# Patient Record
Sex: Female | Born: 1935 | Race: White | Hispanic: No | Smoking: Never smoker
Health system: Southern US, Community
[De-identification: ages and names within clinical notes are randomized; demographics above are authoritative.]

## PROBLEM LIST (undated history)

## (undated) DIAGNOSIS — M5136 Other intervertebral disc degeneration, lumbar region: Secondary | ICD-10-CM

## (undated) DIAGNOSIS — M51369 Other intervertebral disc degeneration, lumbar region without mention of lumbar back pain or lower extremity pain: Secondary | ICD-10-CM

## (undated) DIAGNOSIS — I10 Essential (primary) hypertension: Secondary | ICD-10-CM

## (undated) DIAGNOSIS — N840 Polyp of corpus uteri: Secondary | ICD-10-CM

## (undated) DIAGNOSIS — N841 Polyp of cervix uteri: Secondary | ICD-10-CM

## (undated) DIAGNOSIS — K5790 Diverticulosis of intestine, part unspecified, without perforation or abscess without bleeding: Secondary | ICD-10-CM

## (undated) DIAGNOSIS — Z860101 Personal history of adenomatous and serrated colon polyps: Secondary | ICD-10-CM

## (undated) DIAGNOSIS — Z86010 Personal history of colonic polyps: Secondary | ICD-10-CM

## (undated) DIAGNOSIS — E871 Hypo-osmolality and hyponatremia: Secondary | ICD-10-CM

## (undated) DIAGNOSIS — K219 Gastro-esophageal reflux disease without esophagitis: Secondary | ICD-10-CM

## (undated) DIAGNOSIS — E785 Hyperlipidemia, unspecified: Secondary | ICD-10-CM

## (undated) DIAGNOSIS — K449 Diaphragmatic hernia without obstruction or gangrene: Secondary | ICD-10-CM

## (undated) HISTORY — DX: Personal history of adenomatous and serrated colon polyps: Z86.0101

## (undated) HISTORY — DX: Essential (primary) hypertension: I10

## (undated) HISTORY — PX: TONSILLECTOMY: SUR1361

## (undated) HISTORY — PX: DILATION AND CURETTAGE OF UTERUS: SHX78

## (undated) HISTORY — DX: Other intervertebral disc degeneration, lumbar region without mention of lumbar back pain or lower extremity pain: M51.369

## (undated) HISTORY — DX: Diaphragmatic hernia without obstruction or gangrene: K44.9

## (undated) HISTORY — PX: HYSTEROSCOPY: SHX211

## (undated) HISTORY — DX: Hypo-osmolality and hyponatremia: E87.1

## (undated) HISTORY — DX: Polyp of cervix uteri: N84.1

## (undated) HISTORY — DX: Hyperlipidemia, unspecified: E78.5

## (undated) HISTORY — PX: CHOLECYSTECTOMY: SHX55

## (undated) HISTORY — DX: Diverticulosis of intestine, part unspecified, without perforation or abscess without bleeding: K57.90

## (undated) HISTORY — DX: Polyp of corpus uteri: N84.0

## (undated) HISTORY — DX: Personal history of colonic polyps: Z86.010

## (undated) HISTORY — DX: Other intervertebral disc degeneration, lumbar region: M51.36

## (undated) HISTORY — DX: Gastro-esophageal reflux disease without esophagitis: K21.9

---

## 1999-12-08 ENCOUNTER — Ambulatory Visit: Admission: RE | Admit: 1999-12-08 | Discharge: 1999-12-08 | Payer: Self-pay | Admitting: Internal Medicine

## 2000-03-21 ENCOUNTER — Encounter: Payer: Self-pay | Admitting: Internal Medicine

## 2000-03-21 ENCOUNTER — Encounter: Admission: RE | Admit: 2000-03-21 | Discharge: 2000-03-21 | Payer: Self-pay | Admitting: Internal Medicine

## 2000-10-07 ENCOUNTER — Encounter: Admission: RE | Admit: 2000-10-07 | Discharge: 2001-01-05 | Payer: Self-pay | Admitting: Internal Medicine

## 2001-05-02 ENCOUNTER — Encounter: Payer: Self-pay | Admitting: Internal Medicine

## 2001-05-02 ENCOUNTER — Encounter: Admission: RE | Admit: 2001-05-02 | Discharge: 2001-05-02 | Payer: Self-pay | Admitting: Internal Medicine

## 2001-10-15 ENCOUNTER — Other Ambulatory Visit: Admission: RE | Admit: 2001-10-15 | Discharge: 2001-10-15 | Payer: Self-pay | Admitting: Obstetrics and Gynecology

## 2002-07-15 ENCOUNTER — Encounter: Admission: RE | Admit: 2002-07-15 | Discharge: 2002-07-15 | Payer: Self-pay | Admitting: Family Medicine

## 2002-07-15 ENCOUNTER — Encounter: Payer: Self-pay | Admitting: Family Medicine

## 2002-12-10 ENCOUNTER — Other Ambulatory Visit: Admission: RE | Admit: 2002-12-10 | Discharge: 2002-12-10 | Payer: Self-pay | Admitting: Obstetrics and Gynecology

## 2003-07-16 ENCOUNTER — Encounter: Admission: RE | Admit: 2003-07-16 | Discharge: 2003-07-16 | Payer: Self-pay | Admitting: Obstetrics and Gynecology

## 2003-10-28 ENCOUNTER — Inpatient Hospital Stay (HOSPITAL_COMMUNITY): Admission: EM | Admit: 2003-10-28 | Discharge: 2003-11-01 | Payer: Self-pay | Admitting: Emergency Medicine

## 2004-01-25 ENCOUNTER — Other Ambulatory Visit: Admission: RE | Admit: 2004-01-25 | Discharge: 2004-01-25 | Payer: Self-pay | Admitting: Obstetrics and Gynecology

## 2004-05-23 ENCOUNTER — Ambulatory Visit (HOSPITAL_COMMUNITY): Admission: RE | Admit: 2004-05-23 | Discharge: 2004-05-23 | Payer: Self-pay | Admitting: Family Medicine

## 2004-08-23 ENCOUNTER — Encounter: Admission: RE | Admit: 2004-08-23 | Discharge: 2004-08-23 | Payer: Self-pay | Admitting: Family Medicine

## 2004-09-22 ENCOUNTER — Ambulatory Visit (HOSPITAL_COMMUNITY): Admission: RE | Admit: 2004-09-22 | Discharge: 2004-09-22 | Payer: Self-pay | Admitting: Family Medicine

## 2004-12-07 ENCOUNTER — Ambulatory Visit: Payer: Self-pay | Admitting: Internal Medicine

## 2004-12-21 ENCOUNTER — Ambulatory Visit: Payer: Self-pay | Admitting: Internal Medicine

## 2005-02-07 ENCOUNTER — Other Ambulatory Visit: Admission: RE | Admit: 2005-02-07 | Discharge: 2005-02-07 | Payer: Self-pay | Admitting: Obstetrics and Gynecology

## 2005-08-08 ENCOUNTER — Ambulatory Visit: Admission: RE | Admit: 2005-08-08 | Discharge: 2005-08-08 | Payer: Self-pay | Admitting: Family Medicine

## 2005-09-19 ENCOUNTER — Encounter: Admission: RE | Admit: 2005-09-19 | Discharge: 2005-09-19 | Payer: Self-pay | Admitting: Family Medicine

## 2006-02-12 ENCOUNTER — Other Ambulatory Visit: Admission: RE | Admit: 2006-02-12 | Discharge: 2006-02-12 | Payer: Self-pay | Admitting: Obstetrics and Gynecology

## 2007-07-16 ENCOUNTER — Encounter: Admission: RE | Admit: 2007-07-16 | Discharge: 2007-07-16 | Payer: Self-pay | Admitting: Family Medicine

## 2008-04-29 ENCOUNTER — Other Ambulatory Visit: Admission: RE | Admit: 2008-04-29 | Discharge: 2008-04-29 | Payer: Self-pay | Admitting: Obstetrics and Gynecology

## 2008-06-01 ENCOUNTER — Ambulatory Visit: Payer: Self-pay | Admitting: Obstetrics and Gynecology

## 2008-06-03 ENCOUNTER — Encounter: Payer: Self-pay | Admitting: Obstetrics and Gynecology

## 2008-06-03 ENCOUNTER — Ambulatory Visit (HOSPITAL_BASED_OUTPATIENT_CLINIC_OR_DEPARTMENT_OTHER): Admission: RE | Admit: 2008-06-03 | Discharge: 2008-06-03 | Payer: Self-pay | Admitting: Obstetrics and Gynecology

## 2008-06-10 ENCOUNTER — Ambulatory Visit: Payer: Self-pay | Admitting: Obstetrics and Gynecology

## 2008-08-17 ENCOUNTER — Encounter: Admission: RE | Admit: 2008-08-17 | Discharge: 2008-08-17 | Payer: Self-pay | Admitting: Obstetrics and Gynecology

## 2009-06-24 ENCOUNTER — Ambulatory Visit: Payer: Self-pay | Admitting: Obstetrics and Gynecology

## 2009-06-24 ENCOUNTER — Other Ambulatory Visit: Admission: RE | Admit: 2009-06-24 | Discharge: 2009-06-24 | Payer: Self-pay | Admitting: Obstetrics and Gynecology

## 2009-06-24 ENCOUNTER — Encounter: Payer: Self-pay | Admitting: Obstetrics and Gynecology

## 2009-06-27 ENCOUNTER — Ambulatory Visit: Payer: Self-pay | Admitting: Obstetrics and Gynecology

## 2009-08-25 ENCOUNTER — Encounter: Admission: RE | Admit: 2009-08-25 | Discharge: 2009-08-25 | Payer: Self-pay | Admitting: Family Medicine

## 2009-12-02 ENCOUNTER — Encounter (INDEPENDENT_AMBULATORY_CARE_PROVIDER_SITE_OTHER): Payer: Self-pay | Admitting: *Deleted

## 2009-12-19 ENCOUNTER — Encounter (INDEPENDENT_AMBULATORY_CARE_PROVIDER_SITE_OTHER): Payer: Self-pay | Admitting: *Deleted

## 2010-01-09 ENCOUNTER — Encounter (INDEPENDENT_AMBULATORY_CARE_PROVIDER_SITE_OTHER): Payer: Self-pay | Admitting: *Deleted

## 2010-01-11 ENCOUNTER — Ambulatory Visit: Payer: Self-pay | Admitting: Internal Medicine

## 2010-01-11 ENCOUNTER — Encounter (INDEPENDENT_AMBULATORY_CARE_PROVIDER_SITE_OTHER): Payer: Self-pay | Admitting: *Deleted

## 2010-01-25 ENCOUNTER — Ambulatory Visit: Payer: Self-pay | Admitting: Internal Medicine

## 2010-02-15 ENCOUNTER — Ambulatory Visit: Payer: Self-pay | Admitting: Obstetrics and Gynecology

## 2010-04-11 ENCOUNTER — Ambulatory Visit: Payer: Self-pay | Admitting: Obstetrics and Gynecology

## 2010-08-29 ENCOUNTER — Encounter
Admission: RE | Admit: 2010-08-29 | Discharge: 2010-08-29 | Payer: Self-pay | Source: Home / Self Care | Attending: Obstetrics and Gynecology | Admitting: Obstetrics and Gynecology

## 2010-10-08 ENCOUNTER — Encounter: Payer: Self-pay | Admitting: Family Medicine

## 2010-10-12 ENCOUNTER — Ambulatory Visit
Admission: RE | Admit: 2010-10-12 | Discharge: 2010-10-12 | Payer: Self-pay | Source: Home / Self Care | Attending: Obstetrics and Gynecology | Admitting: Obstetrics and Gynecology

## 2010-10-17 NOTE — Letter (Signed)
Summary: Diabetic Instructions  [facility]  [address] [place], [place] [address]   Phone: [phone]  Fax: [phone]    [name] [name] 12-24-1934 MRN: [id number]   _  _   ORAL DIABETIC MEDICATION INSTRUCTIONS  The day before your procedure:   Take your diabetic pill as you do normally  The day of your procedure:   Do not take your diabetic pill    We will check your blood sugar levels during the admission process and again in Recovery before discharging you home  ________________________________________________________________________

## 2010-10-17 NOTE — Miscellaneous (Signed)
Summary: [facility] PV  Clinical Lists Changes  Medications: Added new medication of MIRALAX   POWD (POLYETHYLENE GLYCOL 3350) As per prep  instructions. - Signed Added new medication of REGLAN 10 MG  TABS (METOCLOPRAMIDE HCL) As per prep instructions. - Signed Added new medication of DULCOLAX 5 MG  TBEC (BISACODYL) Day before procedure take 2 at 3pm and 2 at 8pm. - Signed Rx of MIRALAX   POWD (POLYETHYLENE GLYCOL 3350) As per prep  instructions.;  #255gm x 0;  Signed;  Entered by: Ezra Sites RN;  Authorized by: Hart Carwin MD;  Method used: [facility] [phone]*, [address], [place], [place], [place]  [address], Ph: [phone], Fax: [phone] Rx of REGLAN 10 MG  TABS (METOCLOPRAMIDE HCL) As per prep instructions.;  #2 x 0;  Signed;  Entered by: Ezra Sites RN;  Authorized by: Hart Carwin MD;  Method used: Electronically to [facility] [phone]*, [address], [place], [place], [place]  [address], Ph: [phone], Fax: [phone] Rx of DULCOLAX 5 MG  TBEC (BISACODYL) Day before procedure take 2 at 3pm and 2 at 8pm.;  #4 x 0;  Signed;  Entered by: Ezra Sites RN;  Authorized by: Hart Carwin MD;  Method used: Electronically to [facility] [phone]*, [address], [place], [place], [place]  [address], Ph: [phone], Fax: [phone] Allergies: Added new allergy or adverse reaction of PREDNISONE Observations: Added new observation of NKA: F (01/11/2010 13:39)    Prescriptions: DULCOLAX 5 MG  TBEC (BISACODYL) Day before procedure take 2 at 3pm and 2 at 8pm.  #4 x 0   Entered by:   Ezra Sites RN   Authorized by:   Hart Carwin MD   Signed by:   Ezra Sites RN on 01/11/2010   Method used:   Electronically to        [facility] [phone]* (retail)       [address]       [place], [place]  [address]       Ph: [phone]       Fax: [phone]   RxID:   [id number] REGLAN 10 MG  TABS  (METOCLOPRAMIDE HCL) As per prep instructions.  #2 x 0   Entered by:   Ezra Sites RN   Authorized by:   Hart Carwin MD   Signed by:   Ezra Sites RN on 01/11/2010   Method used:   Electronically to        [facility] [phone]* (retail)       [address]       [place], [place]  [address]       Ph: [phone]       Fax: [phone]   RxID:   [id number] MIRALAX   POWD (POLYETHYLENE GLYCOL 3350) As per prep  instructions.  #255gm x 0   Entered by:   Ezra Sites RN   Authorized by:   Hart Carwin MD   Signed by:   Ezra Sites RN on 01/11/2010   Method used:   Electronically to        [facility] [phone]* (retail)       [address]       [place], [place]  [address]       Ph: [phone]  Fax: [phone]   RxID:   [id number]

## 2010-10-17 NOTE — Letter (Signed)
Summary: [facility] Instructions  [facility]  [address] [place], [place] [address]   Phone: [phone]  Fax: [phone]       [name] [name]    10/22/1934    MRN: [id number]       Procedure Day Dorna Bloom: Wednesday 01/25/2010     Arrival Time: 8:30 am      Procedure Time: 9:30 am     Location of Procedure:                    _x _  [facility] (4th Floor)    PREPARATION FOR COLONOSCOPY WITH MIRALAX  Starting 5 days prior to your procedure Friday 5/6 do not eat nuts, seeds, popcorn, corn, beans, peas,  salads, or any raw vegetables.  Do not take any fiber supplements (e.g. Metamucil, Citrucel, and Benefiber). ____________________________________________________________________________________________________   THE DAY BEFORE YOUR PROCEDURE         DATE: Tuesday 5/10  1   Drink clear liquids the entire day-NO SOLID FOOD  2   Do not drink anything colored red or purple.  Avoid juices with pulp.  No orange juice.  3   Drink at least 64 oz. (8 glasses) of fluid/clear liquids during the day to prevent dehydration and help the prep work efficiently.  CLEAR LIQUIDS INCLUDE: Water Jello Ice Popsicles Tea (sugar ok, no milk/cream) Powdered fruit flavored drinks Coffee (sugar ok, no milk/cream) Gatorade Juice: apple, white grape, white cranberry  Lemonade Clear bullion, consomm, broth Carbonated beverages (any kind) Strained chicken noodle soup Hard Candy  4   Mix the entire bottle of Miralax with 64 oz. of Gatorade/Powerade in the morning and put in the refrigerator to chill.  5   At 3:00 pm take 2 Dulcolax/Bisacodyl tablets.  6   At 4:30 pm take one Reglan/Metoclopramide tablet.  7  Starting at 5:00 pm drink one 8 oz glass of the Miralax mixture every 15-20 minutes until you have finished drinking the entire 64 oz.  You should finish drinking prep around 7:30 or 8:00 pm.  8   If you are nauseated, you may take the 2nd Reglan/Metoclopramide  tablet at 6:30 pm.        9    At 8:00 pm take 2 more DULCOLAX/Bisacodyl tablets.     THE DAY OF YOUR PROCEDURE      DATE: Wednesday 5/11  You may drink clear liquids until 7:30 am   (2 HOURS BEFORE PROCEDURE).   MEDICATION INSTRUCTIONS  Unless otherwise instructed, you should take regular prescription medications with a small sip of water as early as possible the morning of your procedure.  Diabetic patients - see separate instructions.  Additional medication instructions: Do not take Bisoprolol/HCTZ  the day of your procedure.         OTHER INSTRUCTIONS  You will need a responsible adult at least 74 years of age to accompany you and drive you home.   This person must remain in the waiting room during your procedure.  Wear loose fitting clothing that is easily removed.  Leave jewelry and other valuables at home.  However, you may wish to bring a book to read or an iPod/MP3 player to listen to music as you wait for your procedure to start.  Remove all body piercing jewelry and leave at home.  Total time from sign-in until discharge is approximately 2-3 hours.  You should go home directly after your procedure and rest.  You can resume normal  activities the day after your procedure.  The day of your procedure you should not:   Drive   Make legal decisions   Operate machinery   Drink alcohol   Return to work  You will receive specific instructions about eating, activities and medications before you leave.   The above instructions have been reviewed and explained to me by  Ezra Sites RN  January 11, 2010 2:05 PM     I fully understand and can verbalize these instructions _____________________________ Date _______

## 2010-10-17 NOTE — Letter (Signed)
Summary: Colonoscopy Letter  [facility]  [address] [place], [place] [address]   Phone: [phone]  Fax: [phone]      December 02, 2009 MRN: [id number]   [name] [name] [address] [place], [place]  [address]   Dear Ms. [facility],   According to your medical record, it is time for you to schedule a Colonoscopy. The American Cancer Society recommends this procedure as a method to detect early colon cancer. Patients with a family history of colon cancer, or a personal history of colon polyps or inflammatory bowel disease are at increased risk.  This letter has beeen generated based on the recommendations made at the time of your procedure. If you feel that in your particular situation this may no longer apply, please contact our office.  Please call our office at [phone] to schedule this appointment or to update your records at your earliest convenience.  Thank you for cooperating with [place] to provide you with the very best care possible.   Sincerely,  Hedwig Morton. Juanda Chance, M.D.  [facility] Gastroenterology Division [phone]

## 2010-10-17 NOTE — Letter (Signed)
Summary: Previsit letter  [facility]  [address] [place], [place] [address]   Phone: [phone]  Fax: [phone]       12/19/2009 MRN: [id number]  [name] [name] [address] [place], [place]  [address]  Dear Ms. [facility],  Welcome to the Gastroenterology Division at [facility].    You are scheduled to see a nurse for your pre-procedure visit on 01-11-10 at [facility] on the 3rd floor at [facility], [address]  We ask that you try to arrive at our office 15 minutes prior to your appointment time to allow for check-in.  Your nurse visit will consist of discussing your medical and surgical history, your immediate family medical history, and your medications.    Please bring a complete list of all your medications or, if you prefer, bring the medication bottles and we will list them.  We will need to be aware of both prescribed and over the counter drugs.  We will need to know exact dosage information as well.  If you are on blood thinners (Coumadin, Plavix, Aggrenox, Ticlid, etc.) please call our office today/prior to your appointment, as we need to consult with your physician about holding your medication.   Please be prepared to read and sign documents such as consent forms, a financial agreement, and acknowledgement forms.  If necessary, and with your consent, a friend or relative is welcome to sit-in on the nurse visit with you.  Please bring your insurance card so that we may make a copy of it.  If your insurance requires a referral to see a specialist, please bring your referral form from your primary care physician.  No co-pay is required for this nurse visit.     If you cannot keep your appointment, please call [phone] to cancel or reschedule prior to your appointment date.  This allows [place] the opportunity to schedule an appointment for another patient in need of care.    Thank you for choosing [facility] for your medical needs.  We  appreciate the opportunity to care for you.  Please visit [place] at our website  to learn more about our practice.                     Sincerely.                                                                                                                   The Gastroenterology Division

## 2010-10-17 NOTE — Procedures (Signed)
Summary: Colonoscopy  Patient: [name] Note: All result statuses are Final unless otherwise noted.  Tests: (1) Colonoscopy (COL)   COL Colonoscopy           DONE     [facility]     [address]     [place], [place]  [address]           COLONOSCOPY PROCEDURE REPORT           PATIENT:  [name], [name]  MR#:  [id number]     BIRTHDATE:  27-Feb-1935, 74 yrs. old  GENDER:  female     ENDOSCOPIST:  Hedwig Morton. Juanda Chance, MD     REF. BY:  Holley Bouche, M.D.     PROCEDURE DATE:  01/25/2010     PROCEDURE:  Colonoscopy 19[address]     ASA CLASS:  Class I     INDICATIONS:  surveillance prior colon. [id number], tubular     adenomas     MEDICATIONS:   Versed 5 mg, Fentanyl 50 mcg           DESCRIPTION OF PROCEDURE:   After the risks benefits and     alternatives of the procedure were thoroughly explained, informed     consent was obtained.  Digital rectal exam was performed and     revealed no rectal masses.   The LB [id number]-[id number] endoscope     was introduced through the anus and advanced to the cecum, which     was identified by both the appendix and ileocecal valve, without     limitations.  The quality of the prep was adequate, using MiraLax.     The instrument was then slowly withdrawn as the colon was fully     examined.     <<PROCEDUREIMAGES>>           FINDINGS:  Moderate diverticulosis was found throughout the colon     (see image2, image9, and image8).  This was otherwise a normal     examination of the colon (see image10, image5, image4, and     image6).   Retroflexed views in the rectum revealed no     abnormalities.    The scope was then withdrawn from the patient     and the procedure completed.           COMPLICATIONS:  None     ENDOSCOPIC IMPRESSION:     1) Moderate diverticulosis throughout the colon     2) Otherwise normal examination     RECOMMENDATIONS:     1) high fiber diet     fiber supplements daily     REPEAT EXAM:  In 10 year(s) for.       ______________________________     Hedwig Morton. Juanda Chance, MD           CC:           n.     eSIGNED:   Hedwig Morton. Brodie at 01/25/2010 10:08 AM           Celso Amy, [id number]  Note: An exclamation mark (!) indicates a result that was not dispersed into the flowsheet. Document Creation Date: 01/25/2010 10:09 AM _______________________________________________________________________  (1) Order result status: Final Collection or observation date-time: 01/25/2010 10:02 Requested date-time:  Receipt date-time:  Reported date-time:  Referring Physician:   Ordering Physician: Lina Sar [phone]) Specimen Source:  Source: Launa Grill Order Number: [id number] Lab site:   Appended Document: Colonoscopy    Clinical Lists Changes  Observations: Added new observation of COLONNXTDUE: 01/2020 (01/25/2010 14:31)

## 2010-12-05 LAB — GLUCOSE, CAPILLARY: Glucose-Capillary: 205 mg/dL — ABNORMAL HIGH (ref 70–99)

## 2011-01-30 NOTE — Op Note (Signed)
[name] [name], [name] [name]             ACCOUNT NO.:  [id number]   MEDICAL RECORD NO.:  [id number]          PATIENT TYPE:  AMB   LOCATION:  [facility]                         FACILITY:  [facility]   PHYSICIAN:  Daniel L. Gottsegen, M.D.DATE OF BIRTH:  July 07, 1935   DATE OF PROCEDURE:  06/03/2008  DATE OF DISCHARGE:                               OPERATIVE REPORT   PREOPERATIVE DIAGNOSES:  Abnormal Pap smear with endometrial cells  present.   POSTOPERATIVE DIAGNOSES:  Abnormal Pap smear with endometrial cells  present plus endocervical polyp and endometrial polyp.   OPERATION:  Hysteroscopy, endometrial biopsy excision of endocervical  and endometrial polyp, endocervical curettage.   SURGEON:  Daniel L. Eda Paschal, M.D.   ANESTHESIA:  General.   INDICATIONS:  The patient is a 76 year old female who came to the office  for her annual exam and had a Pap smear showing endometrial cells.  She  is postmenopausal.  She has had no bleeding.  She had cervical stenosis,  so she could not be evaluated in the office, but this was obviously an  abnormal finding.  She now enters the hospital for hysteroscopy,  endometrial and endocervical sampling and appropriate surgery.   Please note that her electrocardiogram this morning was not normal.  However, the anesthesiologist thought it was safe to give her general  anesthesia, it was faxed to Holley Bouche, M.D.  His office was called  and he will see whether it is any different from previous  electrocardiograms.   FINDINGS:  External is normal,  BUS is normal.  Vaginal is normal.  Cervix is clean.  Uterus is normal size and shape.  Adnexa failed to  reveal masses.  At the time of hysteroscopy, the patient had a well-  defined endocervical polyp coming off the anterior wall of the  endocervix and a endometrial polyp coming off the left lower wall of the  endometrium.  The endometrial polyp was about 1.5 cm.  The endocervical  polyp was little bit less than a  centimeter.  Other than this, tubal  ostia, top of the fundus, anterior and posterior walls of the fundus,  lower uterine segment and endocervical canal were free of other disease.   PROCEDURE:  After adequate general anesthesia the patient was placed in  the dorsal lithotomy position, prepped and draped in usual sterile  manner.  A single-tooth tenaculum was placed on the anterior lip of the  cervix and the cervix was dilated to a #31 Pratt dilator.  A  hysteroscopic resectoscope was introduced, 3% sorbitol was used to  expand the intrauterine cavity and a camera was used for magnification.  The first thing encountered was an endocervical polyp which could be  excised with a 90 degrees wire loop with appropriate Bovie settings.  The endometrial curettings were obtained, endocervical curettings were  obtained and then when the endometrium had  been entered and the endometrial polyp was encountered which was also  excised with the 90 degrees wire loop.  There was no bleeding noted.  There was no fluid deficit.  The procedure was terminated.  The patient  left the operating room  in satisfactory condition.      Daniel L. Eda Paschal, M.D.  Electronically Signed     DLG/MEDQ  D:  06/03/2008  T:  06/04/2008  Job:  [id number]

## 2011-02-02 NOTE — Discharge Summary (Signed)
NAMECLOYCE, [name] [name]                       ACCOUNT NO.:  [id number]   MEDICAL RECORD NO.:  [id number]                   PATIENT TYPE:  INP   LOCATION:  3729                                 FACILITY:  [facility]   PHYSICIAN:  Kela Millin, M.D.             DATE OF BIRTH:  October 11, 1934   DATE OF ADMISSION:  10/28/2003  DATE OF DISCHARGE:  11/01/2003                                 DISCHARGE SUMMARY   DISCHARGE DIAGNOSES:  1. Chest pain, non-cardiac, myocardial infarction ruled out.  Likely     secondary to gastroesophageal reflux disease.  2. Diabetes mellitus.  3. Dyslipidemia.  4. Hypertension.  5. Gastroesophageal reflux disease.   PROCEDURES/STUDIES:  Stress Cardiolite, negative.  No ischemic changes.   CONSULTATIONS:  Cardiology, Dr. Fraser Din.   HISTORY:  The patient is a 76 year old white female who presented with  complaints of substernal aching chest pain which seemed to radiate across  the left chest to her left arm.  She stated that the pain began at bedtime  and persisted all night and kept her from sleeping.  There was no associated  shortness of breath, diaphoresis, or nausea.  She tried to go to work, but  was too uncomfortable, and therefore presented to Dr. Rennie Plowman office.  After evaluation, she was referred to [facility].  She was  then admitted for further evaluation.  She stated she had not had any  similar episodes in the past, also admitted that she was very physically  inactive.  She also stated that she had an exercise stress test done in the  distant past, and that she was very anxious and afraid about walking on  treadmills.  Her physical examination was within normal limits.  Her cardiac  enzymes were negative, and also her chemistries were within normal limits,  as well as her H&H.  Her EKG showed sinus rhythm with normal R-wave  progression, and there were no acute ischemic changes.  A fasting lipid  profile revealed an LDL of 109, HDL  was 42, triglycerides were 198, and her  total cholesterol was 191.   HOSPITAL COURSE:  #1 -  CHEST PAIN:  The patient was admitted to telemetry  unit and was started on aspirin as well as Lovenox 1 mg/kg, Lopressor, and  nitroglycerin, as well as Altace.  Serial cardiac enzymes were done and as  stated above were negative.  Cardiology was consulted, Dr. Fraser Din, and she  recommended that the patient's dyslipidemia be treated with a goal LDL of  70, and the patient was started on Lipitor.  It was noted that the patient  did not want to be on an anti-lipid agent because she had been advised in  the past of the possible side effects, I discussed the benefits versus the  side effects with her, and upon discharge gave her the prescription and she  was going to consider continuing the Lipitor.  Dr.  Fraser Din also did a stress  Cardiolite and the results are stated above.  #2 -  DYSLIPIDEMIA:  As discussed above.  The patient was also educated on a  low fat, low cholesterol diet.  #3 -  HYPERTENSION:  The patient was treated with Altace while in the  hospital, and upon discharge was to resume her outpatient medications.  #4 -  DIABETES MELLITUS:  Her Accu-Cheks were monitored and she was followed  with sliding scale on admission.  She was on Metformin as an outpatient, but  this was held as it was anticipated that she was going to have a Cardiolite  stress test done.  The Metformin was resumed after her stress test upon  discharge.   DISCHARGE MEDICATIONS:  1. The patient is to continue pre-admission medications.  2. Prevacid increased to 30 mg p.o. b.i.d.  3. Lipitor 20 mg one daily.  4. Continue enteric coated aspirin 81 mg daily.   DISCHARGE DIET:  Diabetic, low fat, low cholesterol.   DISCHARGE CONDITION:  Improved.   FOLLOWUP:  The patient is to follow up with Dr. Dayton Scrape in one to two weeks.                                                Kela Millin, M.D.    ACV/MEDQ  D:   11/13/2003  T:  11/13/2003  Job:  [id number]   cc:   Meredith Staggers, M.D.  [address]  [place]  [place] [address]  Fax: [phone]

## 2011-02-02 NOTE — H&P (Signed)
NAME:  [name], [name]                       ACCOUNT NO.:  [id number]   MEDICAL RECORD NO.:  [id number]                   PATIENT TYPE:  EMS   LOCATION:  MINO                                 FACILITY:  [facility]   PHYSICIAN:  Sherin Quarry, MD                   DATE OF BIRTH:  December 17, 1934   DATE OF ADMISSION:  10/28/2003  DATE OF DISCHARGE:                                HISTORY & PHYSICAL   HISTORY OF PRESENT ILLNESS:  [name] [name] is a very nice 76 year old lady  who reports that yesterday evening at the time of bed time she began to  experience substernal chest aching which seemed to radiate across the left  chest to the left arm.  This pain persisted all night and prevented sleep.  There was no associated shortness of breath, diaphoresis, or nausea.  She  tried to go to work but was too uncomfortable, and therefore presented to  Dr. Janan Ridge office.  After evaluation, she was referred to  [facility].   She is admitted for further evaluation of persistent chest pain.  The  patient states she has not had any similar episodes in the past.  She is  very physically inactive.  She recalls that in the distance past she had an  exercise stress test that was negative. She reports that she is very anxious  and afraid and walking on treadmills.   ALLERGIES:  She is allergic to PREDNISONE.   CURRENT MEDICATIONS:  1. Prevacid 30 me q.d.  2. Glipizide 1 tablet q.d.  3. Metformin b.i.d.  4. Aspirin 81 mg q.d.  5. Meclizine p.r.n.  6. Blood pressure pill.   PAST MEDICAL HISTORY:  1. The patient reports that she has a history of well controlled diabetes.     She is very compliant with her medication.  She does not adhere to her     diet closely.  2. Hypertension. She reports this is well regulated.  There is no history of     stroke.  3. Gastroesophageal reflux disease.  The patient reports that she has     chronic acid reflux which has been  a problem in the past and for which     she takes Prevacid on a daily basis.   PAST SURGICAL HISTORY:  1. Colonoscopy in 1998 and again in 1999 for colon polyps.  2. She has also had a cholecystectomy and tonsillectomy.   FAMILY HISTORY:  Her mother died suddenly of a stroke at age 42.  Her mother  had a history of hypertension.  Her father died of alcohol-related problems.  He ultimately committed suicide. She has two sisters who have a history of  diabetes.   SOCIAL HISTORY:  The patient lives alone.  She does not smoke or drink.  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   REVIEW  OF SYMPTOMS:  She denies headache or vision loss.  She denies visual  blurring or diplopia.  EARS/NOSE/THROAT:  Denies earaches, sinus pain, or  sore throat.  CHEST:  Denies coughing, wheezing, or chest congestion.  CARDIOVASCULAR:  Denies orthopnea, PND, ankle edema, or exertional chest  pain.  GASTROINTESTINAL:  Denies hematemesis or melena.  GENITOURINARY:  Denies dysuria or urinary frequency.  RHEUMATOLOGIC:  Denies back pain or  joint pain.  HEMATOLOGIC:  Denies easy bleeding or bruising.  NEUROLOGIC:  Denies history of seizure or stroke.   PHYSICAL EXAMINATION:  VITAL SIGNS:  Temperature is 97.5, pulse is 76,  respirations 20.  O2 saturation are 97%.  HEENT:  Within normal limits.  CHEST:  Clear to auscultation and percussion.  CARDIOVASCULAR:  Normal S1 and S2 without rubs, murmurs, or gallops.  ABDOMEN:  Within normal limits without masses or tenderness.  There is no  guarding or rebound.  NEUROLOGICAL:  Examination of extremities is normal.   LABORATORY DATA:  Cardiac enzymes are negative x 3.  Sodium is 137,  potassium is 4.2, glucose is 116.  Hemoglobin is 13.2.   Chest x-ray is reported to be within normal limits.   IMPRESSION:  1. Chest pain, rule out myocardial infarction.  2. History of severe gastroesophageal reflux Prevacid therapy.  3. Diabetes.  4.  Hypertension.  5. Obesity.  6. History of colon polyps.  7. Status post cholecystectomy.   PLAN:  The patient will be admitted to rule out myocardial infarction.  A  cardiology consult will be requested in the morning for the purpose of  conducting a stress test.  This will probably need to be an Adenosine-  Cardiolite study.  We will continue her current treatment.  It is possible  that her symptoms might be either secondary to acid reflux or possibly  secondary to a musculoskeletal origin.                                                Sherin Quarry, MD    SY/MEDQ  D:  10/28/2003  T:  10/28/2003  Job:  [id number]   cc:   Meredith Staggers, M.D.  [address]  [place]  [place] [address]  Fax: [phone]   [facility]

## 2011-02-02 NOTE — Consult Note (Signed)
NAMEROSALYND, MCWRIGHT                       ACCOUNT NO.:  [id number]   MEDICAL RECORD NO.:  [id number]                   PATIENT TYPE:  INP   LOCATION:  3729                                 FACILITY:  [facility]   PHYSICIAN:  Meade Maw, M.D.                 DATE OF BIRTH:  12/27/1934   DATE OF CONSULTATION:  10/29/2003  DATE OF DISCHARGE:                                   CONSULTATION   REFERRING:  Leanne Chang, M.D.   REASON FOR CONSULTATION:  Chest pain.   HISTORY:  Sundee is a very-pleasant 76 year old female who first noted an  episode of chest pain on Wednesday night.  This started at bedtime.  He was  described as an aching sensation which radiated to her left chest and left  arm.  She was noted to have left-arm heaviness.  This persisted for two to  three hours.  The patient finally sat upright in a chair and was able to  obtain two to three hours of sleep.  She attempted to go to work the next  day.  The left arm numbness persisted.  She presented to Dr. Rennie Plowman office  who recommended further evaluation in the ER.  She has had similar episodes  of chest pain in the past.  She has had left-arm numbness for approximately  three months.  She does report an exercise stress test approximately 15  years prior.  She had a bad experience with her stress test and is very  anxious requiring treadmills.   MEDICATIONS AT TIME OF ADMISSION:  1. Prevacid 30 mg daily.  2. Glipizide one tablet daily.  3. Metformin b.i.d.  4. Aspirin 81 mg daily.  5. Meclizine p.r.n.  6. Blood pressure medication.   CURRENT MEDICATIONS:  1. Humulin insulin.  2. Lopressor 25 mg b.i.d.  3. Protonix 40 mg daily.  4. Altace 2.5 mg daily.  5. Lovenox 100 mg subcu q.12h.  6. Aspirin 162 mg daily.  7. Tylenol p.r.n.  8. Sublingual nitroglycerin.  9. Ativan p.r.n.   PAST SURGICAL HISTORY:  Significant for a colonoscopy in 1998,  cholecystectomy, tonsillectomy.   PAST MEDICAL HISTORY:  1.  Hypertension.  2. Diabetes mellitus x5 years.  3. Reflux disease.   FAMILY HISTORY:  Significant for mother with a stroke at age 6.  Mother  with hypertension prior to her stroke.  Father had recently died from  suicide.  Two sisters, one who had passed from cerebrovascular accident.   SOCIAL HISTORY:  She lives alone.  There is no history of tobacco or alcohol  use.  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   REVIEW OF SYSTEMS:  She has had no palpitations, no presyncope, no syncope,  no orthopnea, no pedal edema, no upper respiratory infections, no blood in  the stool.   PHYSICAL EXAMINATION:  GENERAL:  An elderly female, appearing older than her  stated age, in no acute distress.  VITAL SIGNS:  Temperature 99.5, blood pressure 150/82, O2 saturations 96%.  Heart rate ranges in the 70s to 80s.  Telemetry has revealed sinus rhythm  without significant arrhythmias.  PULMONARY:  Exam reveals breath sounds which are equal and clear to  auscultation.  No use of accessory muscles.  CARDIOVASCULAR:  Exam reveals a normal S1, normal S2, no rubs, murmurs or  gallops noted.  ABDOMEN:  Soft, benign, nontender.  EXTREMITIES:  No peripheral edema.  SKIN:  Warm and dry.   LABORATORY DATA:  Her LDL is 109, HDL is 42, triglycerides 191.  CK 182, CK-  MB 2.6, relative index 1.4.  Troponin I is 0.01.  White count 5.4.  Hemoglobin 12.8, hematocrit 36, platelets count 278,000.  INR 1.  ECG  reveals a sinus rhythm, normal R wave progression.  No acute ischemic  changes.   IMPRESSION:  21. A 76 year old female with chest pain.  The chest pain has typical and     atypical features.  The left arm numbness has been persistent for     approximately three months.  She has had recurrent chest pain while in     the hospital.  Serial cardiac enzymes have been negative.  No evidence of     ischemia on her ECG.  We will need to schedule for a stress Cardiolite as     an inpatient.  May need  to switch to an adenosine Cardiolite.  2. Dyslipidemia.  LDL is 109.  We will treat for an LDL of 70 in view of her     diabetes and hypertension.  3. Hypertension.  Blood pressure is not well controlled.  Will increase her     Altace to 5 mg daily.                                               Meade Maw, M.D.    HP/MEDQ  D:  10/29/2003  T:  10/30/2003  Job:  [id number]

## 2011-06-18 LAB — GLUCOSE, CAPILLARY: Glucose-Capillary: 213 — ABNORMAL HIGH

## 2011-07-23 ENCOUNTER — Other Ambulatory Visit: Payer: Self-pay | Admitting: Obstetrics and Gynecology

## 2011-07-23 DIAGNOSIS — Z1231 Encounter for screening mammogram for malignant neoplasm of breast: Secondary | ICD-10-CM

## 2011-09-04 ENCOUNTER — Ambulatory Visit
Admission: RE | Admit: 2011-09-04 | Discharge: 2011-09-04 | Disposition: A | Discharge status: Home / Self Care | Payer: Medicare Other | Source: Ambulatory Visit | Attending: Obstetrics and Gynecology | Admitting: Obstetrics and Gynecology

## 2011-09-04 DIAGNOSIS — Z1231 Encounter for screening mammogram for malignant neoplasm of breast: Secondary | ICD-10-CM

## 2012-05-14 ENCOUNTER — Encounter: Payer: Self-pay | Admitting: Gynecology

## 2012-05-14 DIAGNOSIS — E119 Type 2 diabetes mellitus without complications: Secondary | ICD-10-CM | POA: Insufficient documentation

## 2012-05-14 DIAGNOSIS — N840 Polyp of corpus uteri: Secondary | ICD-10-CM | POA: Insufficient documentation

## 2012-05-14 DIAGNOSIS — G4730 Sleep apnea, unspecified: Secondary | ICD-10-CM | POA: Insufficient documentation

## 2012-05-14 DIAGNOSIS — N841 Polyp of cervix uteri: Secondary | ICD-10-CM | POA: Insufficient documentation

## 2012-05-15 ENCOUNTER — Encounter: Payer: Self-pay | Admitting: Obstetrics and Gynecology

## 2012-05-15 ENCOUNTER — Other Ambulatory Visit (HOSPITAL_COMMUNITY)
Admission: RE | Admit: 2012-05-15 | Discharge: 2012-05-15 | Disposition: A | Discharge status: Home / Self Care | Payer: Medicare Other | Source: Ambulatory Visit | Attending: Obstetrics and Gynecology | Admitting: Obstetrics and Gynecology

## 2012-05-15 ENCOUNTER — Ambulatory Visit (INDEPENDENT_AMBULATORY_CARE_PROVIDER_SITE_OTHER): Payer: Medicare Other | Admitting: Obstetrics and Gynecology

## 2012-05-15 VITALS — BP 126/82 | Ht 62.0 in | Wt 182.0 lb

## 2012-05-15 DIAGNOSIS — N816 Rectocele: Secondary | ICD-10-CM

## 2012-05-15 DIAGNOSIS — Z124 Encounter for screening for malignant neoplasm of cervix: Secondary | ICD-10-CM

## 2012-05-15 DIAGNOSIS — N9089 Other specified noninflammatory disorders of vulva and perineum: Secondary | ICD-10-CM

## 2012-05-15 DIAGNOSIS — Z01419 Encounter for gynecological examination (general) (routine) without abnormal findings: Secondary | ICD-10-CM | POA: Insufficient documentation

## 2012-05-15 DIAGNOSIS — N952 Postmenopausal atrophic vaginitis: Secondary | ICD-10-CM

## 2012-05-15 DIAGNOSIS — R3915 Urgency of urination: Secondary | ICD-10-CM

## 2012-05-15 DIAGNOSIS — R350 Frequency of micturition: Secondary | ICD-10-CM

## 2012-05-15 NOTE — Progress Notes (Signed)
Patient came back to see me today for further followup. She continues to have urinary frequency with nocturia x3. She also gets urgency of urination. She does not have urinary stress incontinence. She currently does not have dysuria. She had a normal mammogram this year. She has had several normal bone densities with the last one in 2009. She is doing well now without hormone replacement therapy. She does not have hot flashes. She is not aware of vaginal dryness. She does have atrophic vaginitis. She is not sexually active. She is having no vaginal bleeding or pelvic pain. She did have previous endometrial polyps and we removed in 2009. She has had no bleeding since then. She has always had normal Pap smears. Her last Pap smear was 2010. She is aware of a small growth on her left labia which she feels has increased in size since her last visit. We have also been watching her with a small rectocele which she is asymptomatic.  ROS: 12 system review done. Pertinent positives above. Other positives include sleep apnea, diabetes mellitus.  Physical examination:Kim Julian Reil present. HEENT within normal limits. Neck: Thyroid not large. No masses. Supraclavicular nodes: not enlarged. Breasts: Examined in both sitting and lying  position. No skin changes and no masses. Abdomen: Soft no guarding rebound or masses or hernia. Pelvic: External: Within normal limits except for 2 mm nodule on left labia unchanged from previous exam and somewhat from. Most consistent with folliculitis. BUS: Within normal limits. Vaginal:within normal limits. Poor  estrogen effect. No evidence of cystocele  or enterocele. First-degree rectocele.  Cervix: clean. Uterus: Normal size and shape. Adnexa: No masses. Rectovaginal exam: Confirmatory and negative. Extremities: Within normal limits.  Assessment: #1. Benign stable lesion of left labia #2. Atrophic vaginitis #3. Small rectocele #4. Mild detrussor instability  Plan: Continue yearly  mammograms. Reassured about labial lesion. Call me if it increases in size. Offered  patient medicine for detrussor instability. She declined. The new Pap smear guidelines were discussed with the patient. Patient requested pap which was done.

## 2012-05-15 NOTE — Patient Instructions (Signed)
Continue yearly mammograms 

## 2012-05-26 ENCOUNTER — Encounter: Payer: Self-pay | Admitting: Obstetrics and Gynecology

## 2012-07-29 ENCOUNTER — Other Ambulatory Visit: Payer: Self-pay | Admitting: Family Medicine

## 2012-07-29 DIAGNOSIS — R109 Unspecified abdominal pain: Secondary | ICD-10-CM

## 2012-08-04 ENCOUNTER — Ambulatory Visit
Admission: RE | Admit: 2012-08-04 | Discharge: 2012-08-04 | Disposition: A | Discharge status: Home / Self Care | Payer: Medicare Other | Source: Ambulatory Visit | Attending: Family Medicine | Admitting: Family Medicine

## 2012-08-04 DIAGNOSIS — R109 Unspecified abdominal pain: Secondary | ICD-10-CM

## 2012-08-04 MED ORDER — IOHEXOL 300 MG/ML  SOLN
100.0000 mL | Freq: Once | INTRAMUSCULAR | Status: AC | PRN
  Administered 2012-08-04: 100 mL via INTRAVENOUS

## 2012-08-05 ENCOUNTER — Telehealth: Payer: Self-pay | Admitting: Internal Medicine

## 2012-08-05 ENCOUNTER — Other Ambulatory Visit: Payer: Self-pay | Admitting: Obstetrics and Gynecology

## 2012-08-05 DIAGNOSIS — Z1231 Encounter for screening mammogram for malignant neoplasm of breast: Secondary | ICD-10-CM

## 2012-08-05 NOTE — Telephone Encounter (Signed)
Spoke with Jerene Dilling and scheduled patient on 08/08/12 at 2:15/2:30 PM with Dr. Juanda Chance. Jerene Dilling will fax patient records (Dr. Noberto Retort)

## 2012-08-06 ENCOUNTER — Encounter: Payer: Self-pay | Admitting: *Deleted

## 2012-08-08 ENCOUNTER — Other Ambulatory Visit (INDEPENDENT_AMBULATORY_CARE_PROVIDER_SITE_OTHER): Payer: Medicare Other

## 2012-08-08 ENCOUNTER — Encounter: Payer: Self-pay | Admitting: Internal Medicine

## 2012-08-08 ENCOUNTER — Ambulatory Visit (INDEPENDENT_AMBULATORY_CARE_PROVIDER_SITE_OTHER): Payer: Medicare Other | Admitting: Internal Medicine

## 2012-08-08 VITALS — BP 158/90 | HR 80 | Ht 61.5 in | Wt 180.4 lb

## 2012-08-08 DIAGNOSIS — R933 Abnormal findings on diagnostic imaging of other parts of digestive tract: Secondary | ICD-10-CM

## 2012-08-08 DIAGNOSIS — R109 Unspecified abdominal pain: Secondary | ICD-10-CM

## 2012-08-08 LAB — FERRITIN: Ferritin: 12.5 ng/mL (ref 10.0–291.0)

## 2012-08-08 LAB — PROTIME-INR: INR: 1 ratio (ref 0.8–1.0)

## 2012-08-08 MED ORDER — CIPROFLOXACIN HCL 250 MG PO TABS: 250.0000 mg | ORAL_TABLET | Freq: Two times a day (BID) | ORAL | Status: DC

## 2012-08-08 MED ORDER — METRONIDAZOLE 250 MG PO TABS: 250.0000 mg | ORAL_TABLET | Freq: Three times a day (TID) | ORAL | Status: DC

## 2012-08-08 MED ORDER — DICYCLOMINE HCL 20 MG PO TABS: 20.0000 mg | ORAL_TABLET | Freq: Two times a day (BID) | ORAL | Status: DC

## 2012-08-08 NOTE — Progress Notes (Signed)
Baelynn Schmuhl Mallon December 14, 1934 MRN [id number]   History of Present Illness:  This is a 77 year old, white female with a 2 months history of left-sided abdominal pain which started gradually. It bothers her at night as well as during the day. She has  had frequent bowel movements since her cholecystectomy 40 years ago. She denies any bleeding. She had an open cholecystectomy in the 1980s. Her last colonoscopy in May 2011  showed moderately severe diverticulosis of the left colon. Prior colonoscopies in 1997, 2001 and 2006 showed a tubular adenoma. A recent CT scan of the abdomen and pelvis for evaluation of left lower quadrant abdominal pain showed presence of diverticuli, a small ventral hernia containing fat, atrophic pancreas and prominent liver with mildly nodular surface. There was no sign of portal hypertension, although the splenic size was prominent. She denies ever drinking alcohol in excess. She denies indigestion or gastroesophageal reflux. She takes Prevacid 30 mg daily. She has been trying to lose weight.   Past Medical History  Diagnosis Date  . Endometrial polyp   . Endocervical polyp   . Diabetes mellitus   . Diverticulosis   . Hyponatremia   . Hypertension   . Hyperlipidemia   . GERD (gastroesophageal reflux disease)   . DDD (degenerative disc disease), lumbar   . Hiatal hernia   . Hx of adenomatous colonic polyps    Past Surgical History  Procedure Date  . Cholecystectomy   . Dilation and curettage of uterus   . Hysteroscopy   . Tonsillectomy     reports that she has never smoked. She has never used smokeless tobacco. She reports that she does not drink alcohol or use illicit drugs. family history includes Alcohol abuse in her father; Breast cancer in her cousin; Diabetes in her sister; Hypertension in her mother; Lung cancer in her brother; and Stroke in her mother. Allergies  Allergen Reactions  . Prednisone     REACTION: lethargic        Review of Systems:  Negative for dysphagia, odynophagia rectal bleeding  The remainder of the 10 point ROS is negative except as outlined in H&P   Physical Exam: General appearance  Well developed, in no distress. Mildly overweight Eyes- non icteric. HEENT nontraumatic, normocephalic. Mouth no lesions, tongue papillated, no cheilosis. Neck supple without adenopathy, thyroid not enlarged, no carotid bruits, no JVD. Lungs Clear to auscultation bilaterally. Cor normal S1, normal S2, regular rhythm, no murmur,  quiet precordium. Abdomen: Tubular and soft with a well-healed right upper quadrant abdominal scar. Marked tenderness along left colon from left lower to left upper quadrant. No rebound. No fullness. Rectal: Soft Hemoccult negative stool. Extremities trace pedal edema. Skin no lesions. Neurological alert and oriented x 3. Psychological normal mood and affect.  Assessment and Plan:  Problem #1 Abdominal pain in the distribution of the left colon most likely correlating with known diverticulosis. There is no evidence for diverticulitis on a recent CT scan. I believe she has some inflammatory changes in her left colon and could benefit from a ten-day course of antibiotics which would include Flagyl 250 3 times a day and Cipro 250 twice a day. We will also start her on Bentyl 20 mg twice a day for an antispasmodic affect. She is up-to-date on her colonoscopy.  Problem #2 Abnormal appearance of the liver on a recent CT scan. This is an incidental finding. Her liver function tests recently were completely normal. We will check her hepatitis serologies, ANA titer, ferritin, AMA titer  as well as anti-smooth muscle antibody and alpha-1 trypsin, prothrombin time. The CT scan does not show any evidence of portal hypertension. There there are any questions, we will do upper endoscopy to rule out esophageal varices. We may also consider repeat imaging of the liver with ultrasound in 3 months. Her spleen is prominent but  not enlarged. She may possibly have fatty liver..   08/08/2012 Lina Sar

## 2012-08-08 NOTE — Patient Instructions (Addendum)
We have sent the following medications to your pharmacy for you to pick up at your convenience: Bentyl Flagyl Cipro  Your physician has requested that you go to the basement for the following lab work before leaving today: Hepatitis B Serologies, Hepatitis C Serologies, ANA, AMA, Ferritin, PT, Alpha 1 Antitrypsin, Anti Smooth Muscle antibody  CC: Dr Johny Blamer

## 2012-08-09 LAB — HEPATITIS C ANTIBODY: HCV Ab: NEGATIVE

## 2012-08-11 ENCOUNTER — Encounter: Payer: Self-pay | Admitting: *Deleted

## 2012-08-11 ENCOUNTER — Other Ambulatory Visit: Payer: Self-pay | Admitting: *Deleted

## 2012-08-11 ENCOUNTER — Telehealth: Payer: Self-pay | Admitting: *Deleted

## 2012-08-11 DIAGNOSIS — R109 Unspecified abdominal pain: Secondary | ICD-10-CM

## 2012-08-11 LAB — ANTI-NUCLEAR AB-TITER (ANA TITER): ANA Titer 1: 1:80 {titer} — ABNORMAL HIGH

## 2012-08-11 LAB — ANTI-SMOOTH MUSCLE ANTIBODY, IGG: Smooth Muscle Ab: 10 U (ref <20)

## 2012-08-11 NOTE — Telephone Encounter (Signed)
Patient states that she has actually already bought the dicyclomine as it was only $21.00. Therefore, we do not need to send a different prescription.

## 2012-08-11 NOTE — Telephone Encounter (Signed)
OK to send Hyocyamine.375, 1 po bid, #60, 1 refill

## 2012-08-11 NOTE — Telephone Encounter (Signed)
Dr Juanda Chance- Patient's insurance has denied Bentyl 20 mg for patient. Do you want to try her on hyoscyamine??? Also, does she have IBS or just abdominal cramping???

## 2012-08-18 ENCOUNTER — Other Ambulatory Visit: Payer: Medicare Other

## 2012-08-18 DIAGNOSIS — R109 Unspecified abdominal pain: Secondary | ICD-10-CM

## 2012-08-19 LAB — IGG: IgG (Immunoglobin G), Serum: 774 mg/dL (ref 690–1700)

## 2012-08-19 LAB — IGM: IgM, Serum: 120 mg/dL (ref 52–322)

## 2012-09-04 ENCOUNTER — Ambulatory Visit
Admission: RE | Admit: 2012-09-04 | Discharge: 2012-09-04 | Disposition: A | Discharge status: Home / Self Care | Payer: Medicare Other | Source: Ambulatory Visit | Attending: Obstetrics and Gynecology | Admitting: Obstetrics and Gynecology

## 2012-09-04 DIAGNOSIS — Z1231 Encounter for screening mammogram for malignant neoplasm of breast: Secondary | ICD-10-CM

## 2012-10-03 ENCOUNTER — Ambulatory Visit (INDEPENDENT_AMBULATORY_CARE_PROVIDER_SITE_OTHER): Payer: Medicare Other | Admitting: Internal Medicine

## 2012-10-03 VITALS — BP 153/84 | HR 87 | Temp 98.3°F | Resp 18 | Ht 62.0 in | Wt 178.8 lb

## 2012-10-03 DIAGNOSIS — H6590 Unspecified nonsuppurative otitis media, unspecified ear: Secondary | ICD-10-CM

## 2012-10-03 DIAGNOSIS — R05 Cough: Secondary | ICD-10-CM

## 2012-10-03 DIAGNOSIS — H6980 Other specified disorders of Eustachian tube, unspecified ear: Secondary | ICD-10-CM

## 2012-10-03 DIAGNOSIS — J40 Bronchitis, not specified as acute or chronic: Secondary | ICD-10-CM

## 2012-10-03 MED ORDER — AZITHROMYCIN 250 MG PO TABS: ORAL_TABLET | ORAL | Status: DC

## 2012-10-03 MED ORDER — HYDROCODONE-ACETAMINOPHEN 7.5-325 MG/15ML PO SOLN: 5.0000 mL | Freq: Four times a day (QID) | ORAL | Status: DC | PRN

## 2012-10-03 NOTE — Patient Instructions (Addendum)

## 2012-10-03 NOTE — Progress Notes (Signed)
  Subjective:    Patient ID: [name] [name], female    DOB: 1935-09-17, 78 y.o.   MRN: [id number]  HPI Cough and congestion 2 weeks. Discharge is clear, no fever. Cough keeping her awake.   Review of Systems     Objective:   Physical Exam  Vitals reviewed. Constitutional: She is oriented to person, place, and time. She appears well-nourished. No distress.  HENT:  Right Ear: External ear normal.  Left Ear: External ear normal.  Nose: Mucosal edema, rhinorrhea and sinus tenderness present.  Mouth/Throat: Oropharynx is clear and moist.  Neck: Normal range of motion.  Cardiovascular: Normal rate and normal heart sounds.   Pulmonary/Chest: Effort normal and breath sounds normal. She has no wheezes. She has no rales.  Lymphadenopathy:    She has no cervical adenopathy.  Neurological: She is alert and oriented to person, place, and time.  Skin: Skin is warm and dry.          Assessment & Plan:  Bronchitis/ETD/Post viral cough

## 2012-11-07 ENCOUNTER — Ambulatory Visit (INDEPENDENT_AMBULATORY_CARE_PROVIDER_SITE_OTHER): Payer: Medicare Other | Admitting: Family Medicine

## 2012-11-07 VITALS — BP 168/72 | HR 86 | Temp 98.4°F | Resp 18 | Ht 62.5 in | Wt 183.0 lb

## 2012-11-07 DIAGNOSIS — R05 Cough: Secondary | ICD-10-CM

## 2012-11-07 DIAGNOSIS — K140 Glossitis: Secondary | ICD-10-CM

## 2012-11-07 DIAGNOSIS — R631 Polydipsia: Secondary | ICD-10-CM

## 2012-11-07 DIAGNOSIS — R059 Cough, unspecified: Secondary | ICD-10-CM

## 2012-11-07 LAB — POCT CBC
HCT, POC: 38.5 % (ref 37.7–47.9)
Hemoglobin: 12.5 g/dL (ref 12.2–16.2)
Lymph, poc: 1.3 (ref 0.6–3.4)
MCH, POC: 28.2 pg (ref 27–31.2)
MCHC: 32.5 g/dL (ref 31.8–35.4)
POC LYMPH PERCENT: 22 %L (ref 10–50)
POC MID %: 5.3 %M (ref 0–12)
RDW, POC: 14 %
WBC: 6 10*3/uL (ref 4.6–10.2)

## 2012-11-07 MED ORDER — TRIAMCINOLONE ACETONIDE 0.1 % MT PSTE: PASTE | Freq: Two times a day (BID) | OROMUCOSAL | Status: DC

## 2012-11-07 MED ORDER — BENZONATATE 100 MG PO CAPS: ORAL_CAPSULE | ORAL | Status: DC

## 2012-11-07 MED ORDER — CEFDINIR 300 MG PO CAPS: 300.0000 mg | ORAL_CAPSULE | Freq: Two times a day (BID) | ORAL | Status: DC

## 2012-11-07 NOTE — Progress Notes (Addendum)
Subjective:  Patient's cough calmed down, then recurred. It is continued to persist. Occasional small amounts of phlegm. She's not very ill with it, just doesn't feel well he's developed a soreness in the right side of her mouth on the buccal mucosa in the last couple of days. She is thirsty a lot and feels like she is a dry mouth. Her blood sugars have been running good at home, about 110  She has a history of hyponatremia, and says she's been doing some atrocious specks of been very low next time she goes in.  Objective: Pleasant alert elderly lady in no major distress. She got just a rib irritation and looks like a scrapes from her teeth on the mucosa on the right side of her mouth. Her throat is clear. She said her uvula was edematous, but it appears normal to me. Neck supple without significant nodes. Chest is clear. Heart regular with and without murmurs. No ankle edema.  Results for orders placed in visit on 11/07/12  POCT CBC      Result Value Range   WBC 6.0  4.6 - 10.2 K/uL   Lymph, poc 1.3  0.6 - 3.4   POC LYMPH PERCENT 22.0  10 - 50 %L   MID (cbc) 0.3  0 - 0.9   POC MID % 5.3  0 - 12 %M   POC Granulocyte 4.4  2 - 6.9   Granulocyte percent 72.7  37 - 80 %G   RBC 4.43  4.04 - 5.48 M/uL   Hemoglobin 12.5  12.2 - 16.2 g/dL   HCT, POC 16.1  09.6 - 47.9 %   MCV 86.8  80 - 97 fL   MCH, POC 28.2  27 - 31.2 pg   MCHC 32.5  31.8 - 35.4 g/dL   RDW, POC 04.5     Platelet Count, POC 220  142 - 424 K/uL   MPV 8.2  0 - 99.8 fL   Assessment: Cough Nonspecific glossitis History of hyponatremia  Plan: Check a BMET. CBC was normal.  Decide to go ahead and give her around of antibiotics cough continue staying on. Use of Kenalog in oral base of the area of glossitis. Return if worse.

## 2012-11-07 NOTE — Patient Instructions (Signed)
Drink plenty of fluids  Get sufficient rest  Take medications as directed  Return if worse  I will let you know the results of your labs

## 2012-11-08 LAB — BASIC METABOLIC PANEL
CO2: 28 mEq/L (ref 19–32)
Calcium: 9.6 mg/dL (ref 8.4–10.5)
Creat: 0.78 mg/dL (ref 0.50–1.10)
Glucose, Bld: 252 mg/dL — ABNORMAL HIGH (ref 70–99)

## 2013-01-05 ENCOUNTER — Ambulatory Visit
Admission: RE | Admit: 2013-01-05 | Discharge: 2013-01-05 | Disposition: A | Discharge status: Home / Self Care | Payer: Medicare Other | Source: Ambulatory Visit | Attending: Family Medicine | Admitting: Family Medicine

## 2013-01-05 ENCOUNTER — Other Ambulatory Visit: Payer: Self-pay | Admitting: Family Medicine

## 2013-01-05 DIAGNOSIS — M549 Dorsalgia, unspecified: Secondary | ICD-10-CM

## 2013-05-01 ENCOUNTER — Encounter: Payer: Self-pay | Admitting: Gynecology

## 2013-05-01 ENCOUNTER — Ambulatory Visit (INDEPENDENT_AMBULATORY_CARE_PROVIDER_SITE_OTHER): Payer: Medicare Other | Admitting: Gynecology

## 2013-05-01 DIAGNOSIS — N907 Vulvar cyst: Secondary | ICD-10-CM

## 2013-05-01 DIAGNOSIS — N9489 Other specified conditions associated with female genital organs and menstrual cycle: Secondary | ICD-10-CM

## 2013-05-01 MED ORDER — TERCONAZOLE 0.4 % VA CREA: 1.0000 | TOPICAL_CREAM | Freq: Every day | VAGINAL | Status: DC

## 2013-05-01 NOTE — Progress Notes (Signed)
78 year old patient presented to the office today complaining of a labia lesion that had been draining. She states that she has had this for many years and all placenta began to come to a head and to drain. She also has some vulvar irritation.  Exam: Slightly erythematous external genitalia. At the inferior portion of the labia majora there appears to be a sebaceous cyst started to drain. The area was cleansed with Betadine solution and the cyst was decompressed and Neosporin applied.  Assessment/plan: Small noninfected left inferior labia majora sebaceous cyst. Patient will use antibacterial soap at home and apply Neosporin 2-3 times a day. Patient returned back next month for her annual exam. For external vulvitis I've asked her to apply Monistat before bedtime for one week.

## 2013-05-01 NOTE — Patient Instructions (Addendum)

## 2013-05-05 ENCOUNTER — Encounter: Payer: Self-pay | Admitting: Obstetrics and Gynecology

## 2013-05-11 ENCOUNTER — Encounter: Payer: Self-pay | Admitting: Obstetrics and Gynecology

## 2013-05-26 ENCOUNTER — Other Ambulatory Visit: Payer: Self-pay | Admitting: Gynecology

## 2013-06-02 ENCOUNTER — Ambulatory Visit (INDEPENDENT_AMBULATORY_CARE_PROVIDER_SITE_OTHER): Payer: Medicare Other | Admitting: Gynecology

## 2013-06-02 ENCOUNTER — Encounter: Payer: Self-pay | Admitting: Gynecology

## 2013-06-02 VITALS — BP 134/90 | Ht 61.75 in | Wt 179.4 lb

## 2013-06-02 DIAGNOSIS — R32 Unspecified urinary incontinence: Secondary | ICD-10-CM

## 2013-06-02 DIAGNOSIS — N644 Mastodynia: Secondary | ICD-10-CM

## 2013-06-02 DIAGNOSIS — N816 Rectocele: Secondary | ICD-10-CM

## 2013-06-02 DIAGNOSIS — N952 Postmenopausal atrophic vaginitis: Secondary | ICD-10-CM

## 2013-06-02 DIAGNOSIS — R351 Nocturia: Secondary | ICD-10-CM

## 2013-06-02 NOTE — Patient Instructions (Addendum)
Visene for dry eyes over the counter

## 2013-06-02 NOTE — Progress Notes (Signed)
[name] [name] April 06, 1935 [id number]   History:    78 y.o. for GYN followup. The patient stated that on and off she experiences some right breast tenderness especially when she is lying on her right side. She denies palpating any masses or nipple discharge. Her last mammogram was in December 2013 which was normal. She had a normal bone density study in 2009. She is on no hormone replacement therapy. Patient has past history of colon polyps but had a normal colonoscopy in 2011. Her last Pap smear was normal in 2013 and she has always had normal Pap smears. Patient that time experienced some vulvar pruritus but uses over-the-counter cream with complete resolution. She also states that time she is stress incontinence and nocturia only if she drinks lots of fluids.   Past medical history,surgical history, family history and social history were all reviewed and documented in the EPIC chart.  Gynecologic History No LMP recorded. Patient is postmenopausal. Contraception: post menopausal status Last Pap: 2013. Results were: normal Last mammogram: 2013. Results were: normal  Obstetric History OB History  Gravida Para Term Preterm AB SAB TAB Ectopic Multiple Living  1 1 1       1     # Outcome Date GA Lbr Len/2nd Weight Sex Delivery Anes PTL Lv  1 TRM                ROS: A ROS was performed and pertinent positives and negatives are included in the history.  GENERAL: No fevers or chills. HEENT: No change in vision, no earache, sore throat or sinus congestion. NECK: No pain or stiffness. CARDIOVASCULAR: No chest pain or pressure. No palpitations. PULMONARY: No shortness of breath, cough or wheeze. GASTROINTESTINAL: No abdominal pain, nausea, vomiting or diarrhea, melena or bright red blood per rectum. GENITOURINARY: No urinary frequency, urgency, hesitancy or dysuria. MUSCULOSKELETAL: No joint or muscle pain, no back pain, no recent trauma. DERMATOLOGIC: No rash, no itching, no lesions. ENDOCRINE: No  polyuria, polydipsia, no heat or cold intolerance. No recent change in weight. HEMATOLOGICAL: No anemia or easy bruising or bleeding. NEUROLOGIC: No headache, seizures, numbness, tingling or weakness. PSYCHIATRIC: No depression, no loss of interest in normal activity or change in sleep pattern.     Exam: chaperone present  BP 134/90  Ht 5' 1.75" (1.568 m)  Wt 179 lb 6.4 oz (81.375 kg)  BMI 33.1 kg/m2  Body mass index is 33.1 kg/(m^2).  General appearance : Well developed well nourished female. No acute distress HEENT: Neck supple, trachea midline, no carotid bruits, no thyroidmegaly Lungs: Clear to auscultation, no rhonchi or wheezes, or rib retractions  Heart: Regular rate and rhythm, no murmurs or gallops Breast:Examined in sitting and supine position were symmetrical in appearance, no palpable masses or tenderness,  no skin retraction, no nipple inversion, no nipple discharge, no skin discoloration, no axillary or supraclavicular lymphadenopathy Abdomen: no palpable masses or tenderness, no rebound or guarding Extremities: no edema or skin discoloration or tenderness Skin:multiple skin moles ovarian sizes and shapes and colors Pelvic:  Bartholin, Urethra, Skene Glands: Within normal limits             Vagina: No gross lesions or discharge, atrophic changes,first degree rectocele  Cervix: No gross lesions or discharge  Uterus  axial, normal size, shape and consistency, non-tender and mobile  Adnexa  Without masses or tenderness  Anus and perineum  normal   Rectovaginal  normal sphincter tone without palpated masses or tenderness  Hemoccult PCP provides     Assessment/Plan:  78 y.o. with multiple skin moles will be referred to dermatologist for further assessment. Dr. Tiburcio Pea patient's PCP will be doing her lab work. She will need a bone density study and mammogram at the end of this year. Pap smear not indicated based on her age and new guidelines. Hemoccult card provided  by her PCP. We discussed importance of calcium and vitamin D for osteoporosis prevention.    Ok Edwards MD, 2:27 PM 06/02/2013

## 2013-06-08 ENCOUNTER — Telehealth: Payer: Self-pay | Admitting: *Deleted

## 2013-06-08 ENCOUNTER — Other Ambulatory Visit: Payer: Self-pay

## 2013-06-08 DIAGNOSIS — N644 Mastodynia: Secondary | ICD-10-CM

## 2013-06-08 DIAGNOSIS — Z1231 Encounter for screening mammogram for malignant neoplasm of breast: Secondary | ICD-10-CM

## 2013-06-08 NOTE — Telephone Encounter (Signed)
Please  schedule diagnostic mammogram of the right breast as a result of patient's persistent right breast tenderness. Routine mammogram contra lateral breasts. She was scheduled originally for screening mammogram in December.

## 2013-06-08 NOTE — Telephone Encounter (Signed)
Pt was seen on 06/02/13 for annual, spoke with you about some breast tenderness. Pt is still having this and would like to have diag. Mammogram, her screening is scheduled in dec. Please advise

## 2013-06-08 NOTE — Telephone Encounter (Signed)
The patient saw Dr. Lily Peer and this needs to be routed to him

## 2013-06-08 NOTE — Telephone Encounter (Signed)
Order placed, pt informed to call.

## 2013-06-19 ENCOUNTER — Other Ambulatory Visit: Payer: Self-pay | Admitting: Gynecology

## 2013-06-19 ENCOUNTER — Ambulatory Visit
Admission: RE | Admit: 2013-06-19 | Discharge: 2013-06-19 | Disposition: A | Discharge status: Home / Self Care | Payer: Medicare Other | Source: Ambulatory Visit | Attending: Gynecology | Admitting: Gynecology

## 2013-06-19 DIAGNOSIS — N644 Mastodynia: Secondary | ICD-10-CM

## 2013-07-14 ENCOUNTER — Ambulatory Visit (INDEPENDENT_AMBULATORY_CARE_PROVIDER_SITE_OTHER): Payer: Medicare Other | Admitting: Emergency Medicine

## 2013-07-14 VITALS — BP 126/80 | HR 90 | Temp 99.3°F | Resp 18 | Ht 62.0 in | Wt 179.4 lb

## 2013-07-14 DIAGNOSIS — J029 Acute pharyngitis, unspecified: Secondary | ICD-10-CM

## 2013-07-14 MED ORDER — AMOXICILLIN 500 MG PO CAPS: 500.0000 mg | ORAL_CAPSULE | Freq: Three times a day (TID) | ORAL | Status: DC

## 2013-07-14 NOTE — Progress Notes (Signed)
[facility] [address], [place] [address] [phone]  Date:  07/14/2013   Name:  [name] [name]   DOB:  10-25-1934   MRN:  [id number]  PCP:  Johny Blamer, MD    Chief Complaint: Cough, Sore Throat and Otalgia   History of Present Illness:  [name] [name] is a 78 y.o. very pleasant female patient who presents with the following:  Ill with sore throat, hoarseness and pressure in her ears two days ago.  No fever or chills, cough or coryza.  No nausea or vomiting.  No wheezing or shortness of breath.  Rash.  No improvement with over the counter medications or other home remedies. Denies other complaint or health concern today.   Patient Active Problem List   Diagnosis Date Noted  . Urinary incontinence 06/02/2013  . Nocturia 06/02/2013  . Rectocele 06/02/2013  . Vaginal atrophy 06/02/2013  . Sebaceous cyst of labia 05/01/2013  . Diabetes mellitus   . Reflux   . Sleep apnea     Past Medical History  Diagnosis Date  . Endometrial polyp   . Endocervical polyp   . Diabetes mellitus   . Diverticulosis   . Hyponatremia   . Hypertension   . Hyperlipidemia   . GERD (gastroesophageal reflux disease)   . DDD (degenerative disc disease), lumbar   . Hiatal hernia   . Hx of adenomatous colonic polyps     Past Surgical History  Procedure Laterality Date  . Cholecystectomy    . Dilation and curettage of uterus    . Hysteroscopy    . Tonsillectomy      History  Substance Use Topics  . Smoking status: Never Smoker   . Smokeless tobacco: Never Used  . Alcohol Use: No    Family History  Problem Relation Age of Onset  . Hypertension Mother   . Stroke Mother   . Diabetes Sister     x 2  . Breast cancer Cousin     Mat. 1st cousin-Age 67  . Lung cancer Brother     mets  . Alcohol abuse Father     Allergies  Allergen Reactions  . Prednisone     REACTION: lethargic    Medication list has been reviewed and updated.  Current  Outpatient Prescriptions on File Prior to Visit  Medication Sig Dispense Refill  . acetaminophen (TYLENOL) 325 MG tablet Take 650 mg by mouth every 6 (six) hours as needed for pain.      [name] aspirin 81 MG tablet Take 81 mg by mouth 2 (two) times daily.       [name] glipiZIDE (GLUCOTROL) 10 MG tablet Take 10 mg by mouth 2 (two) times daily before a meal.      . Lansoprazole (PREVACID PO) Take 1 tablet by mouth daily before supper.       . losartan (COZAAR) 50 MG tablet Take 50 mg by mouth daily.      . metFORMIN (GLUCOPHAGE) 1000 MG tablet Take 1,000 mg by mouth daily.      . Red Yeast Rice Extract 600 MG CAPS Take by mouth.      . terconazole (TERAZOL 7) 0.4 % vaginal cream PLACE 1 APPLICATOR VAGINALLY AT BEDTIME.  45 g  0   No current facility-administered medications on file prior to visit.    Review of Systems:  As per HPI, otherwise negative.    Physical Examination: Filed Vitals:   07/14/13 1335  BP: 126/80  Pulse: 90  Temp: 99.3 F (37.4 C)  Resp: 18   Filed Vitals:   07/14/13 1335  Height: 5\' 2"  (1.575 m)  Weight: 179 lb 6.4 oz (81.375 kg)   Body mass index is 32.8 kg/(m^2). Ideal Body Weight: Weight in (lb) to have BMI = 25: 136.4  GEN: WDWN, NAD, Non-toxic, A & O x 3 HEENT: Atraumatic, Normocephalic. Neck supple. No masses, No LAD.  pharyngitis Ears and Nose: No external deformity. CV: RRR, No M/G/R. No JVD. No thrill. No extra heart sounds. PULM: CTA B, no wheezes, crackles, rhonchi. No retractions. No resp. distress. No accessory muscle use. ABD: S, NT, ND, +BS. No rebound. No HSM. EXTR: No c/c/e NEURO Normal gait.  PSYCH: Normally interactive. Conversant. Not depressed or anxious appearing.  Calm demeanor.    Assessment and Plan: Pharyngitis Amoxicillin   Signed,  Phillips Odor, MD

## 2013-07-14 NOTE — Patient Instructions (Signed)
Strep Throat  Strep throat is an infection of the throat caused by a bacteria named Streptococcus pyogenes. Your caregiver may call the infection streptococcal "tonsillitis" or "pharyngitis" depending on whether there are signs of inflammation in the tonsils or back of the throat. Strep throat is most common in children aged 76 15 years during the cold months of the year, but it can occur in people of any age during any season. This infection is spread from person to person (contagious) through coughing, sneezing, or other close contact.  SYMPTOMS   · Fever or chills.  · Painful, swollen, red tonsils or throat.  · Pain or difficulty when swallowing.  · White or yellow spots on the tonsils or throat.  · Swollen, tender lymph nodes or "glands" of the neck or under the jaw.  · Red rash all over the body (rare).  DIAGNOSIS   Many different infections can cause the same symptoms. A test must be done to confirm the diagnosis so the right treatment can be given. A "rapid strep test" can help your caregiver make the diagnosis in a few minutes. If this test is not available, a light swab of the infected area can be used for a throat culture test. If a throat culture test is done, results are usually available in a day or two.  TREATMENT   Strep throat is treated with antibiotic medicine.  HOME CARE INSTRUCTIONS   · Gargle with 1 tsp of salt in 1 cup of warm water, 3 4 times per day or as needed for comfort.  · Family members who also have a sore throat or fever should be tested for strep throat and treated with antibiotics if they have the strep infection.  · Make sure everyone in your household washes their hands well.  · Do not share food, drinking cups, or personal items that could cause the infection to spread to others.  · You may need to eat a soft food diet until your sore throat gets better.  · Drink enough water and fluids to keep your urine clear or pale yellow. This will help prevent dehydration.  · Get plenty of  rest.  · Stay home from school, daycare, or work until you have been on antibiotics for 24 hours.  · Only take over-the-counter or prescription medicines for pain, discomfort, or fever as directed by your caregiver.  · If antibiotics are prescribed, take them as directed. Finish them even if you start to feel better.  SEEK MEDICAL CARE IF:   · The glands in your neck continue to enlarge.  · You develop a rash, cough, or earache.  · You cough up green, yellow-brown, or bloody sputum.  · You have pain or discomfort not controlled by medicines.  · Your problems seem to be getting worse rather than better.  SEEK IMMEDIATE MEDICAL CARE IF:   · You develop any new symptoms such as vomiting, severe headache, stiff or painful neck, chest pain, shortness of breath, or trouble swallowing.  · You develop severe throat pain, drooling, or changes in your voice.  · You develop swelling of the neck, or the skin on the neck becomes red and tender.  · You have a fever.  · You develop signs of dehydration, such as fatigue, dry mouth, and decreased urination.  · You become increasingly sleepy, or you cannot wake up completely.  Document Released: 08/31/2000 Document Revised: 08/20/2012 Document Reviewed: 11/02/2010  Exit[employer]® Patient Information ©2014 [employer].

## 2013-09-08 ENCOUNTER — Ambulatory Visit
Admission: RE | Admit: 2013-09-08 | Discharge: 2013-09-08 | Disposition: A | Discharge status: Home / Self Care | Payer: Medicare Other | Source: Ambulatory Visit

## 2013-09-08 DIAGNOSIS — Z1231 Encounter for screening mammogram for malignant neoplasm of breast: Secondary | ICD-10-CM

## 2014-05-03 ENCOUNTER — Ambulatory Visit (INDEPENDENT_AMBULATORY_CARE_PROVIDER_SITE_OTHER): Payer: Medicare Other | Admitting: Family Medicine

## 2014-05-03 VITALS — BP 130/82 | HR 85 | Temp 98.0°F | Resp 16 | Ht 62.0 in | Wt 184.0 lb

## 2014-05-03 DIAGNOSIS — R21 Rash and other nonspecific skin eruption: Secondary | ICD-10-CM

## 2014-05-03 MED ORDER — CLOTRIMAZOLE-BETAMETHASONE 1-0.05 % EX CREA: 1.0000 "application " | TOPICAL_CREAM | Freq: Two times a day (BID) | CUTANEOUS | Status: DC

## 2014-05-03 NOTE — Patient Instructions (Signed)
Call if itching is not controlled in the next 24 hours.

## 2014-05-03 NOTE — Progress Notes (Signed)
79 year old woman who developed a rash 48 hours prior to this visit. The rash and itchiness began along her cholecystectomy scar (42 years ago) and involves the skin immediately medial but not beyond the midline of her abdomen.  Patient denies any new jewelry or clothing, new detergent, involvement of the back..  Patient has had shingles in the past.  Objective: No acute distress Patient has excoriated red right cholecystectomy scar with medial and inferior erythema without papular or vesicular changes.  Assessment: This is most typical of an allergic type contact dermatitis. I see no evidence for insect borne or poison ivy type rash. Is also no area on her back suggestive of shingles.  Rash and nonspecific skin eruption - Plan: clotrimazole-betamethasone (LOTRISONE) cream  Signed, Robyn Haber, MD

## 2014-06-08 ENCOUNTER — Encounter: Payer: Self-pay | Admitting: Gynecology

## 2014-06-08 ENCOUNTER — Ambulatory Visit (INDEPENDENT_AMBULATORY_CARE_PROVIDER_SITE_OTHER): Payer: Medicare Other | Admitting: Gynecology

## 2014-06-08 VITALS — BP 136/80 | Ht 62.0 in | Wt 183.0 lb

## 2014-06-08 DIAGNOSIS — N952 Postmenopausal atrophic vaginitis: Secondary | ICD-10-CM

## 2014-06-08 DIAGNOSIS — N816 Rectocele: Secondary | ICD-10-CM

## 2014-06-08 DIAGNOSIS — N942 Vaginismus: Secondary | ICD-10-CM

## 2014-06-08 DIAGNOSIS — N393 Stress incontinence (female) (male): Secondary | ICD-10-CM

## 2014-06-08 DIAGNOSIS — R351 Nocturia: Secondary | ICD-10-CM

## 2014-06-08 NOTE — Progress Notes (Signed)
[name] [name] 1935-07-21 [id number]   History:    79 y.o.  for GYN followup. Patient has complained of vaginal dryness and irritation at times. She does have a rectocele. She also suffers from nocturia and occasional stress urinary incontinence. Patient had a normal bone density study in 2009. Patient with no past history of abnormal Pap smears.She is on no hormone replacement therapy. Patient has past history of colon polyps but had a normal colonoscopy in 2011. (She does have past history of colon polyps and she is on a five-year recall).   Past medical history,surgical history, family history and social history were all reviewed and documented in the EPIC chart.  Gynecologic History No LMP recorded. Patient is postmenopausal. Contraception: post menopausal status Last Pap: 2013. Results were: normal Last mammogram: 2013. Results were: normal  Obstetric History OB History  Gravida Para Term Preterm AB SAB TAB Ectopic Multiple Living  1 1 1       1     # Outcome Date GA Lbr Len/2nd Weight Sex Delivery Anes PTL Lv  1 TRM                ROS: A ROS was performed and pertinent positives and negatives are included in the history.  GENERAL: No fevers or chills. HEENT: No change in vision, no earache, sore throat or sinus congestion. NECK: No pain or stiffness. CARDIOVASCULAR: No chest pain or pressure. No palpitations. PULMONARY: No shortness of breath, cough or wheeze. GASTROINTESTINAL: No abdominal pain, nausea, vomiting or diarrhea, melena or bright red blood per rectum. GENITOURINARY: No urinary frequency, urgency, hesitancy or dysuria. MUSCULOSKELETAL: No joint or muscle pain, no back pain, no recent trauma. DERMATOLOGIC: No rash, no itching, no lesions. ENDOCRINE: No polyuria, polydipsia, no heat or cold intolerance. No recent change in weight. HEMATOLOGICAL: No anemia or easy bruising or bleeding. NEUROLOGIC: No headache, seizures, numbness, tingling or weakness. PSYCHIATRIC: No  depression, no loss of interest in normal activity or change in sleep pattern.     Exam: chaperone present  BP 136/80  Ht 5\' 2"  (1.575 m)  Wt 183 lb (83.008 kg)  BMI 33.46 kg/m2  Body mass index is 33.46 kg/(m^2).  General appearance : Well developed well nourished female. No acute distress HEENT: Neck supple, trachea midline, no carotid bruits, no thyroidmegaly Lungs: Clear to auscultation, no rhonchi or wheezes, or rib retractions  Heart: Regular rate and rhythm, no murmurs or gallops Breast:Examined in sitting and supine position were symmetrical in appearance, no palpable masses or tenderness,  no skin retraction, no nipple inversion, no nipple discharge, no skin discoloration, no axillary or supraclavicular lymphadenopathy Abdomen: no palpable masses or tenderness, no rebound or guarding Extremities: no edema or skin discoloration or tenderness  Pelvic:  Bartholin, Urethra, Skene Glands: Within normal limits             Vagina: No gross lesions or discharge  Cervix: No gross lesions or discharge  Uterus difficult to examine due to vaginismus and tenderness  Adnexa  same as above Anus and perineum  normal   Rectovaginal  normal sphincter tone without palpated masses or tenderness             Hemoccult PCP provides     Assessment/Plan:  79 y.o. female who returned back to the office in 1-2 weeks for better assessment of her female reproductive organs due to the fact that she had vaginismus during the exam and was slightly tender in her abdomen. I'm going  to provider also with literature information on Kegel exercises. Will discuss further when she returns about possibly put her on some form of anticholinergics for her nocturia and urgency incontinence. Her PCP is Dr. [name] will be doing her blood work. Her vaccinations are up-to-date. She will be referred to [facility] dermatologist for further evaluation of multiple moles. She was reminded last year but to not follow through. She  will schedule her bone density study and mammogram which are both due.    Note: This dictation was prepared with  Dragon/digital dictation along withSmart phrase technology. Any transcriptional errors that result from this process are unintentional.   Terrance Mass MD, 2:34 PM 06/08/2014

## 2014-06-08 NOTE — Patient Instructions (Addendum)
Tdap Vaccine (Tetanus, Diphtheria, Pertussis): What You Need to Know 1. Why get vaccinated? Tetanus, diphtheria and pertussis can be very serious diseases, even for adolescents and adults. Tdap vaccine can protect us from these diseases. TETANUS (Lockjaw) causes painful muscle tightening and stiffness, usually all over the body.  It can lead to tightening of muscles in the head and neck so you can't open your mouth, swallow, or sometimes even breathe. Tetanus kills about 1 out of 5 people who are infected. DIPHTHERIA can cause a thick coating to form in the back of the throat.  It can lead to breathing problems, paralysis, heart failure, and death. PERTUSSIS (Whooping Cough) causes severe coughing spells, which can cause difficulty breathing, vomiting and disturbed sleep.  It can also lead to weight loss, incontinence, and rib fractures. Up to 2 in 100 adolescents and 5 in 100 adults with pertussis are hospitalized or have complications, which could include pneumonia or death. These diseases are caused by bacteria. Diphtheria and pertussis are spread from person to person through coughing or sneezing. Tetanus enters the body through cuts, scratches, or wounds. Before vaccines, the [place] saw as many as 200,000 cases a year of diphtheria and pertussis, and hundreds of cases of tetanus. Since vaccination began, tetanus and diphtheria have dropped by about 99% and pertussis by about 80%. 2. Tdap vaccine Tdap vaccine can protect adolescents and adults from tetanus, diphtheria, and pertussis. One dose of Tdap is routinely given at age 11 or 12. People who did not get Tdap at that age should get it as soon as possible. Tdap is especially important for health care professionals and anyone having close contact with a baby younger than 12 months. Pregnant women should get a dose of Tdap during every pregnancy, to protect the newborn from pertussis. Infants are most at risk for severe, life-threatening  complications from pertussis. A similar vaccine, called Td, protects from tetanus and diphtheria, but not pertussis. A Td booster should be given every 10 years. Tdap may be given as one of these boosters if you have not already gotten a dose. Tdap may also be given after a severe cut or burn to prevent tetanus infection. Your doctor can give you more information. Tdap may safely be given at the same time as other vaccines. 3. Some people should not get this vaccine  If you ever had a life-threatening allergic reaction after a dose of any tetanus, diphtheria, or pertussis containing vaccine, OR if you have a severe allergy to any part of this vaccine, you should not get Tdap. Tell your doctor if you have any severe allergies.  If you had a coma, or long or multiple seizures within 7 days after a childhood dose of DTP or DTaP, you should not get Tdap, unless a cause other than the vaccine was found. You can still get Td.  Talk to your doctor if you:  have epilepsy or another nervous system problem,  had severe pain or swelling after any vaccine containing diphtheria, tetanus or pertussis,  ever had Guillain-Barr Syndrome (GBS),  aren't feeling well on the day the shot is scheduled. 4. Risks of a vaccine reaction With any medicine, including vaccines, there is a chance of side effects. These are usually mild and go away on their own, but serious reactions are also possible. Brief fainting spells can follow a vaccination, leading to injuries from falling. Sitting or lying down for about 15 minutes can help prevent these. Tell your doctor if you feel   dizzy or light-headed, or have vision changes or ringing in the ears. Mild problems following Tdap (Did not interfere with activities)  Pain where the shot was given (about 3 in 4 adolescents or 2 in 3 adults)  Redness or swelling where the shot was given (about 1 person in 5)  Mild fever of at least 100.46F (up to about 1 in 25 adolescents or  1 in 100 adults)  Headache (about 3 or 4 people in 10)  Tiredness (about 1 person in 3 or 4)  Nausea, vomiting, diarrhea, stomach ache (up to 1 in 4 adolescents or 1 in 10 adults)  Chills, body aches, sore joints, rash, swollen glands (uncommon) Moderate problems following Tdap (Interfered with activities, but did not require medical attention)  Pain where the shot was given (about 1 in 5 adolescents or 1 in 100 adults)  Redness or swelling where the shot was given (up to about 1 in 16 adolescents or 1 in 25 adults)  Fever over 102F (about 1 in 100 adolescents or 1 in 250 adults)  Headache (about 3 in 20 adolescents or 1 in 10 adults)  Nausea, vomiting, diarrhea, stomach ache (up to 1 or 3 people in 100)  Swelling of the entire arm where the shot was given (up to about 3 in 100). Severe problems following Tdap (Unable to perform usual activities; required medical attention)  Swelling, severe pain, bleeding and redness in the arm where the shot was given (rare). A severe allergic reaction could occur after any vaccine (estimated less than 1 in a million doses). 5. What if there is a serious reaction? What should I look for?  Look for anything that concerns you, such as signs of a severe allergic reaction, very high fever, or behavior changes. Signs of a severe allergic reaction can include hives, swelling of the face and throat, difficulty breathing, a fast heartbeat, dizziness, and weakness. These would start a few minutes to a few hours after the vaccination. What should I do?  If you think it is a severe allergic reaction or other emergency that can't wait, call 9-1-1 or get the person to the nearest hospital. Otherwise, call your doctor.  Afterward, the reaction should be reported to the "Vaccine Adverse Event Reporting System" (VAERS). Your doctor might file this report, or you can do it yourself through the VAERS web site at www.vaers.SamedayNews.es, or by calling  986-328-4259. VAERS is only for reporting reactions. They do not give medical advice.  6. The National Vaccine Injury Compensation Program The [employer] Vaccine Injury Compensation Program (VICP) is a federal program that was created to compensate people who may have been injured by certain vaccines. Persons who believe they may have been injured by a vaccine can learn about the program and about filing a claim by calling 705 188 4019 or visiting the [place] website at GoldCloset.com.ee. 7. How can I learn more?  Ask your doctor.  Call your local or state health department.  Contact the Centers for Disease Control and Prevention (CDC):  Call 571-154-7621 or visit CDC's website at http://hunter.com/. CDC Tdap Vaccine VIS (01/24/12) Document Released: 03/04/2012 Document Revised: 01/18/2014 Document Reviewed: 12/16/2013 ExitCare Patient Information 2015 [place], [place]. This information is not intended to replace advice given to you by your health care provider. Make sure you discuss any questions you have with your health care provider. Kegel Exercises The goal of Kegel exercises is to isolate and exercise your pelvic floor muscles. These muscles act as a hammock that supports the  rectum, vagina, small intestine, and uterus. As the muscles weaken, the hammock sags and these organs are displaced from their normal positions. Kegel exercises can strengthen your pelvic floor muscles and help you to improve bladder and bowel control, improve sexual response, and help reduce many problems and some discomfort during pregnancy. Kegel exercises can be done anywhere and at any time. HOW TO PERFORM KEGEL EXERCISES 1. Locate your pelvic floor muscles. To do this, squeeze (contract) the muscles that you use when you try to stop the flow of urine. You will feel a tightness in the vaginal area (women) and a tight lift in the rectal area (men and women). 2. When you begin, contract your pelvic  muscles tight for 2-5 seconds, then relax them for 2-5 seconds. This is one set. Do 4-5 sets with a short pause in between. 3. Contract your pelvic muscles for 8-10 seconds, then relax them for 8-10 seconds. Do 4-5 sets. If you cannot contract your pelvic muscles for 8-10 seconds, try 5-7 seconds and work your way up to 8-10 seconds. Your goal is 4-5 sets of 10 contractions each day. Keep your stomach, buttocks, and legs relaxed during the exercises. Perform sets of both short and long contractions. Vary your positions. Perform these contractions 3-4 times per day. Perform sets while you are:   Lying in bed in the morning.  Standing at lunch.  Sitting in the late afternoon.  Lying in bed at night. You should do 40-50 contractions per day. Do not perform more Kegel exercises per day than recommended. Overexercising can cause muscle fatigue. Continue these exercises for for at least 15-20 weeks or as directed by your caregiver. Document Released: 08/20/2012 Document Reviewed: 08/20/2012 [facility] Patient Information 2015 [place]. This information is not intended to replace advice given to you by your health care provider. Make sure you discuss any questions you have with your health care provider.

## 2014-07-14 ENCOUNTER — Ambulatory Visit (INDEPENDENT_AMBULATORY_CARE_PROVIDER_SITE_OTHER): Payer: Medicare Other

## 2014-07-14 ENCOUNTER — Other Ambulatory Visit: Payer: Self-pay | Admitting: Gynecology

## 2014-07-14 ENCOUNTER — Encounter: Payer: Self-pay | Admitting: Gynecology

## 2014-07-14 ENCOUNTER — Ambulatory Visit (INDEPENDENT_AMBULATORY_CARE_PROVIDER_SITE_OTHER): Payer: Medicare Other | Admitting: Gynecology

## 2014-07-14 VITALS — BP 130/82

## 2014-07-14 DIAGNOSIS — D251 Intramural leiomyoma of uterus: Secondary | ICD-10-CM

## 2014-07-14 DIAGNOSIS — N942 Vaginismus: Secondary | ICD-10-CM

## 2014-07-14 DIAGNOSIS — R10819 Abdominal tenderness, unspecified site: Secondary | ICD-10-CM

## 2014-07-14 DIAGNOSIS — N8331 Acquired atrophy of ovary: Secondary | ICD-10-CM

## 2014-07-14 DIAGNOSIS — N83319 Acquired atrophy of ovary, unspecified side: Secondary | ICD-10-CM

## 2014-07-14 DIAGNOSIS — N3941 Urge incontinence: Secondary | ICD-10-CM

## 2014-07-14 NOTE — Progress Notes (Signed)
   Patient presented to the office today to discuss her ultrasound. Patient was seen the office for annual exam was 06/08/2014 as a result of her vaginismus pelvic examination was not possible. Also patient has a history of a small intramural fibroid. Her last ultrasound had been in 2010. Patient denies any vaginal bleeding. Patient on no hormone replacement therapy. Patient has had history of urgency incontinence and nocturia but appears that her consumption of fluid is very high in the day before bedtime. She had previously been provided with literature information on Keiko exercises.  Ultrasound: Uterus measured 5.5 x 4.7 x 2.6 cm with endometrial stripe at 2.5 mm. Small intrauterine myoma measuring 9 x 8 mm was present. Right ovary was not seen. Left ovary was atrophic. There was a lot of bowel shadowing unable to identify the right ovary. It was no fluid in the cul-de-sac.  Assessment/plan: Postmenopausal patient was stable intramural myoma unchanged from 2010. Atrophic ovaries. Patient to continue to use her Keagle exercises. We talked about fluid modification. If in the next 65 she continues with urge incontinence we have discussed about proceeding with prescribing some form of anti-cholinergic medication. Patient was reminded again that she is overdue for mammogram and bone density study. Patient had a normal colonoscopy 2011. Patient does have history of colon polyp. She will probably need a colonoscopy next year.

## 2014-07-19 ENCOUNTER — Encounter: Payer: Self-pay | Admitting: Gynecology

## 2014-09-12 ENCOUNTER — Ambulatory Visit (INDEPENDENT_AMBULATORY_CARE_PROVIDER_SITE_OTHER): Payer: Medicare Other | Admitting: Family Medicine

## 2014-09-12 VITALS — BP 140/78 | HR 83 | Temp 97.3°F | Resp 18 | Ht 63.0 in | Wt 183.6 lb

## 2014-09-12 DIAGNOSIS — L03011 Cellulitis of right finger: Secondary | ICD-10-CM

## 2014-09-12 MED ORDER — CEPHALEXIN 500 MG PO CAPS: 500.0000 mg | ORAL_CAPSULE | Freq: Three times a day (TID) | ORAL | Status: DC

## 2014-09-12 MED ORDER — MUPIROCIN CALCIUM 2 % EX CREA: 1.0000 "application " | TOPICAL_CREAM | Freq: Three times a day (TID) | CUTANEOUS | Status: DC

## 2014-09-12 NOTE — Progress Notes (Signed)
   Subjective:    Patient ID: [name] [name], female    DOB: 1934-11-30, 80 y.o.   MRN: [id number] This chart was scribed for Robyn Haber, MD by Zola Button, Medical Scribe. This patient was seen in Room 9 and the patient's care was started at 2:22 PM.   HPI HPI Comments: [name] [name] is a 79 y.o. female with a hx of arthritis who presents to the [facility] complaining of right second finger pain that started about 1 month ago. Patient states that she noticed dry, scaly skin to her right second finger one day; the following day, she touched the finger and the skin fell off. She has been using neosporin on her finger. Patient has allergies to prednisone.  Review of Systems  Skin: Positive for wound.       Objective:   Physical Exam CONSTITUTIONAL: Well developed/well nourished HEAD: Normocephalic/atraumatic EYES: EOM/PERRL ENMT: Mucous membranes moist NECK: supple no meningeal signs SPINE: entire spine nontender CV: S1/S2 noted, no murmurs/rubs/gallops noted LUNGS: Lungs are clear to auscultation bilaterally, no apparent distress ABDOMEN: soft, nontender, no rebound or guarding GU: no cva tenderness NEURO: Pt is awake/alert, moves all extremitiesx4 EXTREMITIES: Diffusely red at the cuticle of right second finger with mild tenderness. Normal ROM, although she does have Heberden's nodes on all of her fingers of both hands. SKIN: warm, color normal PSYCH: no abnormalities of mood noted        Assessment & Plan:   This chart was scribed in my presence and reviewed by me personally.    ICD-9-CM ICD-10-CM   1. Cellulitis of finger of right hand 681.00 L03.011 cephALEXin (KEFLEX) 500 MG capsule     mupirocin cream (BACTROBAN) 2 %     Signed, Robyn Haber, MD

## 2014-10-22 ENCOUNTER — Other Ambulatory Visit: Payer: Self-pay

## 2014-10-22 DIAGNOSIS — Z1231 Encounter for screening mammogram for malignant neoplasm of breast: Secondary | ICD-10-CM

## 2014-11-04 ENCOUNTER — Ambulatory Visit
Admission: RE | Admit: 2014-11-04 | Discharge: 2014-11-04 | Disposition: A | Discharge status: Home / Self Care | Payer: Medicare Other | Source: Ambulatory Visit

## 2014-11-04 ENCOUNTER — Encounter (INDEPENDENT_AMBULATORY_CARE_PROVIDER_SITE_OTHER): Payer: Self-pay

## 2014-11-04 DIAGNOSIS — Z1231 Encounter for screening mammogram for malignant neoplasm of breast: Secondary | ICD-10-CM

## 2015-02-07 ENCOUNTER — Encounter: Payer: Self-pay | Admitting: Internal Medicine

## 2015-07-08 ENCOUNTER — Ambulatory Visit (INDEPENDENT_AMBULATORY_CARE_PROVIDER_SITE_OTHER): Payer: Medicare Other | Admitting: Family Medicine

## 2015-07-08 ENCOUNTER — Ambulatory Visit (INDEPENDENT_AMBULATORY_CARE_PROVIDER_SITE_OTHER): Payer: Medicare Other

## 2015-07-08 VITALS — BP 140/70 | HR 95 | Temp 98.5°F | Resp 16 | Ht 63.0 in | Wt 177.8 lb

## 2015-07-08 DIAGNOSIS — Z23 Encounter for immunization: Secondary | ICD-10-CM

## 2015-07-08 DIAGNOSIS — S335XXA Sprain of ligaments of lumbar spine, initial encounter: Secondary | ICD-10-CM

## 2015-07-08 DIAGNOSIS — S63277A Dislocation of unspecified interphalangeal joint of left little finger, initial encounter: Secondary | ICD-10-CM | POA: Diagnosis not present

## 2015-07-08 DIAGNOSIS — S7002XA Contusion of left hip, initial encounter: Secondary | ICD-10-CM

## 2015-07-08 DIAGNOSIS — M79602 Pain in left arm: Secondary | ICD-10-CM

## 2015-07-08 DIAGNOSIS — S41102A Unspecified open wound of left upper arm, initial encounter: Secondary | ICD-10-CM

## 2015-07-08 DIAGNOSIS — S60222A Contusion of left hand, initial encounter: Secondary | ICD-10-CM

## 2015-07-08 DIAGNOSIS — S63259A Unspecified dislocation of unspecified finger, initial encounter: Secondary | ICD-10-CM

## 2015-07-08 DIAGNOSIS — S00531A Contusion of lip, initial encounter: Secondary | ICD-10-CM

## 2015-07-08 NOTE — Patient Instructions (Signed)
You dislocated your little finger, but we put it back in place.   Leave the splint in place for one week.  Following that, "buddy tape" the little finger to the 4th finger for 3 weeks.   Otherwise it seems that you are bruised and "banged up" but I do not see any fracture However please let me know if you are not improving or if you seem to be getting worse, or if you have any other concerns Ice will help with your lip swelling

## 2015-07-08 NOTE — Progress Notes (Signed)
[facility] [address] [place] [address] [phone]  Date:  07/08/2015   Name:  [name] [name]   DOB:  1934/09/24   MRN:  [id number]  PCP:  [name], MD    Chief Complaint: Facial Swelling; Hand Pain; and Back Pain   History of Present Illness:  [name] [name] is a 80 y.o. very pleasant female patient who presents with the following:  Established pt here today to evaluate injuries from a fall that occurred yesterday.   She was carrying an old microwave to the street when she tripped and fell.  A neighbor helped her get up and back inside.  She thinks that her left hand was under the microwave when she fell on it. She hurt her left hand- esp the pinky finger, also has a fat lip.  Her left pinky was "flapping" after she fell- she splinted it with part of a plastic knife and scotch tape.   She has had some lower back trouble for some time but worse yesterday  She hit her lip and chin on the ground but otherwise no head injury.   She is not on blood thinners except aspirin No abdominal injury  Patient Active Problem List   Diagnosis Date Noted  . Urinary incontinence 06/02/2013  . Nocturia 06/02/2013  . Rectocele 06/02/2013  . Vaginal atrophy 06/02/2013  . Sebaceous cyst of labia 05/01/2013  . Diabetes mellitus   . Reflux   . Sleep apnea     Past Medical History  Diagnosis Date  . Endometrial polyp   . Endocervical polyp   . Diabetes mellitus   . Diverticulosis   . Hyponatremia   . Hypertension   . Hyperlipidemia   . GERD (gastroesophageal reflux disease)   . DDD (degenerative disc disease), lumbar   . Hiatal hernia   . Hx of adenomatous colonic polyps     Past Surgical History  Procedure Laterality Date  . Cholecystectomy    . Dilation and curettage of uterus    . Hysteroscopy    . Tonsillectomy      Social History  Substance Use Topics  . Smoking status: Never Smoker   . Smokeless tobacco: Never Used  .  Alcohol Use: No    Family History  Problem Relation Age of Onset  . Hypertension Mother   . Stroke Mother   . Diabetes Sister     x 2  . Breast cancer Cousin     Mat. 1st cousin-Age 33  . Lung cancer Brother     mets  . Alcohol abuse Father     Allergies  Allergen Reactions  . Prednisone     REACTION: lethargic    Medication list has been reviewed and updated.  Current Outpatient Prescriptions on File Prior to Visit  Medication Sig Dispense Refill  . acetaminophen (TYLENOL) 325 MG tablet Take 650 mg by mouth every 6 (six) hours as needed for pain.    [name] aspirin 81 MG tablet Take 81 mg by mouth 2 (two) times daily.     [name] glipiZIDE (GLUCOTROL) 10 MG tablet Take 10 mg by mouth 2 (two) times daily before a meal.    . Lansoprazole (PREVACID PO) Take 1 tablet by mouth daily before supper.     . losartan (COZAAR) 50 MG tablet Take 50 mg by mouth daily.    . metFORMIN (GLUCOPHAGE) 1000 MG tablet Take 1,000 mg by mouth daily.    . cephALEXin (KEFLEX)  500 MG capsule Take 1 capsule (500 mg total) by mouth 3 (three) times daily. (Patient not taking: Reported on 07/08/2015) 30 capsule 0  . clotrimazole-betamethasone (LOTRISONE) cream Apply 1 application topically 2 (two) times daily. (Patient not taking: Reported on 07/08/2015) 45 g 0  . mupirocin cream (BACTROBAN) 2 % Apply 1 application topically 3 (three) times daily. (Patient not taking: Reported on 07/08/2015) 15 g 0  . Red Yeast Rice Extract 600 MG CAPS Take by mouth.    . terconazole (TERAZOL 7) 0.4 % vaginal cream PLACE 1 APPLICATOR VAGINALLY AT BEDTIME. (Patient not taking: Reported on 07/08/2015) 45 g 0   No current facility-administered medications on file prior to visit.    Review of Systems:  As per HPI- otherwise negative.   Physical Examination: Filed Vitals:   07/08/15 1056  BP: 140/70  Pulse: 95  Temp: 98.5 F (36.9 C)  Resp: 16   Filed Vitals:   07/08/15 1056  Height: 5\' 3"  (1.6 m)  Weight: 177 lb 12.8 oz  (80.65 kg)   Body mass index is 31.5 kg/(m^2). Ideal Body Weight: Weight in (lb) to have BMI = 25: 140.8  GEN: WDWN, NAD, Non-toxic, A & O x 3, overweight, spry for age 29: Atraumatic, Normocephalic. Neck supple. No masses, No LAD.  Bilateral TM wnl, oropharynx normal.  PEERL,EOMI.   The inside of her lower lip is abraded but not lacerated.  The lower lip is bruised and contused.  No damage to teeth No cervical spine tenderness, normal cervical ROM Ears and Nose: No external deformity. CV: RRR, No M/G/R. No JVD. No thrill. No extra heart sounds. PULM: CTA B, no wheezes, crackles, rhonchi. No retractions. No resp. distress. No accessory muscle use. ABD: S, NT, ND EXTR: No c/c/e NEURO Normal gait.  PSYCH: Normally interactive. Conversant. Not depressed or anxious appearing.  Calm demeanor.  Left elbow and wrist negative.  The index and longer fingers are bruised but have normal ROM and minimal tenderness.  The pinky finger appears foreshortened and cannot bend at the PIP joint.  Finger is NV intact The left forearm is scraped and abraded but no laceration. The left lower back is tender to palpation and with movement over the paraspinous muscles.  Able to rotate and flex left hip without pain  [facility] reading (PRIMARY) by  Dr. Lorelei Pont. L spine: severe degenerative change but no fracture noted Left hip: mild degenerative change, no fracture noted  Left hand: left small finger is dislocated at the PIP joint  LUMBAR SPINE - COMPLETE 4+ VIEW COMPARISON: 01/05/2013  FINDINGS: Vertebral body alignment and heights are within normal. There is moderate spondylosis throughout the lumbar spine. There is mild disc space narrowing throughout the lumbar spine most prominent at the L4-5 and L5-S1 levels with slight progression. No compression fracture or subluxation. Facet arthropathy is present throughout the lumbar spine.  There are moderate degenerative changes of the hips.  IMPRESSION: No  acute findings. Moderate spondylosis throughout the lumbar spine with multilevel disc disease worse at the L4-5 and L5-S1 levels demonstrating slight progression. Moderate degenerative change of the hips.  DG HIP (WITH OR WITHOUT PELVIS) 2-3V LEFT  COMPARISON: None.  FINDINGS: Degenerative changes of left hip joint are noted. No acute fracture or dislocation is seen. Pelvic ring is intact. Degenerative changes of lumbar spine are noted.  IMPRESSION: No acute abnormality noted.  LEFT HAND - COMPLETE 3+ VIEW  COMPARISON: None.  FINDINGS: Dorsal dislocation of the fifth PIP joint. 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 Moderate joint degenerative change at the base of the thumb. IMPRESSION: Dislocation fifth PIP joint  Reduction of dislocation procedure-  VC obtained.  Prepped 5th MC head with betadine and alcohol.  Performed 5th digit metacarpal block with 24ml of 2% lidicaine plain.  Used pressure and traction to relocate small finger at the PIP joint.  Successful dislocation.  Pt had normal ROM after procedure.  Follow-up film confirmed successful reduction.  Finger splinted and buddy taped   Left small finger film: dislocation reduced   LEFT LITTLE FINGER 2+V  COMPARISON: LEFT hand radiographs 07/08/2015  FINDINGS: Osseous demineralization. Scattered narrowing of interphalangeal joints. Dorsal PIP dislocation seen on previous exam reduced. No definite fracture fragments identified though the PIP joint is partially obscured on lateral view due to superimposition of the remaining fingers. Associated soft tissue swelling. No definite fracture or bone destruction seen.  IMPRESSION: Reduction of PIP dislocation LEFT little finger without definite visualization of fracture as above.  Assessment and Plan: Pain of left upper extremity - Plan: DG Hand Complete Left  Contusion of left hand, initial encounter - Plan: DG Hand  Complete Left  Contusion, lip, initial encounter  Sprain lumbar region, initial encounter - Plan: DG Lumbar Spine Complete  Contusion of left hip, initial encounter - Plan: DG HIP UNILAT W OR W/O PELVIS 2-3 VIEWS LEFT  Open wound of left upper arm, initial encounter - Plan: Td vaccine greater than or equal to 7yo preservative free IM  Finger dislocation, initial encounter - Plan: DG Finger Little Left  Here today following a fall yesterday Lower lip contusion should do well Abrasions on arms do not require any closure- boosted tetanus shot  Reduced little finger dislocation, splint for one week and then buddy tape; if any other concerns follow-up Severe lumbar degenerative change, left hip tenderness.  No evidence of acute fracture Asked to follow-up if not improving in the coming days as expected  Signed Lamar Blinks, MD

## 2015-08-25 ENCOUNTER — Ambulatory Visit (INDEPENDENT_AMBULATORY_CARE_PROVIDER_SITE_OTHER): Payer: Medicare Other | Admitting: Gynecology

## 2015-08-25 ENCOUNTER — Encounter: Payer: Self-pay | Admitting: Gynecology

## 2015-08-25 VITALS — BP 134/86 | Ht 62.0 in | Wt 177.0 lb

## 2015-08-25 DIAGNOSIS — N952 Postmenopausal atrophic vaginitis: Secondary | ICD-10-CM

## 2015-08-25 DIAGNOSIS — D251 Intramural leiomyoma of uterus: Secondary | ICD-10-CM | POA: Diagnosis not present

## 2015-08-25 DIAGNOSIS — Z01419 Encounter for gynecological examination (general) (routine) without abnormal findings: Secondary | ICD-10-CM | POA: Diagnosis not present

## 2015-08-25 DIAGNOSIS — Z780 Asymptomatic menopausal state: Secondary | ICD-10-CM | POA: Diagnosis not present

## 2015-08-25 DIAGNOSIS — R102 Pelvic and perineal pain: Secondary | ICD-10-CM | POA: Diagnosis not present

## 2015-08-25 NOTE — Progress Notes (Signed)
[name] [name] August 02, 1935 [id number]   History:    79 y.o.  for annual gyn exam who stated that several nights ago she was awoken with pain in her left lower abdomen but did not seek medical attention. Review of her record indicated she does have a history of a small fibroid uterus was noted last year on ultrasound that was done because of her vaginismus an abdominal girth making it difficult for pelvic assessment. She denies any vaginal bleeding. She is on no hormonal replacement therapy. Dr. [name] is her PCP is been doing her blood work. She states all her vaccines are up-to-date. She does have history of colon polyps in her last colonoscopy was in 2011. Patient with no past history of any abnormal Pap smear and her last bone density study was normal in 2009.  Past medical history,surgical history, family history and social history were all reviewed and documented in the EPIC chart.  Gynecologic History No LMP recorded. Patient is postmenopausal. Contraception: post menopausal status Last Pap: 2013. Results were: normal Last mammogram: 2016. Results were: normal  Obstetric History OB History  Gravida Para Term Preterm AB SAB TAB Ectopic Multiple Living  1 1 1       1     # Outcome Date GA Lbr Len/2nd Weight Sex Delivery Anes PTL Lv  1 Term                ROS: A ROS was performed and pertinent positives and negatives are included in the history.  GENERAL: No fevers or chills. HEENT: No change in vision, no earache, sore throat or sinus congestion. NECK: No pain or stiffness. CARDIOVASCULAR: No chest pain or pressure. No palpitations. PULMONARY: No shortness of breath, cough or wheeze. GASTROINTESTINAL: No abdominal pain, nausea, vomiting or diarrhea, melena or bright red blood per rectum. GENITOURINARY: No urinary frequency, urgency, hesitancy or dysuria. MUSCULOSKELETAL: No joint or muscle pain, no back pain, no recent trauma. DERMATOLOGIC: No rash, no itching, no lesions.  ENDOCRINE: No polyuria, polydipsia, no heat or cold intolerance. No recent change in weight. HEMATOLOGICAL: No anemia or easy bruising or bleeding. NEUROLOGIC: No headache, seizures, numbness, tingling or weakness. PSYCHIATRIC: No depression, no loss of interest in normal activity or change in sleep pattern.     Exam: chaperone present  BP 134/86 mmHg  Ht 5\' 2"  (1.575 m)  Wt 177 lb (80.287 kg)  BMI 32.37 kg/m2  Body mass index is 32.37 kg/(m^2).  General appearance : Well developed well nourished female. No acute distress HEENT: Eyes: no retinal hemorrhage or exudates,  Neck supple, trachea midline, no carotid bruits, no thyroidmegaly Lungs: Clear to auscultation, no rhonchi or wheezes, or rib retractions  Heart: Regular rate and rhythm, no murmurs or gallops Breast:Examined in sitting and supine position were symmetrical in appearance, no palpable masses or tenderness,  no skin retraction, no nipple inversion, no nipple discharge, no skin discoloration, no axillary or supraclavicular lymphadenopathy Abdomen: no palpable masses or tenderness, no rebound or guarding Extremities: no edema or skin discoloration or tenderness  Pelvic:  Bartholin, Urethra, Skene Glands: Within normal limits             Vagina: No gross lesions or discharge  Cervix: No gross lesions or discharge  Uterus Limited exam due to patient's abdominal girth and narrow vagina  Adnexa  same as above  Anus and perineum  normal   Rectovaginal  normal sphincter tone without palpated masses or tenderness  Hemoccult PCP provides     Assessment/Plan:  80 y.o. female for annual exam who return back to the office in 1-2 weeks for pelvic ultrasound for better assessment of her uterus and adnexa especially her recent episode of left lower quadrant pain. Also to monitor the size of her fibroid uterus. Pap smear not indicated according to the new guidelines. She will schedule her overdue for bone density study for  January. She was reminded to contact her gastroenterologist as it has been 5 years since her last colonoscopy especially with her history of benign polyps had been removed in the past.   Terrance Mass MD, 11:14 AM 08/25/2015

## 2015-08-25 NOTE — Patient Instructions (Signed)

## 2015-08-25 NOTE — Addendum Note (Signed)
Addended by: Terrance Mass on: 08/25/2015 11:12 AM   Modules accepted: Orders, Level of Service

## 2015-09-23 ENCOUNTER — Encounter: Payer: Self-pay | Admitting: Gynecology

## 2015-09-23 ENCOUNTER — Ambulatory Visit (INDEPENDENT_AMBULATORY_CARE_PROVIDER_SITE_OTHER): Payer: Medicare Other | Admitting: Gynecology

## 2015-09-23 ENCOUNTER — Ambulatory Visit (INDEPENDENT_AMBULATORY_CARE_PROVIDER_SITE_OTHER): Payer: Medicare Other

## 2015-09-23 VITALS — BP 134/80

## 2015-09-23 DIAGNOSIS — D251 Intramural leiomyoma of uterus: Secondary | ICD-10-CM

## 2015-09-23 DIAGNOSIS — R102 Pelvic and perineal pain: Secondary | ICD-10-CM

## 2015-09-23 NOTE — Patient Instructions (Signed)
Uterine Fibroids Uterine fibroids are tissue masses (tumors) that can develop in the womb (uterus). They are also called leiomyomas. This type of tumor is not cancerous (benign) and does not spread to other parts of the body outside of the pelvic area, which is between the hip bones. Occasionally, fibroids may develop in the fallopian tubes, in the cervix, or on the support structures (ligaments) that surround the uterus. You can have one or many fibroids. Fibroids can vary in size, weight, and where they grow in the uterus. Some can become quite large. Most fibroids do not require medical treatment. CAUSES A fibroid can develop when a single uterine cell keeps growing (replicating). Most cells in the human body have a control mechanism that keeps them from replicating without control. SIGNS AND SYMPTOMS Symptoms may include:   Heavy bleeding during your period.  Bleeding or spotting between periods.  Pelvic pain and pressure.  Bladder problems, such as needing to urinate more often (urinary frequency) or urgently.  Inability to reproduce offspring (infertility).  Miscarriages. DIAGNOSIS Uterine fibroids are diagnosed through a physical exam. Your health care provider may feel the lumpy tumors during a pelvic exam. Ultrasonography and an MRI may be done to determine the size, location, and number of fibroids. TREATMENT Treatment may include:  Watchful waiting. This involves getting the fibroid checked by your health care provider to see if it grows or shrinks. Follow your health care provider's recommendations for how often to have this checked.  Hormone medicines. These can be taken by mouth or given through an intrauterine device (IUD).  Surgery.  Removing the fibroids (myomectomy) or the uterus (hysterectomy).  Removing blood supply to the fibroids (uterine artery embolization). If fibroids interfere with your fertility and you want to become pregnant, your health care provider  may recommend having the fibroids removed.  HOME CARE INSTRUCTIONS  Keep all follow-up visits as directed by your health care provider. This is important.  Take medicines only as directed by your health care provider.  If you were prescribed a hormone treatment, take the hormone medicines exactly as directed.  Do not take aspirin, because it can cause bleeding.  Ask your health care provider about taking iron pills and increasing the amount of dark green, leafy vegetables in your diet. These actions can help to boost your blood iron levels, which may be affected by heavy menstrual bleeding.  Pay close attention to your period and tell your health care provider about any changes, such as:  Increased blood flow that requires you to use more pads or tampons than usual per month.  A change in the number of days that your period lasts per month.  A change in symptoms that are associated with your period, such as abdominal cramping or back pain. SEEK MEDICAL CARE IF:  You have pelvic pain, back pain, or abdominal cramps that cannot be controlled with medicines.  You have an increase in bleeding between and during periods.  You soak tampons or pads in a half hour or less.  You feel lightheaded, extra tired, or weak. SEEK IMMEDIATE MEDICAL CARE IF:  You faint.  You have a sudden increase in pelvic pain.   This information is not intended to replace advice given to you by your health care provider. Make sure you discuss any questions you have with your health care provider.   Document Released: 08/31/2000 Document Revised: 09/24/2014 Document Reviewed: 03/02/2014 Elsevier Interactive Patient Education 2016 [employer]  

## 2015-09-23 NOTE — Progress Notes (Signed)
   Patient is a 80 year old was seen in the office on December 8 for annual gynecological examination. At that time she had mentioned that several nights prior to that last visit she was awoken with pain or left lower abdomen but did not seek medical attention. Since that time symptoms have not recurred. She does have a past history of a fibroid uterus so she was asked to have an ultrasound which she's here today to discuss. Patient does have history of colon polyps in her last colonoscopy was reported to be in 2011. Patient was asymptomatic today.  Exam: Uterus measured 5.2 x 4.0 x 2.5 cm. Unable to identify the endometrial cavity. A small intramural myoma measuring 11 x 10 mm was noted. Right and left ovary not seen. No fluid in cul-de-sac were no adnexal masses.  Assessment/plan: Postmenopausal patient with insignificant small intramural myoma. Otherwise normal uterus with the exception of ovaries not identified probably due to the fact that there are atrophic and she 81 years of age. Patient with no recurrence of her left lower quadrant pain. She was reminded to follow-up with her primary care physician and or gastroenterologist since she's had history of colon polyps in the past. She reports no hematochezia.

## 2015-09-29 ENCOUNTER — Ambulatory Visit (INDEPENDENT_AMBULATORY_CARE_PROVIDER_SITE_OTHER): Payer: Medicare Other

## 2015-09-29 ENCOUNTER — Other Ambulatory Visit: Payer: Self-pay | Admitting: Gynecology

## 2015-09-29 DIAGNOSIS — Z780 Asymptomatic menopausal state: Secondary | ICD-10-CM | POA: Diagnosis not present

## 2015-09-29 DIAGNOSIS — Z13820 Encounter for screening for osteoporosis: Secondary | ICD-10-CM | POA: Diagnosis not present

## 2015-10-04 ENCOUNTER — Other Ambulatory Visit: Payer: Self-pay

## 2015-10-04 DIAGNOSIS — Z1231 Encounter for screening mammogram for malignant neoplasm of breast: Secondary | ICD-10-CM

## 2015-11-07 ENCOUNTER — Ambulatory Visit
Admission: RE | Admit: 2015-11-07 | Discharge: 2015-11-07 | Disposition: A | Discharge status: Home / Self Care | Payer: Medicare Other | Source: Ambulatory Visit

## 2015-11-07 DIAGNOSIS — Z1231 Encounter for screening mammogram for malignant neoplasm of breast: Secondary | ICD-10-CM

## 2015-12-24 ENCOUNTER — Encounter (HOSPITAL_BASED_OUTPATIENT_CLINIC_OR_DEPARTMENT_OTHER): Payer: Self-pay | Admitting: *Deleted

## 2015-12-24 ENCOUNTER — Emergency Department (HOSPITAL_BASED_OUTPATIENT_CLINIC_OR_DEPARTMENT_OTHER): Payer: Medicare Other

## 2015-12-24 ENCOUNTER — Emergency Department (HOSPITAL_BASED_OUTPATIENT_CLINIC_OR_DEPARTMENT_OTHER)
Admission: EM | Admit: 2015-12-24 | Discharge: 2015-12-24 | Disposition: A | Discharge status: Home / Self Care | Payer: Medicare Other | Attending: Emergency Medicine | Admitting: Emergency Medicine

## 2015-12-24 DIAGNOSIS — W19XXXA Unspecified fall, initial encounter: Secondary | ICD-10-CM | POA: Insufficient documentation

## 2015-12-24 DIAGNOSIS — Y999 Unspecified external cause status: Secondary | ICD-10-CM | POA: Insufficient documentation

## 2015-12-24 DIAGNOSIS — S0511XA Contusion of eyeball and orbital tissues, right eye, initial encounter: Secondary | ICD-10-CM | POA: Diagnosis not present

## 2015-12-24 DIAGNOSIS — S0512XA Contusion of eyeball and orbital tissues, left eye, initial encounter: Secondary | ICD-10-CM | POA: Insufficient documentation

## 2015-12-24 DIAGNOSIS — I10 Essential (primary) hypertension: Secondary | ICD-10-CM | POA: Diagnosis not present

## 2015-12-24 DIAGNOSIS — Z79899 Other long term (current) drug therapy: Secondary | ICD-10-CM | POA: Diagnosis not present

## 2015-12-24 DIAGNOSIS — Y939 Activity, unspecified: Secondary | ICD-10-CM | POA: Diagnosis not present

## 2015-12-24 DIAGNOSIS — E119 Type 2 diabetes mellitus without complications: Secondary | ICD-10-CM | POA: Insufficient documentation

## 2015-12-24 DIAGNOSIS — M25561 Pain in right knee: Secondary | ICD-10-CM | POA: Diagnosis not present

## 2015-12-24 DIAGNOSIS — Z7984 Long term (current) use of oral hypoglycemic drugs: Secondary | ICD-10-CM | POA: Diagnosis not present

## 2015-12-24 DIAGNOSIS — E785 Hyperlipidemia, unspecified: Secondary | ICD-10-CM | POA: Diagnosis not present

## 2015-12-24 DIAGNOSIS — H209 Unspecified iridocyclitis: Secondary | ICD-10-CM

## 2015-12-24 DIAGNOSIS — Y92481 Parking lot as the place of occurrence of the external cause: Secondary | ICD-10-CM | POA: Diagnosis not present

## 2015-12-24 DIAGNOSIS — S0591XA Unspecified injury of right eye and orbit, initial encounter: Secondary | ICD-10-CM | POA: Diagnosis present

## 2015-12-24 MED ORDER — ACETAMINOPHEN 500 MG PO TABS
1000.0000 mg | ORAL_TABLET | Freq: Once | ORAL | Status: AC
  Administered 2015-12-24: 1000 mg via ORAL
  Filled 2015-12-24: qty 2

## 2015-12-24 MED ORDER — TETRACAINE HCL 0.5 % OP SOLN
1.0000 [drp] | Freq: Once | OPHTHALMIC | Status: AC
  Administered 2015-12-24: 1 [drp] via OPHTHALMIC
  Filled 2015-12-24: qty 4

## 2015-12-24 MED ORDER — FLUORESCEIN SODIUM 1 MG OP STRP
1.0000 | ORAL_STRIP | Freq: Once | OPHTHALMIC | Status: AC
  Administered 2015-12-24: 1 via OPHTHALMIC
  Filled 2015-12-24: qty 1

## 2015-12-24 NOTE — ED Notes (Signed)
Patient fell yesterday in the [facility] parking lot and hit, face bruised & abrasion to nose. R knee bruised

## 2015-12-24 NOTE — Discharge Instructions (Signed)
Follow-up with ophthalmologist more morning. He continue at 945. He wants you to wear an eye shield overnight. Uveitis Uveitis is swelling and irritation (inflammation) in the eye. It often affects the middle part of the eye (uvea). This area contains many of the blood vessels that supply the rest of the eye. The uvea is made up of three structures:  The middle layer of the eyeball (choroid).  The colored part of the front of the eye (iris).  The connection between the iris and the choroid (ciliary body). Uveitis can affect any part of the uvea as well as other important structures in the eye. There are many types of uveitis:  Iritis, or anterior uveitis, affects the iris. This is the most common type. It can start suddenly and can last for many weeks.  Intermediate uveitis affects the structures in the middle of the eye. This includes the fluid that fills the eye (vitreous). This type can last for years and can come and go.  Posterior uveitis affects the structures in the back of the eye. This includes the light-sensitive layer of cells that is needed for vision (retina). This is the least common type.  Panuveitis affects all layers of the eye. Uveitis can affect one eye or both eyes. It can cause short-term or long-term symptoms. Symptoms may go away and come back. Over time, the condition can damage or destroy eye structures and can lead to vision loss. CAUSES This condition may be caused by:  Infections that start in the eye or spread to the eye.  Inflammatory diseases that can affect the eye.  Autoimmune diseases. These are diseases in which the body's defense system (immune system) mistakenly attacks the body's own tissues.  Eye injuries. In some cases, the cause may not be known. RISK FACTORS This condition is more likely to develop in people who are 69-64 years old. SYMPTOMS Symptoms of this condition depend on the type of uveitis. Common symptoms include:  Eye  redness.  Eye pain.  Blurred vision.  Decreased vision.  Floating dark spots in your vision (floaters).  Sensitivity to light.  A white spot in the lower part of the iris (hypopyon). DIAGNOSIS This condition is usually diagnosed by an eye specialist (ophthalmologist). The ophthalmologist will do a complete eye exam. This exam may include:  A vision test using eye charts.  An exam that involves using a scope for viewing inside the eye (ophthalmoscope or slit lamp). Eye drops may be used to widen (dilate) your pupil to make it easier to see inside your eye.  A test to measure eye pressure. You may have other medical tests to help determine the cause of your uveitis. TREATMENT Treatment for this condition may depend on the type of uveitis that you have. It should be started right away to help prevent vision loss. Treatment may include:  Medicine to block inflammation (steroids). You may get steroids:  As eye drops.  As an injection into your eye.  By mouth.  Eye drops to dilate the pupil and reduce pressure inside the eye.  In some cases, medicines may be given through an IV tube or through a device that is implanted inside the eye. HOME CARE INSTRUCTIONS  Take medicines only as directed by your health care provider. Use eye drops exactly as directed.  Follow instructions from your health care provider about any restrictions on your activities. Ask what activities are safe for you.  Do not use any tobacco products, including cigarettes, chewing tobacco,  or electronic cigarettes. If you need help quitting, ask your health care provider.  Keep all follow-up visits as directed by your health care provider. This is important. SEEK MEDICAL CARE IF:  Your medicines are not working. SEEK IMMEDIATE MEDICAL CARE IF:  You have redness in one eye or both eyes.  Your eyes are very sensitive to light.  You have pain or aching in either eye.  You have vision loss in either  eye.   This information is not intended to replace advice given to you by your health care provider. Make sure you discuss any questions you have with your health care provider.   Document Released: 11/30/2008 Document Revised: 01/18/2015 Document Reviewed: 09/08/2014 Elsevier Interactive Patient Education [employer].

## 2015-12-24 NOTE — ED Provider Notes (Signed)
CSN: [id number]     Arrival date & time 12/24/15  1441 History  By signing my name below, I, Altamease Oiler, attest that this documentation has been prepared under the direction and in the presence of Deno Etienne, DO. Electronically Signed: Altamease Oiler, ED Scribe. 12/24/2015. 7:12 PM   Chief Complaint  Patient presents with  . Fall   The history is provided by the patient and a relative. No language interpreter was used.   SHERLITA MENCHACA is a 81 y.o. female who presents to the Emergency Department complaining of a fall yesterday in the parking lot at [facility]. She is not sure why she fell and denies tripping. She had no headache, dizziness, chest pain, or SOB before the fall.  Associated symptoms include right eye swelling and pain, bruising at both eyes, and right knee pain. She is able to see out of the right eye when it is open. Pt denies dental injury, change in speech, and hip pain. She has been ambulatory since the fall. Pt takes 2 low-dose aspirins daily.   Past Medical History  Diagnosis Date  . Endometrial polyp   . Endocervical polyp   . Diabetes mellitus   . Diverticulosis   . Hyponatremia   . Hypertension   . Hyperlipidemia   . GERD (gastroesophageal reflux disease)   . DDD (degenerative disc disease), lumbar   . Hiatal hernia   . Hx of adenomatous colonic polyps    Past Surgical History  Procedure Laterality Date  . Cholecystectomy    . Dilation and curettage of uterus    . Hysteroscopy    . Tonsillectomy     Family History  Problem Relation Age of Onset  . Hypertension Mother   . Stroke Mother   . Diabetes Sister     x 2  . Breast cancer Cousin     Mat. 1st cousin-Age 72  . Lung cancer Brother     mets  . Alcohol abuse Father    Social History  Substance Use Topics  . Smoking status: Never Smoker   . Smokeless tobacco: Never Used  . Alcohol Use: No   OB History    Gravida Para Term Preterm AB TAB SAB Ectopic Multiple Living   1 1 1       1       Review of Systems  Constitutional: Negative for fever and chills.  HENT: Positive for facial swelling. Negative for congestion, dental problem and rhinorrhea.        Right eye swelling and pain  Bruising at both eyes  Eyes: Negative for redness and visual disturbance.  Respiratory: Negative for shortness of breath and wheezing.   Cardiovascular: Negative for chest pain and palpitations.  Gastrointestinal: Negative for nausea and vomiting.  Genitourinary: Negative for dysuria and urgency.  Musculoskeletal: Positive for arthralgias. Negative for myalgias.  Skin: Positive for color change. Negative for pallor and wound.  Neurological: Negative for dizziness, speech difficulty and headaches.    Allergies  Prednisone  Home Medications   Prior to Admission medications   Medication Sig Start Date End Date Taking? Authorizing Provider  acetaminophen (TYLENOL) 325 MG tablet Take 650 mg by mouth every 6 (six) hours as needed for pain.    Historical Provider, MD  aspirin 81 MG tablet Take 81 mg by mouth 2 (two) times daily.     Historical Provider, MD  glipiZIDE (GLUCOTROL) 10 MG tablet Take 10 mg by mouth 2 (two) times daily before a meal.  Historical Provider, MD  Lansoprazole (PREVACID PO) Take 1 tablet by mouth daily before supper.     Historical Provider, MD  losartan (COZAAR) 50 MG tablet Take 50 mg by mouth daily.    Historical Provider, MD  metFORMIN (GLUCOPHAGE) 1000 MG tablet Take 1,000 mg by mouth daily.    Historical Provider, MD  mupirocin cream (BACTROBAN) 2 % Apply 1 application topically 3 (three) times daily. Patient not taking: Reported on 07/08/2015 09/12/14   Robyn Haber, MD  Red Yeast Rice Extract 600 MG CAPS Take by mouth.    Historical Provider, MD  terconazole (TERAZOL 7) 0.4 % vaginal cream PLACE 1 APPLICATOR VAGINALLY AT BEDTIME. Patient not taking: Reported on 07/08/2015 05/26/13   Terrance Mass, MD   BP 159/72 mmHg  Pulse 78  Temp(Src) 98.1 F (36.7 C)  (Oral)  Resp 18  SpO2 100% Physical Exam  Constitutional: She is oriented to person, place, and time. She appears well-developed and well-nourished. No distress.  HENT:  Head: Normocephalic.  Significant bruising around both eyes Bony tenderness at the upper and lower rim of the right orbit No signs of intraoral trauma  Eyes: EOM are normal. Pupils are equal, round, and reactive to light.  Slit lamp exam:      The right eye shows no corneal abrasion, no corneal flare, no corneal ulcer, no foreign body, no hyphema, no fluorescein uptake and no anterior chamber bulge.       The left eye shows no corneal abrasion, no corneal flare, no corneal ulcer, no foreign body, no hyphema, no hypopyon, no fluorescein uptake and no anterior chamber bulge.    Neck: Normal range of motion. Neck supple.  Cardiovascular: Normal rate and regular rhythm.  Exam reveals no gallop and no friction rub.   No murmur heard. Pulmonary/Chest: Effort normal. She has no wheezes. She has no rales.  Breath sounds equal bilaterally   Abdominal: Soft. She exhibits no distension. There is no tenderness.  Musculoskeletal: She exhibits no edema or tenderness.  No midline spinal tenderness Mild bony TTP at medial aspect of right knee No other areas of TTP  Neurological: She is alert and oriented to person, place, and time. No cranial nerve deficit.  Cranial nerves intact   Skin: Skin is warm and dry. She is not diaphoretic.  Psychiatric: She has a normal mood and affect. Her behavior is normal.  Nursing note and vitals reviewed.   ED Course  Procedures (including critical care time) DIAGNOSTIC STUDIES: Oxygen Saturation is 100% on RA,  normal by my interpretation.    COORDINATION OF CARE: 3:49 PM Discussed treatment plan which includes CT head without contrast, CT maxillofacial without contrast, right knee XR, Fundoscopic exam, and Tylenol with pt at bedside and pt agreed to plan.  7:10 PM-Consult complete with Dr.  Kathlen Mody (Opthalmology). Patient case explained and discussed. Call ended at 7:12 PM  Labs Review Labs Reviewed - No data to display  Imaging Review Ct Head Wo Contrast  12/24/2015  CLINICAL DATA:  81 year old who fell in the [facility] parking lot from a standing height earlier today, striking the face. Bruising to both orbits, right greater than left, with the right eye swollen shut. Initial encounter. EXAM: CT HEAD WITHOUT CONTRAST CT MAXILLOFACIAL WITHOUT CONTRAST TECHNIQUE: Multidetector CT imaging of the head and maxillofacial structures were performed using the standard protocol without intravenous contrast. Multiplanar CT image reconstructions of the maxillofacial structures were also generated. COMPARISON:  None. FINDINGS: CT HEAD FINDINGS Moderate to severe  age related cortical and deep atrophy. Mild cerebellar atrophy. Mild changes of small vessel disease of the white matter. No mass lesion. No midline shift. No acute hemorrhage or hematoma. No extra-axial fluid collections. No evidence of acute infarction. Right frontal scalp hematoma without underlying skull fracture. Please see report of maxillofacial CT below for facial fractures. Bilateral mastoid air cells and middle ear cavities well-aerated. Moderate to severe bilateral carotid siphon and vertebral artery atherosclerosis. CT MAXILLOFACIAL FINDINGS Preseptal soft tissue hematoma anterior to the right orbit, the hematoma extending into the right cheek subcutaneous tissues. Facial bone fractures including: Right orbital floor, displaced into the right maxillary sinus, without evidence of inferior rectus muscle entrapment. Right nasal bone, minimally displaced. Temporomandibular joints intact with degenerative changes bony nasal septal deviation to the right. Bilateral concha bullosa. Mucosal thickening involving the right maxillary sinus without evidence of fluid level. Remaining paranasal sinuses well aerated. IMPRESSION: 1. No acute intracranial  abnormality. 2. Moderate to severe age related generalized atrophy and mild chronic microvascular ischemic changes of the white matter. 3. Right frontal scalp hematoma without underlying skull fracture. 4. Blowout fracture involving the right orbital floor. No evidence of inferior rectus muscle entrapment. 5. Minimally displaced fracture involving the right nasal bone. 6. No other facial bone fractures identified. Electronically Signed   By: Evangeline Dakin M.D.   On: 12/24/2015 18:35   Dg Knee Complete 4 Views Right  12/24/2015  CLINICAL DATA:  Status post fall today, bruising to right knee, right knee pain. EXAM: RIGHT KNEE - COMPLETE 4+ VIEW COMPARISON:  None. FINDINGS: Osseous demineralization/ osteopenia. No acute or suspicious osseous lesion. No fracture line or displaced fracture fragment seen. 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 Milder degenerative change at the medial and lateral compartments. Probable small joint effusion within the suprapatellar bursa. Surrounding soft tissues unremarkable. IMPRESSION: 1. No acute osseous abnormality.  No fracture or dislocation. 2. 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 Suspect that the small joint effusion within the suprapatellar bursa is chronic and related to the underlying DJD. Electronically Signed   By: Franki Cabot M.D.   On: 12/24/2015 17:21   Ct Maxillofacial Wo Cm  12/24/2015  CLINICAL DATA:  81 year old who fell in the [facility] parking lot from a standing height earlier today, striking the face. Bruising to both orbits, right greater than left, with the right eye swollen shut. Initial encounter. EXAM: CT HEAD WITHOUT CONTRAST CT MAXILLOFACIAL WITHOUT CONTRAST TECHNIQUE: Multidetector CT imaging of the head and maxillofacial structures were performed using the standard protocol without intravenous contrast. Multiplanar CT image reconstructions of the maxillofacial structures  were also generated. COMPARISON:  None. FINDINGS: CT HEAD FINDINGS Moderate to severe age related cortical and deep atrophy. Mild cerebellar atrophy. Mild changes of small vessel disease of the white matter. No mass lesion. No midline shift. No acute hemorrhage or hematoma. No extra-axial fluid collections. No evidence of acute infarction. Right frontal scalp hematoma without underlying skull fracture. Please see report of maxillofacial CT below for facial fractures. Bilateral mastoid air cells and middle ear cavities well-aerated. Moderate to severe bilateral carotid siphon and vertebral artery atherosclerosis. CT MAXILLOFACIAL FINDINGS Preseptal soft tissue hematoma anterior to the right orbit, the hematoma extending into the right cheek subcutaneous tissues. Facial bone fractures including: Right orbital floor, displaced into the right maxillary sinus, without evidence of inferior rectus muscle entrapment. Right nasal bone, minimally displaced. Temporomandibular joints intact with degenerative changes bony nasal septal deviation to the  right. Bilateral concha bullosa. Mucosal thickening involving the right maxillary sinus without evidence of fluid level. Remaining paranasal sinuses well aerated. IMPRESSION: 1. No acute intracranial abnormality. 2. Moderate to severe age related generalized atrophy and mild chronic microvascular ischemic changes of the white matter. 3. Right frontal scalp hematoma without underlying skull fracture. 4. Blowout fracture involving the right orbital floor. No evidence of inferior rectus muscle entrapment. 5. Minimally displaced fracture involving the right nasal bone. 6. No other facial bone fractures identified. Electronically Signed   By: Evangeline Dakin M.D.   On: 12/24/2015 18:35   I have personally reviewed and evaluated these images as part of my medical decision-making.   EKG Interpretation None      MDM   Final diagnoses:  Traumatic iritis    81 yo F With a  chief complaint of a fall. This was mechanical happened yesterday. Patient fell onto her face. Denies any visual complaints. On exam patient with significant tenderness around the right orbit. CT scan of the face is consistent with a blowout fracture. Not entrapment on my exam. Exam is concerning for some haziness of the cornea as well as some speckling. Patient has photophobia as well. Case was discussed with ophthalmology. They will see her first thing morning.  I personally performed the services described in this documentation, which was scribed in my presence. The recorded information has been reviewed and is accurate.    I have discussed the diagnosis/risks/treatment options with the patient and family and believe the pt to be eligible for discharge home to follow-up with Optho. We also discussed returning to the ED immediately if new or worsening sx occur. We discussed the sx which are most concerning (e.g., sudden worsening pain, change in vision) that necessitate immediate return. Medications administered to the patient during their visit and any new prescriptions provided to the patient are listed below.  Medications given during this visit Medications  tetracaine (PONTOCAINE) 0.5 % ophthalmic solution 1 drop (1 drop Right Eye Given 12/24/15 1600)  fluorescein ophthalmic strip 1 strip (1 strip Right Eye Given 12/24/15 1600)  acetaminophen (TYLENOL) tablet 1,000 mg (1,000 mg Oral Given 12/24/15 1600)    Discharge Medication List as of 12/24/2015  7:13 PM      The patient appears reasonably screen and/or stabilized for discharge and I doubt any other medical condition or other [facility] requiring further screening, evaluation, or treatment in the ED at this time prior to discharge.    Deno Etienne, DO 12/25/15 1552

## 2016-01-20 ENCOUNTER — Ambulatory Visit (INDEPENDENT_AMBULATORY_CARE_PROVIDER_SITE_OTHER): Payer: Medicare Other | Admitting: Emergency Medicine

## 2016-01-20 VITALS — BP 152/70 | HR 107 | Temp 98.8°F | Resp 18 | Ht 61.5 in | Wt 173.0 lb

## 2016-01-20 DIAGNOSIS — H00016 Hordeolum externum left eye, unspecified eyelid: Secondary | ICD-10-CM

## 2016-01-20 MED ORDER — ERYTHROMYCIN 5 MG/GM OP OINT: TOPICAL_OINTMENT | OPHTHALMIC | Status: DC

## 2016-01-20 NOTE — Patient Instructions (Addendum)
   IF you received an x-ray today, you will receive an invoice from [facility]. Please contact [facility] at [phone] with questions or concerns regarding your invoice.   IF you received labwork today, you will receive an invoice from [facility]. Please contact [facility] at [phone] with questions or concerns regarding your invoice.   Our billing staff will not be able to assist you with questions regarding bills from these companies.  You will be contacted with the lab results as soon as they are available. The fastest way to get your results is to activate your My Chart account. Instructions are located on the last page of this paperwork. If you have not heard from us regarding the results in 2 weeks, please contact this office.    Stye A stye is a bump on your eyelid caused by a bacterial infection. A stye can form inside the eyelid (internal stye) or outside the eyelid (external stye). An internal stye may be caused by an infected oil-producing gland inside your eyelid. An external stye may be caused by an infection at the base of your eyelash (hair follicle). Styes are very common. Anyone can get them at any age. They usually occur in just one eye, but you may have more than one in either eye.  CAUSES  The infection is almost always caused by bacteria called Staphylococcus aureus. This is a common type of bacteria that lives on your skin. RISK FACTORS You may be at higher risk for a stye if you have had one before. You may also be at higher risk if you have:  Diabetes.  Long-term illness.  Long-term eye redness.  A skin condition called seborrhea.  High fat levels in your blood (lipids). SIGNS AND SYMPTOMS  Eyelid pain is the most common symptom of a stye. Internal styes are more painful than external styes. Other signs and symptoms may include:  Painful swelling of your eyelid.  A scratchy feeling in your eye.  Tearing  and redness of your eye.  Pus draining from the stye. DIAGNOSIS  Your health care provider may be able to diagnose a stye just by examining your eye. The health care provider may also check to make sure:  You do not have a fever or other signs of a more serious infection.  The infection has not spread to other parts of your eye or areas around your eye. TREATMENT  Most styes will clear up in a few days without treatment. In some cases, you may need to use antibiotic drops or ointment to prevent infection. Your health care provider may have to drain the stye surgically if your stye is:  Large.  Causing a lot of pain.  Interfering with your vision. This can be done using a thin blade or a needle.  HOME CARE INSTRUCTIONS   Take medicines only as directed by your health care provider.  Apply a clean, warm compress to your eye for 10 minutes, 4 times a day.  Do not wear contact lenses or eye makeup until your stye has healed.  Do not try to pop or drain the stye. SEEK MEDICAL CARE IF:  You have chills or a fever.  Your stye does not go away after several days.  Your stye affects your vision.  Your eyeball becomes swollen, red, or painful. MAKE SURE YOU:  Understand these instructions.  Will watch your condition.  Will get help right away if you are not doing well or get   worse.   This information is not intended to replace advice given to you by your health care provider. Make sure you discuss any questions you have with your health care provider.   Document Released: 06/13/2005 Document Revised: 09/24/2014 Document Reviewed: 12/18/2013 Elsevier Interactive Patient Education 2016 [employer] Inc[employer]   

## 2016-01-20 NOTE — Progress Notes (Signed)
By signing my name below, I, Raven Small, attest that this documentation has been prepared under the direction and in the presence of Arlyss Queen, MD.  Electronically Signed: Thea Alken, ED Scribe. 01/20/2016. 1:15 PM.  Chief Complaint:  Chief Complaint  Patient presents with  . Eye Problem    possible stye, x 3 days, left    HPI: [name] [name] is a 81 y.o. female who reports to [facility] today complaining of a bump to left lower eyelid. Pt believes this is a stye. She reports waking up in the morning with a crust like substance around both eyes. She's also notice redness to the bottom of left eye. Pt has not had a stye in several years. Pt wears glasses and denies visual changes.   Pt also states she fell 1 week ago. As a result she has bruising around right eye and a knot about right eye.   Past Medical History  Diagnosis Date  . Endometrial polyp   . Endocervical polyp   . Diabetes mellitus   . Diverticulosis   . Hyponatremia   . Hypertension   . Hyperlipidemia   . GERD (gastroesophageal reflux disease)   . DDD (degenerative disc disease), lumbar   . Hiatal hernia   . Hx of adenomatous colonic polyps    Past Surgical History  Procedure Laterality Date  . Cholecystectomy    . Dilation and curettage of uterus    . Hysteroscopy    . Tonsillectomy     Social History   Social History  . Marital Status: Widowed    Spouse Name: N/A  . Number of Children: 1  . Years of Education: N/A   Occupational History  . retired    Social History Main Topics  . Smoking status: Never Smoker   . Smokeless tobacco: Never Used  . Alcohol Use: No  . Drug Use: No  . Sexual Activity: No   Other Topics Concern  . None   Social History Narrative   Family History  Problem Relation Age of Onset  . Hypertension Mother   . Stroke Mother   . Diabetes Sister     x 2  . Breast cancer Cousin     Mat. 1st cousin-Age 57  . Lung cancer Brother     mets  . Alcohol abuse Father     Allergies  Allergen Reactions  . Prednisone     REACTION: lethargic   Prior to Admission medications   Medication Sig Start Date End Date Taking? Authorizing Provider  acetaminophen (TYLENOL) 325 MG tablet Take 650 mg by mouth every 6 (six) hours as needed for pain.   Yes Historical Provider, MD  aspirin 81 MG tablet Take 81 mg by mouth 2 (two) times daily.    Yes Historical Provider, MD  glipiZIDE (GLUCOTROL) 10 MG tablet Take 10 mg by mouth 2 (two) times daily before a meal.   Yes Historical Provider, MD  Lansoprazole (PREVACID PO) Take 1 tablet by mouth daily before supper.    Yes Historical Provider, MD  losartan (COZAAR) 50 MG tablet Take 50 mg by mouth daily.   Yes Historical Provider, MD  Red Yeast Rice Extract 600 MG CAPS Take by mouth.   Yes Historical Provider, MD  metFORMIN (GLUCOPHAGE) 1000 MG tablet Take 1,000 mg by mouth daily. Reported on 01/20/2016    Historical Provider, MD  mupirocin cream (BACTROBAN) 2 % Apply 1 application topically 3 (three) times daily. Patient not taking: Reported on  07/08/2015 09/12/14   Robyn Haber, MD  terconazole (TERAZOL 7) 0.4 % vaginal cream PLACE 1 APPLICATOR VAGINALLY AT BEDTIME. Patient not taking: Reported on 07/08/2015 05/26/13   Terrance Mass, MD     ROS: The patient denies fevers, chills, night sweats, unintentional weight loss, chest pain, palpitations, wheezing, dyspnea on exertion, nausea, vomiting, abdominal pain, dysuria, hematuria, melena, numbness, weakness, or tingling.   All other systems have been reviewed and were otherwise negative with the exception of those mentioned in the HPI and as above.    PHYSICAL EXAM: Filed Vitals:   01/20/16 1237  BP: 152/70  Pulse: 107  Temp: 98.8 F (37.1 C)  Resp: 18   Body mass index is 32.16 kg/(m^2).   General: Alert, no acute distress HEENT:  Normocephalic, atraumatic, oropharynx patent. Eye: EOMI, PEERLDC bilateral cataract. 4x5 mm raised red area left lower lid with  purulence.  Cardiovascular:  Regular rate and rhythm, no rubs murmurs or gallops.  No Carotid bruits, radial pulse intact. No pedal edema.  Respiratory: Clear to auscultation bilaterally.  No wheezes, rales, or rhonchi.  No cyanosis, no use of accessory musculature Abdominal: No organomegaly, abdomen is soft and non-tender, positive bowel sounds.  No masses. Musculoskeletal: Gait intact. No edema, tenderness Skin: No rashes. Neurologic: Facial musculature symmetric. Psychiatric: Patient acts appropriately throughout our interaction. Lymphatic: No cervical or submandibular lymphadenopathy   LABS:    EKG/XRAY:   Primary read interpreted by Dr. Everlene Farrier at [facility].   ASSESSMENT/PLAN: Patient has a stye in her left eye. Will treat with erythromycin ointment as well as heat and massage.I personally performed the services described in this documentation, which was scribed in my presence. The recorded information has been reviewed and is accurate. I did recheck her heart rate and it was 96 and regular.I personally performed the services described in this documentation, which was scribed in my presence. The recorded information has been reviewed and is accurate.   Gross sideeffects, risk and benefits, and alternatives of medications d/w patient. Patient is aware that all medications have potential sideeffects and we are unable to predict every sideeffect or drug-drug interaction that may occur.  Arlyss Queen MD 01/20/2016 1:12 PM

## 2016-03-26 ENCOUNTER — Emergency Department (HOSPITAL_COMMUNITY)
Admission: EM | Admit: 2016-03-26 | Discharge: 2016-03-26 | Disposition: A | Discharge status: Home / Self Care | Payer: Medicare Other | Attending: Emergency Medicine | Admitting: Emergency Medicine

## 2016-03-26 ENCOUNTER — Emergency Department (HOSPITAL_COMMUNITY): Payer: Medicare Other

## 2016-03-26 ENCOUNTER — Encounter (HOSPITAL_COMMUNITY): Payer: Self-pay | Admitting: Emergency Medicine

## 2016-03-26 DIAGNOSIS — Z7984 Long term (current) use of oral hypoglycemic drugs: Secondary | ICD-10-CM | POA: Insufficient documentation

## 2016-03-26 DIAGNOSIS — E871 Hypo-osmolality and hyponatremia: Secondary | ICD-10-CM

## 2016-03-26 DIAGNOSIS — E119 Type 2 diabetes mellitus without complications: Secondary | ICD-10-CM | POA: Insufficient documentation

## 2016-03-26 DIAGNOSIS — Z7982 Long term (current) use of aspirin: Secondary | ICD-10-CM | POA: Insufficient documentation

## 2016-03-26 DIAGNOSIS — Z79899 Other long term (current) drug therapy: Secondary | ICD-10-CM | POA: Diagnosis not present

## 2016-03-26 DIAGNOSIS — I10 Essential (primary) hypertension: Secondary | ICD-10-CM | POA: Insufficient documentation

## 2016-03-26 DIAGNOSIS — Z86010 Personal history of colonic polyps: Secondary | ICD-10-CM | POA: Insufficient documentation

## 2016-03-26 DIAGNOSIS — M5136 Other intervertebral disc degeneration, lumbar region: Secondary | ICD-10-CM | POA: Insufficient documentation

## 2016-03-26 DIAGNOSIS — E785 Hyperlipidemia, unspecified: Secondary | ICD-10-CM | POA: Insufficient documentation

## 2016-03-26 DIAGNOSIS — R112 Nausea with vomiting, unspecified: Secondary | ICD-10-CM | POA: Diagnosis present

## 2016-03-26 DIAGNOSIS — Z5181 Encounter for therapeutic drug level monitoring: Secondary | ICD-10-CM | POA: Insufficient documentation

## 2016-03-26 DIAGNOSIS — E860 Dehydration: Secondary | ICD-10-CM | POA: Diagnosis not present

## 2016-03-26 LAB — CBC
HCT: 32 % — ABNORMAL LOW (ref 36.0–46.0)
Hemoglobin: 11.4 g/dL — ABNORMAL LOW (ref 12.0–15.0)
MCH: 29.1 pg (ref 26.0–34.0)
MCHC: 35.6 g/dL (ref 30.0–36.0)
MCV: 81.6 fL (ref 78.0–100.0)
Platelets: 217 10*3/uL (ref 150–400)
RBC: 3.92 MIL/uL (ref 3.87–5.11)
RDW: 13.5 % (ref 11.5–15.5)
WBC: 11.7 10*3/uL — ABNORMAL HIGH (ref 4.0–10.5)

## 2016-03-26 LAB — APTT: aPTT: 25 seconds (ref 24–37)

## 2016-03-26 LAB — I-STAT CHEM 8, ED
BUN: 35 mg/dL — AB (ref 6–20)
CHLORIDE: 92 mmol/L — AB (ref 101–111)
CREATININE: 1.1 mg/dL — AB (ref 0.44–1.00)
Calcium, Ion: 1.2 mmol/L (ref 1.12–1.23)
GLUCOSE: 149 mg/dL — AB (ref 65–99)
HCT: 34 % — ABNORMAL LOW (ref 36.0–46.0)
Hemoglobin: 11.6 g/dL — ABNORMAL LOW (ref 12.0–15.0)
POTASSIUM: 3.3 mmol/L — AB (ref 3.5–5.1)
Sodium: 126 mmol/L — ABNORMAL LOW (ref 135–145)
TCO2: 21 mmol/L (ref 0–100)

## 2016-03-26 LAB — COMPREHENSIVE METABOLIC PANEL
ALT: 29 U/L (ref 14–54)
AST: 44 U/L — ABNORMAL HIGH (ref 15–41)
Albumin: 3.1 g/dL — ABNORMAL LOW (ref 3.5–5.0)
Alkaline Phosphatase: 38 U/L (ref 38–126)
Anion gap: 10 (ref 5–15)
BUN: 40 mg/dL — ABNORMAL HIGH (ref 6–20)
CO2: 21 mmol/L — ABNORMAL LOW (ref 22–32)
Calcium: 8.7 mg/dL — ABNORMAL LOW (ref 8.9–10.3)
Chloride: 94 mmol/L — ABNORMAL LOW (ref 101–111)
Creatinine, Ser: 1.03 mg/dL — ABNORMAL HIGH (ref 0.44–1.00)
GFR calc Af Amer: 59 mL/min — ABNORMAL LOW (ref >60)
GFR calc non Af Amer: 51 mL/min — ABNORMAL LOW (ref >60)
Glucose, Bld: 153 mg/dL — ABNORMAL HIGH (ref 65–99)
Potassium: 3.3 mmol/L — ABNORMAL LOW (ref 3.5–5.1)
Sodium: 125 mmol/L — ABNORMAL LOW (ref 135–145)
Total Bilirubin: 1.1 mg/dL (ref 0.3–1.2)
Total Protein: 5.5 g/dL — ABNORMAL LOW (ref 6.5–8.1)

## 2016-03-26 LAB — URINE MICROSCOPIC-ADD ON: RBC / HPF: NONE SEEN RBC/hpf (ref 0–5)

## 2016-03-26 LAB — I-STAT TROPONIN, ED: Troponin i, poc: 0.02 ng/mL (ref 0.00–0.08)

## 2016-03-26 LAB — DIFFERENTIAL
Basophils Absolute: 0 10*3/uL (ref 0.0–0.1)
Basophils Relative: 0 %
Eosinophils Absolute: 0 10*3/uL (ref 0.0–0.7)
Eosinophils Relative: 0 %
LYMPHS ABS: 1.2 10*3/uL (ref 0.7–4.0)
LYMPHS PCT: 10 %
MONO ABS: 0.7 10*3/uL (ref 0.1–1.0)
Monocytes Relative: 6 %
NEUTROS ABS: 9.8 10*3/uL — AB (ref 1.7–7.7)
Neutrophils Relative %: 84 %

## 2016-03-26 LAB — URINALYSIS, ROUTINE W REFLEX MICROSCOPIC
Glucose, UA: NEGATIVE mg/dL
HGB URINE DIPSTICK: NEGATIVE
Ketones, ur: NEGATIVE mg/dL
Nitrite: NEGATIVE
PH: 6 (ref 5.0–8.0)
Protein, ur: NEGATIVE mg/dL
SPECIFIC GRAVITY, URINE: 1.021 (ref 1.005–1.030)

## 2016-03-26 LAB — PROTIME-INR
INR: 1.16 (ref 0.00–1.49)
PROTHROMBIN TIME: 14.9 s (ref 11.6–15.2)

## 2016-03-26 LAB — CK: CK TOTAL: 515 U/L — AB (ref 38–234)

## 2016-03-26 MED ORDER — ONDANSETRON 4 MG PO TBDP
4.0000 mg | ORAL_TABLET | Freq: Once | ORAL | Status: AC
  Administered 2016-03-26: 4 mg via ORAL
  Filled 2016-03-26: qty 1

## 2016-03-26 MED ORDER — ONDANSETRON HCL 4 MG PO TABS: 4.0000 mg | ORAL_TABLET | Freq: Four times a day (QID) | ORAL | Status: DC

## 2016-03-26 MED ORDER — SODIUM CHLORIDE 0.9 % IV BOLUS (SEPSIS)
1000.0000 mL | Freq: Once | INTRAVENOUS | Status: AC
  Administered 2016-03-26: 1000 mL via INTRAVENOUS

## 2016-03-26 MED ORDER — FAMOTIDINE 20 MG PO TABS: 20.0000 mg | ORAL_TABLET | Freq: Two times a day (BID) | ORAL | Status: DC

## 2016-03-26 NOTE — ED Provider Notes (Signed)
CSN: [id number]     Arrival date & time 03/26/16  1257 History   First MD Initiated Contact with Patient 03/26/16 1348     Chief Complaint  Patient presents with  . Hypotension   HPI Pt was having trouble with nausea and vomiting the past week.  Just once or twice per day.  No trouble with stomach pain or diarrhea.  She has not had a good appetite.  On Thursday, she fell when she was trying to get into bed.  She was not able to stand up on her own.  Family found her on Friday.  They have been trying to push foods and fluids but she has not had much appetite and continues to feel weak.  She went to her doctors office today and was sent to the ED. Past Medical History  Diagnosis Date  . Endometrial polyp   . Endocervical polyp   . Diabetes mellitus   . Diverticulosis   . Hyponatremia   . Hypertension   . Hyperlipidemia   . GERD (gastroesophageal reflux disease)   . DDD (degenerative disc disease), lumbar   . Hiatal hernia   . Hx of adenomatous colonic polyps    Past Surgical History  Procedure Laterality Date  . Cholecystectomy    . Dilation and curettage of uterus    . Hysteroscopy    . Tonsillectomy     Family History  Problem Relation Age of Onset  . Hypertension Mother   . Stroke Mother   . Diabetes Sister     x 2  . Breast cancer Cousin     Mat. 1st cousin-Age 75  . Lung cancer Brother     mets  . Alcohol abuse Father    Social History  Substance Use Topics  . Smoking status: Never Smoker   . Smokeless tobacco: Never Used  . Alcohol Use: No   OB History    Gravida Para Term Preterm AB TAB SAB Ectopic Multiple Living   1 1 1       1      Review of Systems  Constitutional: Negative for fever.  Respiratory: Negative for shortness of breath.   Cardiovascular: Negative for chest pain.  Gastrointestinal: Positive for nausea. Negative for constipation and blood in stool.  Genitourinary: Negative for dysuria.  Neurological: Positive for light-headedness. Negative  for weakness, numbness and headaches.      Allergies  Prednisone  Home Medications   Prior to Admission medications   Medication Sig Start Date End Date Taking? Authorizing Provider  acetaminophen (TYLENOL) 325 MG tablet Take 325 mg by mouth every 6 (six) hours as needed for moderate pain or headache.    Yes Historical Provider, MD  aspirin 81 MG tablet Take 81 mg by mouth 2 (two) times daily.    Yes Historical Provider, MD  glipiZIDE (GLUCOTROL XL) 10 MG 24 hr tablet Take 10 mg by mouth 2 (two) times daily. 03/14/16  Yes Historical Provider, MD  Lansoprazole (PREVACID PO) Take 1 tablet by mouth daily before supper.    Yes Historical Provider, MD  losartan (COZAAR) 100 MG tablet Take 100 mg by mouth daily. 03/19/16  Yes Historical Provider, MD  metFORMIN (GLUCOPHAGE) 1000 MG tablet Take 1,000 mg by mouth 2 (two) times daily. Reported on 01/20/2016   Yes Historical Provider, MD  Red Yeast Rice Extract 600 MG CAPS Take 1 capsule by mouth daily.    Yes Historical Provider, MD  erythromycin ophthalmic ointment Apply small amount to left  lower lid 4 times a day. Patient not taking: Reported on 03/26/2016 01/20/16   Darlyne Russian, MD   BP 125/75 mmHg  Pulse 90  Temp(Src) 97.6 F (36.4 C) (Oral)  Resp 22  SpO2 100% Physical Exam  Constitutional: No distress.  HENT:  Head: Normocephalic and atraumatic.  Right Ear: External ear normal.  Left Ear: External ear normal.  Mouth/Throat: No oropharyngeal exudate.  Eyes: Conjunctivae are normal. Right eye exhibits no discharge. Left eye exhibits no discharge. No scleral icterus.  Neck: Neck supple. No tracheal deviation present.  Cardiovascular: Normal rate, regular rhythm and intact distal pulses.   Pulmonary/Chest: Effort normal and breath sounds normal. No stridor. No respiratory distress. She has no wheezes. She has no rales.  Abdominal: Soft. Bowel sounds are normal. She exhibits no distension. There is no tenderness. There is no rebound and no  guarding.  Musculoskeletal: She exhibits no edema or tenderness.  Neurological: She is alert. No cranial nerve deficit (no facial droop, extraocular movements intact, no slurred speech) or sensory deficit. She exhibits normal muscle tone. She displays no seizure activity. Coordination normal.  Generalized weakness, trouble lifting legs off the bed but she attributes it to her arthritis, good plantar flexion bilaterally, nl grip strength bilaterally, able to lift arms off the bed, nl sensation  Skin: Skin is warm and dry. No rash noted. She is not diaphoretic.  Psychiatric: She has a normal mood and affect.  Nursing note and vitals reviewed.   ED Course  Procedures (including critical care time) Labs Review Labs Reviewed  CBC - Abnormal; Notable for the following:    WBC 11.7 (*)    Hemoglobin 11.4 (*)    HCT 32.0 (*)    All other components within normal limits  DIFFERENTIAL - Abnormal; Notable for the following:    Neutro Abs 9.8 (*)    All other components within normal limits  COMPREHENSIVE METABOLIC PANEL - Abnormal; Notable for the following:    Sodium 125 (*)    Potassium 3.3 (*)    Chloride 94 (*)    CO2 21 (*)    Glucose, Bld 153 (*)    BUN 40 (*)    Creatinine, Ser 1.03 (*)    Calcium 8.7 (*)    Total Protein 5.5 (*)    Albumin 3.1 (*)    AST 44 (*)    GFR calc non Af Amer 51 (*)    GFR calc Af Amer 59 (*)    All other components within normal limits  URINALYSIS, ROUTINE W REFLEX MICROSCOPIC (NOT AT [facility]) - Abnormal; Notable for the following:    Bilirubin Urine SMALL (*)    Leukocytes, UA TRACE (*)    All other components within normal limits  CK - Abnormal; Notable for the following:    Total CK 515 (*)    All other components within normal limits  URINE MICROSCOPIC-ADD ON - Abnormal; Notable for the following:    Squamous Epithelial / LPF 0-5 (*)    Bacteria, UA RARE (*)    All other components within normal limits  I-STAT CHEM 8, ED - Abnormal; Notable for  the following:    Sodium 126 (*)    Potassium 3.3 (*)    Chloride 92 (*)    BUN 35 (*)    Creatinine, Ser 1.10 (*)    Glucose, Bld 149 (*)    Hemoglobin 11.6 (*)    HCT 34.0 (*)    All other components within  normal limits  PROTIME-INR  APTT  I-STAT TROPOININ, ED    Imaging Review Dg Chest Portable 1 View  03/26/2016  CLINICAL DATA:  Weakness, hypotension EXAM: PORTABLE CHEST 1 VIEW COMPARISON:  06/03/2008 FINDINGS: Cardiomediastinal silhouette is stable. No acute infiltrate or pleural effusion. No pulmonary edema. Mild degenerative changes thoracic spine. Degenerative changes bilateral shoulders. IMPRESSION: No active disease. Electronically Signed   By: Lahoma Crocker M.D.   On: 03/26/2016 15:22   I have personally reviewed and evaluated these images and lab results as part of my medical decision-making.   EKG Interpretation   Date/Time:  Monday March 26 2016 13:27:59 EDT Ventricular Rate:  90 PR Interval:    QRS Duration: 80 QT Interval:  334 QTC Calculation: 409 R Axis:   69 Text Interpretation:  Sinus rhythm Anterior infarct, old Baseline wander  in lead(s) V2 V4 V5 V6 decreased qrs voltage, nonspecific st changes since  last tracing Confirmed by Jynesis Nakamura  MD-J, Adelfo Diebel [id number]) on 03/26/2016 2:13:37 PM      MDM   Final diagnoses:  Dehydration  Chronic hyponatremia    Pt's Labs show that she has a chronic hyponatremia. This is similar to sodium levels back in 2014. She does have a slight increase in her BUN and creatinine. This may be related to dehydration associated with her nausea and vomiting.  Patient has a mild anemia but no evidence of GI bleeding.  Creatinine kinase is elevated associated with her recent fall but no evidence of acute renal failure.  Chest x-ray does not show pneumonia and urine is not suggestive of a urinary tract infection.  Sx improved with IV fluids.  BP is normal.  Pt feels well enough to go home.  Patient's symptoms improved with IV hydration.  She was able stand without weakness or ataxia.  I'll discharge her home with prescription for antinausea medications. I will have her start antacids for possible GERD.  Follow up with her primary doctor later this week to be rechecked.  Dorie Rank, MD 03/26/16 [phone]

## 2016-03-26 NOTE — ED Notes (Signed)
With collection of orthostatic VS pt denies dizziness, lightheadedness, or other symptoms.

## 2016-03-26 NOTE — Discharge Instructions (Signed)

## 2016-03-26 NOTE — ED Notes (Addendum)
Per EMS pt from PCP sent for hypotension and further evaluation. Pt had a fall Thursday and was not found until Friday evening about 9pm. Pt was c/o nausea, vomiting and weakness a few days before fail but has no complaints at present time. BP at dr office was 80/40 and then 102/60 with EMS. Pt given 150 ml NS en route. Pt denies any blood thinners.

## 2016-03-26 NOTE — ED Notes (Signed)
Bed: RESA Expected date:  Expected time:  Means of arrival:  Comments: EMS- 81yo F, n/v x 5-6 days/hypotensive

## 2016-03-28 ENCOUNTER — Encounter (HOSPITAL_COMMUNITY): Payer: Self-pay

## 2016-03-28 ENCOUNTER — Emergency Department (HOSPITAL_COMMUNITY)
Admission: EM | Admit: 2016-03-28 | Discharge: 2016-03-29 | Disposition: A | Discharge status: Home / Self Care | Payer: Medicare Other | Attending: Emergency Medicine | Admitting: Emergency Medicine

## 2016-03-28 DIAGNOSIS — I10 Essential (primary) hypertension: Secondary | ICD-10-CM | POA: Diagnosis not present

## 2016-03-28 DIAGNOSIS — R531 Weakness: Secondary | ICD-10-CM | POA: Diagnosis not present

## 2016-03-28 DIAGNOSIS — R2689 Other abnormalities of gait and mobility: Secondary | ICD-10-CM | POA: Diagnosis not present

## 2016-03-28 DIAGNOSIS — Z7982 Long term (current) use of aspirin: Secondary | ICD-10-CM | POA: Diagnosis not present

## 2016-03-28 DIAGNOSIS — E11649 Type 2 diabetes mellitus with hypoglycemia without coma: Secondary | ICD-10-CM | POA: Diagnosis not present

## 2016-03-28 DIAGNOSIS — E876 Hypokalemia: Secondary | ICD-10-CM | POA: Insufficient documentation

## 2016-03-28 DIAGNOSIS — Z79899 Other long term (current) drug therapy: Secondary | ICD-10-CM | POA: Insufficient documentation

## 2016-03-28 DIAGNOSIS — R1013 Epigastric pain: Secondary | ICD-10-CM | POA: Insufficient documentation

## 2016-03-28 DIAGNOSIS — E162 Hypoglycemia, unspecified: Secondary | ICD-10-CM

## 2016-03-28 DIAGNOSIS — Z7984 Long term (current) use of oral hypoglycemic drugs: Secondary | ICD-10-CM | POA: Diagnosis not present

## 2016-03-28 DIAGNOSIS — R2681 Unsteadiness on feet: Secondary | ICD-10-CM

## 2016-03-28 LAB — COMPREHENSIVE METABOLIC PANEL
ALBUMIN: 3.7 g/dL (ref 3.5–5.0)
ALK PHOS: 43 U/L (ref 38–126)
ALT: 35 U/L (ref 14–54)
AST: 48 U/L — AB (ref 15–41)
Anion gap: 10 (ref 5–15)
BILIRUBIN TOTAL: 0.7 mg/dL (ref 0.3–1.2)
BUN: 18 mg/dL (ref 6–20)
CALCIUM: 9.2 mg/dL (ref 8.9–10.3)
CO2: 23 mmol/L (ref 22–32)
CREATININE: 0.77 mg/dL (ref 0.44–1.00)
Chloride: 97 mmol/L — ABNORMAL LOW (ref 101–111)
GFR calc Af Amer: >60 mL/min (ref >60)
GFR calc non Af Amer: >60 mL/min (ref >60)
GLUCOSE: 89 mg/dL (ref 65–99)
Potassium: 3 mmol/L — ABNORMAL LOW (ref 3.5–5.1)
SODIUM: 130 mmol/L — AB (ref 135–145)
Total Protein: 6.8 g/dL (ref 6.5–8.1)

## 2016-03-28 LAB — I-STAT CHEM 8, ED
BUN: 23 mg/dL — ABNORMAL HIGH (ref 6–20)
CALCIUM ION: 1.21 mmol/L (ref 1.12–1.23)
CHLORIDE: 94 mmol/L — AB (ref 101–111)
Creatinine, Ser: 0.8 mg/dL (ref 0.44–1.00)
GLUCOSE: 85 mg/dL (ref 65–99)
HCT: 40 % (ref 36.0–46.0)
Hemoglobin: 13.6 g/dL (ref 12.0–15.0)
Potassium: 3.2 mmol/L — ABNORMAL LOW (ref 3.5–5.1)
Sodium: 130 mmol/L — ABNORMAL LOW (ref 135–145)
TCO2: 27 mmol/L (ref 0–100)

## 2016-03-28 LAB — URINE MICROSCOPIC-ADD ON: RBC / HPF: NONE SEEN RBC/hpf (ref 0–5)

## 2016-03-28 LAB — CBC
HCT: 36.8 % (ref 36.0–46.0)
HEMOGLOBIN: 13.3 g/dL (ref 12.0–15.0)
MCH: 29.3 pg (ref 26.0–34.0)
MCHC: 36.1 g/dL — AB (ref 30.0–36.0)
MCV: 81.1 fL (ref 78.0–100.0)
PLATELETS: 251 10*3/uL (ref 150–400)
RBC: 4.54 MIL/uL (ref 3.87–5.11)
RDW: 13.8 % (ref 11.5–15.5)
WBC: 10.8 10*3/uL — ABNORMAL HIGH (ref 4.0–10.5)

## 2016-03-28 LAB — URINALYSIS, ROUTINE W REFLEX MICROSCOPIC
Bilirubin Urine: NEGATIVE
GLUCOSE, UA: 500 mg/dL — AB
Hgb urine dipstick: NEGATIVE
Ketones, ur: NEGATIVE mg/dL
Nitrite: NEGATIVE
PH: 6 (ref 5.0–8.0)
Protein, ur: NEGATIVE mg/dL
SPECIFIC GRAVITY, URINE: 1.009 (ref 1.005–1.030)

## 2016-03-28 LAB — CBG MONITORING, ED
GLUCOSE-CAPILLARY: 103 mg/dL — AB (ref 65–99)
Glucose-Capillary: 53 mg/dL — ABNORMAL LOW (ref 65–99)

## 2016-03-28 MED ORDER — DEXTROSE 50 % IV SOLN
1.0000 | Freq: Once | INTRAVENOUS | Status: AC
  Administered 2016-03-28: 50 mL via INTRAVENOUS
  Filled 2016-03-28: qty 50

## 2016-03-28 MED ORDER — SODIUM CHLORIDE 0.9 % IV SOLN
Freq: Once | INTRAVENOUS | Status: AC
  Administered 2016-03-28: 21:00:00 via INTRAVENOUS

## 2016-03-28 MED ORDER — SUCRALFATE 1 GM/10ML PO SUSP
1.0000 g | Freq: Three times a day (TID) | ORAL | Status: DC
  Administered 2016-03-28 – 2016-03-29 (×2): 1 g via ORAL
  Filled 2016-03-28 (×4): qty 10

## 2016-03-28 MED ORDER — POTASSIUM CHLORIDE 20 MEQ/15ML (10%) PO SOLN
40.0000 meq | Freq: Once | ORAL | Status: AC
  Administered 2016-03-28: 40 meq via ORAL
  Filled 2016-03-28: qty 30

## 2016-03-28 NOTE — ED Provider Notes (Signed)
CSN: [id number]     Arrival date & time 03/28/16  1958 History   By signing my name below, I, Randa Evens, attest that this documentation has been prepared under the direction and in the presence of Junius Creamer, NP. Electronically Signed: Randa Evens, ED Scribe. 03/28/2016. 8:53 PM.     Chief Complaint  Patient presents with  . Hypoglycemia    The history is provided by the patient and a relative. No language interpreter was used.   HPI Comments: [name] [name] is a 81 y.o. female brought in by ambulance, who presents to the Emergency Department complaining of hypotensive and hypoglycemic symptoms over the last 1 week. Family states that 6 days ago she fell and this is when her symptoms started. Family states that she layed on the floor for about 24 hours before anybody found her. Family states that she has a decreased appetite due to burning sensation on her throat and stomach. Family states that any time she eats she has pain and occasionally will vomit small amounts, but states that she is able to tolerate liquids. Family states that since the fall she has been very weak and fatigue. States she was evaluated at her PCP 2 days ago for hypotension. Family states she was diagnosed with hyponatremia and dehydration when evaluated in the ED on July 10,2017. Family states that she has been taking off all her HTN and DM medications except metformin. Family states that today her symptoms were beginning to improve until she had sudden onset of confusion, slurred speech and double vision. Family states that they called EMS and on arrival her CBG was 42. Ems gave her glucose and her CBG improved to 72. Family states that she is not taking in enough fluids daily. Family states that she may use the bathroom twice a day. Family states that she has never returned to baseline since the initial fall 6 days prior. Pt denies CP, SOB, dizziness, headache, or other related symptoms.      Past Medical  History  Diagnosis Date  . Endometrial polyp   . Endocervical polyp   . Diabetes mellitus   . Diverticulosis   . Hyponatremia   . Hypertension   . Hyperlipidemia   . GERD (gastroesophageal reflux disease)   . DDD (degenerative disc disease), lumbar   . Hiatal hernia   . Hx of adenomatous colonic polyps    Past Surgical History  Procedure Laterality Date  . Cholecystectomy    . Dilation and curettage of uterus    . Hysteroscopy    . Tonsillectomy     Family History  Problem Relation Age of Onset  . Hypertension Mother   . Stroke Mother   . Diabetes Sister     x 2  . Breast cancer Cousin     Mat. 1st cousin-Age 56  . Lung cancer Brother     mets  . Alcohol abuse Father    Social History  Substance Use Topics  . Smoking status: Never Smoker   . Smokeless tobacco: Never Used  . Alcohol Use: No   OB History    Gravida Para Term Preterm AB TAB SAB Ectopic Multiple Living   1 1 1       1       Review of Systems  Constitutional: Positive for activity change and appetite change. Negative for fever.  Eyes: Positive for visual disturbance.  Cardiovascular: Negative for chest pain and leg swelling.  Gastrointestinal: Positive for nausea and abdominal pain.  Genitourinary: Negative for dysuria.  Musculoskeletal: Negative for myalgias.  Neurological: Positive for speech difficulty and weakness.  All other systems reviewed and are negative.    Allergies  Prednisone  Home Medications   Prior to Admission medications   Medication Sig Start Date End Date Taking? Authorizing Provider  aspirin 81 MG tablet Take 162 mg by mouth at bedtime.    Yes Historical Provider, MD  aspirin-sod bicarb-citric acid (ALKA-SELTZER) 325 MG TBEF tablet Take 325 mg by mouth every 6 (six) hours as needed (acid reflux).   Yes Historical Provider, MD  Cholecalciferol (VITAMIN D) 2000 units tablet Take 2,000 Units by mouth daily.   Yes Historical Provider, MD  CINNAMON PO Take 1,000 mg by mouth  daily.   Yes Historical Provider, MD  glipiZIDE (GLUCOTROL XL) 10 MG 24 hr tablet Take 10 mg by mouth 2 (two) times daily. 03/14/16  Yes Historical Provider, MD  losartan (COZAAR) 100 MG tablet Take 100 mg by mouth daily. 03/19/16  Yes Historical Provider, MD  metFORMIN (GLUCOPHAGE) 1000 MG tablet Take 1,000 mg by mouth 2 (two) times daily. Reported on 01/20/2016   Yes Historical Provider, MD  ondansetron (ZOFRAN) 4 MG tablet Take 1 tablet (4 mg total) by mouth every 6 (six) hours. 03/26/16  Yes Dorie Rank, MD  ondansetron (ZOFRAN) 4 MG tablet Take 4 mg by mouth every 6 (six) hours as needed for nausea or vomiting.   Yes Historical Provider, MD  Red Yeast Rice Extract 600 MG CAPS Take 1 capsule by mouth daily.    Yes Historical Provider, MD  acetaminophen (TYLENOL) 325 MG tablet Take 325 mg by mouth every 6 (six) hours as needed for moderate pain or headache.     Historical Provider, MD  erythromycin ophthalmic ointment Apply small amount to left lower lid 4 times a day. Patient not taking: Reported on 03/26/2016 01/20/16   Darlyne Russian, MD  famotidine (PEPCID) 20 MG tablet Take 1 tablet (20 mg total) by mouth 2 (two) times daily. 03/26/16   Dorie Rank, MD   BP 154/79 mmHg  Pulse 103  Temp(Src) 98.6 F (37 C) (Oral)  Resp 16  SpO2 100%   Physical Exam  Constitutional: She is oriented to person, place, and time. She appears well-developed and well-nourished. No distress.  HENT:  Head: Normocephalic and atraumatic.  Eyes: Conjunctivae and EOM are normal.  Neck: Neck supple. No tracheal deviation present.  Cardiovascular: Normal rate and regular rhythm.   Pulmonary/Chest: Effort normal. No respiratory distress. She exhibits no tenderness.  Abdominal: Soft. Bowel sounds are normal. She exhibits no distension. There is no tenderness.  Musculoskeletal: Normal range of motion. She exhibits no edema or tenderness.  Neurological: She is alert and oriented to person, place, and time.  Skin: Skin is warm and  dry.  Psychiatric: She has a normal mood and affect. Her behavior is normal.  Nursing note and vitals reviewed.   ED Course  Procedures (including critical care time) DIAGNOSTIC STUDIES: Oxygen Saturation is 100% on RA, normal by my interpretation.    COORDINATION OF CARE: 8:51 PM-Discussed treatment plan which includes CBC panel, CMP, CBG with pt at bedside and pt agreed to plan.     Labs Review Labs Reviewed  COMPREHENSIVE METABOLIC PANEL - Abnormal; Notable for the following:    Sodium 130 (*)    Potassium 3.0 (*)    Chloride 97 (*)    AST 48 (*)    All other components within normal limits  CBC - Abnormal; Notable for the following:    WBC 10.8 (*)    MCHC 36.1 (*)    All other components within normal limits  URINALYSIS, ROUTINE W REFLEX MICROSCOPIC (NOT AT [facility]) - Abnormal; Notable for the following:    Glucose, UA 500 (*)    Leukocytes, UA TRACE (*)    All other components within normal limits  URINE MICROSCOPIC-ADD ON - Abnormal; Notable for the following:    Squamous Epithelial / LPF 0-5 (*)    Bacteria, UA RARE (*)    All other components within normal limits  CBG MONITORING, ED - Abnormal; Notable for the following:    Glucose-Capillary 53 (*)    All other components within normal limits  CBG MONITORING, ED - Abnormal; Notable for the following:    Glucose-Capillary 103 (*)    All other components within normal limits  I-STAT CHEM 8, ED - Abnormal; Notable for the following:    Sodium 130 (*)    Potassium 3.2 (*)    Chloride 94 (*)    BUN 23 (*)    All other components within normal limits  CBG MONITORING, ED - Abnormal; Notable for the following:    Glucose-Capillary 107 (*)    All other components within normal limits  CBG MONITORING, ED - Abnormal; Notable for the following:    Glucose-Capillary 133 (*)    All other components within normal limits  CBG MONITORING, ED    Imaging Review Dg Chest 2 View  03/29/2016  CLINICAL DATA:  Hypotension and  weakness for 1 week. History of diabetes and high blood pressure. EXAM: CHEST  2 VIEW COMPARISON:  Seventeen 17 FINDINGS: Normal heart size and pulmonary vascularity. No focal airspace disease or consolidation in the lungs. No blunting of costophrenic angles. No pneumothorax. Mediastinal contours appear intact. Degenerative changes in the spine and shoulders. Calcification of the aorta. IMPRESSION: No active cardiopulmonary disease. Electronically Signed   By: Lucienne Capers M.D.   On: 03/29/2016 06:15   Ct Head Wo Contrast  03/29/2016  CLINICAL DATA:  Hypoglycemic and hypotensive for about a week. Fell 6 days ago. Weakness since the fall. Confusion, double vision, and slurred speech for 1 day. Unsteady gait. EXAM: CT HEAD WITHOUT CONTRAST TECHNIQUE: Contiguous axial images were obtained from the base of the skull through the vertex without intravenous contrast. COMPARISON:  12/24/2015 FINDINGS: Mild cerebral atrophy. Ventricular dilatation consistent with central atrophy. Low-attenuation changes in the deep white matter consistent with small vessel ischemia. No mass effect or midline shift. No abnormal extra-axial fluid collections. Gray-white matter junctions are distinct. Basal cisterns are not effaced. No evidence of acute intracranial hemorrhage. No depressed skull fractures. Visualized paranasal sinuses and mastoid air cells are not opacified. IMPRESSION: No acute intracranial abnormalities. Chronic atrophy and small vessel ischemic changes. Electronically Signed   By: Lucienne Capers M.D.   On: 03/29/2016 04:26   I have personally reviewed and evaluated these lab results as part of my medical decision-making.   EKG Interpretation None    will again check labs, urine K+ low will supplement, will give carafate and reassess abdominal "burning"  Family states that she lives alone.  They feel very uncomfortable allowing her to return to her home in her present condition, needing assistance for  ambulation and very labile blood sugars. Patient was ambulated in the hall, but didn't need the assistance of 1 for balance MDM   Final diagnoses:  Hypoglycemia  Weakness  Unsteady gait  Hypokalemia  Epigastric burning sensation      I personally performed the services described in this documentation, which was scribed in my presence. The recorded information has been reviewed and is accurate.     Junius Creamer, NP 03/29/16 2012  Davonna Belling, MD 03/31/16 [phone]

## 2016-03-28 NOTE — ED Notes (Signed)
Bed: WA09 Expected date:  Expected time:  Means of arrival:  Comments: EMS hypoglycemia / altered LOC

## 2016-03-28 NOTE — ED Notes (Signed)
Pt BIB [facility] from home. Pt c/o of hypoglycemia. Pt CBG 42 upon EMS arrival. Pt has has glucagon and oral glucose. Last EMS CBG was 72. A&Ox4 in triage.

## 2016-03-28 NOTE — ED Notes (Signed)
Pt stated that she cannot urinate.  Pt's family member stated that the pt could not use the restroom before leaving home.  Will try again in 30 minutes.

## 2016-03-29 ENCOUNTER — Emergency Department (HOSPITAL_COMMUNITY): Payer: Medicare Other

## 2016-03-29 LAB — CBG MONITORING, ED
GLUCOSE-CAPILLARY: 133 mg/dL — AB (ref 65–99)
Glucose-Capillary: 90 mg/dL (ref 65–99)
Glucose-Capillary: 107 mg/dL — ABNORMAL HIGH (ref 65–99)

## 2016-03-29 MED ORDER — GI COCKTAIL ~~LOC~~
30.0000 mL | Freq: Once | ORAL | Status: AC
  Administered 2016-03-29: 30 mL via ORAL
  Filled 2016-03-29: qty 30

## 2016-03-29 NOTE — ED Notes (Signed)
Pt transported to CT ?

## 2016-03-29 NOTE — Discharge Instructions (Signed)
You have an appointment with your primary physician, Dr. [name] tomorrow morning at 11:15 - It is very important that you make it to this scheduled appointment.  Use walker at all times when ambulating around your home.  Return to ER for any new or worsening symptoms, any additional concerns.

## 2016-03-29 NOTE — ED Notes (Signed)
Pt ambulated 60 feet.  Pt maintained steady gait with assistance.   Pt indicated that she has been needing assistance walking since last week.

## 2016-03-29 NOTE — ED Notes (Signed)
Pt given ice water. Pt aware awaiting SW consult.

## 2016-03-29 NOTE — ED Notes (Signed)
Pt requesting to sit up. Pt is sitting on the chair watching TV. No distress. Call bell within reach.

## 2016-03-29 NOTE — ED Provider Notes (Signed)
  Physical Exam  BP 148/70 mmHg  Pulse 94  Temp(Src) 97.9 F (36.6 C) (Oral)  Resp 16  SpO2 100%  Physical Exam  ED Course  Procedures  MDM  Pt comes in with cc of weakness, n/v. Lives independently, but appears to be struggling to be independent.  [facility] to assess the patient this morning for possible admission. SW has been consulted.         Varney Biles, MD 03/29/16 [phone]

## 2016-03-29 NOTE — ED Notes (Signed)
Pt getting dressed at present time. Awaiting walker and discharge paperwork to be printed.

## 2016-03-29 NOTE — Progress Notes (Addendum)
ED CM consulted by ED unit secretary and ED SW about pt need for DME and home health services after a hypoglycemic episode and daughter finding pt down at home EDP confirmed she has made an appt with pt to f/u with pcp on 03/30/16  CM reviewed in details medicare guidelines, Choices of home health [facility]) (length of stay in home, types of [facility] staff available, coverage, primary caregiver, up to 24 hrs before services may be started) and choices of Private duty nursing (PDN-coverage, length of stay in the home types of staff available). CM reviewed availability of [place] SW to assist pcp to get pt to snf (if desired disposition) from the community level. CM provided pt/family with a list of [facility] home health agencies and choice connections Pt refused a list of PDN services ands stated she "do not want anything I have to pay for"  Daughter is primary care giver Pt also has neighbors that check on her  ED CM and daughter assisted pt to bedside commode in her ED room to void Pt noted to need 1+ assist to shuffle to bedside commode Pt wiped herself Pt initially requested only a cane for d/c Pt has not had any DME in past CM recommended a walker considering the time it took for her to get to bedside commode with 1+ assist Pt agreed to a rolling walker ht 5'2" wt 178 lbs  1113 ED Cm spoke  with [facility] to obtain a rolling walker for pt States Gilford Rile can  Be brought to pt in [facility] ED in fifteen minutes ht 5'2" wt 178 lbs  1125 After reviewing home health list pt and daughter chose [facility] as their choice of home health agency ED Cm spoke with Maximino Greenland [phone] about home health services referral for HHRN/PT/aide ED PA/NP/RN updated   Entered in d/c instructions  Care, [facility] Call on 03/29/2016 As needed - This agency has been called to provider your home halth nurse, physical therapist and home health aide If you have questions about services please call them as  needed [address] [place] [address] [phone] Nadine Call As needed [facility] DME department provides Equipment for [facility] home health care If you may need further DME your doctor or you may contact this agency for assistance [place]  [phone]

## 2016-03-29 NOTE — ED Provider Notes (Signed)
Care assumed from previous provider NP [name]. Please see note for further details. Briefly, patient is a 81 y.o. female who lives alone. Episode of hypoglycemia and found down this am by daughter. All labs and imaging reviewed which were very reassuring. Case discussed, plan agreed upon. Discussed case with hospitalist who do not believe patient will benefit from admission. Social work and Case management consulted. Per case management, patient is eligible for home health- orders placed for PT, RN, and aide. Will also get rolling walker for patient to aide with safe ambulation.   I placed a call to patient's PCP, Dr. [name] at [facility]. Clinic aware of patient's ED status and scheduled a follow up appointment for tomorrow at 11:15am. Discussed this with patient and daughter at the bedside. Patient would like to go home and feels safe doing so. Knowing that she has follow up with PCP tomorrow as well as walker, daughter states she feels much more comfortable with her mother going home. Encouraged patient to keep snack in pockets or close by in case she starts to feel weak as if her sugar is dropping. Spoke at length of using walker at all times while ambulating around the house. Patient again states that she wants to go home and feels safe doing so. Daughter also feels comfortable with plan as dictated above. Again informed patient of follow up appointment tomorrow morning and the importance of attending this appointment. Daughter at bedside states that she will make sure she gets to appt. Reasons to return to ED discussed and all questions answered.   Patient discussed with Dr. [name] who agrees with treatment plan.   [facility] Allecia Bells, PA-C 03/29/16 1210

## 2016-03-29 NOTE — ED Notes (Signed)
Pt/family have spoken with SW. SW speaking with CM regarding possible walker and PT/OT.

## 2016-03-29 NOTE — Progress Notes (Signed)
CSW spoke with patient at bedside with daughter, Reinaldo Meeker present. She stated she is patient's main support. Patient reports she lives at home alone. Patient reports her blood sugar level dropped to forty-two and she fell last week. Daughter states she found patient the next night after she fell when she got off of work. Daughter stated patient has been having problems with her stomach burning, throwing up, and not eating. Patient reports she completes her own ADL's and she reports she is normally independent, however, she states she has fallen more in the past year. Patient reports she has never had home health services and her primary care doctor is [facility], Dr. [name]. CSW consulted with ED CM regarding home health and medical equipment. No further questions noted.   Genice Rouge [id number] ED CSW 03/29/2016 4:18 PM

## 2016-04-15 ENCOUNTER — Emergency Department (HOSPITAL_COMMUNITY): Payer: Medicare Other

## 2016-04-15 ENCOUNTER — Encounter (HOSPITAL_COMMUNITY): Payer: Self-pay

## 2016-04-15 ENCOUNTER — Inpatient Hospital Stay (HOSPITAL_COMMUNITY)
Admission: EM | Admit: 2016-04-15 | Discharge: 2016-04-27 | DRG: 391 | Disposition: A | Discharge status: Skilled Nursing Facility | Payer: Medicare Other | Attending: Internal Medicine | Admitting: Internal Medicine

## 2016-04-15 DIAGNOSIS — K210 Gastro-esophageal reflux disease with esophagitis: Secondary | ICD-10-CM | POA: Diagnosis present

## 2016-04-15 DIAGNOSIS — Z789 Other specified health status: Secondary | ICD-10-CM | POA: Diagnosis not present

## 2016-04-15 DIAGNOSIS — Y92009 Unspecified place in unspecified non-institutional (private) residence as the place of occurrence of the external cause: Secondary | ICD-10-CM

## 2016-04-15 DIAGNOSIS — D490 Neoplasm of unspecified behavior of digestive system: Secondary | ICD-10-CM | POA: Diagnosis present

## 2016-04-15 DIAGNOSIS — K2289 Other specified disease of esophagus: Secondary | ICD-10-CM

## 2016-04-15 DIAGNOSIS — K317 Polyp of stomach and duodenum: Secondary | ICD-10-CM | POA: Diagnosis not present

## 2016-04-15 DIAGNOSIS — R1110 Vomiting, unspecified: Secondary | ICD-10-CM | POA: Diagnosis not present

## 2016-04-15 DIAGNOSIS — K209 Esophagitis, unspecified without bleeding: Secondary | ICD-10-CM

## 2016-04-15 DIAGNOSIS — R059 Cough, unspecified: Secondary | ICD-10-CM

## 2016-04-15 DIAGNOSIS — Z8249 Family history of ischemic heart disease and other diseases of the circulatory system: Secondary | ICD-10-CM

## 2016-04-15 DIAGNOSIS — Z794 Long term (current) use of insulin: Secondary | ICD-10-CM

## 2016-04-15 DIAGNOSIS — T827XXA Infection and inflammatory reaction due to other cardiac and vascular devices, implants and grafts, initial encounter: Secondary | ICD-10-CM | POA: Diagnosis not present

## 2016-04-15 DIAGNOSIS — E119 Type 2 diabetes mellitus without complications: Secondary | ICD-10-CM | POA: Diagnosis present

## 2016-04-15 DIAGNOSIS — Y712 Prosthetic and other implants, materials and accessory cardiovascular devices associated with adverse incidents: Secondary | ICD-10-CM | POA: Diagnosis not present

## 2016-04-15 DIAGNOSIS — K259 Gastric ulcer, unspecified as acute or chronic, without hemorrhage or perforation: Secondary | ICD-10-CM | POA: Diagnosis present

## 2016-04-15 DIAGNOSIS — E1169 Type 2 diabetes mellitus with other specified complication: Secondary | ICD-10-CM

## 2016-04-15 DIAGNOSIS — Z86010 Personal history of colonic polyps: Secondary | ICD-10-CM

## 2016-04-15 DIAGNOSIS — T80212A Local infection due to central venous catheter, initial encounter: Secondary | ICD-10-CM | POA: Diagnosis not present

## 2016-04-15 DIAGNOSIS — J9601 Acute respiratory failure with hypoxia: Secondary | ICD-10-CM | POA: Diagnosis not present

## 2016-04-15 DIAGNOSIS — E1165 Type 2 diabetes mellitus with hyperglycemia: Secondary | ICD-10-CM | POA: Diagnosis present

## 2016-04-15 DIAGNOSIS — J9811 Atelectasis: Secondary | ICD-10-CM | POA: Diagnosis present

## 2016-04-15 DIAGNOSIS — Z801 Family history of malignant neoplasm of trachea, bronchus and lung: Secondary | ICD-10-CM

## 2016-04-15 DIAGNOSIS — W19XXXA Unspecified fall, initial encounter: Secondary | ICD-10-CM

## 2016-04-15 DIAGNOSIS — K222 Esophageal obstruction: Secondary | ICD-10-CM

## 2016-04-15 DIAGNOSIS — E876 Hypokalemia: Secondary | ICD-10-CM | POA: Diagnosis present

## 2016-04-15 DIAGNOSIS — R531 Weakness: Secondary | ICD-10-CM | POA: Diagnosis present

## 2016-04-15 DIAGNOSIS — K208 Other esophagitis: Secondary | ICD-10-CM | POA: Diagnosis not present

## 2016-04-15 DIAGNOSIS — K921 Melena: Secondary | ICD-10-CM | POA: Diagnosis present

## 2016-04-15 DIAGNOSIS — I10 Essential (primary) hypertension: Secondary | ICD-10-CM | POA: Diagnosis present

## 2016-04-15 DIAGNOSIS — Z833 Family history of diabetes mellitus: Secondary | ICD-10-CM

## 2016-04-15 DIAGNOSIS — Z9049 Acquired absence of other specified parts of digestive tract: Secondary | ICD-10-CM

## 2016-04-15 DIAGNOSIS — E860 Dehydration: Secondary | ICD-10-CM | POA: Diagnosis present

## 2016-04-15 DIAGNOSIS — J189 Pneumonia, unspecified organism: Secondary | ICD-10-CM | POA: Diagnosis present

## 2016-04-15 DIAGNOSIS — G4730 Sleep apnea, unspecified: Secondary | ICD-10-CM | POA: Diagnosis present

## 2016-04-15 DIAGNOSIS — Z8711 Personal history of peptic ulcer disease: Secondary | ICD-10-CM | POA: Diagnosis not present

## 2016-04-15 DIAGNOSIS — T17908A Unspecified foreign body in respiratory tract, part unspecified causing other injury, initial encounter: Secondary | ICD-10-CM | POA: Diagnosis present

## 2016-04-15 DIAGNOSIS — R296 Repeated falls: Secondary | ICD-10-CM | POA: Diagnosis present

## 2016-04-15 DIAGNOSIS — E871 Hypo-osmolality and hyponatremia: Secondary | ICD-10-CM | POA: Diagnosis present

## 2016-04-15 DIAGNOSIS — K319 Disease of stomach and duodenum, unspecified: Secondary | ICD-10-CM | POA: Diagnosis not present

## 2016-04-15 DIAGNOSIS — W06XXXA Fall from bed, initial encounter: Secondary | ICD-10-CM | POA: Diagnosis present

## 2016-04-15 DIAGNOSIS — E785 Hyperlipidemia, unspecified: Secondary | ICD-10-CM | POA: Diagnosis present

## 2016-04-15 DIAGNOSIS — K449 Diaphragmatic hernia without obstruction or gangrene: Secondary | ICD-10-CM | POA: Diagnosis present

## 2016-04-15 DIAGNOSIS — B49 Unspecified mycosis: Secondary | ICD-10-CM | POA: Diagnosis present

## 2016-04-15 DIAGNOSIS — Z978 Presence of other specified devices: Secondary | ICD-10-CM | POA: Diagnosis not present

## 2016-04-15 DIAGNOSIS — K922 Gastrointestinal hemorrhage, unspecified: Secondary | ICD-10-CM

## 2016-04-15 DIAGNOSIS — A419 Sepsis, unspecified organism: Secondary | ICD-10-CM | POA: Diagnosis not present

## 2016-04-15 DIAGNOSIS — K219 Gastro-esophageal reflux disease without esophagitis: Secondary | ICD-10-CM | POA: Diagnosis present

## 2016-04-15 DIAGNOSIS — K920 Hematemesis: Secondary | ICD-10-CM | POA: Diagnosis not present

## 2016-04-15 DIAGNOSIS — K228 Other specified diseases of esophagus: Secondary | ICD-10-CM

## 2016-04-15 DIAGNOSIS — K311 Adult hypertrophic pyloric stenosis: Secondary | ICD-10-CM

## 2016-04-15 DIAGNOSIS — K269 Duodenal ulcer, unspecified as acute or chronic, without hemorrhage or perforation: Secondary | ICD-10-CM | POA: Diagnosis present

## 2016-04-15 DIAGNOSIS — Y92099 Unspecified place in other non-institutional residence as the place of occurrence of the external cause: Secondary | ICD-10-CM | POA: Diagnosis not present

## 2016-04-15 DIAGNOSIS — J690 Pneumonitis due to inhalation of food and vomit: Secondary | ICD-10-CM | POA: Diagnosis not present

## 2016-04-15 DIAGNOSIS — J90 Pleural effusion, not elsewhere classified: Secondary | ICD-10-CM | POA: Diagnosis present

## 2016-04-15 DIAGNOSIS — N390 Urinary tract infection, site not specified: Secondary | ICD-10-CM | POA: Diagnosis present

## 2016-04-15 DIAGNOSIS — D62 Acute posthemorrhagic anemia: Secondary | ICD-10-CM | POA: Diagnosis present

## 2016-04-15 DIAGNOSIS — Y718 Miscellaneous cardiovascular devices associated with adverse incidents, not elsewhere classified: Secondary | ICD-10-CM | POA: Diagnosis not present

## 2016-04-15 DIAGNOSIS — Z823 Family history of stroke: Secondary | ICD-10-CM

## 2016-04-15 DIAGNOSIS — T17998D Other foreign object in respiratory tract, part unspecified causing other injury, subsequent encounter: Secondary | ICD-10-CM | POA: Diagnosis not present

## 2016-04-15 DIAGNOSIS — Z01810 Encounter for preprocedural cardiovascular examination: Secondary | ICD-10-CM | POA: Diagnosis not present

## 2016-04-15 DIAGNOSIS — T17998A Other foreign object in respiratory tract, part unspecified causing other injury, initial encounter: Secondary | ICD-10-CM | POA: Diagnosis not present

## 2016-04-15 DIAGNOSIS — Z7982 Long term (current) use of aspirin: Secondary | ICD-10-CM

## 2016-04-15 DIAGNOSIS — R05 Cough: Secondary | ICD-10-CM

## 2016-04-15 LAB — URINALYSIS, ROUTINE W REFLEX MICROSCOPIC
HGB URINE DIPSTICK: NEGATIVE
Ketones, ur: 40 mg/dL — AB
Leukocytes, UA: NEGATIVE
Nitrite: NEGATIVE
PH: 6.5 (ref 5.0–8.0)
Protein, ur: NEGATIVE mg/dL
SPECIFIC GRAVITY, URINE: 1.033 — AB (ref 1.005–1.030)

## 2016-04-15 LAB — URINE MICROSCOPIC-ADD ON
RBC / HPF: NONE SEEN RBC/hpf (ref 0–5)
SQUAMOUS EPITHELIAL / LPF: NONE SEEN
WBC, UA: NONE SEEN WBC/hpf (ref 0–5)

## 2016-04-15 LAB — CBC WITH DIFFERENTIAL/PLATELET
Basophils Absolute: 0.1 10*3/uL (ref 0.0–0.1)
Basophils Relative: 0 %
EOS ABS: 0 10*3/uL (ref 0.0–0.7)
EOS PCT: 0 %
HCT: 36.9 % (ref 36.0–46.0)
Hemoglobin: 12.5 g/dL (ref 12.0–15.0)
LYMPHS ABS: 1.3 10*3/uL (ref 0.7–4.0)
LYMPHS PCT: 6 %
MCH: 29.1 pg (ref 26.0–34.0)
MCHC: 33.9 g/dL (ref 30.0–36.0)
MCV: 86 fL (ref 78.0–100.0)
MONO ABS: 1.2 10*3/uL — AB (ref 0.1–1.0)
Monocytes Relative: 5 %
Neutro Abs: 19 10*3/uL — ABNORMAL HIGH (ref 1.7–7.7)
Neutrophils Relative %: 89 %
PLATELETS: 455 10*3/uL — AB (ref 150–400)
RBC: 4.29 MIL/uL (ref 3.87–5.11)
RDW: 15.7 % — AB (ref 11.5–15.5)
WBC: 21.5 10*3/uL — ABNORMAL HIGH (ref 4.0–10.5)

## 2016-04-15 LAB — HEMOGLOBIN AND HEMATOCRIT, BLOOD
HEMATOCRIT: 30.6 % — AB (ref 36.0–46.0)
HEMATOCRIT: 27 % — AB (ref 36.0–46.0)
HEMOGLOBIN: 10.2 g/dL — AB (ref 12.0–15.0)
HEMOGLOBIN: 9.5 g/dL — AB (ref 12.0–15.0)

## 2016-04-15 LAB — COMPREHENSIVE METABOLIC PANEL
ALBUMIN: 3.5 g/dL (ref 3.5–5.0)
ALT: 19 U/L (ref 14–54)
AST: 21 U/L (ref 15–41)
Alkaline Phosphatase: 60 U/L (ref 38–126)
Anion gap: 14 (ref 5–15)
BILIRUBIN TOTAL: 1.3 mg/dL — AB (ref 0.3–1.2)
BUN: 35 mg/dL — AB (ref 6–20)
CHLORIDE: 89 mmol/L — AB (ref 101–111)
CO2: 26 mmol/L (ref 22–32)
CREATININE: 1.11 mg/dL — AB (ref 0.44–1.00)
Calcium: 11.9 mg/dL — ABNORMAL HIGH (ref 8.9–10.3)
GFR calc Af Amer: 54 mL/min — ABNORMAL LOW (ref >60)
GFR calc non Af Amer: 46 mL/min — ABNORMAL LOW (ref >60)
GLUCOSE: 433 mg/dL — AB (ref 65–99)
POTASSIUM: 3.3 mmol/L — AB (ref 3.5–5.1)
Sodium: 129 mmol/L — ABNORMAL LOW (ref 135–145)
Total Protein: 7 g/dL (ref 6.5–8.1)

## 2016-04-15 LAB — CK: Total CK: 43 U/L (ref 38–234)

## 2016-04-15 LAB — GLUCOSE, CAPILLARY
GLUCOSE-CAPILLARY: 392 mg/dL — AB (ref 65–99)
GLUCOSE-CAPILLARY: 334 mg/dL — AB (ref 65–99)

## 2016-04-15 LAB — TYPE AND SCREEN
ABO/RH(D): A POS
ANTIBODY SCREEN: NEGATIVE

## 2016-04-15 LAB — CBG MONITORING, ED
GLUCOSE-CAPILLARY: 446 mg/dL — AB (ref 65–99)
GLUCOSE-CAPILLARY: 427 mg/dL — AB (ref 65–99)
Glucose-Capillary: 422 mg/dL — ABNORMAL HIGH (ref 65–99)

## 2016-04-15 LAB — POC OCCULT BLOOD, ED: Fecal Occult Bld: POSITIVE — AB

## 2016-04-15 LAB — I-STAT TROPONIN, ED: Troponin i, poc: 0.01 ng/mL (ref 0.00–0.08)

## 2016-04-15 LAB — PROTIME-INR
INR: 1.13
PROTHROMBIN TIME: 14.5 s (ref 11.4–15.2)

## 2016-04-15 MED ORDER — INSULIN ASPART 100 UNIT/ML ~~LOC~~ SOLN: 14.0000 [IU] | Freq: Once | SUBCUTANEOUS | Status: DC

## 2016-04-15 MED ORDER — POTASSIUM CHLORIDE 20 MEQ/15ML (10%) PO SOLN
40.0000 meq | Freq: Once | ORAL | Status: AC
  Administered 2016-04-15: 40 meq via ORAL
  Filled 2016-04-15: qty 30

## 2016-04-15 MED ORDER — POTASSIUM CHLORIDE IN NACL 40-0.9 MEQ/L-% IV SOLN
INTRAVENOUS | Status: AC
  Administered 2016-04-15 – 2016-04-16 (×3): 100 mL/h via INTRAVENOUS
  Filled 2016-04-15 (×3): qty 1000

## 2016-04-15 MED ORDER — HYDRALAZINE HCL 20 MG/ML IJ SOLN: 10.0000 mg | Freq: Four times a day (QID) | INTRAMUSCULAR | Status: DC | PRN

## 2016-04-15 MED ORDER — SODIUM CHLORIDE 0.9 % IV BOLUS (SEPSIS)
1000.0000 mL | Freq: Once | INTRAVENOUS | Status: AC
  Administered 2016-04-15: 1000 mL via INTRAVENOUS

## 2016-04-15 MED ORDER — PANTOPRAZOLE SODIUM 40 MG IV SOLR
40.0000 mg | Freq: Two times a day (BID) | INTRAVENOUS | Status: DC
  Administered 2016-04-19 – 2016-04-27 (×18): 40 mg via INTRAVENOUS
  Filled 2016-04-15 (×18): qty 40

## 2016-04-15 MED ORDER — PANTOPRAZOLE SODIUM 40 MG IV SOLR
8.0000 mg/h | INTRAVENOUS | Status: AC
  Administered 2016-04-15 – 2016-04-18 (×5): 8 mg/h via INTRAVENOUS
  Filled 2016-04-15 (×14): qty 80

## 2016-04-15 MED ORDER — SUCRALFATE 1 G PO TABS: 1.0000 g | ORAL_TABLET | Freq: Four times a day (QID) | ORAL | Status: DC | PRN

## 2016-04-15 MED ORDER — SODIUM CHLORIDE 0.9% FLUSH
3.0000 mL | Freq: Two times a day (BID) | INTRAVENOUS | Status: DC
  Administered 2016-04-17 – 2016-04-27 (×7): 3 mL via INTRAVENOUS

## 2016-04-15 MED ORDER — INSULIN ASPART 100 UNIT/ML ~~LOC~~ SOLN
0.0000 [IU] | SUBCUTANEOUS | Status: DC
  Administered 2016-04-15: 15 [IU] via SUBCUTANEOUS
  Administered 2016-04-15: 11 [IU] via SUBCUTANEOUS
  Administered 2016-04-16: 5 [IU] via SUBCUTANEOUS

## 2016-04-15 MED ORDER — INSULIN ASPART 100 UNIT/ML ~~LOC~~ SOLN: 0.0000 [IU] | Freq: Four times a day (QID) | SUBCUTANEOUS | Status: DC

## 2016-04-15 MED ORDER — ERYTHROMYCIN 5 MG/GM OP OINT
TOPICAL_OINTMENT | Freq: Every day | OPHTHALMIC | Status: DC
  Administered 2016-04-15 – 2016-04-17 (×2): via OPHTHALMIC
  Administered 2016-04-17: 1 via OPHTHALMIC
  Administered 2016-04-18: 21:00:00 via OPHTHALMIC
  Administered 2016-04-19: 1 via OPHTHALMIC
  Administered 2016-04-22 – 2016-04-26 (×4): via OPHTHALMIC
  Filled 2016-04-15 (×2): qty 3.5

## 2016-04-15 MED ORDER — FAMOTIDINE IN NACL 20-0.9 MG/50ML-% IV SOLN: 20.0000 mg | Freq: Two times a day (BID) | INTRAVENOUS | Status: DC

## 2016-04-15 MED ORDER — ONDANSETRON HCL 4 MG PO TABS: 4.0000 mg | ORAL_TABLET | Freq: Four times a day (QID) | ORAL | Status: DC | PRN

## 2016-04-15 MED ORDER — HYDROCODONE-ACETAMINOPHEN 5-325 MG PO TABS
1.0000 | ORAL_TABLET | ORAL | Status: DC | PRN
Start: 2016-04-15 — End: 2016-04-17

## 2016-04-15 MED ORDER — SODIUM CHLORIDE 0.9 % IV SOLN
80.0000 mg | Freq: Once | INTRAVENOUS | Status: AC
  Administered 2016-04-15: 80 mg via INTRAVENOUS
  Filled 2016-04-15: qty 80

## 2016-04-15 MED ORDER — BOOST / RESOURCE BREEZE PO LIQD
1.0000 | Freq: Three times a day (TID) | ORAL | Status: DC
  Administered 2016-04-16: 1 via ORAL

## 2016-04-15 MED ORDER — ONDANSETRON HCL 4 MG/2ML IJ SOLN
4.0000 mg | Freq: Four times a day (QID) | INTRAMUSCULAR | Status: DC | PRN
Start: 2016-04-15 — End: 2016-04-27
  Administered 2016-04-20 – 2016-04-21 (×2): 4 mg via INTRAVENOUS
  Filled 2016-04-15 (×2): qty 2

## 2016-04-15 MED ORDER — SODIUM CHLORIDE 0.9 % IV BOLUS (SEPSIS)
500.0000 mL | Freq: Once | INTRAVENOUS | Status: AC
  Administered 2016-04-15: 500 mL via INTRAVENOUS

## 2016-04-15 NOTE — ED Notes (Signed)
PT TRANSPORTED TO 1403-1. AAOX4. PT IN NO APPARENT DISTRESS OR PAIN. THE OPPORTUNITY TO ASK QUESTIONS WAS PROVIDED.

## 2016-04-15 NOTE — ED Provider Notes (Signed)
[facility] DEPT Provider Note   CSN: [id number] Arrival date & time: 04/15/16  1120  First Provider Contact:  First MD Initiated Contact with Patient 04/15/16 1251      History is obtained from patient, patient's daughter and granddaughter who are accompanying her  History   Chief Complaint Chief Complaint  Patient presents with  . Fall  . Altered Mental Status    PER FAMILY    HPI [name] [name] is a 81 y.o. female.Patient reported he fell out of bed sometime in the middle the night she thinks around 4 AM. Her daughter found her on the floor next to bed at 9 AM today. Her daughter reports that she was hallucinating, which she often does when her blood sugars high. Her mental status seems improved now to her daughter and granddaughter. Patient also complains of abdominal "burning" for the past month. Also feels thirsty. Daughter reports that her blood sugar was 438 in the field. No treatment prior to coming here.  HPI  Past Medical History:  Diagnosis Date  . DDD (degenerative disc disease), lumbar   . Diabetes mellitus   . Diverticulosis   . Endocervical polyp   . Endometrial polyp   . GERD (gastroesophageal reflux disease)   . Hiatal hernia   . Hx of adenomatous colonic polyps   . Hyperlipidemia   . Hypertension   . Hyponatremia     Patient Active Problem List   Diagnosis Date Noted  . Intramural leiomyoma of uterus 08/25/2015  . Urinary incontinence 06/02/2013  . Nocturia 06/02/2013  . Rectocele 06/02/2013  . Vaginal atrophy 06/02/2013  . Sebaceous cyst of labia 05/01/2013  . Diabetes mellitus   . Reflux   . Sleep apnea     Past Surgical History:  Procedure Laterality Date  . CHOLECYSTECTOMY    . DILATION AND CURETTAGE OF UTERUS    . HYSTEROSCOPY    . TONSILLECTOMY      OB History    Gravida Para Term Preterm AB Living   1 1 1     1    SAB TAB Ectopic Multiple Live Births                   Home Medications    Prior to Admission  medications   Medication Sig Start Date End Date Taking? Authorizing Provider  metFORMIN (GLUCOPHAGE) 1000 MG tablet Take 1,000 mg by mouth 2 (two) times daily. Reported on 01/20/2016   Yes Historical Provider, MD  acetaminophen (TYLENOL) 325 MG tablet Take 325 mg by mouth every 6 (six) hours as needed for moderate pain or headache.     Historical Provider, MD  aspirin 81 MG tablet Take 162 mg by mouth at bedtime.     Historical Provider, MD  aspirin-sod bicarb-citric acid (ALKA-SELTZER) 325 MG TBEF tablet Take 325 mg by mouth every 6 (six) hours as needed (acid reflux).    Historical Provider, MD  Cholecalciferol (VITAMIN D) 2000 units tablet Take 2,000 Units by mouth daily.    Historical Provider, MD  CINNAMON PO Take 1,000 mg by mouth daily.    Historical Provider, MD  erythromycin ophthalmic ointment Apply small amount to left lower lid 4 times a day. Patient not taking: Reported on 03/26/2016 01/20/16   Darlyne Russian, MD  famotidine (PEPCID) 20 MG tablet Take 1 tablet (20 mg total) by mouth 2 (two) times daily. Patient not taking: Reported on 04/15/2016 03/26/16   Dorie Rank, MD  glipiZIDE (GLUCOTROL XL) 10 MG  24 hr tablet Take 10 mg by mouth 2 (two) times daily. 03/14/16   Historical Provider, MD  losartan (COZAAR) 100 MG tablet Take 100 mg by mouth daily. 03/19/16   Historical Provider, MD  ondansetron (ZOFRAN) 4 MG tablet Take 1 tablet (4 mg total) by mouth every 6 (six) hours. Patient not taking: Reported on 04/15/2016 03/26/16   Dorie Rank, MD  ondansetron (ZOFRAN) 4 MG tablet Take 4 mg by mouth every 6 (six) hours as needed for nausea or vomiting.    Historical Provider, MD    Family History Family History  Problem Relation Age of Onset  . Hypertension Mother   . Stroke Mother   . Diabetes Sister     x 2  . Lung cancer Brother     mets  . Alcohol abuse Father   . Breast cancer Cousin     Mat. 1st cousin-Age 110    Social History Social History  Substance Use Topics  . Smoking status:  Never Smoker  . Smokeless tobacco: Never Used  . Alcohol use No     Allergies   Ace inhibitors; Prednisone; and Sitagliptin   Review of Systems Review of Systems  Constitutional: Negative.   HENT: Negative.   Respiratory: Negative.   Cardiovascular: Negative.   Gastrointestinal: Positive for abdominal pain.       Burning in her abdomen  Musculoskeletal: Positive for gait problem.       Walks with walker  Skin: Negative.   Allergic/Immunologic: Positive for immunocompromised state.       Diabetic  Psychiatric/Behavioral: Positive for hallucinations.  All other systems reviewed and are negative.    Physical Exam Updated Vital Signs BP 108/67   Pulse 108   Temp 97.9 F (36.6 C) (Rectal)   Resp 26   Ht 5\' 2"  (1.575 m)   Wt 157 lb (71.2 kg)   SpO2 97%   BMI 28.72 kg/m   Physical Exam  Constitutional:  Chronically ill-appearing  HENT:  Head: Normocephalic and atraumatic.  Mucous membranes pale and dry  Eyes: Conjunctivae are normal. Pupils are equal, round, and reactive to light.  Neck: Neck supple. No tracheal deviation present. No thyromegaly present.  Cardiovascular: Normal rate and regular rhythm.   No murmur heard. Pulmonary/Chest: Effort normal and breath sounds normal.  Abdominal: Soft. Bowel sounds are normal. She exhibits no distension. There is no tenderness.  Obese  Genitourinary: Rectal exam shows guaiac positive stool.  Genitourinary Comments: Rectal normal tone Black stool Hemoccult positive  Musculoskeletal: Normal range of motion. She exhibits no edema or tenderness.  Tire spine nontender. Pelvis stable nontender. All 4 extremities without contusion abrasion or tenderness neurovascularly intact  Neurological: She is alert. A cranial nerve deficit is present. Coordination normal.  Moves all extremities follow simple commands  Skin: Skin is warm and dry. No rash noted.  Psychiatric: She has a normal mood and affect.  Nursing note and vitals  reviewed.    ED Treatments / Results  Labs (all labs ordered are listed, but only abnormal results are displayed) Labs Reviewed  CBG MONITORING, ED - Abnormal; Notable for the following:       Result Value   Glucose-Capillary 446 (*)    All other components within normal limits  POC OCCULT BLOOD, ED - Abnormal; Notable for the following:    Fecal Occult Bld POSITIVE (*)    All other components within normal limits  COMPREHENSIVE METABOLIC PANEL  CBC WITH DIFFERENTIAL/PLATELET  CK  URINALYSIS, ROUTINE  W REFLEX MICROSCOPIC (NOT AT [facility])  I-STAT TROPOININ, ED  TYPE AND SCREEN    EKG  EKG Interpretation  Date/Time:  Sunday April 15 2016 11:44:04 EDT Ventricular Rate:  110 PR Interval:    QRS Duration: 81 QT Interval:  309 QTC Calculation: 418 R Axis:   60 Text Interpretation:  Sinus tachycardia Anterior infarct, old SINCE LAST TRACING HEART RATE HAS INCREASED Confirmed by Winfred Leeds  MD, Yara Tomkinson [id number]) on 04/15/2016 1:40:46 PM       Radiology No results found.  Procedures Procedures (including critical care time)  Medications Ordered in ED Medications - No data to display   Initial Impression / Assessment and Plan / ED Course  I have reviewed the triage vital signs and the nursing notes.  Pertinent labs & imaging results that were available during my care of the patient were reviewed by me and considered in my medical decision making (see chart for details).  Clinical Course  Comment By Time  Patient feels improved and is alert and awake after treatment with intravenous fluids, and given water to drink. Orlie Dakin, MD 07/30 1531  Dr. Candiss Norse from hospital service consulted and will see patient in hospital. I've also consult to Dr.Pyrtle from gastroenterology service who will see patient in hospital. Orlie Dakin, MD 07/30 1531    She'll be admitted for glycemic control and evaluation of GI bleed Chest x-ray viewed by me Results for orders placed or performed  during the hospital encounter of 04/15/16  Comprehensive metabolic panel  Result Value Ref Range   Sodium 129 (L) 135 - 145 mmol/L   Potassium 3.3 (L) 3.5 - 5.1 mmol/L   Chloride 89 (L) 101 - 111 mmol/L   CO2 26 22 - 32 mmol/L   Glucose, Bld 433 (H) 65 - 99 mg/dL   BUN 35 (H) 6 - 20 mg/dL   Creatinine, Ser 1.11 (H) 0.44 - 1.00 mg/dL   Calcium 11.9 (H) 8.9 - 10.3 mg/dL   Total Protein 7.0 6.5 - 8.1 g/dL   Albumin 3.5 3.5 - 5.0 g/dL   AST 21 15 - 41 U/L   ALT 19 14 - 54 U/L   Alkaline Phosphatase 60 38 - 126 U/L   Total Bilirubin 1.3 (H) 0.3 - 1.2 mg/dL   GFR calc non Af Amer 46 (L) >60 mL/min   GFR calc Af Amer 54 (L) >60 mL/min   Anion gap 14 5 - 15  CBC with Differential  Result Value Ref Range   WBC 21.5 (H) 4.0 - 10.5 K/uL   RBC 4.29 3.87 - 5.11 MIL/uL   Hemoglobin 12.5 12.0 - 15.0 g/dL   HCT 36.9 36.0 - 46.0 %   MCV 86.0 78.0 - 100.0 fL   MCH 29.1 26.0 - 34.0 pg   MCHC 33.9 30.0 - 36.0 g/dL   RDW 15.7 (H) 11.5 - 15.5 %   Platelets 455 (H) 150 - 400 K/uL   Neutrophils Relative % 89 %   Neutro Abs 19.0 (H) 1.7 - 7.7 K/uL   Lymphocytes Relative 6 %   Lymphs Abs 1.3 0.7 - 4.0 K/uL   Monocytes Relative 5 %   Monocytes Absolute 1.2 (H) 0.1 - 1.0 K/uL   Eosinophils Relative 0 %   Eosinophils Absolute 0.0 0.0 - 0.7 K/uL   Basophils Relative 0 %   Basophils Absolute 0.1 0.0 - 0.1 K/uL  CK  Result Value Ref Range   Total CK 43 38 - 234 U/L  Urinalysis, Routine  w reflex microscopic (not at [facility])  Result Value Ref Range   Color, Urine YELLOW YELLOW   APPearance CLOUDY (A) CLEAR   Specific Gravity, Urine 1.033 (H) 1.005 - 1.030   pH 6.5 5.0 - 8.0   Glucose, UA >1000 (A) NEGATIVE mg/dL   Hgb urine dipstick NEGATIVE NEGATIVE   Bilirubin Urine SMALL (A) NEGATIVE   Ketones, ur 40 (A) NEGATIVE mg/dL   Protein, ur NEGATIVE NEGATIVE mg/dL   Nitrite NEGATIVE NEGATIVE   Leukocytes, UA NEGATIVE NEGATIVE  Urine microscopic-add on  Result Value Ref Range   Squamous Epithelial /  LPF NONE SEEN NONE SEEN   WBC, UA NONE SEEN 0 - 5 WBC/hpf   RBC / HPF NONE SEEN 0 - 5 RBC/hpf   Bacteria, UA MANY (A) NONE SEEN   Casts HYALINE CASTS (A) NEGATIVE  CBG monitoring, ED  Result Value Ref Range   Glucose-Capillary 446 (H) 65 - 99 mg/dL  POC occult blood, ED  Result Value Ref Range   Fecal Occult Bld POSITIVE (A) NEGATIVE  I-stat troponin, ED  Result Value Ref Range   Troponin i, poc 0.01 0.00 - 0.08 ng/mL   Comment 3          CBG monitoring, ED  Result Value Ref Range   Glucose-Capillary 422 (H) 65 - 99 mg/dL  Type and screen  Result Value Ref Range   ABO/RH(D) A POS    Antibody Screen PENDING    Sample Expiration 04/18/2016    Dg Chest 2 View  Result Date: 03/29/2016 CLINICAL DATA:  Hypotension and weakness for 1 week. History of diabetes and high blood pressure. EXAM: CHEST  2 VIEW COMPARISON:  Seventeen 17 FINDINGS: Normal heart size and pulmonary vascularity. No focal airspace disease or consolidation in the lungs. No blunting of costophrenic angles. No pneumothorax. Mediastinal contours appear intact. Degenerative changes in the spine and shoulders. Calcification of the aorta. IMPRESSION: No active cardiopulmonary disease. Electronically Signed   By: Lucienne Capers M.D.   On: 03/29/2016 06:15   Ct Head Wo Contrast  Result Date: 04/15/2016 CLINICAL DATA:  Unwitnessed fall. Fell wall trying again out of bed. Trauma to head without loss of consciousness. Initial encounter. EXAM: CT HEAD WITHOUT CONTRAST CT CERVICAL SPINE WITHOUT CONTRAST TECHNIQUE: Multidetector CT imaging of the head and cervical spine was performed following the standard protocol without intravenous contrast. Multiplanar CT image reconstructions of the cervical spine were also generated. COMPARISON:  CT head without contrast 03/29/2016. CT of the head and face 12/24/2015. FINDINGS: CT HEAD FINDINGS Moderate atrophy and periventricular white matter hypoattenuation is stable. This is slightly advanced  for age. The ventricles proportionate to the degree of atrophy. A remote infarct of the right cerebellum is stable. No acute infarct, hemorrhage, or mass lesion is present. No significant extraaxial fluid collection is present. Minimal soft tissue swelling is present in the left occipital sub scalp without an underlying fracture. The calvarium is intact. A remote right inferior orbital floor fracture has healed. The paranasal sinuses and mastoid air cells are clear. CT CERVICAL SPINE FINDINGS The cervical spine is imaged from the skullbase through T2-3. Asymmetric degenerative changes are present at the left atlantooccipital joint. Degenerative changes are present at C1-2. There is chronic loss of disc height from C3 through C7. Vertebral body heights and alignment are maintained. Facet hypertrophy is worse on the right. Soft tissues the neck demonstrate thyroid atrophy. Atherosclerotic calcifications are present at the carotid bifurcations bilaterally without significant stenoses. A large  right pleural effusion is present. The lung apices are otherwise clear. IMPRESSION: 1. Stable moderate atrophy and white matter disease. No acute intracranial abnormality. 2. Minimal soft tissue swelling in the left occipital scalp may be related to the acute trauma. There is no underlying fracture. The 3. Remote right inferior orbital floor fracture has healed. 4. Multilevel degenerative changes in the cervical spine. 5. New moderate-sized right pleural effusion. Recommend two-view chest x-ray for further evaluation. Electronically Signed   By: San Morelle M.D.   On: 04/15/2016 14:40  Ct Head Wo Contrast  Result Date: 03/29/2016 CLINICAL DATA:  Hypoglycemic and hypotensive for about a week. Fell 6 days ago. Weakness since the fall. Confusion, double vision, and slurred speech for 1 day. Unsteady gait. EXAM: CT HEAD WITHOUT CONTRAST TECHNIQUE: Contiguous axial images were obtained from the base of the skull through  the vertex without intravenous contrast. COMPARISON:  12/24/2015 FINDINGS: Mild cerebral atrophy. Ventricular dilatation consistent with central atrophy. Low-attenuation changes in the deep white matter consistent with small vessel ischemia. No mass effect or midline shift. No abnormal extra-axial fluid collections. Gray-white matter junctions are distinct. Basal cisterns are not effaced. No evidence of acute intracranial hemorrhage. No depressed skull fractures. Visualized paranasal sinuses and mastoid air cells are not opacified. IMPRESSION: No acute intracranial abnormalities. Chronic atrophy and small vessel ischemic changes. Electronically Signed   By: Lucienne Capers M.D.   On: 03/29/2016 04:26   Ct Cervical Spine Wo Contrast  Result Date: 04/15/2016 CLINICAL DATA:  Unwitnessed fall. Fell wall trying again out of bed. Trauma to head without loss of consciousness. Initial encounter. EXAM: CT HEAD WITHOUT CONTRAST CT CERVICAL SPINE WITHOUT CONTRAST TECHNIQUE: Multidetector CT imaging of the head and cervical spine was performed following the standard protocol without intravenous contrast. Multiplanar CT image reconstructions of the cervical spine were also generated. COMPARISON:  CT head without contrast 03/29/2016. CT of the head and face 12/24/2015. FINDINGS: CT HEAD FINDINGS Moderate atrophy and periventricular white matter hypoattenuation is stable. This is slightly advanced for age. The ventricles proportionate to the degree of atrophy. A remote infarct of the right cerebellum is stable. No acute infarct, hemorrhage, or mass lesion is present. No significant extraaxial fluid collection is present. Minimal soft tissue swelling is present in the left occipital sub scalp without an underlying fracture. The calvarium is intact. A remote right inferior orbital floor fracture has healed. The paranasal sinuses and mastoid air cells are clear. CT CERVICAL SPINE FINDINGS The cervical spine is imaged from the  skullbase through T2-3. Asymmetric degenerative changes are present at the left atlantooccipital joint. Degenerative changes are present at C1-2. There is chronic loss of disc height from C3 through C7. Vertebral body heights and alignment are maintained. Facet hypertrophy is worse on the right. Soft tissues the neck demonstrate thyroid atrophy. Atherosclerotic calcifications are present at the carotid bifurcations bilaterally without significant stenoses. A large right pleural effusion is present. The lung apices are otherwise clear. IMPRESSION: 1. Stable moderate atrophy and white matter disease. No acute intracranial abnormality. 2. Minimal soft tissue swelling in the left occipital scalp may be related to the acute trauma. There is no underlying fracture. The 3. Remote right inferior orbital floor fracture has healed. 4. Multilevel degenerative changes in the cervical spine. 5. New moderate-sized right pleural effusion. Recommend two-view chest x-ray for further evaluation. Electronically Signed   By: San Morelle M.D.   On: 04/15/2016 14:40  Dg Chest Port 1 View  Result Date: 04/15/2016 CLINICAL  DATA:  Unwitnessed fall today.  Pleural effusion. EXAM: PORTABLE CHEST 1 VIEW COMPARISON:  03/29/2016 FINDINGS: Heart is borderline in size. Mild vascular congestion. Bibasilar atelectasis. No visible effusions or pneumothorax. No acute bony abnormality. IMPRESSION: Borderline heart size with vascular congestion. Bibasilar atelectasis. Electronically Signed   By: Rolm Baptise M.D.   On: 04/15/2016 15:19  Dg Chest Portable 1 View  Result Date: 03/26/2016 CLINICAL DATA:  Weakness, hypotension EXAM: PORTABLE CHEST 1 VIEW COMPARISON:  06/03/2008 FINDINGS: Cardiomediastinal silhouette is stable. No acute infiltrate or pleural effusion. No pulmonary edema. Mild degenerative changes thoracic spine. Degenerative changes bilateral shoulders. IMPRESSION: No active disease. Electronically Signed   By: Lahoma Crocker  M.D.   On: 03/26/2016 15:22   Final Clinical Impressions(s) / ED Diagnoses  Patient will be to admit to telemetry Diagnosis #1 acute upper GI bleed #2 hyperglycemia #3 fall #4 hypokalemia Final diagnoses:  None    New Prescriptions New Prescriptions   No medications on file     Orlie Dakin, MD 04/15/16 1534

## 2016-04-15 NOTE — ED Notes (Signed)
IV ATTEMPT X2 UNSUCCESSFUL.

## 2016-04-15 NOTE — ED Notes (Signed)
Bed: WA09 Expected date:  Expected time:  Means of arrival:  Comments: 81 yo multiple complaints

## 2016-04-15 NOTE — ED Triage Notes (Signed)
PT RECEIVED FROM HOME VIA EMS FOR AN UNWITNESSED FALL. PT STATES SHE SLIPPED OUT OF BED WHILE TRYING TO GET UP. PT STATES SHE HIT HER HEAD BUT DID NOT PASS OUT, AND HAS NO PAIN AT THE MOMENT. PER THE FAMILY, THE PT SEEMS ALTERED,A ND IS NORMALLY INTACT. C-COLLAR ON PTA.

## 2016-04-15 NOTE — H&P (Addendum)
TRH H&P   Patient Demographics:    [name] [name], is a 81 y.o. female  MRN: [id number]   DOB - 09/07/1935  Admit Date - 04/15/2016  Outpatient Primary MD for the patient is [name], MD  Outpatient Specialists: [facility]  Patient coming from: Home, walks with a walker  Chief Complaint  Patient presents with  . Fall  . Altered Mental Status    PER FAMILY      HPI:    [name] [name]  is a 81 y.o. female, With history of essential hypertension, diabetes mellitus type 2, dyslipidemia, peptic ulcer disease, poor balance with multiple falls recently started using a walker still lives at home whose been experiencing some dark stools for the last few days along with generalized weakness. According to the patient she fell off of her bed last night after trying to pull herself in a different direction while she was in bed, she did not hurt herself but was unable to get herself up, she was on the floor for several hours, this morning when her daughter called to check on her and did not get a response she went home and found her on the floor. She was then brought to the ER.  In the ER patient was found to have heme positive stool, she appeared slightly weak, dehydrated and pale, I was called to admit her for dark stools which were heme positive with generalized weakness and multiple falls. Patient currently is symptom free except for generalized weakness and dark colored stools for the last few days. She also says that recently she has been having a lot of heartburn and epigastric abdominal pain for which she was started on Pepcid and Carafate without much relief, she has had history of colonic polyps in the past but no  history of peptic ulcer disease. Other than above all other review of systems are negative.    Review of systems:    In addition to the HPI above,   No Fever-chills, No Headache, No changes with Vision or hearing, No problems swallowing food or Liquids, No Chest pain, Cough or Shortness of Breath, No Abdominal pain, No Nausea or Vommitting, Bowel movements are regular, No Blood in stool or Urine,But she has noticed her stools have been dark, No dysuria, No new skin rashes or bruises, No new joints  pains-aches,  No new weakness, tingling, numbness in any extremity, generalized weakness is positive No recent weight gain or loss, No polyuria, polydypsia or polyphagia, No significant Mental Stressors.  A full 10 point Review of Systems was done, except as stated above, all other Review of Systems were negative.   With Past History of the following :    Past Medical History:  Diagnosis Date  . DDD (degenerative disc disease), lumbar   . Diabetes mellitus   . Diverticulosis   . Endocervical polyp   . Endometrial polyp   . GERD (gastroesophageal reflux disease)   . Hiatal hernia   . Hx of adenomatous colonic polyps   . Hyperlipidemia   . Hypertension   . Hyponatremia       Past Surgical History:  Procedure Laterality Date  . CHOLECYSTECTOMY    . DILATION AND CURETTAGE OF UTERUS    . HYSTEROSCOPY    . TONSILLECTOMY        Social History:     Social History  Substance Use Topics  . Smoking status: Never Smoker  . Smokeless tobacco: Never Used  . Alcohol use No         Family History :     Family History  Problem Relation Age of Onset  . Hypertension Mother   . Stroke Mother   . Diabetes Sister     x 2  . Lung cancer Brother     mets  . Alcohol abuse Father   . Breast cancer Cousin     Mat. 1st cousin-Age 88       Home Medications:   Prior to Admission medications   Medication Sig Start Date End Date Taking? Authorizing Provider  acetaminophen  (TYLENOL) 325 MG tablet Take 325 mg by mouth every 6 (six) hours as needed for moderate pain or headache.    Yes Historical Provider, MD  aspirin 81 MG tablet Take 162 mg by mouth at bedtime.    Yes Historical Provider, MD  aspirin-sod bicarb-citric acid (ALKA-SELTZER) 325 MG TBEF tablet Take 325 mg by mouth every 6 (six) hours as needed (acid reflux).   Yes Historical Provider, MD  glipiZIDE (GLUCOTROL XL) 10 MG 24 hr tablet Take 10 mg by mouth 2 (two) times daily. 03/14/16  Yes Historical Provider, MD  losartan (COZAAR) 100 MG tablet Take 100 mg by mouth daily. 03/19/16  Yes Historical Provider, MD  metFORMIN (GLUCOPHAGE) 1000 MG tablet Take 1,000 mg by mouth 2 (two) times daily. Reported on 01/20/2016   Yes Historical Provider, MD  ondansetron (ZOFRAN) 4 MG tablet Take 4 mg by mouth every 6 (six) hours as needed for nausea or vomiting.   Yes Historical Provider, MD  sucralfate (CARAFATE) 1 g tablet Take 1 g by mouth 4 (four) times daily as needed (pain).   Yes Historical Provider, MD  erythromycin ophthalmic ointment Apply small amount to left lower lid 4 times a day. Patient not taking: Reported on 03/26/2016 01/20/16   Darlyne Russian, MD  famotidine (PEPCID) 20 MG tablet Take 1 tablet (20 mg total) by mouth 2 (two) times daily. Patient not taking: Reported on 04/15/2016 03/26/16   Dorie Rank, MD  ondansetron (ZOFRAN) 4 MG tablet Take 1 tablet (4 mg total) by mouth every 6 (six) hours. Patient not taking: Reported on 04/15/2016 03/26/16   Dorie Rank, MD     Allergies:     Allergies  Allergen Reactions  . Ace Inhibitors Cough  . Prednisone  REACTION: lethargic  . Sitagliptin     Other reaction(s): Unknown     Physical Exam:   Vitals  Blood pressure 101/65, pulse 115, temperature 97.9 F (36.6 C), temperature source Oral, resp. rate 22, height 5\' 2"  (1.575 m), weight 71.2 kg (157 lb), SpO2 97 %.   1. General White elderly female lying in bed in NAD,    2. Normal affect and insight, Not  Suicidal or Homicidal, Awake Alert, Oriented X 3.  3. No F.N deficits, ALL C.Nerves Intact, Strength 5/5 all 4 extremities, Sensation intact all 4 extremities, Plantars down going.  4. Ears and Eyes appear Normal, Conjunctivae clear, PERRLA. Moist Oral Mucosa.  5. Supple Neck, No JVD, No cervical lymphadenopathy appriciated, No Carotid Bruits.  6. Symmetrical Chest wall movement, Good air movement bilaterally, CTAB.  7. RRR, No Gallops, Rubs or Murmurs, No Parasternal Heave.  8. Positive Bowel Sounds, Abdomen Soft, No tenderness, No organomegaly appriciated,No rebound -guarding or rigidity.  9.  No Cyanosis, Normal Skin Turgor, No Skin Rash or Bruise.  10. Good muscle tone,  joints appear normal , no effusions, Normal ROM.  11. No Palpable Lymph Nodes in Neck or Axillae      Data Review:    CBC  Recent Labs Lab 04/15/16 1315  WBC 21.5*  HGB 12.5  HCT 36.9  PLT 455*  MCV 86.0  MCH 29.1  MCHC 33.9  RDW 15.7*  LYMPHSABS 1.3  MONOABS 1.2*  EOSABS 0.0  BASOSABS 0.1   ------------------------------------------------------------------------------------------------------------------  Chemistries   Recent Labs Lab 04/15/16 1315  NA 129*  K 3.3*  CL 89*  CO2 26  GLUCOSE 433*  BUN 35*  CREATININE 1.11*  CALCIUM 11.9*  AST 21  ALT 19  ALKPHOS 60  BILITOT 1.3*   ------------------------------------------------------------------------------------------------------------------ estimated creatinine clearance is 38.6 mL/min (by C-G formula based on SCr of 1.11 mg/dL). ------------------------------------------------------------------------------------------------------------------ No results for input(s): TSH, T4TOTAL, T3FREE, THYROIDAB in the last 72 hours.  Invalid input(s): FREET3  Coagulation profile No results for input(s): INR, PROTIME in the last 168  hours. ------------------------------------------------------------------------------------------------------------------- No results for input(s): DDIMER in the last 72 hours. -------------------------------------------------------------------------------------------------------------------  Cardiac Enzymes No results for input(s): CKMB, TROPONINI, MYOGLOBIN in the last 168 hours.  Invalid input(s): CK ------------------------------------------------------------------------------------------------------------------ No results found for: BNP   ---------------------------------------------------------------------------------------------------------------  Urinalysis    Component Value Date/Time   COLORURINE YELLOW 04/15/2016 1320   APPEARANCEUR CLOUDY (A) 04/15/2016 1320   LABSPEC 1.033 (H) 04/15/2016 1320   PHURINE 6.5 04/15/2016 1320   GLUCOSEU >1000 (A) 04/15/2016 1320   HGBUR NEGATIVE 04/15/2016 1320   BILIRUBINUR SMALL (A) 04/15/2016 1320   KETONESUR 40 (A) 04/15/2016 1320   PROTEINUR NEGATIVE 04/15/2016 1320   NITRITE NEGATIVE 04/15/2016 1320   LEUKOCYTESUR NEGATIVE 04/15/2016 1320    ----------------------------------------------------------------------------------------------------------------   Imaging Results:    Ct Head Wo Contrast  Result Date: 04/15/2016 CLINICAL DATA:  Unwitnessed fall. Fell wall trying again out of bed. Trauma to head without loss of consciousness. Initial encounter. EXAM: CT HEAD WITHOUT CONTRAST CT CERVICAL SPINE WITHOUT CONTRAST TECHNIQUE: Multidetector CT imaging of the head and cervical spine was performed following the standard protocol without intravenous contrast. Multiplanar CT image reconstructions of the cervical spine were also generated. COMPARISON:  CT head without contrast 03/29/2016. CT of the head and face 12/24/2015. FINDINGS: CT HEAD FINDINGS Moderate atrophy and periventricular white matter hypoattenuation is stable. This is  slightly advanced for age. The ventricles proportionate to the degree of atrophy. A remote infarct of  the right cerebellum is stable. No acute infarct, hemorrhage, or mass lesion is present. No significant extraaxial fluid collection is present. Minimal soft tissue swelling is present in the left occipital sub scalp without an underlying fracture. The calvarium is intact. A remote right inferior orbital floor fracture has healed. The paranasal sinuses and mastoid air cells are clear. CT CERVICAL SPINE FINDINGS The cervical spine is imaged from the skullbase through T2-3. Asymmetric degenerative changes are present at the left atlantooccipital joint. Degenerative changes are present at C1-2. There is chronic loss of disc height from C3 through C7. Vertebral body heights and alignment are maintained. Facet hypertrophy is worse on the right. Soft tissues the neck demonstrate thyroid atrophy. Atherosclerotic calcifications are present at the carotid bifurcations bilaterally without significant stenoses. A large right pleural effusion is present. The lung apices are otherwise clear. IMPRESSION: 1. Stable moderate atrophy and white matter disease. No acute intracranial abnormality. 2. Minimal soft tissue swelling in the left occipital scalp may be related to the acute trauma. There is no underlying fracture. The 3. Remote right inferior orbital floor fracture has healed. 4. Multilevel degenerative changes in the cervical spine. 5. New moderate-sized right pleural effusion. Recommend two-view chest x-ray for further evaluation. Electronically Signed   By: San Morelle M.D.   On: 04/15/2016 14:40  Ct Cervical Spine Wo Contrast  Result Date: 04/15/2016 CLINICAL DATA:  Unwitnessed fall. Fell wall trying again out of bed. Trauma to head without loss of consciousness. Initial encounter. EXAM: CT HEAD WITHOUT CONTRAST CT CERVICAL SPINE WITHOUT CONTRAST TECHNIQUE: Multidetector CT imaging of the head and cervical  spine was performed following the standard protocol without intravenous contrast. Multiplanar CT image reconstructions of the cervical spine were also generated. COMPARISON:  CT head without contrast 03/29/2016. CT of the head and face 12/24/2015. FINDINGS: CT HEAD FINDINGS Moderate atrophy and periventricular white matter hypoattenuation is stable. This is slightly advanced for age. The ventricles proportionate to the degree of atrophy. A remote infarct of the right cerebellum is stable. No acute infarct, hemorrhage, or mass lesion is present. No significant extraaxial fluid collection is present. Minimal soft tissue swelling is present in the left occipital sub scalp without an underlying fracture. The calvarium is intact. A remote right inferior orbital floor fracture has healed. The paranasal sinuses and mastoid air cells are clear. CT CERVICAL SPINE FINDINGS The cervical spine is imaged from the skullbase through T2-3. Asymmetric degenerative changes are present at the left atlantooccipital joint. Degenerative changes are present at C1-2. There is chronic loss of disc height from C3 through C7. Vertebral body heights and alignment are maintained. Facet hypertrophy is worse on the right. Soft tissues the neck demonstrate thyroid atrophy. Atherosclerotic calcifications are present at the carotid bifurcations bilaterally without significant stenoses. A large right pleural effusion is present. The lung apices are otherwise clear. IMPRESSION: 1. Stable moderate atrophy and white matter disease. No acute intracranial abnormality. 2. Minimal soft tissue swelling in the left occipital scalp may be related to the acute trauma. There is no underlying fracture. The 3. Remote right inferior orbital floor fracture has healed. 4. Multilevel degenerative changes in the cervical spine. 5. New moderate-sized right pleural effusion. Recommend two-view chest x-ray for further evaluation. Electronically Signed   By: San Morelle M.D.   On: 04/15/2016 14:40  Dg Chest Port 1 View  Result Date: 04/15/2016 CLINICAL DATA:  Unwitnessed fall today.  Pleural effusion. EXAM: PORTABLE CHEST 1 VIEW COMPARISON:  03/29/2016 FINDINGS: Heart is  borderline in size. Mild vascular congestion. Bibasilar atelectasis. No visible effusions or pneumothorax. No acute bony abnormality. IMPRESSION: Borderline heart size with vascular congestion. Bibasilar atelectasis. Electronically Signed   By: Rolm Baptise M.D.   On: 04/15/2016 15:19   My personal review of EKG: Rhythm NSR,   no Acute ST changes   Assessment & Plan:     1. Melena. With history of ongoing heartburn and gastric in for several weeks, suspicious for upper GI bleed, BUN borderline high, will be admitted to telemetry, type screen, IV PPI, and tinea Carafate, hold aspirin, clear liquid diet, monitor H&H, [facility] has been consulted by ER. We'll keep her nothing by mouth after midnight as she will likely require EGD.  2. GERD. As above.  3. Generalized weakness with multiple falls. PT evaluation may require SNF.  4. Essential hypertension. Appears dehydrated hold ACE inhibitor, as needed IV hydralazine.  5. Dehydration with hyponatremia and hypokalemia. Normal saline bolus and maintenance, repeat electrolytes in the morning.  6. Leukocytosis. Likely reactionary from #1 above. UA unremarkable, chest x-ray has atelectasis. Patient has no cough or shortness of breath, will monitor temperature curve and repeat CBC in the morning.  7. DM type II. Check A1c, sugars currently for 22, will give NovoLog subcutaneous bolus then do moderate dose every 4 hrs sliding scale, if sugars become hard to control will place her on IV glucose stabilizer.   DVT Prophylaxis  SCDs    AM Labs Ordered, also please review Full Orders  Family Communication: Admission, patients condition and plan of care including tests being ordered have been discussed with the patient and Daughter who  indicate understanding and agree with the plan and Code Status.  Code Status Full  Likely DC to  SNF  Condition GUARDED     Consults called: [facility] by ER    Admission status: Inpt   Time spent in minutes : *35   Lala Lund K M.D on 04/15/2016 at 3:33 PM  Between 7am to 7pm - Pager - [phone]. After 7pm go to [email or web address] - password [facility]  [facility] - Office  [phone]

## 2016-04-16 ENCOUNTER — Inpatient Hospital Stay (HOSPITAL_COMMUNITY): Payer: Medicare Other

## 2016-04-16 DIAGNOSIS — K921 Melena: Secondary | ICD-10-CM

## 2016-04-16 LAB — GLUCOSE, CAPILLARY
GLUCOSE-CAPILLARY: 121 mg/dL — AB (ref 65–99)
GLUCOSE-CAPILLARY: 131 mg/dL — AB (ref 65–99)
GLUCOSE-CAPILLARY: 216 mg/dL — AB (ref 65–99)
GLUCOSE-CAPILLARY: 87 mg/dL (ref 65–99)
Glucose-Capillary: 109 mg/dL — ABNORMAL HIGH (ref 65–99)

## 2016-04-16 LAB — BASIC METABOLIC PANEL
Anion gap: 4 — ABNORMAL LOW (ref 5–15)
BUN: 27 mg/dL — AB (ref 6–20)
CALCIUM: 9.9 mg/dL (ref 8.9–10.3)
CO2: 24 mmol/L (ref 22–32)
CREATININE: 0.86 mg/dL (ref 0.44–1.00)
Chloride: 107 mmol/L (ref 101–111)
GFR calc Af Amer: >60 mL/min (ref >60)
GLUCOSE: 94 mg/dL (ref 65–99)
Potassium: 3.8 mmol/L (ref 3.5–5.1)
Sodium: 135 mmol/L (ref 135–145)

## 2016-04-16 LAB — ABO/RH: ABO/RH(D): A POS

## 2016-04-16 LAB — CBC
HEMATOCRIT: 27.2 % — AB (ref 36.0–46.0)
Hemoglobin: 9.3 g/dL — ABNORMAL LOW (ref 12.0–15.0)
MCH: 29.6 pg (ref 26.0–34.0)
MCHC: 34.2 g/dL (ref 30.0–36.0)
MCV: 86.6 fL (ref 78.0–100.0)
PLATELETS: 295 10*3/uL (ref 150–400)
RBC: 3.14 MIL/uL — ABNORMAL LOW (ref 3.87–5.11)
RDW: 16.1 % — AB (ref 11.5–15.5)
WBC: 20.2 10*3/uL — ABNORMAL HIGH (ref 4.0–10.5)

## 2016-04-16 LAB — HEMOGLOBIN AND HEMATOCRIT, BLOOD
HCT: 24.6 % — ABNORMAL LOW (ref 36.0–46.0)
HEMATOCRIT: 24.9 % — AB (ref 36.0–46.0)
HEMATOCRIT: 25.3 % — AB (ref 36.0–46.0)
HEMOGLOBIN: 8.3 g/dL — AB (ref 12.0–15.0)
Hemoglobin: 8.5 g/dL — ABNORMAL LOW (ref 12.0–15.0)
Hemoglobin: 8.4 g/dL — ABNORMAL LOW (ref 12.0–15.0)

## 2016-04-16 MED ORDER — DOCUSATE SODIUM 100 MG PO CAPS
200.0000 mg | ORAL_CAPSULE | Freq: Two times a day (BID) | ORAL | Status: DC
  Administered 2016-04-16 – 2016-04-17 (×2): 200 mg via ORAL
  Filled 2016-04-16 (×4): qty 2

## 2016-04-16 MED ORDER — AMPICILLIN-SULBACTAM SODIUM 1.5 (1-0.5) G IJ SOLR
1.5000 g | Freq: Four times a day (QID) | INTRAMUSCULAR | Status: DC
  Administered 2016-04-16 – 2016-04-27 (×44): 1.5 g via INTRAVENOUS
  Filled 2016-04-16 (×49): qty 1.5

## 2016-04-16 MED ORDER — BISACODYL 10 MG RE SUPP
10.0000 mg | Freq: Every day | RECTAL | Status: DC
  Administered 2016-04-16 – 2016-04-27 (×10): 10 mg via RECTAL
  Filled 2016-04-16 (×11): qty 1

## 2016-04-16 MED ORDER — INSULIN ASPART 100 UNIT/ML ~~LOC~~ SOLN
0.0000 [IU] | Freq: Four times a day (QID) | SUBCUTANEOUS | Status: DC
  Administered 2016-04-16 – 2016-04-17 (×2): 2 [IU] via SUBCUTANEOUS
  Administered 2016-04-17 (×2): 1 [IU] via SUBCUTANEOUS
  Administered 2016-04-18: 3 [IU] via SUBCUTANEOUS
  Administered 2016-04-18 (×3): 2 [IU] via SUBCUTANEOUS
  Administered 2016-04-19: 3 [IU] via SUBCUTANEOUS
  Administered 2016-04-19: 7 [IU] via SUBCUTANEOUS

## 2016-04-16 MED ORDER — ALBUTEROL SULFATE (2.5 MG/3ML) 0.083% IN NEBU
2.5000 mg | INHALATION_SOLUTION | Freq: Four times a day (QID) | RESPIRATORY_TRACT | Status: DC | PRN
  Administered 2016-04-16: 2.5 mg via RESPIRATORY_TRACT
  Filled 2016-04-16: qty 3

## 2016-04-16 NOTE — Progress Notes (Signed)
Initial Nutrition Assessment  DOCUMENTATION CODES:   Not applicable  INTERVENTION:  -Glucerna Shake po TID, each supplement provides 220 kcal and 10 grams of protein -RD to continue to monitor  NUTRITION DIAGNOSIS:   Inadequate oral intake related to poor appetite, other (see comment) (Heartburn / Burning sensation) as evidenced by per patient/family report.  GOAL:   Patient will meet greater than or equal to 90% of their needs  MONITOR:   PO intake, I & O's, Labs, Weight trends, Supplement acceptance  REASON FOR ASSESSMENT:   Malnutrition Screening Tool    ASSESSMENT:   Hannah Medsker  is a 81 y.o. female, With history of essential hypertension, diabetes mellitus type 2, dyslipidemia, peptic ulcer disease, poor balance with multiple falls recently started using a walker still lives at home whose been experiencing some dark stools for the last few days along with generalized weakness.  Spoke with Ms. Sula, daughter at bedside. Daughter states PTA since May, pt has eaten small bites of food and drinks glucerna shakes to sustain weight. Per chart pt exhibits 15#/8.6% severe wt loss in 3 months. During this time span she exhibited a "burning sensation" with everything that she ate. Glucerna was ok, but was not tolerating much food. Pt states that burning has ceased since admission, started on PPI. Spoke briefly with GI, pt to have EGD tomorrow, suspect ulcer in addition to GERD.  Nutrition-Focused physical exam completed. Findings are no fat depletion, no muscle depletion, and no edema.   Labs and medications reviewed: Colace, Dulcolax, Zofran PRN NaCl w/ KCL 103mEq @ 136mL/hr   Diet Order:  Diet clear liquid Room service appropriate? Yes; Fluid consistency: Thin Diet NPO time specified  Skin:  Reviewed, no issues  Last BM:  7/29  Height:   Ht Readings from Last 1 Encounters:  04/15/16 5\' 2"  (1.575 m)    Weight:   Wt Readings from Last 1 Encounters:   04/16/16 158 lb 1.1 oz (71.7 kg)    Ideal Body Weight:  50 kg  BMI:  Body mass index is 28.91 kg/m.  Estimated Nutritional Needs:   Kcal:  1250-1400 calories  Protein:  70-85 grams  Fluid:  >/= 1.25L  EDUCATION NEEDS:   No education needs identified at this time  Satira Anis. Crystall Donaldson, MS, RD LDN Inpatient Clinical Dietitian Pager [phone]

## 2016-04-16 NOTE — Consult Note (Signed)
Consultation  Referring Provider:  [facility] - Dr Candiss Norse Primary Care Physician:  [name], MD Primary Gastroenterologist:  Former Dr Olevia Perches  Reason for Consultation:   Anemia, melena  HPI: [name] [name] is a 82 y.o. female and previously to Dr. Delfin Edis from prior colonoscopies. Patient has history of adenomatous colon polyps and diverticulosis. Last colonoscopy was done in 2011 with finding of moderate diverticulosis and no polyps. She has not had EGD at least that I can see in the past several years. Patient was admitted yesterday through the emergency room after a fall at home with altered mental status. She was apparently hallucinating when her daughter found her in her glucose was over 400. She has had multiple recent falls at home. She was found to have dark Hemoccult-positive stool in the emergency room also noted to be dehydrated with possible UTI and a WBC of 21.5. She had apparently been complaining of some epigastric discomfort at home recently and had been on Pepcid and Carafate. She does take a baby aspirin daily no other blood thinners. She is currently a poor historian but denies any abdominal discomfort today she did have 1 episode of vomiting earlier in the day no obvious blood. No bowel movement today. Labs done on 03/26/2016 hemoglobin 11.4 hematocrit of 32, yesterday WBC 21.5 hemoglobin 12.5 hematocrit of 36 and today WBC of 20.2 hemoglobin 9.3 hematocrit of 27.2, BUN 27 and creatinine 0.86. UA was positive on admission sign Chest x-ray this morning shows new densities in the right lung consistent with pneumonia. She is congested but denies shortness of breath.   Past Medical History:  Diagnosis Date  . DDD (degenerative disc disease), lumbar   . Diabetes mellitus   . Diverticulosis   . Endocervical polyp   . Endometrial polyp   . GERD (gastroesophageal reflux disease)   . Hiatal hernia   . Hx of adenomatous colonic polyps   . Hyperlipidemia     . Hypertension   . Hyponatremia     Past Surgical History:  Procedure Laterality Date  . CHOLECYSTECTOMY    . DILATION AND CURETTAGE OF UTERUS    . HYSTEROSCOPY    . TONSILLECTOMY      Prior to Admission medications   Medication Sig Start Date End Date Taking? Authorizing Provider  acetaminophen (TYLENOL) 325 MG tablet Take 325 mg by mouth every 6 (six) hours as needed for moderate pain or headache.    Yes Historical Provider, MD  aspirin 81 MG tablet Take 162 mg by mouth at bedtime.    Yes Historical Provider, MD  aspirin-sod bicarb-citric acid (ALKA-SELTZER) 325 MG TBEF tablet Take 325 mg by mouth every 6 (six) hours as needed (acid reflux).   Yes Historical Provider, MD  glipiZIDE (GLUCOTROL XL) 10 MG 24 hr tablet Take 10 mg by mouth 2 (two) times daily. 03/14/16  Yes Historical Provider, MD  losartan (COZAAR) 100 MG tablet Take 100 mg by mouth daily. 03/19/16  Yes Historical Provider, MD  metFORMIN (GLUCOPHAGE) 1000 MG tablet Take 1,000 mg by mouth 2 (two) times daily. Reported on 01/20/2016   Yes Historical Provider, MD  ondansetron (ZOFRAN) 4 MG tablet Take 4 mg by mouth every 6 (six) hours as needed for nausea or vomiting.   Yes Historical Provider, MD  sucralfate (CARAFATE) 1 g tablet Take 1 g by mouth 4 (four) times daily as needed (pain).   Yes Historical Provider, MD  erythromycin ophthalmic ointment Apply small amount to left lower  lid 4 times a day. Patient not taking: Reported on 03/26/2016 01/20/16   Darlyne Russian, MD  famotidine (PEPCID) 20 MG tablet Take 1 tablet (20 mg total) by mouth 2 (two) times daily. Patient not taking: Reported on 04/15/2016 03/26/16   Dorie Rank, MD  ondansetron (ZOFRAN) 4 MG tablet Take 1 tablet (4 mg total) by mouth every 6 (six) hours. Patient not taking: Reported on 04/15/2016 03/26/16   Dorie Rank, MD    Current Facility-Administered Medications  Medication Dose Route Frequency Provider Last Rate Last Dose  . 0.9 % NaCl with KCl 40 mEq / L  infusion    Intravenous Continuous Thurnell Lose, MD 100 mL/hr at 04/16/16 0210 100 mL/hr at 04/16/16 0210  . albuterol (PROVENTIL) (2.5 MG/3ML) 0.083% nebulizer solution 2.5 mg  2.5 mg Nebulization Q6H PRN Thurnell Lose, MD   2.5 mg at 04/16/16 0821  . ampicillin-sulbactam (UNASYN) 1.5 g in sodium chloride 0.9 % 50 mL IVPB  1.5 g Intravenous Q6H Thurnell Lose, MD   1.5 g at 04/16/16 0937  . erythromycin ophthalmic ointment   Both Eyes QHS Thurnell Lose, MD      . feeding supplement (BOOST / RESOURCE BREEZE) liquid 1 Container  1 Container Oral TID BM Thurnell Lose, MD      . hydrALAZINE (APRESOLINE) injection 10 mg  10 mg Intravenous Q6H PRN Thurnell Lose, MD      . HYDROcodone-acetaminophen (NORCO/VICODIN) 5-325 MG per tablet 1 tablet  1 tablet Oral Q4H PRN Thurnell Lose, MD      . insulin aspart (novoLOG) injection 0-15 Units  0-15 Units Subcutaneous Q4H Thurnell Lose, MD   5 Units at 04/16/16 0008  . insulin aspart (novoLOG) injection 14 Units  14 Units Subcutaneous Once Thurnell Lose, MD      . ondansetron [facility]) tablet 4 mg  4 mg Oral Q6H PRN Thurnell Lose, MD       Or  . ondansetron (ZOFRAN) injection 4 mg  4 mg Intravenous Q6H PRN Thurnell Lose, MD      . pantoprazole (PROTONIX) 80 mg in sodium chloride 0.9 % 250 mL (0.32 mg/mL) infusion  8 mg/hr Intravenous Continuous Thurnell Lose, MD 25 mL/hr at 04/16/16 0237 8 mg/hr at 04/16/16 0237  . [START ON 04/19/2016] pantoprazole (PROTONIX) injection 40 mg  40 mg Intravenous Q12H Thurnell Lose, MD      . sodium chloride flush (NS) 0.9 % injection 3 mL  3 mL Intravenous Q12H Thurnell Lose, MD      . sucralfate (CARAFATE) tablet 1 g  1 g Oral QID PRN Thurnell Lose, MD        Allergies as of 04/15/2016 - Review Complete 04/15/2016  Allergen Reaction Noted  . Ace inhibitors Cough 04/06/2011  . Prednisone  01/11/2010  . Sitagliptin  01/03/2016    Family History  Problem Relation Age of Onset  . Hypertension  Mother   . Stroke Mother   . Diabetes Sister     x 2  . Lung cancer Brother     mets  . Alcohol abuse Father   . Breast cancer Cousin     Mat. 1st cousin-Age 3    Social History   Social History  . Marital status: Widowed    Spouse name: N/A  . Number of children: 1  . Years of education: N/A   Occupational History  . retired Retired   Science writer  History Main Topics  . Smoking status: Never Smoker  . Smokeless tobacco: Never Used  . Alcohol use No  . Drug use: No  . Sexual activity: No   Other Topics Concern  . Not on file   Social History Narrative  . No narrative on file    Review of Systems: Pertinent positive and negative review of systems were noted in the above HPI section.  All other review of systems was otherwise negative.  Physical Exam: Vital signs in last 24 hours: Temp:  [97.5 F (36.4 C)-98.6 F (37 C)] 98.4 F (36.9 C) (07/31 0500) Pulse Rate:  [98-115] 98 (07/31 0500) Resp:  [16-26] 18 (07/31 0500) BP: (91-126)/(62-74) 112/67 (07/31 0500) SpO2:  [94 %-100 %] 94 % (07/31 0821) Weight:  [154 lb 1.6 oz (69.9 kg)-158 lb 1.1 oz (71.7 kg)] 158 lb 1.1 oz (71.7 kg) (07/31 0500) Last BM Date: 04/14/16 General:   Alert,  Well-developed, Chronically ill-appearing pale elderly white female, pleasant and cooperative in NAD Head:  Normocephalic and atraumatic. Eyes:  Sclera clear, no icterus.   Conjunctiva pink. Ears:  Normal auditory acuity. Nose:  No deformity, discharge,  or lesions. Mouth:  No deformity or lesions., Lips pale   Neck:  Supple; no masses or thyromegaly. Lungs: Crackles right lung base  Heart:  Regular rate and rhythm; no murmurs, clicks, rubs,  or gallops. Abdomen:  Soft,nontender, BS active,nonpalp mass or hsm.   Rectal:  Deferred , documented dark heme positive stool Msk:  Symmetrical without gross deformities. . Pulses:  Normal pulses noted. Extremities:  Without clubbing or edema. Neurologic:  Alert and  oriented x3  grossly  normal neurologically. Skin:  Intact without significant lesions or rashes.. Psych:  Alert and cooperative. Normal mood and affect.  Intake/Output from previous day: 07/30 0701 - 07/31 0700 In: 1683.3 [I.V.:1683.3] Out: 200 [Urine:200] Intake/Output this shift: No intake/output data recorded.  Lab Results:  Recent Labs  04/15/16 1315 04/15/16 1822 04/15/16 2250 04/16/16 0530  WBC 21.5*  --   --  20.2*  HGB 12.5 10.2* 9.5* 9.3*  HCT 36.9 30.6* 27.0* 27.2*  PLT 455*  --   --  295   BMET  Recent Labs  04/15/16 1315 04/16/16 0530  NA 129* 135  K 3.3* 3.8  CL 89* 107  CO2 26 24  GLUCOSE 433* 94  BUN 35* 27*  CREATININE 1.11* 0.86  CALCIUM 11.9* 9.9   LFT  Recent Labs  04/15/16 1315  PROT 7.0  ALBUMIN 3.5  AST 21  ALT 19  ALKPHOS 60  BILITOT 1.3*   PT/INR  Recent Labs  04/15/16 1315  LABPROT 14.5  INR 1.13   Hepatitis Panel No results for input(s): HEPBSAG, HCVAB, HEPAIGM, HEPBIGM in the last 72 hours.   IMPRESSION:  #58 81 year old white female admitted with a fall at home associated with altered mental status, hyperglycemia weakness, leukocytosis and dark Hemoccult-positive stool. Patient had apparently been complaining of epigastric discomfort over the past few weeks. Chest x-ray this morning is consistent with a right pneumonia. We will rule out peptic ulcer disease, aspirin-induced gastropathy or occult lesion #2 acute on chronic anemia secondary to GI blood loss-I think some of the drop in hemoglobin since admission was likely volume related #3 adult-onset diabetes mellitus #4 sleep apnea #5 history of hypertension #6 diverticulosis    Plan; Patient will need EGD today or tomorrow, we do not have propofol available today and therefore may delay until tomorrow given new pneumonia etc.  Continue twice a day PPI Serial hemoglobins and transfuse for hemoglobin 8 or less Plans were discussed with the patient and she is agreeable Thank you, we  will follow with you     Amy Esterwood  04/16/2016, 9:43 AM

## 2016-04-16 NOTE — Progress Notes (Signed)
Patient had an episode of coffee ground emesis overnight.  Suctioned patient and dark [place] Markey emesis resulted. Notified NP on call; no new orders.  Will continue to monitor patient.

## 2016-04-16 NOTE — Evaluation (Signed)
Physical Therapy Evaluation Patient Details Name: [name] [name] MRN: [id number] DOB: [birth year] Today's Date: 04/16/2016   History of Present Illness  81 yo female admitted with melena, hyperglycemia, fall, AMS. hx of essential HTN, DM, multiple falls.  Clinical Impression  On eval, pt required Min-Mod assist for mobility. She was able to perform a stand pivot with RW x2. Multimodal cueing required during session. No family present. Recommend ST rehab at SNF. Will follow and progress activity as tolerated.     Follow Up Recommendations SNF    Equipment Recommendations  None recommended by PT    Recommendations for Other Services       Precautions / Restrictions Precautions Precautions: Fall Restrictions Weight Bearing Restrictions: No      Mobility  Bed Mobility Overal bed mobility: Needs Assistance Bed Mobility: Supine to Sit;Sit to Supine     Supine to sit: Mod assist;HOB elevated Sit to supine: Mod assist;HOB elevated   General bed mobility comments: Assist for trunk and bil LEs. Increased time.   Transfers Overall transfer level: Needs assistance Equipment used: Rolling walker (2 wheeled) Transfers: Sit to/from [facility] Sit to Stand: From elevated surface;Min assist         General transfer comment: Assist to rise, stabilize, control descent. Multimodal cues for safety, technique, hand placement. Stand pivot x2, bed<>bsc, with RW  Ambulation/Gait             General Gait Details: NT  Stairs            Wheelchair Mobility    Modified Rankin (Stroke Patients Only)       Balance Overall balance assessment: Needs assistance;History of Falls         Standing balance support: Bilateral upper extremity supported;During functional activity Standing balance-Leahy Scale: Poor                               Pertinent Vitals/Pain Pain Assessment: No/denies pain    Home Living Family/patient expects to be  discharged to:: Skilled nursing facility Living Arrangements: Alone                    Prior Function Level of Independence: Independent with assistive device(s)               Hand Dominance        Extremity/Trunk Assessment   Upper Extremity Assessment: Generalized weakness           Lower Extremity Assessment: Generalized weakness      Cervical / Trunk Assessment: Normal  Communication   Communication: No difficulties  Cognition Arousal/Alertness: Awake/alert Behavior During Therapy: WFL for tasks assessed/performed Overall Cognitive Status: No family/caregiver present to determine baseline cognitive functioning                      General Comments      Exercises        Assessment/Plan    PT Assessment Patient needs continued PT services  PT Diagnosis Difficulty walking;Generalized weakness;Altered mental status   PT Problem List Decreased strength;Decreased range of motion;Decreased activity tolerance;Decreased balance;Decreased cognition;Decreased mobility;Decreased knowledge of use of DME  PT Treatment Interventions DME instruction;Gait training;Functional mobility training;Balance training;Therapeutic exercise;Therapeutic activities   PT Goals (Current goals can be found in the Care Plan section) Acute Rehab PT Goals Patient Stated Goal: none stated. no family present PT Goal Formulation: Patient unable to participate in goal setting Time  For Goal Achievement: 04/30/16 Potential to Achieve Goals: Good    Frequency Min 3X/week   Barriers to discharge        Co-evaluation               End of Session Equipment Utilized During Treatment: Gait belt Activity Tolerance: Patient tolerated treatment well Patient left: in bed;with call bell/phone within reach;with bed alarm set           Time: [id number] PT Time Calculation (min) (ACUTE ONLY): 15 min   Charges:   PT Evaluation $PT Eval Low Complexity: 1 Procedure      PT G Codes:        Weston Anna, MPT Pager: [phone]

## 2016-04-16 NOTE — Progress Notes (Signed)
Pharmacy Antibiotic Note  RYANNAH BROOMELL is a 81 y.o. female admitted on 04/15/2016 with aspiration pneumonia.   Pharmacy has been consulted for unasyn dosing.  Plan: unasyn 1.5gm IV q6h Follow renal function, clinical course  Height: 5\' 2"  (157.5 cm) Weight: 158 lb 1.1 oz (71.7 kg) IBW/kg (Calculated) : 50.1  Temp (24hrs), Avg:97.9 F (36.6 C), Min:97.5 F (36.4 C), Max:98.6 F (37 C)   Recent Labs Lab 04/15/16 1315 04/16/16 0530  WBC 21.5* 20.2*  CREATININE 1.11* 0.86    Estimated Creatinine Clearance: 50 mL/min (by C-G formula based on SCr of 0.86 mg/dL).    Allergies  Allergen Reactions  . Ace Inhibitors Cough  . Prednisone     REACTION: lethargic  . Sitagliptin     Other reaction(s): Unknown    Antimicrobials this admission: 7/31 unasyn >>   Thank you for allowing pharmacy to be a part of this patient's care.  Dolly Rias RPh 04/16/2016, 8:08 AM Pager [phone]

## 2016-04-16 NOTE — Progress Notes (Signed)
PROGRESS NOTE                                                                                                                                                                                                             Patient Demographics:    [name] [name], is a 81 y.o. female, DOB - 02/19/1935, [id number]  Admit date - 04/15/2016   Admitting Physician Thurnell Lose, MD  Outpatient Primary MD for the patient is [name], MD  LOS - 1  Chief Complaint  Patient presents with  . Fall  . Altered Mental Status    PER FAMILY       Brief Narrative     [name] [name]  is a 81 y.o. female, With history of essential hypertension, diabetes mellitus type 2, dyslipidemia, peptic ulcer disease, poor balance with multiple falls recently started using a walker still lives at home whose been experiencing some dark stools for the last few days along with generalized weakness. According to the patient she fell off of her bed last night after trying to pull herself in a different direction while she was in bed, she did not hurt herself but was unable to get herself up, she was on the floor for several hours, this morning when her daughter called to check on her and did not get a response she went home and found her on the floor. She was then brought to the ER.  In the ER patient was found to have heme positive stool, she appeared slightly weak, dehydrated and pale, I was called to admit her for dark stools which were heme positive with generalized weakness and multiple falls. Patient currently is symptom free except for generalized weakness and dark colored stools for the last few days. She also says that recently she has been having a lot of heartburn and epigastric abdominal pain for which she was started on Pepcid and Carafate without much relief, she has had history of colonic polyps in the past but no history of peptic ulcer  disease. Other than above all other review of systems are negative.   Subjective:    Herb Grays today has, No headache, No chest pain, No abdominal pain - No Nausea now, No new weakness tingling or numbness, No Cough - SOB.     Assessment  & Plan :  1. Melena. With history of ongoing heartburn and gastric in for several weeks, suspicious for upper GI bleed, BUN borderline high, Continue telemetry, type screen has been obtained, H&H for now has remained stable, she did had a dark-colored emesis morning of 04/16/2016, continue IV PPI, Continue Carafate, hold aspirin, Or now nothing by mouth GI following likely will need EGD soon.  2. GERD. As above.  3. Generalized weakness with multiple falls. PT evaluation may require SNF.  4. Essential hypertension. Appears dehydrated hold ACE inhibitor, as needed IV hydralazine.  5. Dehydration with hyponatremia and hypokalemia. Normal saline bolus and maintenance, repeat electrolytes in the morning.  6. Leukocytosis. Likely reactionary from #1 above. UA unremarkable, chest x-ray has atelectasis. Patient has no cough or shortness of breath, will monitor temperature curve and repeat CBC in the morning.  7.  Likely mild hematemesis with right-sided aspiration on 04/16/2016. Currently not hypoxic, cover with Unasyn and monitor.   8. DM type II. Continue on sliding scale will change it to every 6 hours since he is nothing by mouth.  No results found for: HGBA1C CBG (last 3)   Recent Labs  04/15/16 2358 04/16/16 0450 04/16/16 0735  GLUCAP 216* 87 109*     Family Communication  :  Daughter on admission day 04-15-16  Code Status :  Full  Diet : NPO for EGD  Disposition Plan  :  Stay inpt  Consults  :  GI  Procedures  :      DVT Prophylaxis  :    SCDs    Lab Results  Component Value Date   PLT 295 04/16/2016    Inpatient Medications  Scheduled Meds: . ampicillin-sulbactam (UNASYN) IV  1.5 g Intravenous Q6H  .  bisacodyl  10 mg Rectal Daily  . docusate sodium  200 mg Oral BID  . erythromycin   Both Eyes QHS  . feeding supplement  1 Container Oral TID BM  . insulin aspart  0-15 Units Subcutaneous Q4H  . insulin aspart  14 Units Subcutaneous Once  . [START ON 04/19/2016] pantoprazole  40 mg Intravenous Q12H  . sodium chloride flush  3 mL Intravenous Q12H   Continuous Infusions: . 0.9 % NaCl with KCl 40 mEq / L 100 mL/hr (04/16/16 0210)  . pantoprozole (PROTONIX) infusion 8 mg/hr (04/16/16 0237)   PRN Meds:.albuterol, hydrALAZINE, HYDROcodone-acetaminophen, ondansetron **OR** ondansetron (ZOFRAN) IV, sucralfate  Antibiotics  :    Anti-infectives    Start     Dose/Rate Route Frequency Ordered Stop   04/16/16 0900  ampicillin-sulbactam (UNASYN) 1.5 g in sodium chloride 0.9 % 50 mL IVPB     1.5 g 100 mL/hr over 30 Minutes Intravenous Every 6 hours 04/16/16 0806           Objective:   Vitals:   04/15/16 2017 04/16/16 0500 04/16/16 0741 04/16/16 0821  BP: 120/62 112/67    Pulse: (!) 106 98    Resp: 18 18    Temp: 98.6 F (37 C) 98.4 F (36.9 C)    TempSrc: Oral Oral    SpO2: 99% 94% 98% 94%  Weight:  71.7 kg (158 lb 1.1 oz)    Height:        Wt Readings from Last 3 Encounters:  04/16/16 71.7 kg (158 lb 1.1 oz)  01/20/16 78.5 kg (173 lb)  08/25/15 80.3 kg (177 lb)     Intake/Output Summary (Last 24 hours) at 04/16/16 1005 Last data filed at 04/16/16 0700  Gross per 24  hour  Intake          1683.33 ml  Output              200 ml  Net          1483.33 ml     Physical Exam  Awake Alert, Oriented X 3, No new F.N deficits, Normal affect [place].AT,PERRAL Supple Neck,No JVD, No cervical lymphadenopathy appriciated.  Symmetrical Chest wall movement, Good air movement bilaterally, few R.sided rales RRR,No Gallops,Rubs or new Murmurs, No Parasternal Heave +ve B.Sounds, Abd Soft, No tenderness, No organomegaly appriciated, No rebound - guarding or rigidity. No Cyanosis, Clubbing or  edema, No new Rash or bruise       Data Review:    CBC  Recent Labs Lab 04/15/16 1315 04/15/16 1822 04/15/16 2250 04/16/16 0530  WBC 21.5*  --   --  20.2*  HGB 12.5 10.2* 9.5* 9.3*  HCT 36.9 30.6* 27.0* 27.2*  PLT 455*  --   --  295  MCV 86.0  --   --  86.6  MCH 29.1  --   --  29.6  MCHC 33.9  --   --  34.2  RDW 15.7*  --   --  16.1*  LYMPHSABS 1.3  --   --   --   MONOABS 1.2*  --   --   --   EOSABS 0.0  --   --   --   BASOSABS 0.1  --   --   --     Chemistries   Recent Labs Lab 04/15/16 1315 04/16/16 0530  NA 129* 135  K 3.3* 3.8  CL 89* 107  CO2 26 24  GLUCOSE 433* 94  BUN 35* 27*  CREATININE 1.11* 0.86  CALCIUM 11.9* 9.9  AST 21  --   ALT 19  --   ALKPHOS 60  --   BILITOT 1.3*  --    ------------------------------------------------------------------------------------------------------------------ No results for input(s): CHOL, HDL, LDLCALC, TRIG, CHOLHDL, LDLDIRECT in the last 72 hours.  No results found for: HGBA1C ------------------------------------------------------------------------------------------------------------------ No results for input(s): TSH, T4TOTAL, T3FREE, THYROIDAB in the last 72 hours.  Invalid input(s): FREET3 ------------------------------------------------------------------------------------------------------------------ No results for input(s): VITAMINB12, FOLATE, FERRITIN, TIBC, IRON, RETICCTPCT in the last 72 hours.  Coagulation profile  Recent Labs Lab 04/15/16 1315  INR 1.13    No results for input(s): DDIMER in the last 72 hours.  Cardiac Enzymes No results for input(s): CKMB, TROPONINI, MYOGLOBIN in the last 168 hours.  Invalid input(s): CK ------------------------------------------------------------------------------------------------------------------ No results found for: BNP  Micro Results No results found for this or any previous visit (from the past 240 hour(s)).  Radiology Reports Dg Chest 2  View  Result Date: 03/29/2016 CLINICAL DATA:  Hypotension and weakness for 1 week. History of diabetes and high blood pressure. EXAM: CHEST  2 VIEW COMPARISON:  Seventeen 17 FINDINGS: Normal heart size and pulmonary vascularity. No focal airspace disease or consolidation in the lungs. No blunting of costophrenic angles. No pneumothorax. Mediastinal contours appear intact. Degenerative changes in the spine and shoulders. Calcification of the aorta. IMPRESSION: No active cardiopulmonary disease. Electronically Signed   By: Lucienne Capers M.D.   On: 03/29/2016 06:15   Ct Head Wo Contrast  Result Date: 04/15/2016 CLINICAL DATA:  Unwitnessed fall. Fell wall trying again out of bed. Trauma to head without loss of consciousness. Initial encounter. EXAM: CT HEAD WITHOUT CONTRAST CT CERVICAL SPINE WITHOUT CONTRAST TECHNIQUE: Multidetector CT imaging of the head and cervical spine was  performed following the standard protocol without intravenous contrast. Multiplanar CT image reconstructions of the cervical spine were also generated. COMPARISON:  CT head without contrast 03/29/2016. CT of the head and face 12/24/2015. FINDINGS: CT HEAD FINDINGS Moderate atrophy and periventricular white matter hypoattenuation is stable. This is slightly advanced for age. The ventricles proportionate to the degree of atrophy. A remote infarct of the right cerebellum is stable. No acute infarct, hemorrhage, or mass lesion is present. No significant extraaxial fluid collection is present. Minimal soft tissue swelling is present in the left occipital sub scalp without an underlying fracture. The calvarium is intact. A remote right inferior orbital floor fracture has healed. The paranasal sinuses and mastoid air cells are clear. CT CERVICAL SPINE FINDINGS The cervical spine is imaged from the skullbase through T2-3. Asymmetric degenerative changes are present at the left atlantooccipital joint. Degenerative changes are present at C1-2.  There is chronic loss of disc height from C3 through C7. Vertebral body heights and alignment are maintained. Facet hypertrophy is worse on the right. Soft tissues the neck demonstrate thyroid atrophy. Atherosclerotic calcifications are present at the carotid bifurcations bilaterally without significant stenoses. A large right pleural effusion is present. The lung apices are otherwise clear. IMPRESSION: 1. Stable moderate atrophy and white matter disease. No acute intracranial abnormality. 2. Minimal soft tissue swelling in the left occipital scalp may be related to the acute trauma. There is no underlying fracture. The 3. Remote right inferior orbital floor fracture has healed. 4. Multilevel degenerative changes in the cervical spine. 5. New moderate-sized right pleural effusion. Recommend two-view chest x-ray for further evaluation. Electronically Signed   By: San Morelle M.D.   On: 04/15/2016 14:40  Ct Head Wo Contrast  Result Date: 03/29/2016 CLINICAL DATA:  Hypoglycemic and hypotensive for about a week. Fell 6 days ago. Weakness since the fall. Confusion, double vision, and slurred speech for 1 day. Unsteady gait. EXAM: CT HEAD WITHOUT CONTRAST TECHNIQUE: Contiguous axial images were obtained from the base of the skull through the vertex without intravenous contrast. COMPARISON:  12/24/2015 FINDINGS: Mild cerebral atrophy. Ventricular dilatation consistent with central atrophy. Low-attenuation changes in the deep white matter consistent with small vessel ischemia. No mass effect or midline shift. No abnormal extra-axial fluid collections. Gray-white matter junctions are distinct. Basal cisterns are not effaced. No evidence of acute intracranial hemorrhage. No depressed skull fractures. Visualized paranasal sinuses and mastoid air cells are not opacified. IMPRESSION: No acute intracranial abnormalities. Chronic atrophy and small vessel ischemic changes. Electronically Signed   By: Lucienne Capers  M.D.   On: 03/29/2016 04:26   Ct Cervical Spine Wo Contrast  Result Date: 04/15/2016 CLINICAL DATA:  Unwitnessed fall. Fell wall trying again out of bed. Trauma to head without loss of consciousness. Initial encounter. EXAM: CT HEAD WITHOUT CONTRAST CT CERVICAL SPINE WITHOUT CONTRAST TECHNIQUE: Multidetector CT imaging of the head and cervical spine was performed following the standard protocol without intravenous contrast. Multiplanar CT image reconstructions of the cervical spine were also generated. COMPARISON:  CT head without contrast 03/29/2016. CT of the head and face 12/24/2015. FINDINGS: CT HEAD FINDINGS Moderate atrophy and periventricular white matter hypoattenuation is stable. This is slightly advanced for age. The ventricles proportionate to the degree of atrophy. A remote infarct of the right cerebellum is stable. No acute infarct, hemorrhage, or mass lesion is present. No significant extraaxial fluid collection is present. Minimal soft tissue swelling is present in the left occipital sub scalp without an underlying fracture. The  calvarium is intact. A remote right inferior orbital floor fracture has healed. The paranasal sinuses and mastoid air cells are clear. CT CERVICAL SPINE FINDINGS The cervical spine is imaged from the skullbase through T2-3. Asymmetric degenerative changes are present at the left atlantooccipital joint. Degenerative changes are present at C1-2. There is chronic loss of disc height from C3 through C7. Vertebral body heights and alignment are maintained. Facet hypertrophy is worse on the right. Soft tissues the neck demonstrate thyroid atrophy. Atherosclerotic calcifications are present at the carotid bifurcations bilaterally without significant stenoses. A large right pleural effusion is present. The lung apices are otherwise clear. IMPRESSION: 1. Stable moderate atrophy and white matter disease. No acute intracranial abnormality. 2. Minimal soft tissue swelling in the left  occipital scalp may be related to the acute trauma. There is no underlying fracture. The 3. Remote right inferior orbital floor fracture has healed. 4. Multilevel degenerative changes in the cervical spine. 5. New moderate-sized right pleural effusion. Recommend two-view chest x-ray for further evaluation. Electronically Signed   By: San Morelle M.D.   On: 04/15/2016 14:40  Dg Chest Port 1 View  Result Date: 04/16/2016 CLINICAL DATA:  Increased cough and congestion this morning. EXAM: PORTABLE CHEST 1 VIEW COMPARISON:  Chest x-rays dated 04/15/2016 and 03/29/2016. FINDINGS: Today's exam is slightly hypoinspiratory with crowding of the perihilar bronchovascular markings. New patchy airspace opacities are seen at the right lung base. Left lung remains clear. Cardiomediastinal silhouette is stable. Atherosclerotic changes noted at the aortic arch. Osseous structures about the chest are unremarkable. IMPRESSION: Low lung volumes. New patchy airspace opacities at the right lung base, atelectasis versus early developing pneumonia. Aortic atherosclerosis. Electronically Signed   By: Franki Cabot M.D.   On: 04/16/2016 08:16  Dg Chest Port 1 View  Result Date: 04/15/2016 CLINICAL DATA:  Unwitnessed fall today.  Pleural effusion. EXAM: PORTABLE CHEST 1 VIEW COMPARISON:  03/29/2016 FINDINGS: Heart is borderline in size. Mild vascular congestion. Bibasilar atelectasis. No visible effusions or pneumothorax. No acute bony abnormality. IMPRESSION: Borderline heart size with vascular congestion. Bibasilar atelectasis. Electronically Signed   By: Rolm Baptise M.D.   On: 04/15/2016 15:19  Dg Chest Portable 1 View  Result Date: 03/26/2016 CLINICAL DATA:  Weakness, hypotension EXAM: PORTABLE CHEST 1 VIEW COMPARISON:  06/03/2008 FINDINGS: Cardiomediastinal silhouette is stable. No acute infiltrate or pleural effusion. No pulmonary edema. Mild degenerative changes thoracic spine. Degenerative changes bilateral  shoulders. IMPRESSION: No active disease. Electronically Signed   By: Lahoma Crocker M.D.   On: 03/26/2016 15:22   Dg Abd Portable 1v  Result Date: 04/16/2016 CLINICAL DATA:  Melena. Ongoing heartburn. Pelvic pain since this morning. EXAM: PORTABLE ABDOMEN - 1 VIEW COMPARISON:  None FINDINGS: Moderate stool burden throughout the colon. Nonobstructive bowel gas pattern. No free air, organomegaly or suspicious calcification. Degenerative changes in the lumbar spine and hips. No acute bony abnormality. IMPRESSION: Moderate stool burden.  No acute findings. Electronically Signed   By: Rolm Baptise M.D.   On: 04/16/2016 09:30   Time Spent in minutes  30   Justine Dines K M.D on 04/16/2016 at 10:05 AM  Between 7am to 7pm - Pager - [phone]  After 7pm go to [email or web address] - password [facility]  [facility] -  Office  [phone]

## 2016-04-17 ENCOUNTER — Inpatient Hospital Stay (HOSPITAL_COMMUNITY): Payer: Medicare Other | Admitting: Anesthesiology

## 2016-04-17 ENCOUNTER — Encounter (HOSPITAL_COMMUNITY)
Admission: EM | Disposition: A | Discharge status: Skilled Nursing Facility | Payer: Self-pay | Source: Home / Self Care | Attending: Internal Medicine

## 2016-04-17 ENCOUNTER — Inpatient Hospital Stay (HOSPITAL_COMMUNITY): Payer: Medicare Other

## 2016-04-17 ENCOUNTER — Encounter (HOSPITAL_COMMUNITY): Payer: Self-pay | Admitting: Anesthesiology

## 2016-04-17 DIAGNOSIS — K311 Adult hypertrophic pyloric stenosis: Secondary | ICD-10-CM

## 2016-04-17 HISTORY — PX: ESOPHAGOGASTRODUODENOSCOPY (EGD) WITH PROPOFOL: SHX5813

## 2016-04-17 LAB — CBC
HCT: 25 % — ABNORMAL LOW (ref 36.0–46.0)
Hemoglobin: 8.4 g/dL — ABNORMAL LOW (ref 12.0–15.0)
MCH: 30 pg (ref 26.0–34.0)
MCHC: 33.6 g/dL (ref 30.0–36.0)
MCV: 89.3 fL (ref 78.0–100.0)
PLATELETS: 233 10*3/uL (ref 150–400)
RBC: 2.8 MIL/uL — ABNORMAL LOW (ref 3.87–5.11)
RDW: 17.1 % — ABNORMAL HIGH (ref 11.5–15.5)
WBC: 12.2 10*3/uL — ABNORMAL HIGH (ref 4.0–10.5)

## 2016-04-17 LAB — BASIC METABOLIC PANEL
Anion gap: 4 — ABNORMAL LOW (ref 5–15)
BUN: 22 mg/dL — AB (ref 6–20)
CO2: 20 mmol/L — ABNORMAL LOW (ref 22–32)
CREATININE: 0.85 mg/dL (ref 0.44–1.00)
Calcium: 8.8 mg/dL — ABNORMAL LOW (ref 8.9–10.3)
Chloride: 111 mmol/L (ref 101–111)
GFR calc Af Amer: >60 mL/min (ref >60)
Glucose, Bld: 157 mg/dL — ABNORMAL HIGH (ref 65–99)
Potassium: 4.7 mmol/L (ref 3.5–5.1)
SODIUM: 135 mmol/L (ref 135–145)

## 2016-04-17 LAB — GLUCOSE, CAPILLARY
Glucose-Capillary: 171 mg/dL — ABNORMAL HIGH (ref 65–99)
Glucose-Capillary: 153 mg/dL — ABNORMAL HIGH (ref 65–99)
Glucose-Capillary: 130 mg/dL — ABNORMAL HIGH (ref 65–99)
Glucose-Capillary: 144 mg/dL — ABNORMAL HIGH (ref 65–99)

## 2016-04-17 LAB — HEMOGLOBIN A1C
HEMOGLOBIN A1C: 6.6 % — AB (ref 4.8–5.6)
Mean Plasma Glucose: 143 mg/dL

## 2016-04-17 LAB — MRSA PCR SCREENING: MRSA by PCR: NEGATIVE

## 2016-04-17 SURGERY — ESOPHAGOGASTRODUODENOSCOPY (EGD) WITH PROPOFOL
Anesthesia: Monitor Anesthesia Care

## 2016-04-17 MED ORDER — PROPOFOL 10 MG/ML IV BOLUS
INTRAVENOUS | Status: DC | PRN
  Administered 2016-04-17 (×4): 20 mg via INTRAVENOUS

## 2016-04-17 MED ORDER — PROPOFOL 10 MG/ML IV BOLUS
INTRAVENOUS | Status: AC
  Filled 2016-04-17: qty 20

## 2016-04-17 MED ORDER — KCL IN DEXTROSE-NACL 20-5-0.45 MEQ/L-%-% IV SOLN
INTRAVENOUS | Status: AC
  Administered 2016-04-17 – 2016-04-18 (×3): via INTRAVENOUS
  Filled 2016-04-17 (×3): qty 1000

## 2016-04-17 MED ORDER — PROPOFOL 500 MG/50ML IV EMUL
INTRAVENOUS | Status: DC | PRN
  Administered 2016-04-17: 30 ug/kg/min via INTRAVENOUS

## 2016-04-17 MED ORDER — POTASSIUM CHLORIDE IN NACL 40-0.9 MEQ/L-% IV SOLN
INTRAVENOUS | Status: DC
  Administered 2016-04-17: 100 mL/h via INTRAVENOUS
  Filled 2016-04-17 (×2): qty 1000

## 2016-04-17 MED ORDER — IOPAMIDOL (ISOVUE-300) INJECTION 61%
100.0000 mL | Freq: Once | INTRAVENOUS | Status: AC | PRN
  Administered 2016-04-17: 100 mL via INTRAVENOUS

## 2016-04-17 SURGICAL SUPPLY — 15 items

## 2016-04-17 NOTE — Progress Notes (Signed)
PROGRESS NOTE                                                                                                                                                                                                             Patient Demographics:    [name] [name], is a 81 y.o. female, DOB - 11/28/1934, [id number]  Admit date - 04/15/2016   Admitting Physician Thurnell Lose, MD  Outpatient Primary MD for the patient is [name], MD  LOS - 2  Chief Complaint  Patient presents with  . Fall  . Altered Mental Status    PER FAMILY       Brief Narrative     [name] [name]  is a 81 y.o. female, With history of essential hypertension, diabetes mellitus type 2, dyslipidemia, peptic ulcer disease, poor balance with multiple falls recently started using a walker still lives at home whose been experiencing some dark stools for the last few days along with generalized weakness. According to the patient she fell off of her bed last night after trying to pull herself in a different direction while she was in bed, she did not hurt herself but was unable to get herself up, she was on the floor for several hours, this morning when her daughter called to check on her and did not get a response she went home and found her on the floor. She was then brought to the ER.  In the ER patient was found to have heme positive stool, she appeared slightly weak, dehydrated and pale, I was called to admit her for dark stools which were heme positive with generalized weakness and multiple falls. Patient currently is symptom free except for generalized weakness and dark colored stools for the last few days. She also says that recently she has been having a lot of heartburn and epigastric abdominal pain for which she was started on Pepcid and Carafate without much relief, she has had history of colonic polyps in the past but no history of peptic ulcer  disease. Other than above all other review of systems are negative.   Subjective:    Herb Grays today has, No headache, No chest pain, No abdominal pain - No Nausea now, No new weakness tingling or numbness, No Cough - SOB.     Assessment  & Plan :  1. Acute upper GI bleed with melena and acute blood loss anemia. With history of ongoing heartburn and gastric in for several weeks, suspicious for upper GI bleed, BUN borderline high, Continue telemetry, type screen has been obtained, H&H for now has remained stable enough and she has not required transfusion, we will continue to monitor H&H, continue IV PPI and oral Carafate, hold aspirin, [facility] is following due for EGD later today.  2. GERD. As above.  3. Generalized weakness with multiple falls. PT evaluation may require SNF.  4. Essential hypertension. Appears dehydrated with borderline blood pressures hold ACE inhibitor, as needed IV hydralazine.  5. Dehydration with hyponatremia and hypokalemia. Resolved after adequate hydration with IV fluids.  6. Leukocytosis. Likely reactionary and due to #7 below, supportive care plus treatment for #7 below, afebrile and appears stable now.  7.  Mild hematemesis with right-sided aspiration pneumonitis on 04/16/2016. Currently not hypoxic, cover with Unasyn and monitor. If stable switch to Augmentin for a total of 5 days treatment.  8. DM type II. Continue on sliding scale will change it to every 6 hours since he is nothing by mouth.  Lab Results  Component Value Date   HGBA1C 6.6 (H) 04/15/2016   CBG (last 3)   Recent Labs  04/16/16 1708 04/16/16 2200 04/17/16 0506  GLUCAP 131* 171* 153*     Family Communication  :  Daughter on admission day 04-15-16  Code Status :  Full  Diet : NPO for EGD  Disposition Plan  :  Stay inpt  Consults  :  GI  Procedures  :  EGD due later today  DVT Prophylaxis  :    SCDs    Lab Results  Component Value Date   PLT 233  04/17/2016    Inpatient Medications  Scheduled Meds: . ampicillin-sulbactam (UNASYN) IV  1.5 g Intravenous Q6H  . bisacodyl  10 mg Rectal Daily  . docusate sodium  200 mg Oral BID  . erythromycin   Both Eyes QHS  . feeding supplement  1 Container Oral TID BM  . insulin aspart  0-9 Units Subcutaneous Q6H  . insulin aspart  14 Units Subcutaneous Once  . [START ON 04/19/2016] pantoprazole  40 mg Intravenous Q12H  . sodium chloride flush  3 mL Intravenous Q12H   Continuous Infusions: . 0.9 % NaCl with KCl 40 mEq / L 100 mL/hr (04/17/16 0122)  . pantoprozole (PROTONIX) infusion 8 mg/hr (04/17/16 0122)   PRN Meds:.albuterol, hydrALAZINE, HYDROcodone-acetaminophen, ondansetron **OR** ondansetron (ZOFRAN) IV, sucralfate  Antibiotics  :    Anti-infectives    Start     Dose/Rate Route Frequency Ordered Stop   04/16/16 0900  ampicillin-sulbactam (UNASYN) 1.5 g in sodium chloride 0.9 % 50 mL IVPB     1.5 g 100 mL/hr over 30 Minutes Intravenous Every 6 hours 04/16/16 0806           Objective:   Vitals:   04/16/16 0821 04/16/16 1356 04/16/16 1900 04/17/16 0522  BP:  (!) 90/44 117/71 (!) 116/46  Pulse:  90 94 82  Resp:  18 18 17   Temp:  98.2 F (36.8 C) 97.7 F (36.5 C) 98.1 F (36.7 C)  TempSrc:  Oral Oral Axillary  SpO2: 94% 94% 95% 95%  Weight:    74.8 kg (164 lb 14.5 oz)  Height:        Wt Readings from Last 3 Encounters:  04/17/16 74.8 kg (164 lb 14.5 oz)  01/20/16 78.5 kg (173 lb)  08/25/15 80.3 kg (177 lb)     Intake/Output Summary (Last 24 hours) at 04/17/16 0917 Last data filed at 04/17/16 0700  Gross per 24 hour  Intake          1363.33 ml  Output                0 ml  Net          1363.33 ml     Physical Exam  Awake , Oriented X 2, No new F.N deficits, Normal affect [place].AT,PERRAL Supple Neck,No JVD, No cervical lymphadenopathy appriciated.  Symmetrical Chest wall movement, Good air movement bilaterally, few R.sided rales RRR,No Gallops,Rubs or new  Murmurs, No Parasternal Heave +ve B.Sounds, Abd Soft, No tenderness, No organomegaly appriciated, No rebound - guarding or rigidity. No Cyanosis, Clubbing or edema, No new Rash or bruise       Data Review:    CBC  Recent Labs Lab 04/15/16 1315  04/16/16 0530 04/16/16 1101 04/16/16 1624 04/16/16 2325 04/17/16 0529  WBC 21.5*  --  20.2*  --   --   --  12.2*  HGB 12.5  < > 9.3* 8.5* 8.4* 8.3* 8.4*  HCT 36.9  < > 27.2* 24.9* 24.6* 25.3* 25.0*  PLT 455*  --  295  --   --   --  233  MCV 86.0  --  86.6  --   --   --  89.3  MCH 29.1  --  29.6  --   --   --  30.0  MCHC 33.9  --  34.2  --   --   --  33.6  RDW 15.7*  --  16.1*  --   --   --  17.1*  LYMPHSABS 1.3  --   --   --   --   --   --   MONOABS 1.2*  --   --   --   --   --   --   EOSABS 0.0  --   --   --   --   --   --   BASOSABS 0.1  --   --   --   --   --   --   < > = values in this interval not displayed.  Chemistries   Recent Labs Lab 04/15/16 1315 04/16/16 0530 04/17/16 0529  NA 129* 135 135  K 3.3* 3.8 4.7  CL 89* 107 111  CO2 26 24 20*  GLUCOSE 433* 94 157*  BUN 35* 27* 22*  CREATININE 1.11* 0.86 0.85  CALCIUM 11.9* 9.9 8.8*  AST 21  --   --   ALT 19  --   --   ALKPHOS 60  --   --   BILITOT 1.3*  --   --    ------------------------------------------------------------------------------------------------------------------ No results for input(s): CHOL, HDL, LDLCALC, TRIG, CHOLHDL, LDLDIRECT in the last 72 hours.  Lab Results  Component Value Date   HGBA1C 6.6 (H) 04/15/2016   ------------------------------------------------------------------------------------------------------------------ No results for input(s): TSH, T4TOTAL, T3FREE, THYROIDAB in the last 72 hours.  Invalid input(s): FREET3 ------------------------------------------------------------------------------------------------------------------ No results for input(s): VITAMINB12, FOLATE, FERRITIN, TIBC, IRON, RETICCTPCT in the last 72  hours.  Coagulation profile  Recent Labs Lab 04/15/16 1315  INR 1.13    No results for input(s): DDIMER in the last 72 hours.  Cardiac Enzymes No results for input(s): CKMB, TROPONINI, MYOGLOBIN in the last 168 hours.  Invalid input(s): CK ------------------------------------------------------------------------------------------------------------------ No results found for: BNP  Micro Results No results found for this or any previous visit (from the past 240 hour(s)).  Radiology Reports Dg Chest 2 View  Result Date: 03/29/2016 CLINICAL DATA:  Hypotension and weakness for 1 week. History of diabetes and high blood pressure. EXAM: CHEST  2 VIEW COMPARISON:  Seventeen 17 FINDINGS: Normal heart size and pulmonary vascularity. No focal airspace disease or consolidation in the lungs. No blunting of costophrenic angles. No pneumothorax. Mediastinal contours appear intact. Degenerative changes in the spine and shoulders. Calcification of the aorta. IMPRESSION: No active cardiopulmonary disease. Electronically Signed   By: Lucienne Capers M.D.   On: 03/29/2016 06:15   Ct Head Wo Contrast  Result Date: 04/15/2016 CLINICAL DATA:  Unwitnessed fall. Fell wall trying again out of bed. Trauma to head without loss of consciousness. Initial encounter. EXAM: CT HEAD WITHOUT CONTRAST CT CERVICAL SPINE WITHOUT CONTRAST TECHNIQUE: Multidetector CT imaging of the head and cervical spine was performed following the standard protocol without intravenous contrast. Multiplanar CT image reconstructions of the cervical spine were also generated. COMPARISON:  CT head without contrast 03/29/2016. CT of the head and face 12/24/2015. FINDINGS: CT HEAD FINDINGS Moderate atrophy and periventricular white matter hypoattenuation is stable. This is slightly advanced for age. The ventricles proportionate to the degree of atrophy. A remote infarct of the right cerebellum is stable. No acute infarct, hemorrhage, or mass  lesion is present. No significant extraaxial fluid collection is present. Minimal soft tissue swelling is present in the left occipital sub scalp without an underlying fracture. The calvarium is intact. A remote right inferior orbital floor fracture has healed. The paranasal sinuses and mastoid air cells are clear. CT CERVICAL SPINE FINDINGS The cervical spine is imaged from the skullbase through T2-3. Asymmetric degenerative changes are present at the left atlantooccipital joint. Degenerative changes are present at C1-2. There is chronic loss of disc height from C3 through C7. Vertebral body heights and alignment are maintained. Facet hypertrophy is worse on the right. Soft tissues the neck demonstrate thyroid atrophy. Atherosclerotic calcifications are present at the carotid bifurcations bilaterally without significant stenoses. A large right pleural effusion is present. The lung apices are otherwise clear. IMPRESSION: 1. Stable moderate atrophy and white matter disease. No acute intracranial abnormality. 2. Minimal soft tissue swelling in the left occipital scalp may be related to the acute trauma. There is no underlying fracture. The 3. Remote right inferior orbital floor fracture has healed. 4. Multilevel degenerative changes in the cervical spine. 5. New moderate-sized right pleural effusion. Recommend two-view chest x-ray for further evaluation. Electronically Signed   By: San Morelle M.D.   On: 04/15/2016 14:40  Ct Head Wo Contrast  Result Date: 03/29/2016 CLINICAL DATA:  Hypoglycemic and hypotensive for about a week. Fell 6 days ago. Weakness since the fall. Confusion, double vision, and slurred speech for 1 day. Unsteady gait. EXAM: CT HEAD WITHOUT CONTRAST TECHNIQUE: Contiguous axial images were obtained from the base of the skull through the vertex without intravenous contrast. COMPARISON:  12/24/2015 FINDINGS: Mild cerebral atrophy. Ventricular dilatation consistent with central atrophy.  Low-attenuation changes in the deep white matter consistent with small vessel ischemia. No mass effect or midline shift. No abnormal extra-axial fluid collections. Gray-white matter junctions are distinct. Basal cisterns are not effaced. No evidence of acute intracranial hemorrhage. No depressed skull fractures. Visualized paranasal sinuses and mastoid air cells are not opacified. IMPRESSION: No acute intracranial abnormalities. Chronic atrophy and small vessel ischemic changes. Electronically Signed   By: Lucienne Capers  M.D.   On: 03/29/2016 04:26   Ct Cervical Spine Wo Contrast  Result Date: 04/15/2016 CLINICAL DATA:  Unwitnessed fall. Fell wall trying again out of bed. Trauma to head without loss of consciousness. Initial encounter. EXAM: CT HEAD WITHOUT CONTRAST CT CERVICAL SPINE WITHOUT CONTRAST TECHNIQUE: Multidetector CT imaging of the head and cervical spine was performed following the standard protocol without intravenous contrast. Multiplanar CT image reconstructions of the cervical spine were also generated. COMPARISON:  CT head without contrast 03/29/2016. CT of the head and face 12/24/2015. FINDINGS: CT HEAD FINDINGS Moderate atrophy and periventricular white matter hypoattenuation is stable. This is slightly advanced for age. The ventricles proportionate to the degree of atrophy. A remote infarct of the right cerebellum is stable. No acute infarct, hemorrhage, or mass lesion is present. No significant extraaxial fluid collection is present. Minimal soft tissue swelling is present in the left occipital sub scalp without an underlying fracture. The calvarium is intact. A remote right inferior orbital floor fracture has healed. The paranasal sinuses and mastoid air cells are clear. CT CERVICAL SPINE FINDINGS The cervical spine is imaged from the skullbase through T2-3. Asymmetric degenerative changes are present at the left atlantooccipital joint. Degenerative changes are present at C1-2. There is  chronic loss of disc height from C3 through C7. Vertebral body heights and alignment are maintained. Facet hypertrophy is worse on the right. Soft tissues the neck demonstrate thyroid atrophy. Atherosclerotic calcifications are present at the carotid bifurcations bilaterally without significant stenoses. A large right pleural effusion is present. The lung apices are otherwise clear. IMPRESSION: 1. Stable moderate atrophy and white matter disease. No acute intracranial abnormality. 2. Minimal soft tissue swelling in the left occipital scalp may be related to the acute trauma. There is no underlying fracture. The 3. Remote right inferior orbital floor fracture has healed. 4. Multilevel degenerative changes in the cervical spine. 5. New moderate-sized right pleural effusion. Recommend two-view chest x-ray for further evaluation. Electronically Signed   By: San Morelle M.D.   On: 04/15/2016 14:40  Dg Chest Port 1 View  Result Date: 04/16/2016 CLINICAL DATA:  Increased cough and congestion this morning. EXAM: PORTABLE CHEST 1 VIEW COMPARISON:  Chest x-rays dated 04/15/2016 and 03/29/2016. FINDINGS: Today's exam is slightly hypoinspiratory with crowding of the perihilar bronchovascular markings. New patchy airspace opacities are seen at the right lung base. Left lung remains clear. Cardiomediastinal silhouette is stable. Atherosclerotic changes noted at the aortic arch. Osseous structures about the chest are unremarkable. IMPRESSION: Low lung volumes. New patchy airspace opacities at the right lung base, atelectasis versus early developing pneumonia. Aortic atherosclerosis. Electronically Signed   By: Franki Cabot M.D.   On: 04/16/2016 08:16  Dg Chest Port 1 View  Result Date: 04/15/2016 CLINICAL DATA:  Unwitnessed fall today.  Pleural effusion. EXAM: PORTABLE CHEST 1 VIEW COMPARISON:  03/29/2016 FINDINGS: Heart is borderline in size. Mild vascular congestion. Bibasilar atelectasis. No visible effusions  or pneumothorax. No acute bony abnormality. IMPRESSION: Borderline heart size with vascular congestion. Bibasilar atelectasis. Electronically Signed   By: Rolm Baptise M.D.   On: 04/15/2016 15:19  Dg Chest Portable 1 View  Result Date: 03/26/2016 CLINICAL DATA:  Weakness, hypotension EXAM: PORTABLE CHEST 1 VIEW COMPARISON:  06/03/2008 FINDINGS: Cardiomediastinal silhouette is stable. No acute infiltrate or pleural effusion. No pulmonary edema. Mild degenerative changes thoracic spine. Degenerative changes bilateral shoulders. IMPRESSION: No active disease. Electronically Signed   By: Lahoma Crocker M.D.   On: 03/26/2016 15:22   Dg  Abd Portable 1v  Result Date: 04/16/2016 CLINICAL DATA:  Melena. Ongoing heartburn. Pelvic pain since this morning. EXAM: PORTABLE ABDOMEN - 1 VIEW COMPARISON:  None FINDINGS: Moderate stool burden throughout the colon. Nonobstructive bowel gas pattern. No free air, organomegaly or suspicious calcification. Degenerative changes in the lumbar spine and hips. No acute bony abnormality. IMPRESSION: Moderate stool burden.  No acute findings. Electronically Signed   By: Rolm Baptise M.D.   On: 04/16/2016 09:30   Time Spent in minutes  30   SINGH,PRASHANT K M.D on 04/17/2016 at 9:17 AM  Between 7am to 7pm - Pager - [phone]  After 7pm go to [email or web address] - password [email or web address]  [facility] -  Office  [phone]

## 2016-04-17 NOTE — H&P (View-Only) (Signed)
Patient ID: [name] [name], female   DOB: 01-28-1935, 81 y.o.   MRN: [id number]    Progress Note   Subjective   Sleeping but arouses easily-  Denies SOB. Denies abdominal pain, nausea/vomiting. Tech reports no stools overnight hgb 8.4 this am Wbc down to 12   Objective   Vital signs in last 24 hours: Temp:  [97.7 F (36.5 C)-98.2 F (36.8 C)] 98.1 F (36.7 C) (08/01 0522) Pulse Rate:  [82-94] 82 (08/01 0522) Resp:  [17-18] 17 (08/01 0522) BP: (90-117)/(44-71) 116/46 (08/01 0522) SpO2:  [94 %-95 %] 95 % (08/01 0522) Weight:  [164 lb 14.5 oz (74.8 kg)] 164 lb 14.5 oz (74.8 kg) (08/01 0522) Last BM Date: 04/16/16 General: elderly pale    white female in NAD Heart:  Regular rate and rhythm; no murmurs Lungs: Respirations even and unlabored, lungs CTA bilaterally anterior Abdomen:  Soft, large  Mildly tenderLLQ/LMQ and nondistended. Normal bowel sounds. Extremities:  Without edema. Neurologic:  Alert and x2  grossly normal neurologically. Psych:  Cooperative. Normal mood and affect.  Intake/Output from previous day: 07/31 0701 - 08/01 0700 In: 1363.3 [I.V.:1163.3; IV Piggyback:200] Out: -  Intake/Output this shift: No intake/output data recorded.  Lab Results:  Recent Labs  04/15/16 1315  04/16/16 0530  04/16/16 1624 04/16/16 2325 04/17/16 0529  WBC 21.5*  --  20.2*  --   --   --  12.2*  HGB 12.5  < > 9.3*  < > 8.4* 8.3* 8.4*  HCT 36.9  < > 27.2*  < > 24.6* 25.3* 25.0*  PLT 455*  --  295  --   --   --  233  < > = values in this interval not displayed. BMET  Recent Labs  04/15/16 1315 04/16/16 0530 04/17/16 0529  NA 129* 135 135  K 3.3* 3.8 4.7  CL 89* 107 111  CO2 26 24 20*  GLUCOSE 433* 94 157*  BUN 35* 27* 22*  CREATININE 1.11* 0.86 0.85  CALCIUM 11.9* 9.9 8.8*   LFT  Recent Labs  04/15/16 1315  PROT 7.0  ALBUMIN 3.5  AST 21  ALT 19  ALKPHOS 60  BILITOT 1.3*   PT/INR  Recent Labs  04/15/16 1315  LABPROT 14.5  INR 1.13     Studies/Results: Ct Head Wo Contrast  Result Date: 04/15/2016 CLINICAL DATA:  Unwitnessed fall. Fell wall trying again out of bed. Trauma to head without loss of consciousness. Initial encounter. EXAM: CT HEAD WITHOUT CONTRAST CT CERVICAL SPINE WITHOUT CONTRAST TECHNIQUE: Multidetector CT imaging of the head and cervical spine was performed following the standard protocol without intravenous contrast. Multiplanar CT image reconstructions of the cervical spine were also generated. COMPARISON:  CT head without contrast 03/29/2016. CT of the head and face 12/24/2015. FINDINGS: CT HEAD FINDINGS Moderate atrophy and periventricular white matter hypoattenuation is stable. This is slightly advanced for age. The ventricles proportionate to the degree of atrophy. A remote infarct of the right cerebellum is stable. No acute infarct, hemorrhage, or mass lesion is present. No significant extraaxial fluid collection is present. Minimal soft tissue swelling is present in the left occipital sub scalp without an underlying fracture. The calvarium is intact. A remote right inferior orbital floor fracture has healed. The paranasal sinuses and mastoid air cells are clear. CT CERVICAL SPINE FINDINGS The cervical spine is imaged from the skullbase through T2-3. Asymmetric degenerative changes are present at the left atlantooccipital joint. Degenerative changes are present at C1-2. There is chronic  loss of disc height from C3 through C7. Vertebral body heights and alignment are maintained. Facet hypertrophy is worse on the right. Soft tissues the neck demonstrate thyroid atrophy. Atherosclerotic calcifications are present at the carotid bifurcations bilaterally without significant stenoses. A large right pleural effusion is present. The lung apices are otherwise clear. IMPRESSION: 1. Stable moderate atrophy and white matter disease. No acute intracranial abnormality. 2. Minimal soft tissue swelling in the left occipital scalp  may be related to the acute trauma. There is no underlying fracture. The 3. Remote right inferior orbital floor fracture has healed. 4. Multilevel degenerative changes in the cervical spine. 5. New moderate-sized right pleural effusion. Recommend two-view chest x-ray for further evaluation. Electronically Signed   By: San Morelle M.D.   On: 04/15/2016 14:40  Ct Cervical Spine Wo Contrast  Result Date: 04/15/2016 CLINICAL DATA:  Unwitnessed fall. Fell wall trying again out of bed. Trauma to head without loss of consciousness. Initial encounter. EXAM: CT HEAD WITHOUT CONTRAST CT CERVICAL SPINE WITHOUT CONTRAST TECHNIQUE: Multidetector CT imaging of the head and cervical spine was performed following the standard protocol without intravenous contrast. Multiplanar CT image reconstructions of the cervical spine were also generated. COMPARISON:  CT head without contrast 03/29/2016. CT of the head and face 12/24/2015. FINDINGS: CT HEAD FINDINGS Moderate atrophy and periventricular white matter hypoattenuation is stable. This is slightly advanced for age. The ventricles proportionate to the degree of atrophy. A remote infarct of the right cerebellum is stable. No acute infarct, hemorrhage, or mass lesion is present. No significant extraaxial fluid collection is present. Minimal soft tissue swelling is present in the left occipital sub scalp without an underlying fracture. The calvarium is intact. A remote right inferior orbital floor fracture has healed. The paranasal sinuses and mastoid air cells are clear. CT CERVICAL SPINE FINDINGS The cervical spine is imaged from the skullbase through T2-3. Asymmetric degenerative changes are present at the left atlantooccipital joint. Degenerative changes are present at C1-2. There is chronic loss of disc height from C3 through C7. Vertebral body heights and alignment are maintained. Facet hypertrophy is worse on the right. Soft tissues the neck demonstrate thyroid  atrophy. Atherosclerotic calcifications are present at the carotid bifurcations bilaterally without significant stenoses. A large right pleural effusion is present. The lung apices are otherwise clear. IMPRESSION: 1. Stable moderate atrophy and white matter disease. No acute intracranial abnormality. 2. Minimal soft tissue swelling in the left occipital scalp may be related to the acute trauma. There is no underlying fracture. The 3. Remote right inferior orbital floor fracture has healed. 4. Multilevel degenerative changes in the cervical spine. 5. New moderate-sized right pleural effusion. Recommend two-view chest x-ray for further evaluation. Electronically Signed   By: San Morelle M.D.   On: 04/15/2016 14:40  Dg Chest Port 1 View  Result Date: 04/16/2016 CLINICAL DATA:  Increased cough and congestion this morning. EXAM: PORTABLE CHEST 1 VIEW COMPARISON:  Chest x-rays dated 04/15/2016 and 03/29/2016. FINDINGS: Today's exam is slightly hypoinspiratory with crowding of the perihilar bronchovascular markings. New patchy airspace opacities are seen at the right lung base. Left lung remains clear. Cardiomediastinal silhouette is stable. Atherosclerotic changes noted at the aortic arch. Osseous structures about the chest are unremarkable. IMPRESSION: Low lung volumes. New patchy airspace opacities at the right lung base, atelectasis versus early developing pneumonia. Aortic atherosclerosis. Electronically Signed   By: Franki Cabot M.D.   On: 04/16/2016 08:16  Dg Chest Port 1 View  Result Date: 04/15/2016  CLINICAL DATA:  Unwitnessed fall today.  Pleural effusion. EXAM: PORTABLE CHEST 1 VIEW COMPARISON:  03/29/2016 FINDINGS: Heart is borderline in size. Mild vascular congestion. Bibasilar atelectasis. No visible effusions or pneumothorax. No acute bony abnormality. IMPRESSION: Borderline heart size with vascular congestion. Bibasilar atelectasis. Electronically Signed   By: Rolm Baptise M.D.   On:  04/15/2016 15:19  Dg Abd Portable 1v  Result Date: 04/16/2016 CLINICAL DATA:  Melena. Ongoing heartburn. Pelvic pain since this morning. EXAM: PORTABLE ABDOMEN - 1 VIEW COMPARISON:  None FINDINGS: Moderate stool burden throughout the colon. Nonobstructive bowel gas pattern. No free air, organomegaly or suspicious calcification. Degenerative changes in the lumbar spine and hips. No acute bony abnormality. IMPRESSION: Moderate stool burden.  No acute findings. Electronically Signed   By: Rolm Baptise M.D.   On: 04/16/2016 09:30      Assessment / Plan:    #1 81 yo female admitted after fall at home with altered mental status- found to have UTI, anemia with dark heme + stool and Right Pneumonia dx yesterday She is stable- o2 sats fine, WBC trending down- lungs sound clearer today On  Unasyn hgb has continued slow drift. Was dehydrated on admit so was  hemoconcentrated-   Plan is for EGD this afternoon.  Continue BID PPI IV Follow serial hgbs- transfuse if drops below 8  Principal Problem:   Melena Active Problems:   DM2 (diabetes mellitus, type 2) (HCC)   GERD without esophagitis   Fall at home     LOS: 2 days   Atalya Dano  04/17/2016, 9:18 AM

## 2016-04-17 NOTE — Progress Notes (Signed)
Patient ID: SHAHIDAH HANIF, female   DOB: May 08, 1935, 81 y.o.   MRN: [id number]    Progress Note   Subjective   Sleeping but arouses easily-  Denies SOB. Denies abdominal pain, nausea/vomiting. Tech reports no stools overnight hgb 8.4 this am Wbc down to 12   Objective   Vital signs in last 24 hours: Temp:  [97.7 F (36.5 C)-98.2 F (36.8 C)] 98.1 F (36.7 C) (08/01 0522) Pulse Rate:  [82-94] 82 (08/01 0522) Resp:  [17-18] 17 (08/01 0522) BP: (90-117)/(44-71) 116/46 (08/01 0522) SpO2:  [94 %-95 %] 95 % (08/01 0522) Weight:  [164 lb 14.5 oz (74.8 kg)] 164 lb 14.5 oz (74.8 kg) (08/01 0522) Last BM Date: 04/16/16 General: elderly pale    white female in NAD Heart:  Regular rate and rhythm; no murmurs Lungs: Respirations even and unlabored, lungs CTA bilaterally anterior Abdomen:  Soft, large  Mildly tenderLLQ/LMQ and nondistended. Normal bowel sounds. Extremities:  Without edema. Neurologic:  Alert and x2  grossly normal neurologically. Psych:  Cooperative. Normal mood and affect.  Intake/Output from previous day: 07/31 0701 - 08/01 0700 In: 1363.3 [I.V.:1163.3; IV Piggyback:200] Out: -  Intake/Output this shift: No intake/output data recorded.  Lab Results:  Recent Labs  04/15/16 1315  04/16/16 0530  04/16/16 1624 04/16/16 2325 04/17/16 0529  WBC 21.5*  --  20.2*  --   --   --  12.2*  HGB 12.5  < > 9.3*  < > 8.4* 8.3* 8.4*  HCT 36.9  < > 27.2*  < > 24.6* 25.3* 25.0*  PLT 455*  --  295  --   --   --  233  < > = values in this interval not displayed. BMET  Recent Labs  04/15/16 1315 04/16/16 0530 04/17/16 0529  NA 129* 135 135  K 3.3* 3.8 4.7  CL 89* 107 111  CO2 26 24 20*  GLUCOSE 433* 94 157*  BUN 35* 27* 22*  CREATININE 1.11* 0.86 0.85  CALCIUM 11.9* 9.9 8.8*   LFT  Recent Labs  04/15/16 1315  PROT 7.0  ALBUMIN 3.5  AST 21  ALT 19  ALKPHOS 60  BILITOT 1.3*   PT/INR  Recent Labs  04/15/16 1315  LABPROT 14.5  INR 1.13     Studies/Results: Ct Head Wo Contrast  Result Date: 04/15/2016 CLINICAL DATA:  Unwitnessed fall. Fell wall trying again out of bed. Trauma to head without loss of consciousness. Initial encounter. EXAM: CT HEAD WITHOUT CONTRAST CT CERVICAL SPINE WITHOUT CONTRAST TECHNIQUE: Multidetector CT imaging of the head and cervical spine was performed following the standard protocol without intravenous contrast. Multiplanar CT image reconstructions of the cervical spine were also generated. COMPARISON:  CT head without contrast 03/29/2016. CT of the head and face 12/24/2015. FINDINGS: CT HEAD FINDINGS Moderate atrophy and periventricular white matter hypoattenuation is stable. This is slightly advanced for age. The ventricles proportionate to the degree of atrophy. A remote infarct of the right cerebellum is stable. No acute infarct, hemorrhage, or mass lesion is present. No significant extraaxial fluid collection is present. Minimal soft tissue swelling is present in the left occipital sub scalp without an underlying fracture. The calvarium is intact. A remote right inferior orbital floor fracture has healed. The paranasal sinuses and mastoid air cells are clear. CT CERVICAL SPINE FINDINGS The cervical spine is imaged from the skullbase through T2-3. Asymmetric degenerative changes are present at the left atlantooccipital joint. Degenerative changes are present at C1-2. There is chronic  loss of disc height from C3 through C7. Vertebral body heights and alignment are maintained. Facet hypertrophy is worse on the right. Soft tissues the neck demonstrate thyroid atrophy. Atherosclerotic calcifications are present at the carotid bifurcations bilaterally without significant stenoses. A large right pleural effusion is present. The lung apices are otherwise clear. IMPRESSION: 1. Stable moderate atrophy and white matter disease. No acute intracranial abnormality. 2. Minimal soft tissue swelling in the left occipital scalp  may be related to the acute trauma. There is no underlying fracture. The 3. Remote right inferior orbital floor fracture has healed. 4. Multilevel degenerative changes in the cervical spine. 5. New moderate-sized right pleural effusion. Recommend two-view chest x-ray for further evaluation. Electronically Signed   By: San Morelle M.D.   On: 04/15/2016 14:40  Ct Cervical Spine Wo Contrast  Result Date: 04/15/2016 CLINICAL DATA:  Unwitnessed fall. Fell wall trying again out of bed. Trauma to head without loss of consciousness. Initial encounter. EXAM: CT HEAD WITHOUT CONTRAST CT CERVICAL SPINE WITHOUT CONTRAST TECHNIQUE: Multidetector CT imaging of the head and cervical spine was performed following the standard protocol without intravenous contrast. Multiplanar CT image reconstructions of the cervical spine were also generated. COMPARISON:  CT head without contrast 03/29/2016. CT of the head and face 12/24/2015. FINDINGS: CT HEAD FINDINGS Moderate atrophy and periventricular white matter hypoattenuation is stable. This is slightly advanced for age. The ventricles proportionate to the degree of atrophy. A remote infarct of the right cerebellum is stable. No acute infarct, hemorrhage, or mass lesion is present. No significant extraaxial fluid collection is present. Minimal soft tissue swelling is present in the left occipital sub scalp without an underlying fracture. The calvarium is intact. A remote right inferior orbital floor fracture has healed. The paranasal sinuses and mastoid air cells are clear. CT CERVICAL SPINE FINDINGS The cervical spine is imaged from the skullbase through T2-3. Asymmetric degenerative changes are present at the left atlantooccipital joint. Degenerative changes are present at C1-2. There is chronic loss of disc height from C3 through C7. Vertebral body heights and alignment are maintained. Facet hypertrophy is worse on the right. Soft tissues the neck demonstrate thyroid  atrophy. Atherosclerotic calcifications are present at the carotid bifurcations bilaterally without significant stenoses. A large right pleural effusion is present. The lung apices are otherwise clear. IMPRESSION: 1. Stable moderate atrophy and white matter disease. No acute intracranial abnormality. 2. Minimal soft tissue swelling in the left occipital scalp may be related to the acute trauma. There is no underlying fracture. The 3. Remote right inferior orbital floor fracture has healed. 4. Multilevel degenerative changes in the cervical spine. 5. New moderate-sized right pleural effusion. Recommend two-view chest x-ray for further evaluation. Electronically Signed   By: San Morelle M.D.   On: 04/15/2016 14:40  Dg Chest Port 1 View  Result Date: 04/16/2016 CLINICAL DATA:  Increased cough and congestion this morning. EXAM: PORTABLE CHEST 1 VIEW COMPARISON:  Chest x-rays dated 04/15/2016 and 03/29/2016. FINDINGS: Today's exam is slightly hypoinspiratory with crowding of the perihilar bronchovascular markings. New patchy airspace opacities are seen at the right lung base. Left lung remains clear. Cardiomediastinal silhouette is stable. Atherosclerotic changes noted at the aortic arch. Osseous structures about the chest are unremarkable. IMPRESSION: Low lung volumes. New patchy airspace opacities at the right lung base, atelectasis versus early developing pneumonia. Aortic atherosclerosis. Electronically Signed   By: Franki Cabot M.D.   On: 04/16/2016 08:16  Dg Chest Port 1 View  Result Date: 04/15/2016  CLINICAL DATA:  Unwitnessed fall today.  Pleural effusion. EXAM: PORTABLE CHEST 1 VIEW COMPARISON:  03/29/2016 FINDINGS: Heart is borderline in size. Mild vascular congestion. Bibasilar atelectasis. No visible effusions or pneumothorax. No acute bony abnormality. IMPRESSION: Borderline heart size with vascular congestion. Bibasilar atelectasis. Electronically Signed   By: Rolm Baptise M.D.   On:  04/15/2016 15:19  Dg Abd Portable 1v  Result Date: 04/16/2016 CLINICAL DATA:  Melena. Ongoing heartburn. Pelvic pain since this morning. EXAM: PORTABLE ABDOMEN - 1 VIEW COMPARISON:  None FINDINGS: Moderate stool burden throughout the colon. Nonobstructive bowel gas pattern. No free air, organomegaly or suspicious calcification. Degenerative changes in the lumbar spine and hips. No acute bony abnormality. IMPRESSION: Moderate stool burden.  No acute findings. Electronically Signed   By: Rolm Baptise M.D.   On: 04/16/2016 09:30      Assessment / Plan:    #1 81 yo female admitted after fall at home with altered mental status- found to have UTI, anemia with dark heme + stool and Right Pneumonia dx yesterday She is stable- o2 sats fine, WBC trending down- lungs sound clearer today On  Unasyn hgb has continued slow drift. Was dehydrated on admit so was  hemoconcentrated-   Plan is for EGD this afternoon.  Continue BID PPI IV Follow serial hgbs- transfuse if drops below 8  Principal Problem:   Melena Active Problems:   DM2 (diabetes mellitus, type 2) (HCC)   GERD without esophagitis   Fall at home     LOS: 2 days   Amy Esterwood  04/17/2016, 9:18 AM

## 2016-04-17 NOTE — Clinical Social Work Note (Addendum)
Clinical Social Work Assessment  Patient Details  Name: [name] [name] MRN: [id number] Date of Birth: 11/14/1934  Date of referral:  04/17/16               Reason for consult:  Facility Placement                Permission sought to share information with:  Case Manager, Facility Contact Representative, Family Supports Permission granted to share information::  Yes, Verbal Permission Granted  Name::     SNF  Agency::     Relationship::  Daughter: Serena Haga  Contact Information:  [phone]  Housing/Transportation Living arrangements for the past 2 months:  Single Family Home Source of Information:  Patient, Adult Children Patient Interpreter Needed:  None Criminal Activity/Legal Involvement Pertinent to Current Situation/Hospitalization:  No - Comment as needed Significant Relationships:  Adult Children Lives with:  Self Do you feel safe going back to the place where you live?  No Need for family participation in patient care:  Yes (Comment)  Care giving concerns: Patient and daughter are concerned with patient frequent falls and she lives alone. Daughter states she found patient the next night after she got work. Patient had fallen from her bed and unable to get up.  Daughter stated patient has been having problems with her stomach burning, throwing up, and not eating ae well.    Social Worker assessment / plan:  LCSWA met with patient at bedside and explained reason for consult.  RN at bedside as well. Patient reports she has fallen frequently at home. She has requests to go to SNF for rehab. Patient daughter expressed concern with ED CSW, about patient inability to care for herself at home. Patient has frequent falls, especially this year. Patient can perform her  ADL's. And walks with a walker, pt. needs assistance with walking and overall care.   Employment status:  Retired Insurance information:  Managed Medicare PT Recommendations:  Skilled Nursing  Facility Information / Referral to community resources:  Skilled Nursing Facility  Patient/Family's Response to care: Patient and family are agreeable to SNF at this time.   Patient/Family's Understanding of and Emotional Response to Diagnosis, Current Treatment, and Prognosis:  Patient daughter very involved in patient care and main support. Both are agreeable to SNF.  Emotional Assessment Appearance:  Appears older than stated age Attitude/Demeanor/Rapport:  Lethargic Affect (typically observed):  Accepting, Calm Orientation:  Oriented to Self Alcohol / Substance use:  Not Applicable Psych involvement (Current and /or in the community):  No (Comment)  Discharge Needs  Concerns to be addressed:  Care Coordination Readmission within the last 30 days:  No Current discharge risk:  Lives alone, Dependent with Mobility Barriers to Discharge:  Continued Medical Work up    A , LCSW 04/17/2016, 11:53 AM  

## 2016-04-17 NOTE — Op Note (Signed)
[facility] Patient Name: [name] [name] Procedure Date: 04/17/2016 MRN: [id number] Attending MD: Carlota Raspberry. Havery Moros , MD Date of Birth: 1934/11/29 CSN: [id number] Age: 81 Admit Type: Inpatient Procedure:                Upper GI endoscopy Indications:              Acute post hemorrhagic anemia, Melena Providers:                Carlota Raspberry. Havery Moros, MD, Cleda Daub, RN,                            William Dalton, Technician Referring MD:              Medicines:                Monitored Anesthesia Care Complications:            No immediate complications. Estimated blood loss:                            Minimal. Estimated Blood Loss:     Estimated blood loss was minimal. Procedure:                Pre-Anesthesia Assessment:                           - Prior to the procedure, a History and Physical                            was performed, and patient medications and                            allergies were reviewed. The patient's tolerance of                            previous anesthesia was also reviewed. The risks                            and benefits of the procedure and the sedation                            options and risks were discussed with the patient.                            All questions were answered, and informed consent                            was obtained. Prior Anticoagulants: The patient has                            taken no previous anticoagulant or antiplatelet                            agents. ASA Grade Assessment: III - A patient with  severe systemic disease. After reviewing the risks                            and benefits, the patient was deemed in                            satisfactory condition to undergo the procedure.                           After obtaining informed consent, the endoscope was                            passed under direct vision. Throughout the   procedure, the patient's blood pressure, pulse, and                            oxygen saturations were monitored continuously. The                            [id number]) scope was introduced through the                            mouth, and advanced to the second part of duodenum.                            The upper GI endoscopy was accomplished without                            difficulty. The patient tolerated the procedure                            well. Scope In: Scope Out: Findings:      Esophagogastric landmarks were identified: the Z-line was found at 35       cm, the gastroesophageal junction was found at 35 cm and the upper       extent of the gastric folds was found at 39 cm from the incisors.      A 4 cm hiatal hernia was present.      Diffuse severe mucosal changes were found in the entire esophagus,       concerning for diffuse ischemic / necrosis changes, concerning for acute       esophageal necrosis. One small biopsy was taken with a cold forceps for       histology. The entire esophagus was equally afflicted with mild sparing       of the most proximal esophagus. The GEJ had nodularity which was biopsied      Retained fluid was found in the gastric fundus and in the gastric body.      A suspected mass with no bleeding and no stigmata of recent bleeding was       found at the pylorus causing a partial outlet obstruction. The lumen was       extremely narrowed and the duodenal scope was able to traverse it with       some mild resistance. Biopsies were taken with a cold forceps for       histology of the suspected mass which  appeared to infiltrate the       duodenal bulb.      The exam of the stomach was otherwise normal.      Biopsies were taken with a cold forceps in the gastric body and in the       gastric antrum for Helicobacter pylori testing.      Many non-bleeding superficial duodenal ulcers were found in the second       portion of the duodenum.  Biopsies were taken with a cold forceps for       histology.      The exam of the duodenum was otherwise normal. Impression:               - Esophagogastric landmarks identified.                           - 4 cm hiatal hernia.                           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                            Biopsied.                           - Multiple non-bleeding duodenal ulcers. Biopsied.                           - Biopsies were taken with a cold forceps for                            Helicobacter pylori testing.                           Overall, findings are concerning for acute                            esophageal necrosis in the setting of suspected                            partial gastric outlet obstruction due to gastric  mass, associated with duodenal ulcerations. Moderate Sedation:      No moderate sedation, case performed with MAC Recommendation:           - Return patient to ward for ongoing care,                            recommend transfer to ICU or stepdown unit.                           - NPO.                           - Continue present medications, broad spectrum                            antibiotics                           - Continue IV PPI                           - Imaging with CT of chest / abdomen / pelvis with                            IV contrast                           - GI service will continue to follow with further                            recommendations. Procedure Code(s):        --- Professional ---                           [phone], Esophagogastroduodenoscopy, flexible,                             transoral; with biopsy, single or multiple Diagnosis Code(s):        --- Professional ---                           K44.9, Diaphragmatic hernia without obstruction or                            gangrene                           K22.8, Other specified diseases of esophagus                           D49.0, Neoplasm of unspecified behavior of                            digestive system                           K26.9, Duodenal ulcer, unspecified as acute or  chronic, without hemorrhage or perforation                           D62, Acute posthemorrhagic anemia                           K92.1, Melena (includes Hematochezia) CPT copyright 2016 American [employer] Association. All rights reserved. The codes documented in this report are preliminary and upon coder review may  be revised to meet current compliance requirements. Remo Lipps P. Kameka Whan, MD 04/17/2016 3:27:33 PM This report has been signed electronically. Number of Addenda: 0

## 2016-04-17 NOTE — Progress Notes (Signed)
Pt dont keep the Tele monitor on due to confusion. Can bee d/cd, MD aware.

## 2016-04-17 NOTE — NC FL2 (Signed)
[place] LEVEL OF CARE SCREENING TOOL     IDENTIFICATION  Patient Name: [name] [name] Birthdate: August 15, 1935 Sex: female Admission Date (Current Location): 04/15/2016  [facility] and [place] Number:  Herbalist and Address:  [facility],  [place], [place]      Provider Number: [id number]  Attending Physician Name and Address:  Thurnell Lose, MD  Relative Name and Phone Number:   Daughter: Reinaldo Meeker [phone]    Current Level of Care: Hospital Recommended Level of Care: [place] Prior Approval Number:    Date Approved/Denied:   PASRR Number:    Discharge Plan: SNF    Current Diagnoses: Patient Active Problem List   Diagnosis Date Noted  . Melena 04/15/2016  . GERD without esophagitis 04/15/2016  . Fall at home 04/15/2016  . Intramural leiomyoma of uterus 08/25/2015  . Urinary incontinence 06/02/2013  . Nocturia 06/02/2013  . Rectocele 06/02/2013  . Vaginal atrophy 06/02/2013  . Sebaceous cyst of labia 05/01/2013  . DM2 (diabetes mellitus, type 2) ([place])   . Reflux   . Sleep apnea     Orientation RESPIRATION BLADDER Height & Weight     Self  Normal Continent Weight: 164 lb 14.5 oz (74.8 kg) Height:  5\' 2"  (157.5 cm)  BEHAVIORAL SYMPTOMS/MOOD NEUROLOGICAL BOWEL NUTRITION STATUS      Incontinent Diet (See Discharge Summary for recommedations)  AMBULATORY STATUS COMMUNICATION OF NEEDS Skin   Limited Assist Verbally Normal                       Personal Care Assistance Level of Assistance  Bathing, Feeding, Dressing Bathing Assistance: Limited assistance Feeding assistance: Independent Dressing Assistance: Limited assistance     Functional Limitations Info  Sight, Hearing, Speech Sight Info: Adequate Hearing Info: Adequate Speech Info: Adequate    SPECIAL CARE FACTORS FREQUENCY  PT (By licensed PT)     PT Frequency: 5              Contractures Contractures  Info: Not present    Additional Factors Info  Code Status, Allergies, Insulin Sliding Scale Code Status Info: Full Allergies Info: Ace Inhibitors, Prednisone, Sitagliptin   Insulin Sliding Scale Info: insulin aspart (novoLOG) injection 0-9 Units, insulin aspart (novoLOG) injection 14 Units       Current Medications (04/17/2016):  This is the current hospital active medication list Current Facility-Administered Medications  Medication Dose Route Frequency Provider Last Rate Last Dose  . 0.9 % NaCl with KCl 40 mEq / L  infusion   Intravenous Continuous Rhetta Mura Schorr, NP 100 mL/hr at 04/17/16 0122 100 mL/hr at 04/17/16 0122  . albuterol (PROVENTIL) (2.5 MG/3ML) 0.083% nebulizer solution 2.5 mg  2.5 mg Nebulization Q6H PRN Thurnell Lose, MD   2.5 mg at 04/16/16 0821  . ampicillin-sulbactam (UNASYN) 1.5 g in sodium chloride 0.9 % 50 mL IVPB  1.5 g Intravenous Q6H Thurnell Lose, MD   1.5 g at 04/17/16 0846  . bisacodyl (DULCOLAX) suppository 10 mg  10 mg Rectal Daily Thurnell Lose, MD   10 mg at 04/16/16 1123  . docusate sodium (COLACE) capsule 200 mg  200 mg Oral BID Thurnell Lose, MD   200 mg at 04/16/16 1147  . erythromycin ophthalmic ointment   Both Eyes QHS Thurnell Lose, MD      . feeding supplement (BOOST / RESOURCE BREEZE) liquid 1 Container  1 Container Oral  TID BM Thurnell Lose, MD   1 Container at 04/16/16 2227  . hydrALAZINE (APRESOLINE) injection 10 mg  10 mg Intravenous Q6H PRN Thurnell Lose, MD      . HYDROcodone-acetaminophen (NORCO/VICODIN) 5-325 MG per tablet 1 tablet  1 tablet Oral Q4H PRN Thurnell Lose, MD      . insulin aspart (novoLOG) injection 0-9 Units  0-9 Units Subcutaneous Q6H Thurnell Lose, MD   2 Units at 04/17/16 [phone]  . insulin aspart (novoLOG) injection 14 Units  14 Units Subcutaneous Once Thurnell Lose, MD      . ondansetron [facility]) tablet 4 mg  4 mg Oral Q6H PRN Thurnell Lose, MD       Or  . ondansetron (ZOFRAN) injection 4  mg  4 mg Intravenous Q6H PRN Thurnell Lose, MD      . pantoprazole (PROTONIX) 80 mg in sodium chloride 0.9 % 250 mL (0.32 mg/mL) infusion  8 mg/hr Intravenous Continuous Thurnell Lose, MD 25 mL/hr at 04/17/16 0122 8 mg/hr at 04/17/16 0122  . [START ON 04/19/2016] pantoprazole (PROTONIX) injection 40 mg  40 mg Intravenous Q12H Thurnell Lose, MD      . sodium chloride flush (NS) 0.9 % injection 3 mL  3 mL Intravenous Q12H Thurnell Lose, MD      . sucralfate (CARAFATE) tablet 1 g  1 g Oral QID PRN Thurnell Lose, MD         Discharge Medications: Please see discharge summary for a list of discharge medications.  Relevant Imaging Results:  Relevant Lab Results:   Additional Information ss# [id number]  Lia Hopping, LCSW

## 2016-04-17 NOTE — Interval H&P Note (Signed)
History and Physical Interval Note:  04/17/2016 2:27 PM  [name] [name]  has presented today for surgery, with the diagnosis of melena  The various methods of treatment have been discussed with the patient and family. After consideration of risks, benefits and other options for treatment, the patient has consented to  Procedure(s): ESOPHAGOGASTRODUODENOSCOPY (EGD) WITH PROPOFOL (N/A) as a surgical intervention .  The patient's history has been reviewed, patient examined, no change in status, stable for surgery.  I have reviewed the patient's chart and labs.  Questions were answered to the patient's satisfaction.     [name]

## 2016-04-17 NOTE — Anesthesia Postprocedure Evaluation (Signed)
Anesthesia Post Note  Patient: [name] [name]  Procedure(s) Performed: Procedure(s) (LRB): ESOPHAGOGASTRODUODENOSCOPY (EGD) WITH PROPOFOL (N/A)  Patient location during evaluation: Endoscopy Anesthesia Type: MAC Level of consciousness: awake and alert Pain management: pain level controlled Vital Signs Assessment: post-procedure vital signs reviewed and stable Respiratory status: spontaneous breathing, nonlabored ventilation, respiratory function stable and patient connected to nasal cannula oxygen Cardiovascular status: stable and blood pressure returned to baseline Anesthetic complications: no    Last Vitals:  Vitals:   04/17/16 1540 04/17/16 1550  BP: (!) 116/45 (!) 101/37  Pulse: 72 74  Resp: 18 16  Temp:      Last Pain:  Vitals:   04/17/16 1347  TempSrc: Oral  PainSc:                  Montez Hageman

## 2016-04-17 NOTE — Anesthesia Preprocedure Evaluation (Addendum)
Anesthesia Evaluation  Patient identified by MRN, date of birth, ID band Patient awake and Patient confused    Reviewed: Allergy & Precautions, NPO status , Patient's Chart, lab work & pertinent test results, Unable to perform ROS - Chart review only  Airway Mallampati: II  TM Distance: >3 FB Neck ROM: Full    Dental no notable dental hx.    Pulmonary neg pulmonary ROS,    Pulmonary exam normal breath sounds clear to auscultation       Cardiovascular hypertension, Pt. on medications Normal cardiovascular exam Rhythm:Regular Rate:Normal     Neuro/Psych negative neurological ROS  negative psych ROS   GI/Hepatic Neg liver ROS, hiatal hernia, GERD  ,  Endo/Other  negative endocrine ROSdiabetes, Type 2  Renal/GU negative Renal ROS  negative genitourinary   Musculoskeletal negative musculoskeletal ROS (+)   Abdominal   Peds negative pediatric ROS (+)  Hematology  (+) anemia ,   Anesthesia Other Findings   Reproductive/Obstetrics negative OB ROS                            Anesthesia Physical Anesthesia Plan  ASA: III  Anesthesia Plan: MAC   Post-op Pain Management:    Induction:   Airway Management Planned: Nasal Cannula  Additional Equipment:   Intra-op Plan:   Post-operative Plan:   Informed Consent: I have reviewed the patients History and Physical, chart, labs and discussed the procedure including the risks, benefits and alternatives for the proposed anesthesia with the patient or authorized representative who has indicated his/her understanding and acceptance.   Dental advisory given  Plan Discussed with: CRNA  Anesthesia Plan Comments:         Anesthesia Quick Evaluation

## 2016-04-17 NOTE — Transfer of Care (Signed)
Immediate Anesthesia Transfer of Care Note  Patient: [name] [name]  Procedure(s) Performed: Procedure(s): ESOPHAGOGASTRODUODENOSCOPY (EGD) WITH PROPOFOL (N/A)  Patient Location: PACU  Anesthesia Type:MAC  Level of Consciousness:  sedated, patient cooperative and responds to stimulation  Airway & Oxygen Therapy:Patient Spontanous Breathing and Patient connected to face mask oxgen  Post-op Assessment:  Report given to PACU RN and Post -op Vital signs reviewed and stable  Post vital signs:  Reviewed and stable  Last Vitals:  Vitals:   04/17/16 0522 04/17/16 1347  BP: (!) 116/46 (!) 119/47  Pulse: 82 83  Resp: 17 19  Temp: 36.7 C [id number] C    Complications: No apparent anesthesia complications

## 2016-04-18 ENCOUNTER — Encounter (HOSPITAL_COMMUNITY): Payer: Self-pay | Admitting: Gastroenterology

## 2016-04-18 LAB — GLUCOSE, CAPILLARY
GLUCOSE-CAPILLARY: 153 mg/dL — AB (ref 65–99)
GLUCOSE-CAPILLARY: 171 mg/dL — AB (ref 65–99)
Glucose-Capillary: 209 mg/dL — ABNORMAL HIGH (ref 65–99)
Glucose-Capillary: 198 mg/dL — ABNORMAL HIGH (ref 65–99)

## 2016-04-18 LAB — BASIC METABOLIC PANEL
Anion gap: 4 — ABNORMAL LOW (ref 5–15)
BUN: 13 mg/dL (ref 6–20)
CO2: 21 mmol/L — ABNORMAL LOW (ref 22–32)
Calcium: 8.1 mg/dL — ABNORMAL LOW (ref 8.9–10.3)
Chloride: 109 mmol/L (ref 101–111)
Creatinine, Ser: 0.86 mg/dL (ref 0.44–1.00)
GFR calc Af Amer: >60 mL/min (ref >60)
GFR calc non Af Amer: >60 mL/min (ref >60)
Glucose, Bld: 231 mg/dL — ABNORMAL HIGH (ref 65–99)
Potassium: 4.2 mmol/L (ref 3.5–5.1)
Sodium: 134 mmol/L — ABNORMAL LOW (ref 135–145)

## 2016-04-18 LAB — PHOSPHORUS: PHOSPHORUS: 1.3 mg/dL — AB (ref 2.5–4.6)

## 2016-04-18 LAB — CBC
HEMATOCRIT: 25.3 % — AB (ref 36.0–46.0)
HEMOGLOBIN: 8.5 g/dL — AB (ref 12.0–15.0)
MCH: 29.6 pg (ref 26.0–34.0)
MCHC: 33.6 g/dL (ref 30.0–36.0)
MCV: 88.2 fL (ref 78.0–100.0)
Platelets: 247 10*3/uL (ref 150–400)
RBC: 2.87 MIL/uL — ABNORMAL LOW (ref 3.87–5.11)
RDW: 17 % — ABNORMAL HIGH (ref 11.5–15.5)
WBC: 8.3 10*3/uL (ref 4.0–10.5)

## 2016-04-18 LAB — MAGNESIUM: Magnesium: 1.3 mg/dL — ABNORMAL LOW (ref 1.7–2.4)

## 2016-04-18 MED ORDER — KCL IN DEXTROSE-NACL 20-5-0.45 MEQ/L-%-% IV SOLN: INTRAVENOUS | Status: DC

## 2016-04-18 MED ORDER — SODIUM CHLORIDE 0.9% FLUSH
10.0000 mL | INTRAVENOUS | Status: DC | PRN
  Administered 2016-04-20: 30 mL
  Administered 2016-04-21 – 2016-04-24 (×6): 10 mL
  Filled 2016-04-18 (×7): qty 40

## 2016-04-18 MED ORDER — SODIUM CHLORIDE 0.9% FLUSH
10.0000 mL | Freq: Two times a day (BID) | INTRAVENOUS | Status: DC
  Administered 2016-04-18: 10 mL
  Administered 2016-04-19: 20 mL
  Administered 2016-04-26: 10 mL

## 2016-04-18 MED ORDER — FAT EMULSION 20 % IV EMUL
192.0000 mL | INTRAVENOUS | Status: AC
  Administered 2016-04-18: 192 mL via INTRAVENOUS
  Filled 2016-04-18: qty 200

## 2016-04-18 MED ORDER — TRACE MINERALS CR-CU-MN-SE-ZN 10-1000-500-60 MCG/ML IV SOLN
INTRAVENOUS | Status: AC
  Administered 2016-04-18: 18:00:00 via INTRAVENOUS
  Filled 2016-04-18: qty 960

## 2016-04-18 NOTE — Progress Notes (Signed)
PARENTERAL NUTRITION CONSULT NOTE - INITIAL  Pharmacy Consult for TPN Indication: gastric outlet obstruction  Allergies  Allergen Reactions  . Ace Inhibitors Cough  . Prednisone     REACTION: lethargic  . Sitagliptin     Other reaction(s): Unknown    Patient Measurements: Height: 5\' 5"  (165.1 cm) Weight: 167 lb 12.3 oz (76.1 kg) IBW/kg (Calculated) : 57 Adjusted Body Weight: 72.2 kg  Vital Signs: Temp: 98.6 F (37 C) (08/02 0800) Temp Source: Axillary (08/02 0800) BP: 111/57 (08/02 1100) Pulse Rate: 81 (08/02 1100) Intake/Output from previous day: 08/01 0701 - 08/02 0700 In: 1991.7 [I.V.:1841.7; IV Piggyback:150] Out: -  Intake/Output from this shift: Total I/O In: 550 [I.V.:500; IV Piggyback:50] Out: -   Labs:  Recent Labs  04/15/16 1315  04/16/16 0530  04/16/16 1624 04/16/16 2325 04/17/16 0529  WBC 21.5*  --  20.2*  --   --   --  12.2*  HGB 12.5  < > 9.3*  < > 8.4* 8.3* 8.4*  HCT 36.9  < > 27.2*  < > 24.6* 25.3* 25.0*  PLT 455*  --  295  --   --   --  233  INR 1.13  --   --   --   --   --   --   < > = values in this interval not displayed.   Recent Labs  04/15/16 1315 04/16/16 0530 04/17/16 0529  NA 129* 135 135  K 3.3* 3.8 4.7  CL 89* 107 111  CO2 26 24 20*  GLUCOSE 433* 94 157*  BUN 35* 27* 22*  CREATININE 1.11* 0.86 0.85  CALCIUM 11.9* 9.9 8.8*  PROT 7.0  --   --   ALBUMIN 3.5  --   --   AST 21  --   --   ALT 19  --   --   ALKPHOS 60  --   --   BILITOT 1.3*  --   --    Estimated Creatinine Clearance: 55.6 mL/min (by C-G formula based on SCr of 0.85 mg/dL).    Recent Labs  04/17/16 1727 04/18/16 0000 04/18/16 0541  GLUCAP 144* 153* 171*    Medical History: Past Medical History:  Diagnosis Date  . DDD (degenerative disc disease), lumbar   . Diabetes mellitus   . Diverticulosis   . Endocervical polyp   . Endometrial polyp   . GERD (gastroesophageal reflux disease)   . Hiatal hernia   . Hx of adenomatous colonic polyps   .  Hyperlipidemia   . Hypertension   . Hyponatremia     Medications:  Scheduled:  . ampicillin-sulbactam (UNASYN) IV  1.5 g Intravenous Q6H  . bisacodyl  10 mg Rectal Daily  . erythromycin   Both Eyes QHS  . insulin aspart  0-9 Units Subcutaneous Q6H  . insulin aspart  14 Units Subcutaneous Once  . [START ON 04/19/2016] pantoprazole  40 mg Intravenous Q12H  . sodium chloride flush  3 mL Intravenous Q12H   Infusions:  . dextrose 5 % and 0.45 % NaCl with KCl 20 mEq/L 100 mL/hr at 04/18/16 0542  . pantoprozole (PROTONIX) infusion 8 mg/hr (04/18/16 1100)   PRN: albuterol, hydrALAZINE, [DISCONTINUED] ondansetron **OR** ondansetron (ZOFRAN) IV  Insulin Requirements: 6 units Novolog sensitive scale  Current Nutrition: NPO  IVF: D545 with 20KCl at 100 ml/hr  Central access: PICC to be placed 8/2 TPN start date: 8/2  ASSESSMENT  HPI: 81 y.o.female,With history of essential hypertension, diabetes mellitus type 2, dyslipidemia, peptic ulcer disease, poor balance with multiple falls.  Admitted for dark stools which were heme positive with generalized weakness. EGD 7/31 diffuse esophagitis and a black necrotic appearing esophagus, pyloric mass causing gastric outlet obstruction. Biopsies pending. May need surgery. GI suggests TPN.  Significant events:   Labs 8/1:    Glucose: near goal of less than 150 on SSI, PTA glipizide & metformin on hold  Electrolytes: corrected Ca WNL  Renal: SCr WNL  LFTs: Tbili slightly elevated  TGs: check in AM  Prealbumin: check in AM  NUTRITIONAL GOALS                                                                                             RD recs 7/31: 1250-1400 kcal, 70-85g protein per day Clinimix E 5/15 at a goal rate of 48ml/hr + 20% fat emulsion at 14ml/hr to provide: 72g/day protein, 1406Kcal/day.  PLAN                                                                                                                          At 1800 today:  Start Clinimix E 5/15 at 40 ml/hr.  20% fat emulsion at 88ml/hr.  Plan to advance as tolerated to the goal rate.  TPN to contain standard multivitamins and trace elements.  Reduce IVF to 79ml/hr.  Continue SSI sensitive scale q6h.   TPN lab panels on Mondays & Thursdays.  F/u daily.   [name], PharmD, BCPS Pager: [phone] 04/18/2016,11:27 AM

## 2016-04-18 NOTE — Progress Notes (Signed)
LCSWA met with patient at bedside. Patient reports she is feeling better today. LCSWA discussed with patient her disposition to SNF.Patient has chosen [facility] and [facility].  LCSWA spoke with patient daughter [name]. She expressed she is supportive of her mother going to SNF at this time. Spoke with SNF AC/Faxed updated therapy notes.   Kathrin Greathouse, Latanya Presser, MSW Clinical Social Worker 5E and Psychiatric Service Line [phone] 04/18/2016  1:53 PM

## 2016-04-18 NOTE — Progress Notes (Signed)
Pharmacy Antibiotic Note  TOSSIE LANTRIP is a 81 y.o. female admitted on 04/15/2016 with aspiration pneumonia.   Pharmacy has been consulted for unasyn dosing.  Plan:  Continue Unasyn 1.5gm IV q6h  No further dosing adjustments anticipated, Pharmacy will sign-off  Height: 5\' 5"  (165.1 cm) Weight: 167 lb 12.3 oz (76.1 kg) IBW/kg (Calculated) : 57  Temp (24hrs), Avg:97.9 F (36.6 C), Min:97.5 F (36.4 C), Max:98.6 F (37 C)   Recent Labs Lab 04/15/16 1315 04/16/16 0530 04/17/16 0529  WBC 21.5* 20.2* 12.2*  CREATININE 1.11* 0.86 0.85    Estimated Creatinine Clearance: 55.6 mL/min (by C-G formula based on SCr of 0.85 mg/dL).    Allergies  Allergen Reactions  . Ace Inhibitors Cough  . Prednisone     REACTION: lethargic  . Sitagliptin     Other reaction(s): Unknown    Antimicrobials this admission: 7/31 Unasyn >>  Microbiology 8/1 MRSA PCR (-)  Thank you for allowing pharmacy to be a part of this patient's care.  [name], PharmD, BCPS Pager: [phone] 04/18/2016, 10:09 AM

## 2016-04-18 NOTE — Progress Notes (Signed)
Patient ID: [name] [name], female   DOB: 09/09/1935, 81 y.o.   MRN: [id number]    Progress Note   Subjective    Told  staff this am she knew she was at the hospital but came for ice cream , and wants to go home. Denies any pain or SOB, would like something to drink   Objective   Vital signs in last 24 hours: Temp:  [97.5 F (36.4 C)-98.6 F (37 C)] 98.6 F (37 C) (08/02 0800) Pulse Rate:  [71-86] 86 (08/02 0900) Resp:  [14-23] 18 (08/02 0900) BP: (96-148)/(37-103) 122/54 (08/02 0900) SpO2:  [96 %-100 %] 100 % (08/02 0900) Weight:  [165 lb 2 oz (74.9 kg)-167 lb 12.3 oz (76.1 kg)] 167 lb 12.3 oz (76.1 kg) (08/02 0500) Last BM Date: 04/16/16 General: elderly    white female in NAD, conversant but confused Heart:  Regular rate and rhythm; no murmurs Lungs: Respirations even and unlabored, lungs CTA bilaterally Abdomen:  Soft, nontender and nondistended. Normal bowel sounds. Extremities:  edematous Neurologic:  Alert and oriented x1 ,  grossly normal neurologically. Psych:  Cooperative. Normal mood and affect.  Intake/Output from previous day: 08/01 0701 - 08/02 0700 In: 1991.7 [I.V.:1841.7; IV Piggyback:150] Out: -  Intake/Output this shift: Total I/O In: 250 [I.V.:250] Out: -   Lab Results:  Recent Labs  04/15/16 1315  04/16/16 0530  04/16/16 1624 04/16/16 2325 04/17/16 0529  WBC 21.5*  --  20.2*  --   --   --  12.2*  HGB 12.5  < > 9.3*  < > 8.4* 8.3* 8.4*  HCT 36.9  < > 27.2*  < > 24.6* 25.3* 25.0*  PLT 455*  --  295  --   --   --  233  < > = values in this interval not displayed. BMET  Recent Labs  04/15/16 1315 04/16/16 0530 04/17/16 0529  NA 129* 135 135  K 3.3* 3.8 4.7  CL 89* 107 111  CO2 26 24 20*  GLUCOSE 433* 94 157*  BUN 35* 27* 22*  CREATININE 1.11* 0.86 0.85  CALCIUM 11.9* 9.9 8.8*   LFT  Recent Labs  04/15/16 1315  PROT 7.0  ALBUMIN 3.5  AST 21  ALT 19  ALKPHOS 60  BILITOT 1.3*   PT/INR  Recent Labs  04/15/16 1315    LABPROT 14.5  INR 1.13    Studies/Results: Ct Chest W Contrast  Result Date: 04/17/2016 CLINICAL DATA:  Black esophagus on EGD today.  Rule out necrosis. EXAM: CT CHEST, ABDOMEN, AND PELVIS WITH CONTRAST TECHNIQUE: Multidetector CT imaging of the chest, abdomen and pelvis was performed following the standard protocol during bolus administration of intravenous contrast. CONTRAST:  ISOVUE-300 IOPAMIDOL (ISOVUE-300) INJECTION 61% COMPARISON:  Chest radiographs yesterday. No prior chest CT. CT abdomen/pelvis 08/04/2012 FINDINGS: CT CHEST FINDINGS Cardiovascular: Tortuosity of the thoracic aorta without aneurysm. There is moderate atherosclerosis. Conventional branching pattern from the aortic arch. No aortic dissection. Scattered coronary artery calcifications. Heart is normal in size. Mediastinum/Nodes: There is diffuse esophageal wall thickening from the thoracic inlet through the diaphragmatic hiatus. Intraluminal esophageal fluid. No evidence of pneumatosis. No pneumomediastinum. Small amount of paraesophageal fluid distally may be contiguous with right pleural effusion. Prominent right upper paratracheal node measures 10 mm short axis. Small lower paratracheal nodes not enlarged by size criteria. No evidence of hilar adenopathy. No axillary adenopathy. No pericardial effusion. Lungs/Pleura: Moderate bilateral pleural effusions, right greater than left. There is adjacent compressive atelectasis in  both lower lobes. Mild patchy opacities in the anterior right upper lobe, left upper lobe to a lesser extent and superior segment left lower lobe. There is minimal smooth septal thickening. Trachea and mainstem bronchi are patent. Musculoskeletal: There are no acute or suspicious osseous abnormalities. CT ABDOMEN PELVIS FINDINGS Hepatobiliary: No focal hepatic lesion. Streak artifact from arms down positioning. Question of nodular hepatic contours. Postcholecystectomy. No biliary dilatation. Pancreas:  Atrophic parenchyma. No ductal dilatation or inflammation. Spleen: Unremarkable. Adrenals/Urinary Tract: No adrenal nodule. Symmetric renal enhancement. No hydronephrosis. Motion artifact through the lower left kidney. Contour deformity is likely secondary to lobular contours and motion, less likely focal renal lesion. Urinary bladder is physiologically distended. Stomach/Bowel: Diffuse wall thickening and edematous appearance. Small volume of intraluminal gastric fluid. No pneumatosis. Small bowel is decompressed. No definite small bowel thickening. Extensive colonic diverticulosis of the descending and sigmoid colon without acute diverticulitis. Scattered colonic diverticular throughout the remainder the colon. Appendix tentatively identified and normal. There is mild wall thickening at the rectosigmoid junction with adjacent perirectal fat stranding. Vascular/Lymphatic: Abdominal aortic atherosclerosis. No aneurysm. Circumaortic left renal vein. No retroperitoneal adenopathy. Reproductive: Uterus is atrophic, normal for age.  No adnexal mass. Other: Small fat containing upper ventral abdominal wall hernia. Small amount of ascites adjacent to the liver in the spleen. There is no free air. No intra-abdominal abscess. There is whole body wall edema. Musculoskeletal: Degenerative change throughout spine. There are no acute or suspicious osseous abnormalities. IMPRESSION: 1. Esophagus is diffusely edematous with diffuse wall thickening and intraluminal fluid. No pneumatosis or pneumomediastinum. No CT findings to suggest necrosis. Similar edematous appearance of the stomach. 2. Moderate bilateral pleural effusions with adjacent compressive atelectasis. Minimal smooth septal thickening may reflect pulmonary edema. 3. Contour deformity of the lower left kidney likely related to lobular contours and motion, difficult to exclude exophytic lesion. Nonemergent renal ultrasound could be considered when patient is able. 4.  Rather extensive colonic diverticulosis, no diverticulitis. Rectus sigmoid colonic wall thickening with adjacent edema, suggesting chronic constipation. 5. Question of nodular hepatic contours raising concern for cirrhosis. 6. Whole body wall edema.  Minimal abdominal ascites. 7. Thoracoabdominal aortic atherosclerosis. Coronary artery calcifications. Electronically Signed   By: Jeb Levering M.D.   On: 04/17/2016 22:00   Ct Abdomen Pelvis W Contrast  Result Date: 04/17/2016 CLINICAL DATA:  Black esophagus on EGD today.  Rule out necrosis. EXAM: CT CHEST, ABDOMEN, AND PELVIS WITH CONTRAST TECHNIQUE: Multidetector CT imaging of the chest, abdomen and pelvis was performed following the standard protocol during bolus administration of intravenous contrast. CONTRAST:  131mL ISOVUE-300 IOPAMIDOL (ISOVUE-300) INJECTION 61% COMPARISON:  Chest radiographs yesterday. No prior chest CT. CT abdomen/pelvis 08/04/2012 FINDINGS: CT CHEST FINDINGS Cardiovascular: Tortuosity of the thoracic aorta without aneurysm. There is moderate atherosclerosis. Conventional branching pattern from the aortic arch. No aortic dissection. Scattered coronary artery calcifications. Heart is normal in size. Mediastinum/Nodes: There is diffuse esophageal wall thickening from the thoracic inlet through the diaphragmatic hiatus. Intraluminal esophageal fluid. No evidence of pneumatosis. No pneumomediastinum. Small amount of paraesophageal fluid distally may be contiguous with right pleural effusion. Prominent right upper paratracheal node measures 10 mm short axis. Small lower paratracheal nodes not enlarged by size criteria. No evidence of hilar adenopathy. No axillary adenopathy. No pericardial effusion. Lungs/Pleura: Moderate bilateral pleural effusions, right greater than left. There is adjacent compressive atelectasis in both lower lobes. Mild patchy opacities in the anterior right upper lobe, left upper lobe to a lesser extent and superior  segment  left lower lobe. There is minimal smooth septal thickening. Trachea and mainstem bronchi are patent. Musculoskeletal: There are no acute or suspicious osseous abnormalities. CT ABDOMEN PELVIS FINDINGS Hepatobiliary: No focal hepatic lesion. Streak artifact from arms down positioning. Question of nodular hepatic contours. Postcholecystectomy. No biliary dilatation. Pancreas: Atrophic parenchyma. No ductal dilatation or inflammation. Spleen: Unremarkable. Adrenals/Urinary Tract: No adrenal nodule. Symmetric renal enhancement. No hydronephrosis. Motion artifact through the lower left kidney. Contour deformity is likely secondary to lobular contours and motion, less likely focal renal lesion. Urinary bladder is physiologically distended. Stomach/Bowel: Diffuse wall thickening and edematous appearance. Small volume of intraluminal gastric fluid. No pneumatosis. Small bowel is decompressed. No definite small bowel thickening. Extensive colonic diverticulosis of the descending and sigmoid colon without acute diverticulitis. Scattered colonic diverticular throughout the remainder the colon. Appendix tentatively identified and normal. There is mild wall thickening at the rectosigmoid junction with adjacent perirectal fat stranding. Vascular/Lymphatic: Abdominal aortic atherosclerosis. No aneurysm. Circumaortic left renal vein. No retroperitoneal adenopathy. Reproductive: Uterus is atrophic, normal for age.  No adnexal mass. Other: Small fat containing upper ventral abdominal wall hernia. Small amount of ascites adjacent to the liver in the spleen. There is no free air. No intra-abdominal abscess. There is whole body wall edema. Musculoskeletal: Degenerative change throughout spine. There are no acute or suspicious osseous abnormalities. IMPRESSION: 1. Esophagus is diffusely edematous with diffuse wall thickening and intraluminal fluid. No pneumatosis or pneumomediastinum. No CT findings to suggest necrosis. Similar  edematous appearance of the stomach. 2. Moderate bilateral pleural effusions with adjacent compressive atelectasis. Minimal smooth septal thickening may reflect pulmonary edema. 3. Contour deformity of the lower left kidney likely related to lobular contours and motion, difficult to exclude exophytic lesion. Nonemergent renal ultrasound could be considered when patient is able. 4. Rather extensive colonic diverticulosis, no diverticulitis. Rectus sigmoid colonic wall thickening with adjacent edema, suggesting chronic constipation. 5. Question of nodular hepatic contours raising concern for cirrhosis. 6. Whole body wall edema.  Minimal abdominal ascites. 7. Thoracoabdominal aortic atherosclerosis. Coronary artery calcifications. Electronically Signed   By: Jeb Levering M.D.   On: 04/17/2016 22:00       Assessment / Plan:    #1  81 yo female admitted 7/30 after fall at home with altered mental status, and on admit has multiple issues-UTI, right sided pneumonia  Likely aspiration related, and had anemia and melena  EGD  yesterday with finding of a diffuse esophagitis and a black necrotic appearing esophagus , in addition has a pyloric mass causing gastric outlet obstruction  Ct of Chest/Abd/pelvis  Last pm without IV contrast shows diffuse esophageal wall thickening, no evidence for perforation or necrosis, also has similar appearing edema of stomach, mild bilateral effusions Diffuse body wall edema, minimal ascites,possible cirrhosis.  Plan; Npo  Please start TPN, will need PICC Continue IV Unasyn Await bx's -hopefully available today  Dr Havery Moros did speak with family last pm so they are ware of findings Ultimately she may need surgery ,obviously very high risk for surgery but has GOO Repeat labs this am- keep hgb 8 or above Continue PPI infusion    Principal Problem:   Melena Active Problems:   DM2 (diabetes mellitus, type 2) ([place])   GERD without esophagitis   Fall at home      LOS: 3 days   Atlantis Delong  04/18/2016, 9:27 AM

## 2016-04-18 NOTE — Progress Notes (Signed)
Physical Therapy Treatment Patient Details Name: [name] [name] MRN: [id number] DOB: Aug 22, 1935 Today's Date: 04/18/2016    History of Present Illness 81 yo female admitted with melena, hyperglycemia, fall, AMS. hx of essential HTN, DM, multiple falls.    PT Comments    Pt cooperative but progressing slowly with mobility 2* generalized weakness and poor endurance.  Follow Up Recommendations  SNF     Equipment Recommendations  None recommended by PT    Recommendations for Other Services OT consult     Precautions / Restrictions Precautions Precautions: Fall Restrictions Weight Bearing Restrictions: No    Mobility  Bed Mobility Overal bed mobility: Needs Assistance Bed Mobility: Supine to Sit     Supine to sit: Mod assist;HOB elevated     General bed mobility comments: Assist for trunk and bil LEs. Increased time.   Transfers Overall transfer level: Needs assistance Equipment used: Rolling walker (2 wheeled) Transfers: Sit to/from Stand Sit to Stand: From elevated surface;Min assist;+2 physical assistance         General transfer comment: Assist to rise, stabilize, control descent. Multimodal cues for safety, technique, hand placement.   Ambulation/Gait Ambulation/Gait assistance: Min assist;+2 physical assistance;+2 safety/equipment Ambulation Distance (Feet): 7 Feet Assistive device: Rolling walker (2 wheeled) Gait Pattern/deviations: Step-to pattern;Step-through pattern;Decreased step length - right;Decreased step length - left;Shuffle;Trunk flexed Gait velocity: decr Gait velocity interpretation: Below normal speed for age/gender General Gait Details: cues for posture and position from RW; multiple short standing rests to complete task 2* fatigue   Stairs            Wheelchair Mobility    Modified Rankin (Stroke Patients Only)       Balance                                    Cognition Arousal/Alertness:  Awake/alert Behavior During Therapy: WFL for tasks assessed/performed Overall Cognitive Status: No family/caregiver present to determine baseline cognitive functioning                      Exercises General Exercises - Lower Extremity Ankle Circles/Pumps: AROM;Both;15 reps;Supine Heel Slides: AAROM;AROM;15 reps;Supine;Both Hip ABduction/ADduction: AAROM;AROM;Both;10 reps;Supine    General Comments        Pertinent Vitals/Pain Pain Assessment: No/denies pain    Home Living                      Prior Function            PT Goals (current goals can now be found in the care plan section) Acute Rehab PT Goals Patient Stated Goal: none stated. no family present PT Goal Formulation: Patient unable to participate in goal setting Time For Goal Achievement: 04/30/16 Potential to Achieve Goals: Good Progress towards PT goals: Progressing toward goals    Frequency  Min 3X/week    PT Plan Current plan remains appropriate    Co-evaluation             End of Session Equipment Utilized During Treatment: Gait belt Activity Tolerance: Patient limited by fatigue Patient left: in chair;with call bell/phone within reach;with chair alarm set     Time: [id number] PT Time Calculation (min) (ACUTE ONLY): 23 min  Charges:  $Gait Training: 8-22 mins $Therapeutic Exercise: 8-22 mins                    G Codes:  [name] [name] 04/18/2016, 12:45 PM

## 2016-04-18 NOTE — Progress Notes (Signed)
PROGRESS NOTE                                                                                                                                                                                                             Patient Demographics:    [name] [name], is a 81 y.o. female, DOB - 1935-05-25, AP:[id number]  Admit date - 04/15/2016   Admitting Physician Thurnell Lose, MD  Outpatient Primary MD for the patient is [name], MD  LOS - 3  Brief Narrative    81 y.o. female, With history of essential hypertension, diabetes mellitus type 2, dyslipidemia, peptic ulcer disease, poor balance with multiple falls recently started using a walker still lives at home, has been experiencing some dark stools along with generalized weakness.  In the ER patient was found to have heme positive stool, she appeared slightly weak, dehydrated and pale.   Assessment  & Plan :   Acute upper GI bleed with melena and acute blood loss anemia. 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  - CT scan with no evidence of pneumotosis or pneumomediastinum related to the esophagus findings, although she is at risk for this - per GI team, acute esophageal necrosis carries a mortality rate between 15-30% - recommended treatment is supportive care with IVF, NPO, broad spectrum antibiotics, and IV PPI.  - Long term, if she recovers from this, high rate of esophageal stricturing. - currently awaiting gastric biopsies to assess for malignancy. If this is the case pt will need a surgical consult but she is very high risk for surgery - TPN for nutrition   GERD - on PPI   Generalized weakness with multiple falls.  - PT evaluation requested - pt will need SNF  Essential hypertension - reasonable inpatient  control   Dehydration with hyponatremia and hypokalemia - improved with IVF and supplementation  - Mg is still low, continue to supplement Mg as well - repeat Mg and BMP in AM  Leukocytosis - has been on Unasyn  - resolved   Mild hematemesis with right-sided aspiration pneumonitis on 04/16/2016.  - Currently not hypoxic, cover with Unasyn and monitor. If stable switch to Augmentin for a total of 5 days treatment.  DM type II.  -  Continue on sliding scale will change it to every 6 hours since she is nothing by mouth. - A1C is 6.6   Family Communication  :  Pt at bedside   Code Status :  Full  Diet : NPO   Disposition Plan  :  Stay inpt  Consults  :  GI  Procedures  :  EGD due later today  DVT Prophylaxis  :    SCDs    Inpatient Medications  Scheduled Meds: . ampicillin-sulbactam (UNASYN) IV  1.5 g Intravenous Q6H  . bisacodyl  10 mg Rectal Daily  . erythromycin   Both Eyes QHS  . insulin aspart  0-9 Units Subcutaneous Q6H  . insulin aspart  14 Units Subcutaneous Once  . [START ON 04/19/2016] pantoprazole  40 mg Intravenous Q12H  . sodium chloride flush  10-40 mL Intracatheter Q12H  . sodium chloride flush  3 mL Intravenous Q12H   Continuous Infusions: . dextrose 5 % and 0.45 % NaCl with KCl 20 mEq/L 50 mL/hr at 04/18/16 1800  . Marland KitchenTPN (CLINIMIX-E) Adult 40 mL/hr at 04/18/16 1807   And  . fat emulsion 192 mL (04/18/16 1807)   PRN Meds:.albuterol, hydrALAZINE, [DISCONTINUED] ondansetron **OR** ondansetron (ZOFRAN) IV, sodium chloride flush  Antibiotics  :    Anti-infectives    Start     Dose/Rate Route Frequency Ordered Stop   04/16/16 0900  ampicillin-sulbactam (UNASYN) 1.5 g in sodium chloride 0.9 % 50 mL IVPB     1.5 g 100 mL/hr over 30 Minutes Intravenous Every 6 hours 04/16/16 0806           Objective:   Vitals:   04/18/16 1500 04/18/16 1600 04/18/16 1700 04/18/16 1800  BP:    (!) 131/93  Pulse: 96 88 88 87  Resp: 20 17 18 18   Temp:  97.7 F  (36.5 C)    TempSrc:  Oral    SpO2: 97% 100% 99% 100%  Weight:      Height:        Wt Readings from Last 3 Encounters:  04/18/16 76.1 kg (167 lb 12.3 oz)  01/20/16 78.5 kg (173 lb)  08/25/15 80.3 kg (177 lb)     Intake/Output Summary (Last 24 hours) at 04/18/16 1916 Last data filed at 04/18/16 1800  Gross per 24 hour  Intake          3046.67 ml  Output                0 ml  Net          3046.67 ml     Physical Exam  Awake , Oriented X 2, No new F.N deficits, Normal affect [place].AT,PERRAL Supple Neck,No JVD, No cervical lymphadenopathy appriciated.  Symmetrical Chest wall movement, Good air movement bilaterally, few R.sided rales RRR,No Gallops,Rubs or new Murmurs, No Parasternal Heave +ve B.Sounds, Abd Soft, No tenderness, No organomegaly appriciated, No rebound - guarding or rigidity. No Cyanosis, Clubbing or edema, No new Rash or bruise       Data Review:    CBC  Recent Labs Lab 04/15/16 1315  04/16/16 0530 04/16/16 1101 04/16/16 1624 04/16/16 2325 04/17/16 0529 04/18/16 1800  WBC 21.5*  --  20.2*  --   --   --  12.2* 8.3  HGB 12.5  < > 9.3* 8.5* 8.4* 8.3* 8.4* 8.5*  HCT 36.9  < > 27.2* 24.9* 24.6* 25.3* 25.0* 25.3*  PLT 455*  --  295  --   --   --  233 247  MCV 86.0  --  86.6  --   --   --  89.3 88.2  MCH 29.1  --  29.6  --   --   --  30.0 29.6  MCHC 33.9  --  34.2  --   --   --  33.6 33.6  RDW 15.7*  --  16.1*  --   --   --  17.1* 17.0*  LYMPHSABS 1.3  --   --   --   --   --   --   --   MONOABS 1.2*  --   --   --   --   --   --   --   EOSABS 0.0  --   --   --   --   --   --   --   BASOSABS 0.1  --   --   --   --   --   --   --   < > = values in this interval not displayed.  Chemistries   Recent Labs Lab 04/15/16 1315 04/16/16 0530 04/17/16 0529 04/18/16 1800  NA 129* 135 135 134*  K 3.3* 3.8 4.7 4.2  CL 89* 107 111 109  CO2 26 24 20* 21*  GLUCOSE 433* 94 157* 231*  BUN 35* 27* 22* 13  CREATININE 1.11* 0.86 0.85 0.86  CALCIUM 11.9* 9.9 8.8*  8.1*  MG  --   --   --  1.3*  AST 21  --   --   --   ALT 19  --   --   --   ALKPHOS 60  --   --   --   BILITOT 1.3*  --   --   --    ------------------------------------------------------------------------------------------------------------------ No results for input(s): CHOL, HDL, LDLCALC, TRIG, CHOLHDL, LDLDIRECT in the last 72 hours.  Lab Results  Component Value Date   HGBA1C 6.6 (H) 04/15/2016   ------------------------------------------------------------------------------------------------------------------ No results for input(s): TSH, T4TOTAL, T3FREE, THYROIDAB in the last 72 hours.  Invalid input(s): FREET3 ------------------------------------------------------------------------------------------------------------------ No results for input(s): VITAMINB12, FOLATE, FERRITIN, TIBC, IRON, RETICCTPCT in the last 72 hours.  Coagulation profile  Recent Labs Lab 04/15/16 1315  INR 1.13     ------------------------------------------------------------------------------------------------------------------ No results found for: BNP  Micro Results Recent Results (from the past 240 hour(s))  MRSA PCR Screening     Status: None   Collection Time: 04/17/16  6:27 PM  Result Value Ref Range Status   MRSA by PCR NEGATIVE NEGATIVE Final    Radiology Reports Dg Chest 2 View  Result Date: 03/29/2016 CLINICAL DATA:  Hypotension and weakness for 1 week. History of diabetes and high blood pressure. EXAM: CHEST  2 VIEW COMPARISON:  Seventeen 17 FINDINGS: Normal heart size and pulmonary vascularity. No focal airspace disease or consolidation in the lungs. No blunting of costophrenic angles. No pneumothorax. Mediastinal contours appear intact. Degenerative changes in the spine and shoulders. Calcification of the aorta. IMPRESSION: No active cardiopulmonary disease. Electronically Signed   By: Lucienne Capers M.D.   On: 03/29/2016 06:15   Ct Head Wo Contrast  Result Date:  04/15/2016 CLINICAL DATA:  Unwitnessed fall. Fell wall trying again out of bed. Trauma to head without loss of consciousness. Initial encounter. EXAM: CT HEAD WITHOUT CONTRAST CT CERVICAL SPINE WITHOUT CONTRAST TECHNIQUE: Multidetector CT imaging of the head and cervical spine was performed following the standard protocol without intravenous contrast. Multiplanar CT image reconstructions of the cervical spine were  also generated. COMPARISON:  CT head without contrast 03/29/2016. CT of the head and face 12/24/2015. FINDINGS: CT HEAD FINDINGS Moderate atrophy and periventricular white matter hypoattenuation is stable. This is slightly advanced for age. The ventricles proportionate to the degree of atrophy. A remote infarct of the right cerebellum is stable. No acute infarct, hemorrhage, or mass lesion is present. No significant extraaxial fluid collection is present. Minimal soft tissue swelling is present in the left occipital sub scalp without an underlying fracture. The calvarium is intact. A remote right inferior orbital floor fracture has healed. The paranasal sinuses and mastoid air cells are clear. CT CERVICAL SPINE FINDINGS The cervical spine is imaged from the skullbase through T2-3. Asymmetric degenerative changes are present at the left atlantooccipital joint. Degenerative changes are present at C1-2. There is chronic loss of disc height from C3 through C7. Vertebral body heights and alignment are maintained. Facet hypertrophy is worse on the right. Soft tissues the neck demonstrate thyroid atrophy. Atherosclerotic calcifications are present at the carotid bifurcations bilaterally without significant stenoses. A large right pleural effusion is present. The lung apices are otherwise clear. IMPRESSION: 1. Stable moderate atrophy and white matter disease. No acute intracranial abnormality. 2. Minimal soft tissue swelling in the left occipital scalp may be related to the acute trauma. There is no underlying  fracture. The 3. Remote right inferior orbital floor fracture has healed. 4. Multilevel degenerative changes in the cervical spine. 5. New moderate-sized right pleural effusion. Recommend two-view chest x-ray for further evaluation. Electronically Signed   By: San Morelle M.D.   On: 04/15/2016 14:40  Ct Head Wo Contrast  Result Date: 03/29/2016 CLINICAL DATA:  Hypoglycemic and hypotensive for about a week. Fell 6 days ago. Weakness since the fall. Confusion, double vision, and slurred speech for 1 day. Unsteady gait. EXAM: CT HEAD WITHOUT CONTRAST TECHNIQUE: Contiguous axial images were obtained from the base of the skull through the vertex without intravenous contrast. COMPARISON:  12/24/2015 FINDINGS: Mild cerebral atrophy. Ventricular dilatation consistent with central atrophy. Low-attenuation changes in the deep white matter consistent with small vessel ischemia. No mass effect or midline shift. No abnormal extra-axial fluid collections. Gray-white matter junctions are distinct. Basal cisterns are not effaced. No evidence of acute intracranial hemorrhage. No depressed skull fractures. Visualized paranasal sinuses and mastoid air cells are not opacified. IMPRESSION: No acute intracranial abnormalities. Chronic atrophy and small vessel ischemic changes. Electronically Signed   By: Lucienne Capers M.D.   On: 03/29/2016 04:26   Ct Chest W Contrast  Result Date: 04/17/2016 CLINICAL DATA:  Black esophagus on EGD today.  Rule out necrosis. EXAM: CT CHEST, ABDOMEN, AND PELVIS WITH CONTRAST TECHNIQUE: Multidetector CT imaging of the chest, abdomen and pelvis was performed following the standard protocol during bolus administration of intravenous contrast. CONTRAST:  115mL ISOVUE-300 IOPAMIDOL (ISOVUE-300) INJECTION 61% COMPARISON:  Chest radiographs yesterday. No prior chest CT. CT abdomen/pelvis 08/04/2012 FINDINGS: CT CHEST FINDINGS Cardiovascular: Tortuosity of the thoracic aorta without aneurysm.  There is moderate atherosclerosis. Conventional branching pattern from the aortic arch. No aortic dissection. Scattered coronary artery calcifications. Heart is normal in size. Mediastinum/Nodes: There is diffuse esophageal wall thickening from the thoracic inlet through the diaphragmatic hiatus. Intraluminal esophageal fluid. No evidence of pneumatosis. No pneumomediastinum. Small amount of paraesophageal fluid distally may be contiguous with right pleural effusion. Prominent right upper paratracheal node measures 10 mm short axis. Small lower paratracheal nodes not enlarged by size criteria. No evidence of hilar adenopathy. No axillary adenopathy. No  pericardial effusion. Lungs/Pleura: Moderate bilateral pleural effusions, right greater than left. There is adjacent compressive atelectasis in both lower lobes. Mild patchy opacities in the anterior right upper lobe, left upper lobe to a lesser extent and superior segment left lower lobe. There is minimal smooth septal thickening. Trachea and mainstem bronchi are patent. Musculoskeletal: There are no acute or suspicious osseous abnormalities. CT ABDOMEN PELVIS FINDINGS Hepatobiliary: No focal hepatic lesion. Streak artifact from arms down positioning. Question of nodular hepatic contours. Postcholecystectomy. No biliary dilatation. Pancreas: Atrophic parenchyma. No ductal dilatation or inflammation. Spleen: Unremarkable. Adrenals/Urinary Tract: No adrenal nodule. Symmetric renal enhancement. No hydronephrosis. Motion artifact through the lower left kidney. Contour deformity is likely secondary to lobular contours and motion, less likely focal renal lesion. Urinary bladder is physiologically distended. Stomach/Bowel: Diffuse wall thickening and edematous appearance. Small volume of intraluminal gastric fluid. No pneumatosis. Small bowel is decompressed. No definite small bowel thickening. Extensive colonic diverticulosis of the descending and sigmoid colon without  acute diverticulitis. Scattered colonic diverticular throughout the remainder the colon. Appendix tentatively identified and normal. There is mild wall thickening at the rectosigmoid junction with adjacent perirectal fat stranding. Vascular/Lymphatic: Abdominal aortic atherosclerosis. No aneurysm. Circumaortic left renal vein. No retroperitoneal adenopathy. Reproductive: Uterus is atrophic, normal for age.  No adnexal mass. Other: Small fat containing upper ventral abdominal wall hernia. Small amount of ascites adjacent to the liver in the spleen. There is no free air. No intra-abdominal abscess. There is whole body wall edema. Musculoskeletal: Degenerative change throughout spine. There are no acute or suspicious osseous abnormalities. IMPRESSION: 1. Esophagus is diffusely edematous with diffuse wall thickening and intraluminal fluid. No pneumatosis or pneumomediastinum. No CT findings to suggest necrosis. Similar edematous appearance of the stomach. 2. Moderate bilateral pleural effusions with adjacent compressive atelectasis. Minimal smooth septal thickening may reflect pulmonary edema. 3. Contour deformity of the lower left kidney likely related to lobular contours and motion, difficult to exclude exophytic lesion. Nonemergent renal ultrasound could be considered when patient is able. 4. Rather extensive colonic diverticulosis, no diverticulitis. Rectus sigmoid colonic wall thickening with adjacent edema, suggesting chronic constipation. 5. Question of nodular hepatic contours raising concern for cirrhosis. 6. Whole body wall edema.  Minimal abdominal ascites. 7. Thoracoabdominal aortic atherosclerosis. Coronary artery calcifications. Electronically Signed   By: Jeb Levering M.D.   On: 04/17/2016 22:00   Ct Cervical Spine Wo Contrast  Result Date: 04/15/2016 CLINICAL DATA:  Unwitnessed fall. Fell wall trying again out of bed. Trauma to head without loss of consciousness. Initial encounter. EXAM: CT HEAD  WITHOUT CONTRAST CT CERVICAL SPINE WITHOUT CONTRAST TECHNIQUE: Multidetector CT imaging of the head and cervical spine was performed following the standard protocol without intravenous contrast. Multiplanar CT image reconstructions of the cervical spine were also generated. COMPARISON:  CT head without contrast 03/29/2016. CT of the head and face 12/24/2015. FINDINGS: CT HEAD FINDINGS Moderate atrophy and periventricular white matter hypoattenuation is stable. This is slightly advanced for age. The ventricles proportionate to the degree of atrophy. A remote infarct of the right cerebellum is stable. No acute infarct, hemorrhage, or mass lesion is present. No significant extraaxial fluid collection is present. Minimal soft tissue swelling is present in the left occipital sub scalp without an underlying fracture. The calvarium is intact. A remote right inferior orbital floor fracture has healed. The paranasal sinuses and mastoid air cells are clear. CT CERVICAL SPINE FINDINGS The cervical spine is imaged from the skullbase through T2-3. Asymmetric degenerative changes are present at  the left atlantooccipital joint. Degenerative changes are present at C1-2. There is chronic loss of disc height from C3 through C7. Vertebral body heights and alignment are maintained. Facet hypertrophy is worse on the right. Soft tissues the neck demonstrate thyroid atrophy. Atherosclerotic calcifications are present at the carotid bifurcations bilaterally without significant stenoses. A large right pleural effusion is present. The lung apices are otherwise clear. IMPRESSION: 1. Stable moderate atrophy and white matter disease. No acute intracranial abnormality. 2. Minimal soft tissue swelling in the left occipital scalp may be related to the acute trauma. There is no underlying fracture. The 3. Remote right inferior orbital floor fracture has healed. 4. Multilevel degenerative changes in the cervical spine. 5. New moderate-sized right  pleural effusion. Recommend two-view chest x-ray for further evaluation. Electronically Signed   By: San Morelle M.D.   On: 04/15/2016 14:40  Ct Abdomen Pelvis W Contrast  Result Date: 04/17/2016 CLINICAL DATA:  Black esophagus on EGD today.  Rule out necrosis. EXAM: CT CHEST, ABDOMEN, AND PELVIS WITH CONTRAST TECHNIQUE: Multidetector CT imaging of the chest, abdomen and pelvis was performed following the standard protocol during bolus administration of intravenous contrast. CONTRAST:  152mL ISOVUE-300 IOPAMIDOL (ISOVUE-300) INJECTION 61% COMPARISON:  Chest radiographs yesterday. No prior chest CT. CT abdomen/pelvis 08/04/2012 FINDINGS: CT CHEST FINDINGS Cardiovascular: Tortuosity of the thoracic aorta without aneurysm. There is moderate atherosclerosis. Conventional branching pattern from the aortic arch. No aortic dissection. Scattered coronary artery calcifications. Heart is normal in size. Mediastinum/Nodes: There is diffuse esophageal wall thickening from the thoracic inlet through the diaphragmatic hiatus. Intraluminal esophageal fluid. No evidence of pneumatosis. No pneumomediastinum. Small amount of paraesophageal fluid distally may be contiguous with right pleural effusion. Prominent right upper paratracheal node measures 10 mm short axis. Small lower paratracheal nodes not enlarged by size criteria. No evidence of hilar adenopathy. No axillary adenopathy. No pericardial effusion. Lungs/Pleura: Moderate bilateral pleural effusions, right greater than left. There is adjacent compressive atelectasis in both lower lobes. Mild patchy opacities in the anterior right upper lobe, left upper lobe to a lesser extent and superior segment left lower lobe. There is minimal smooth septal thickening. Trachea and mainstem bronchi are patent. Musculoskeletal: There are no acute or suspicious osseous abnormalities. CT ABDOMEN PELVIS FINDINGS Hepatobiliary: No focal hepatic lesion. Streak artifact from arms down  positioning. Question of nodular hepatic contours. Postcholecystectomy. No biliary dilatation. Pancreas: Atrophic parenchyma. No ductal dilatation or inflammation. Spleen: Unremarkable. Adrenals/Urinary Tract: No adrenal nodule. Symmetric renal enhancement. No hydronephrosis. Motion artifact through the lower left kidney. Contour deformity is likely secondary to lobular contours and motion, less likely focal renal lesion. Urinary bladder is physiologically distended. Stomach/Bowel: Diffuse wall thickening and edematous appearance. Small volume of intraluminal gastric fluid. No pneumatosis. Small bowel is decompressed. No definite small bowel thickening. Extensive colonic diverticulosis of the descending and sigmoid colon without acute diverticulitis. Scattered colonic diverticular throughout the remainder the colon. Appendix tentatively identified and normal. There is mild wall thickening at the rectosigmoid junction with adjacent perirectal fat stranding. Vascular/Lymphatic: Abdominal aortic atherosclerosis. No aneurysm. Circumaortic left renal vein. No retroperitoneal adenopathy. Reproductive: Uterus is atrophic, normal for age.  No adnexal mass. Other: Small fat containing upper ventral abdominal wall hernia. Small amount of ascites adjacent to the liver in the spleen. There is no free air. No intra-abdominal abscess. There is whole body wall edema. Musculoskeletal: Degenerative change throughout spine. There are no acute or suspicious osseous abnormalities. IMPRESSION: 1. Esophagus is diffusely edematous with diffuse wall thickening and  intraluminal fluid. No pneumatosis or pneumomediastinum. No CT findings to suggest necrosis. Similar edematous appearance of the stomach. 2. Moderate bilateral pleural effusions with adjacent compressive atelectasis. Minimal smooth septal thickening may reflect pulmonary edema. 3. Contour deformity of the lower left kidney likely related to lobular contours and motion, difficult  to exclude exophytic lesion. Nonemergent renal ultrasound could be considered when patient is able. 4. Rather extensive colonic diverticulosis, no diverticulitis. Rectus sigmoid colonic wall thickening with adjacent edema, suggesting chronic constipation. 5. Question of nodular hepatic contours raising concern for cirrhosis. 6. Whole body wall edema.  Minimal abdominal ascites. 7. Thoracoabdominal aortic atherosclerosis. Coronary artery calcifications. Electronically Signed   By: Jeb Levering M.D.   On: 04/17/2016 22:00   Dg Chest Port 1 View  Result Date: 04/16/2016 CLINICAL DATA:  Increased cough and congestion this morning. EXAM: PORTABLE CHEST 1 VIEW COMPARISON:  Chest x-rays dated 04/15/2016 and 03/29/2016. FINDINGS: Today's exam is slightly hypoinspiratory with crowding of the perihilar bronchovascular markings. New patchy airspace opacities are seen at the right lung base. Left lung remains clear. Cardiomediastinal silhouette is stable. Atherosclerotic changes noted at the aortic arch. Osseous structures about the chest are unremarkable. IMPRESSION: Low lung volumes. New patchy airspace opacities at the right lung base, atelectasis versus early developing pneumonia. Aortic atherosclerosis. Electronically Signed   By: Franki Cabot M.D.   On: 04/16/2016 08:16  Dg Chest Port 1 View  Result Date: 04/15/2016 CLINICAL DATA:  Unwitnessed fall today.  Pleural effusion. EXAM: PORTABLE CHEST 1 VIEW COMPARISON:  03/29/2016 FINDINGS: Heart is borderline in size. Mild vascular congestion. Bibasilar atelectasis. No visible effusions or pneumothorax. No acute bony abnormality. IMPRESSION: Borderline heart size with vascular congestion. Bibasilar atelectasis. Electronically Signed   By: Rolm Baptise M.D.   On: 04/15/2016 15:19  Dg Chest Portable 1 View  Result Date: 03/26/2016 CLINICAL DATA:  Weakness, hypotension EXAM: PORTABLE CHEST 1 VIEW COMPARISON:  06/03/2008 FINDINGS: Cardiomediastinal silhouette is  stable. No acute infiltrate or pleural effusion. No pulmonary edema. Mild degenerative changes thoracic spine. Degenerative changes bilateral shoulders. IMPRESSION: No active disease. Electronically Signed   By: Lahoma Crocker M.D.   On: 03/26/2016 15:22   Dg Abd Portable 1v  Result Date: 04/16/2016 CLINICAL DATA:  Melena. Ongoing heartburn. Pelvic pain since this morning. EXAM: PORTABLE ABDOMEN - 1 VIEW COMPARISON:  None FINDINGS: Moderate stool burden throughout the colon. Nonobstructive bowel gas pattern. No free air, organomegaly or suspicious calcification. Degenerative changes in the lumbar spine and hips. No acute bony abnormality. IMPRESSION: Moderate stool burden.  No acute findings. Electronically Signed   By: Rolm Baptise M.D.   On: 04/16/2016 09:30   Time Spent in minutes  30  MAGICK-Lucindy Borel M.D on 04/18/2016 at 7:16 PM  Between 7am to 7pm - Pager - [phone]  After 7pm go to [email or web address] - password [facility]  [facility] -  Office  [phone]

## 2016-04-18 NOTE — Progress Notes (Signed)
Peripherally Inserted Central Catheter/Midline Placement  The IV Nurse has discussed with the patient and/or persons authorized to consent for the patient, the purpose of this procedure and the potential benefits and risks involved with this procedure.  The benefits include less needle sticks, lab draws from the catheter and patient may be discharged home with the catheter.  Risks include, but not limited to, infection, bleeding, blood clot (thrombus formation), and puncture of an artery; nerve damage and irregular heat beat.  Alternatives to this procedure were also discussed.  PICC/Midline Placement Documentation        [name] [name] 04/18/2016, 3:54 PM Consent obtained from daughter at bedside

## 2016-04-19 LAB — COMPREHENSIVE METABOLIC PANEL WITH GFR
ALT: 15 U/L (ref 14–54)
AST: 18 U/L (ref 15–41)
Albumin: 2 g/dL — ABNORMAL LOW (ref 3.5–5.0)
Alkaline Phosphatase: 38 U/L (ref 38–126)
Anion gap: 3 — ABNORMAL LOW (ref 5–15)
BUN: 12 mg/dL (ref 6–20)
CO2: 22 mmol/L (ref 22–32)
Calcium: 8 mg/dL — ABNORMAL LOW (ref 8.9–10.3)
Chloride: 107 mmol/L (ref 101–111)
Creatinine, Ser: 0.81 mg/dL (ref 0.44–1.00)
GFR calc Af Amer: >60 mL/min
GFR calc non Af Amer: >60 mL/min
Glucose, Bld: 295 mg/dL — ABNORMAL HIGH (ref 65–99)
Potassium: 3.9 mmol/L (ref 3.5–5.1)
Sodium: 132 mmol/L — ABNORMAL LOW (ref 135–145)
Total Bilirubin: 0.4 mg/dL (ref 0.3–1.2)
Total Protein: 4.6 g/dL — ABNORMAL LOW (ref 6.5–8.1)

## 2016-04-19 LAB — CBC
HCT: 23.3 % — ABNORMAL LOW (ref 36.0–46.0)
Hemoglobin: 7.9 g/dL — ABNORMAL LOW (ref 12.0–15.0)
MCH: 29.9 pg (ref 26.0–34.0)
MCHC: 33.9 g/dL (ref 30.0–36.0)
MCV: 88.3 fL (ref 78.0–100.0)
Platelets: 221 10*3/uL (ref 150–400)
RBC: 2.64 MIL/uL — ABNORMAL LOW (ref 3.87–5.11)
RDW: 16.8 % — ABNORMAL HIGH (ref 11.5–15.5)
WBC: 6.4 10*3/uL (ref 4.0–10.5)

## 2016-04-19 LAB — GLUCOSE, CAPILLARY
GLUCOSE-CAPILLARY: 222 mg/dL — AB (ref 65–99)
GLUCOSE-CAPILLARY: 287 mg/dL — AB (ref 65–99)
Glucose-Capillary: 305 mg/dL — ABNORMAL HIGH (ref 65–99)
Glucose-Capillary: 219 mg/dL — ABNORMAL HIGH (ref 65–99)
Glucose-Capillary: 185 mg/dL — ABNORMAL HIGH (ref 65–99)
Glucose-Capillary: 124 mg/dL — ABNORMAL HIGH (ref 65–99)

## 2016-04-19 LAB — DIFFERENTIAL
BASOS PCT: 0 %
Basophils Absolute: 0 10*3/uL (ref 0.0–0.1)
Eosinophils Absolute: 0.1 10*3/uL (ref 0.0–0.7)
Eosinophils Relative: 2 %
Lymphocytes Relative: 16 %
Lymphs Abs: 1 10*3/uL (ref 0.7–4.0)
MONO ABS: 0.5 10*3/uL (ref 0.1–1.0)
MONOS PCT: 8 %
NEUTROS ABS: 4.8 10*3/uL (ref 1.7–7.7)
Neutrophils Relative %: 74 %

## 2016-04-19 LAB — TRIGLYCERIDES: Triglycerides: 126 mg/dL (ref <150)

## 2016-04-19 LAB — PHOSPHORUS: Phosphorus: 1.8 mg/dL — ABNORMAL LOW (ref 2.5–4.6)

## 2016-04-19 LAB — MAGNESIUM: MAGNESIUM: 1.4 mg/dL — AB (ref 1.7–2.4)

## 2016-04-19 LAB — PREALBUMIN: Prealbumin: 7.2 mg/dL — ABNORMAL LOW (ref 18–38)

## 2016-04-19 MED ORDER — MAGNESIUM SULFATE 4 GM/100ML IV SOLN
4.0000 g | Freq: Once | INTRAVENOUS | Status: AC
  Administered 2016-04-19: 4 g via INTRAVENOUS
  Filled 2016-04-19: qty 100

## 2016-04-19 MED ORDER — INSULIN ASPART 100 UNIT/ML ~~LOC~~ SOLN
0.0000 [IU] | SUBCUTANEOUS | Status: DC
  Administered 2016-04-19: 7 [IU] via SUBCUTANEOUS
  Administered 2016-04-19: 11 [IU] via SUBCUTANEOUS
  Administered 2016-04-19: 3 [IU] via SUBCUTANEOUS
  Administered 2016-04-19 – 2016-04-20 (×3): 4 [IU] via SUBCUTANEOUS
  Administered 2016-04-20 (×2): 3 [IU] via SUBCUTANEOUS
  Administered 2016-04-20: 4 [IU] via SUBCUTANEOUS
  Administered 2016-04-20: 3 [IU] via SUBCUTANEOUS
  Administered 2016-04-21 (×2): 7 [IU] via SUBCUTANEOUS
  Administered 2016-04-21 (×2): 4 [IU] via SUBCUTANEOUS
  Administered 2016-04-21 (×2): 7 [IU] via SUBCUTANEOUS
  Administered 2016-04-22: 4 [IU] via SUBCUTANEOUS
  Administered 2016-04-22: 7 [IU] via SUBCUTANEOUS
  Administered 2016-04-22 (×2): 4 [IU] via SUBCUTANEOUS
  Administered 2016-04-22: 3 [IU] via SUBCUTANEOUS
  Administered 2016-04-22: 7 [IU] via SUBCUTANEOUS
  Administered 2016-04-23: 4 [IU] via SUBCUTANEOUS
  Administered 2016-04-23: 3 [IU] via SUBCUTANEOUS
  Administered 2016-04-23: 4 [IU] via SUBCUTANEOUS
  Administered 2016-04-23: 3 [IU] via SUBCUTANEOUS
  Administered 2016-04-23 – 2016-04-24 (×3): 4 [IU] via SUBCUTANEOUS
  Administered 2016-04-24 (×2): 3 [IU] via SUBCUTANEOUS
  Administered 2016-04-24 (×3): 4 [IU] via SUBCUTANEOUS
  Administered 2016-04-25: 3 [IU] via SUBCUTANEOUS
  Administered 2016-04-25 (×2): 4 [IU] via SUBCUTANEOUS
  Administered 2016-04-25 – 2016-04-26 (×4): 3 [IU] via SUBCUTANEOUS
  Administered 2016-04-26: 4 [IU] via SUBCUTANEOUS
  Administered 2016-04-26: 3 [IU] via SUBCUTANEOUS
  Administered 2016-04-27: 4 [IU] via SUBCUTANEOUS
  Administered 2016-04-27: 3 [IU] via SUBCUTANEOUS

## 2016-04-19 MED ORDER — TRACE MINERALS CR-CU-MN-SE-ZN 10-1000-500-60 MCG/ML IV SOLN
INTRAVENOUS | Status: AC
  Administered 2016-04-19: 17:00:00 via INTRAVENOUS
  Filled 2016-04-19 (×2): qty 960

## 2016-04-19 MED ORDER — SODIUM PHOSPHATES 45 MMOLE/15ML IV SOLN
30.0000 mmol | Freq: Once | INTRAVENOUS | Status: AC
  Administered 2016-04-19: 30 mmol via INTRAVENOUS
  Filled 2016-04-19: qty 10

## 2016-04-19 MED ORDER — FAT EMULSION 20 % IV EMUL
192.0000 mL | INTRAVENOUS | Status: AC
  Administered 2016-04-19: 192 mL via INTRAVENOUS
  Filled 2016-04-19: qty 250
  Filled 2016-04-19: qty 200

## 2016-04-19 MED ORDER — CETYLPYRIDINIUM CHLORIDE 0.05 % MT LIQD
7.0000 mL | Freq: Two times a day (BID) | OROMUCOSAL | Status: DC
  Administered 2016-04-19 – 2016-04-26 (×12): 7 mL via OROMUCOSAL

## 2016-04-19 MED ORDER — CHLORHEXIDINE GLUCONATE 0.12 % MT SOLN
15.0000 mL | Freq: Two times a day (BID) | OROMUCOSAL | Status: DC
  Administered 2016-04-19 – 2016-04-27 (×12): 15 mL via OROMUCOSAL
  Filled 2016-04-19 (×13): qty 15

## 2016-04-19 MED ORDER — POTASSIUM CHLORIDE IN NACL 20-0.45 MEQ/L-% IV SOLN
INTRAVENOUS | Status: DC
  Administered 2016-04-19 – 2016-04-21 (×3): via INTRAVENOUS
  Administered 2016-04-22: 30 mL via INTRAVENOUS
  Administered 2016-04-22: 17:00:00 via INTRAVENOUS
  Filled 2016-04-19 (×5): qty 1000

## 2016-04-19 NOTE — Progress Notes (Signed)
PARENTERAL NUTRITION CONSULT NOTE - Follow Up  Pharmacy Consult for TPN Indication: gastric outlet obstruction  Allergies  Allergen Reactions  . Ace Inhibitors Cough  . Prednisone     REACTION: lethargic  . Sitagliptin     Other reaction(s): Unknown    Patient Measurements: Height: 5\' 5"  (165.1 cm) Weight: 169 lb 8.5 oz (76.9 kg) IBW/kg (Calculated) : 57 Adjusted Body Weight: 72.2 kg  Vital Signs: Temp: 98 F (36.7 C) (08/03 0400) Temp Source: Oral (08/03 0400) BP: 158/70 (08/03 0400) Pulse Rate: 76 (08/03 0400) Intake/Output from previous day: 08/02 0701 - 08/03 0700 In: 2667.1 [I.V.:2467.1; IV Piggyback:200] Out: 1050 [Urine:1050] Intake/Output from this shift: No intake/output data recorded.  Labs:  Recent Labs  04/16/16 2325 04/17/16 0529 04/18/16 1800  WBC  --  12.2* 8.3  HGB 8.3* 8.4* 8.5*  HCT 25.3* 25.0* 25.3*  PLT  --  233 247     Recent Labs  04/17/16 0529 04/18/16 1800  NA 135 134*  K 4.7 4.2  CL 111 109  CO2 20* 21*  GLUCOSE 157* 231*  BUN 22* 13  CREATININE 0.85 0.86  CALCIUM 8.8* 8.1*  MG  --  1.3*  PHOS  --  1.3*   Estimated Creatinine Clearance: 55.3 mL/min (by C-G formula based on SCr of 0.86 mg/dL).    Recent Labs  04/18/16 1213 04/18/16 1749 04/18/16 2334  GLUCAP 209* 198* 305*    Medical History: Past Medical History:  Diagnosis Date  . DDD (degenerative disc disease), lumbar   . Diabetes mellitus   . Diverticulosis   . Endocervical polyp   . Endometrial polyp   . GERD (gastroesophageal reflux disease)   . Hiatal hernia   . Hx of adenomatous colonic polyps   . Hyperlipidemia   . Hypertension   . Hyponatremia     Medications:  Scheduled:  . ampicillin-sulbactam (UNASYN) IV  1.5 g Intravenous Q6H  . bisacodyl  10 mg Rectal Daily  . erythromycin   Both Eyes QHS  . insulin aspart  0-9 Units Subcutaneous Q6H  . insulin aspart  14 Units Subcutaneous Once  . magnesium sulfate 1 - 4 g bolus IVPB  4 g  Intravenous Once  . pantoprazole  40 mg Intravenous Q12H  . sodium chloride flush  10-40 mL Intracatheter Q12H  . sodium chloride flush  3 mL Intravenous Q12H  . sodium phosphate  Dextrose 5% IVPB  30 mmol Intravenous Once   Infusions:  . dextrose 5 % and 0.45 % NaCl with KCl 20 mEq/L 50 mL/hr at 04/18/16 1800  . Marland KitchenTPN (CLINIMIX-E) Adult 40 mL/hr at 04/18/16 1807   And  . fat emulsion 192 mL (04/18/16 1807)   PRN: albuterol, hydrALAZINE, [DISCONTINUED] ondansetron **OR** ondansetron (ZOFRAN) IV, sodium chloride flush  Insulin Requirements: 23 units Novolog required since starting TPN last PM  Current Nutrition: NPO  IVF: D545 with 20KCl at 50 ml/hr  Central access: PICC placed 8/2 TPN start date: 8/2  ASSESSMENT  HPI: 81 y.o.female,With history of essential hypertension, diabetes mellitus type 2, dyslipidemia, peptic ulcer disease, poor balance with multiple falls.  Admitted for dark stools which were heme positive with generalized weakness. EGD 7/31 diffuse esophagitis and a black necrotic appearing esophagus, pyloric mass causing gastric outlet obstruction. Biopsies pending. May need surgery. GI suggests TPN.  Significant events:   Labs 8/1:    Glucose: significantly rising since start of TPN; PTA glipizide & metformin on hold  Electrolytes:   Na slightly low  Corrected Ca WNL   Mg/Phos low  Renal: SCr WNL  LFTs: Tbili slightly elevated on 7/30 but improved to WNL today  TGs: pending  Prealbumin: pending  NUTRITIONAL GOALS                                                                                             RD recs 7/31: 1250-1400 kcal, 70-85g protein per day Clinimix E 5/15 at a goal rate of 71ml/hr + 20% fat emulsion at 71ml/hr to provide: 72g/day protein, 1406Kcal/day.  PLAN                                                                                                                           Now:  Magnesium sulfate 4g IV x 1  Sodium phosphate 27mmol IV x 1 now  At 1800 today:  Contine Clinimix E 5/15 at 40 ml/hr until improved CBG control and electrolytes normalize   20% fat emulsion at 71ml/hr.  Plan to advance as tolerated to the goal rate.  TPN to contain standard multivitamins and trace elements.  Add regular insulin to TPN to receive 8 units per 24hr  Continue IVF at 79ml/hr but remove dextrose to help CBGs  Increase SSI to resistant scale q4h.   TPN lab panels on Mondays & Thursdays.  F/u daily.   [name], PharmD, BCPS Pager: [phone] 04/19/2016,7:31 AM

## 2016-04-19 NOTE — Progress Notes (Signed)
Nutrition Follow-up  DOCUMENTATION CODES:   Not applicable  INTERVENTION:  -TPN per pharmacy -Recommend monitor Mg, Phos, K for at least 3 days, replete as necessary, patient is possibly refeeding -RD to continue to monitor  NUTRITION DIAGNOSIS:   Inadequate oral intake related to poor appetite, other (see comment) (Heartburn / Burning sensation) as evidenced by per patient/family report. -ongoing  GOAL:   Patient will meet greater than or equal to 90% of their needs -progressing  MONITOR:   PO intake, I & O's, Labs, Weight trends, Supplement acceptance  REASON FOR ASSESSMENT:   Malnutrition Screening Tool    ASSESSMENT:   [name] [name]  is a 81 y.o. female, With history of essential hypertension, diabetes mellitus type 2, dyslipidemia, peptic ulcer disease, poor balance with multiple falls recently started using a walker still lives at home whose been experiencing some dark stools for the last few days along with generalized weakness.  Pt is now NPO on TPN Wt is up 5kg since admission. No nausea/vomiting Had a BM yesterday. 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 Awaiting gastric biopsies.  Labs and Medications reviewed: CBGs 222-305, Na 132, Phos 1.8, Mg 1.4, Prealbumin 7.2  plan per pharmacy: PLAN                                                                                                                          Now:  Magnesium sulfate 4g IV x 1  Sodium phosphate 24mmol IV x 1 now  At 1800 today:  Contine Clinimix E 5/15 at 40 ml/hr until improved CBG control and electrolytes normalize   20% fat emulsion at 74ml/hr.  Plan to advance as tolerated to the goal rate.  TPN to contain standard multivitamins and trace elements.  Add regular insulin to TPN to receive 8 units per 24hr  Continue IVF at 25ml/hr but remove dextrose to  help CBGs  Increase SSI to resistant scale q4h.   TPN lab panels on Mondays & Thursdays   RD will continue to monitor daily.   Diet Order:  Diet NPO time specified TPN (CLINIMIX-E) Adult TPN (CLINIMIX-E) Adult  Skin:  Reviewed, no issues  Last BM:  7/29  Height:   Ht Readings from Last 1 Encounters:  04/17/16 5\' 5"  (1.651 m)    Weight:   Wt Readings from Last 1 Encounters:  04/19/16 169 lb 8.5 oz (76.9 kg)    Ideal Body Weight:  50 kg  BMI:  Body mass index is 28.21 kg/m.  Estimated Nutritional Needs:   Kcal:  1250-1400 calories  Protein:  70-85 grams  Fluid:  >/= 1.25L  EDUCATION NEEDS:   No education needs identified at this time  Satira Anis. Davinia Riccardi, MS, RD LDN Inpatient Clinical Dietitian Pager [phone]

## 2016-04-19 NOTE — Progress Notes (Signed)
Patient ID: [name] [name], female   DOB: Jul 18, 1935, 81 y.o.   MRN: [id number]    Progress Note   Subjective   Looks brighter, more alert and conversant today - denies any pain, nausea etc PICC  placed and TPN started Path still pending   Objective   Vital signs in last 24 hours: Temp:  [97.5 F (36.4 C)-98.6 F (37 C)] 98 F (36.7 C) (08/03 0400) Pulse Rate:  [74-96] 76 (08/03 0400) Resp:  [15-20] 19 (08/03 0400) BP: (111-158)/(50-93) 158/70 (08/03 0400) SpO2:  [94 %-100 %] 94 % (08/03 0400) Weight:  [169 lb 8.5 oz (76.9 kg)-170 lb 6.7 oz (77.3 kg)] 169 lb 8.5 oz (76.9 kg) (08/03 0423) Last BM Date: 04/18/16 General:  elderly  white female in NAD Heart:  Regular rate and rhythm; no murmurs Lungs: Respirations even and unlabored, lungs CTA bilaterally Abdomen:  Soft, nontender and nondistended. Normal bowel sounds. Extremities:  Edematous upper and lower  Neurologic:  Alert and oriented,  grossly normal neurologically. Psych:  Cooperative. Normal mood and affect.  Intake/Output from previous day: 08/02 0701 - 08/03 0700 In: 2667.1 [I.V.:2467.1; IV Piggyback:200] Out: 1050 [Urine:1050] Intake/Output this shift: No intake/output data recorded.  Lab Results:  Recent Labs  04/16/16 2325 04/17/16 0529 04/18/16 1800  WBC  --  12.2* 8.3  HGB 8.3* 8.4* 8.5*  HCT 25.3* 25.0* 25.3*  PLT  --  233 247   BMET  Recent Labs  04/17/16 0529 04/18/16 1800  NA 135 134*  K 4.7 4.2  CL 111 109  CO2 20* 21*  GLUCOSE 157* 231*  BUN 22* 13  CREATININE 0.85 0.86  CALCIUM 8.8* 8.1*   LFT No results for input(s): PROT, ALBUMIN, AST, ALT, ALKPHOS, BILITOT, BILIDIR, IBILI in the last 72 hours. PT/INR No results for input(s): LABPROT, INR in the last 72 hours.  Studies/Results: Ct Chest W Contrast  Result Date: 04/17/2016 CLINICAL DATA:  Black esophagus on EGD today.  Rule out necrosis. EXAM: CT CHEST, ABDOMEN, AND PELVIS WITH CONTRAST TECHNIQUE: Multidetector CT imaging  of the chest, abdomen and pelvis was performed following the standard protocol during bolus administration of intravenous contrast. CONTRAST:  130mL ISOVUE-300 IOPAMIDOL (ISOVUE-300) INJECTION 61% COMPARISON:  Chest radiographs yesterday. No prior chest CT. CT abdomen/pelvis 08/04/2012 FINDINGS: CT CHEST FINDINGS Cardiovascular: Tortuosity of the thoracic aorta without aneurysm. There is moderate atherosclerosis. Conventional branching pattern from the aortic arch. No aortic dissection. Scattered coronary artery calcifications. Heart is normal in size. Mediastinum/Nodes: There is diffuse esophageal wall thickening from the thoracic inlet through the diaphragmatic hiatus. Intraluminal esophageal fluid. No evidence of pneumatosis. No pneumomediastinum. Small amount of paraesophageal fluid distally may be contiguous with right pleural effusion. Prominent right upper paratracheal node measures 10 mm short axis. Small lower paratracheal nodes not enlarged by size criteria. No evidence of hilar adenopathy. No axillary adenopathy. No pericardial effusion. Lungs/Pleura: Moderate bilateral pleural effusions, right greater than left. There is adjacent compressive atelectasis in both lower lobes. Mild patchy opacities in the anterior right upper lobe, left upper lobe to a lesser extent and superior segment left lower lobe. There is minimal smooth septal thickening. Trachea and mainstem bronchi are patent. Musculoskeletal: There are no acute or suspicious osseous abnormalities. CT ABDOMEN PELVIS FINDINGS Hepatobiliary: No focal hepatic lesion. Streak artifact from arms down positioning. Question of nodular hepatic contours. Postcholecystectomy. No biliary dilatation. Pancreas: Atrophic parenchyma. No ductal dilatation or inflammation. Spleen: Unremarkable. Adrenals/Urinary Tract: No adrenal nodule. Symmetric renal enhancement. No hydronephrosis. Motion  artifact through the lower left kidney. Contour deformity is likely  secondary to lobular contours and motion, less likely focal renal lesion. Urinary bladder is physiologically distended. Stomach/Bowel: Diffuse wall thickening and edematous appearance. Small volume of intraluminal gastric fluid. No pneumatosis. Small bowel is decompressed. No definite small bowel thickening. Extensive colonic diverticulosis of the descending and sigmoid colon without acute diverticulitis. Scattered colonic diverticular throughout the remainder the colon. Appendix tentatively identified and normal. There is mild wall thickening at the rectosigmoid junction with adjacent perirectal fat stranding. Vascular/Lymphatic: Abdominal aortic atherosclerosis. No aneurysm. Circumaortic left renal vein. No retroperitoneal adenopathy. Reproductive: Uterus is atrophic, normal for age.  No adnexal mass. Other: Small fat containing upper ventral abdominal wall hernia. Small amount of ascites adjacent to the liver in the spleen. There is no free air. No intra-abdominal abscess. There is whole body wall edema. Musculoskeletal: Degenerative change throughout spine. There are no acute or suspicious osseous abnormalities. IMPRESSION: 1. Esophagus is diffusely edematous with diffuse wall thickening and intraluminal fluid. No pneumatosis or pneumomediastinum. No CT findings to suggest necrosis. Similar edematous appearance of the stomach. 2. Moderate bilateral pleural effusions with adjacent compressive atelectasis. Minimal smooth septal thickening may reflect pulmonary edema. 3. Contour deformity of the lower left kidney likely related to lobular contours and motion, difficult to exclude exophytic lesion. Nonemergent renal ultrasound could be considered when patient is able. 4. Rather extensive colonic diverticulosis, no diverticulitis. Rectus sigmoid colonic wall thickening with adjacent edema, suggesting chronic constipation. 5. Question of nodular hepatic contours raising concern for cirrhosis. 6. Whole body wall edema.   Minimal abdominal ascites. 7. Thoracoabdominal aortic atherosclerosis. Coronary artery calcifications. Electronically Signed   By: Jeb Levering M.D.   On: 04/17/2016 22:00   Ct Abdomen Pelvis W Contrast  Result Date: 04/17/2016 CLINICAL DATA:  Black esophagus on EGD today.  Rule out necrosis. EXAM: CT CHEST, ABDOMEN, AND PELVIS WITH CONTRAST TECHNIQUE: Multidetector CT imaging of the chest, abdomen and pelvis was performed following the standard protocol during bolus administration of intravenous contrast. CONTRAST:  15mL ISOVUE-300 IOPAMIDOL (ISOVUE-300) INJECTION 61% COMPARISON:  Chest radiographs yesterday. No prior chest CT. CT abdomen/pelvis 08/04/2012 FINDINGS: CT CHEST FINDINGS Cardiovascular: Tortuosity of the thoracic aorta without aneurysm. There is moderate atherosclerosis. Conventional branching pattern from the aortic arch. No aortic dissection. Scattered coronary artery calcifications. Heart is normal in size. Mediastinum/Nodes: There is diffuse esophageal wall thickening from the thoracic inlet through the diaphragmatic hiatus. Intraluminal esophageal fluid. No evidence of pneumatosis. No pneumomediastinum. Small amount of paraesophageal fluid distally may be contiguous with right pleural effusion. Prominent right upper paratracheal node measures 10 mm short axis. Small lower paratracheal nodes not enlarged by size criteria. No evidence of hilar adenopathy. No axillary adenopathy. No pericardial effusion. Lungs/Pleura: Moderate bilateral pleural effusions, right greater than left. There is adjacent compressive atelectasis in both lower lobes. Mild patchy opacities in the anterior right upper lobe, left upper lobe to a lesser extent and superior segment left lower lobe. There is minimal smooth septal thickening. Trachea and mainstem bronchi are patent. Musculoskeletal: There are no acute or suspicious osseous abnormalities. CT ABDOMEN PELVIS FINDINGS Hepatobiliary: No focal hepatic lesion.  Streak artifact from arms down positioning. Question of nodular hepatic contours. Postcholecystectomy. No biliary dilatation. Pancreas: Atrophic parenchyma. No ductal dilatation or inflammation. Spleen: Unremarkable. Adrenals/Urinary Tract: No adrenal nodule. Symmetric renal enhancement. No hydronephrosis. Motion artifact through the lower left kidney. Contour deformity is likely secondary to lobular contours and motion, less likely focal renal lesion. Urinary  bladder is physiologically distended. Stomach/Bowel: Diffuse wall thickening and edematous appearance. Small volume of intraluminal gastric fluid. No pneumatosis. Small bowel is decompressed. No definite small bowel thickening. Extensive colonic diverticulosis of the descending and sigmoid colon without acute diverticulitis. Scattered colonic diverticular throughout the remainder the colon. Appendix tentatively identified and normal. There is mild wall thickening at the rectosigmoid junction with adjacent perirectal fat stranding. Vascular/Lymphatic: Abdominal aortic atherosclerosis. No aneurysm. Circumaortic left renal vein. No retroperitoneal adenopathy. Reproductive: Uterus is atrophic, normal for age.  No adnexal mass. Other: Small fat containing upper ventral abdominal wall hernia. Small amount of ascites adjacent to the liver in the spleen. There is no free air. No intra-abdominal abscess. There is whole body wall edema. Musculoskeletal: Degenerative change throughout spine. There are no acute or suspicious osseous abnormalities. IMPRESSION: 1. Esophagus is diffusely edematous with diffuse wall thickening and intraluminal fluid. No pneumatosis or pneumomediastinum. No CT findings to suggest necrosis. Similar edematous appearance of the stomach. 2. Moderate bilateral pleural effusions with adjacent compressive atelectasis. Minimal smooth septal thickening may reflect pulmonary edema. 3. Contour deformity of the lower left kidney likely related to lobular  contours and motion, difficult to exclude exophytic lesion. Nonemergent renal ultrasound could be considered when patient is able. 4. Rather extensive colonic diverticulosis, no diverticulitis. Rectus sigmoid colonic wall thickening with adjacent edema, suggesting chronic constipation. 5. Question of nodular hepatic contours raising concern for cirrhosis. 6. Whole body wall edema.  Minimal abdominal ascites. 7. Thoracoabdominal aortic atherosclerosis. Coronary artery calcifications. Electronically Signed   By: Jeb Levering M.D.   On: 04/17/2016 22:00       Assessment / Plan:    #1 81 yo WF with gastric outlet obstruction secondary to pyloric mass- secondary gastric and esophageal diffuse edema and finding worrisome for early esophageal ischemia (black esophagus) #2 R pneumonia - aspiration related #3 anemia- stable  Pt looks better and parameters all improving  await biopsies- further plans pending BX results Continue NPO today TPN has been started  Continue IV PPI BID Continue IV Unasyn    Principal Problem:   Melena Active Problems:   DM2 (diabetes mellitus, type 2) (HCC)   GERD without esophagitis   Fall at home     LOS: 4 days   Deakon Frix  04/19/2016, 9:04 AM

## 2016-04-19 NOTE — Progress Notes (Signed)
PROGRESS NOTE                                                                                                                                                                                                             Patient Demographics:    [name] [name], is a 81 y.o. female, DOB - [birth year], AP:[id number]  Admit date - 04/15/2016   Admitting Physician Thurnell Lose, MD  Outpatient Primary MD for the patient is [name], MD  LOS - 4  Brief Narrative    81 y.o. female, With history of essential hypertension, diabetes mellitus type 2, dyslipidemia, peptic ulcer disease, poor balance with multiple falls recently started using a walker still lives at home, has been experiencing some dark stools along with generalized weakness.  In the ER patient was found to have heme positive stool, she appeared slightly weak, dehydrated and pale.   Assessment  & Plan :   Acute upper GI bleed with melena and acute blood loss anemia. 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  - CT scan with no evidence of pneumotosis or pneumomediastinum related to the esophagus findings, although she is at risk for this - per GI team, acute esophageal necrosis carries a mortality rate between 15-30% - recommended treatment is supportive care with IVF, NPO, broad spectrum antibiotics, and IV PPI.  - Long term, if she recovers from this, high rate of esophageal stricturing. - further recommendations pending gastric biopsies  - TPN for nutrition   GERD - on PPI   Generalized weakness with multiple falls.  - PT evaluation requested - pt will need SNF  Essential hypertension - reasonable inpatient control   Dehydration with hyponatremia and hypokalemia - improved with IVF and supplementation  - Na  is bit lower this AM but will continue to monitor closely  - Mg is still low, continue to supplement Mg as well - repeat Mg and BMP in AM  Leukocytosis - has been on Unasyn  - resolved   Mild hematemesis with right-sided aspiration pneumonitis on 04/16/2016.  - Currently not hypoxic, cover with Unasyn and monitor. If stable switch to Augmentin for a total of 5 days treatment.  DM type II.  - Continue on sliding scale will  change it to every 6 hours since she is nothing by mouth. - A1C is 6.6   Family Communication  :  Pt at bedside   Code Status :  Full  Diet : NPO   Disposition Plan  :  Stay inpt  Consults  :  GI  Procedures  :  EGD due later today  DVT Prophylaxis  :    SCDs    Inpatient Medications  Scheduled Meds: . ampicillin-sulbactam (UNASYN) IV  1.5 g Intravenous Q6H  . antiseptic oral rinse  7 mL Mouth Rinse q12n4p  . bisacodyl  10 mg Rectal Daily  . chlorhexidine  15 mL Mouth Rinse BID  . erythromycin   Both Eyes QHS  . insulin aspart  0-20 Units Subcutaneous Q4H  . insulin aspart  14 Units Subcutaneous Once  . pantoprazole  40 mg Intravenous Q12H  . sodium chloride flush  10-40 mL Intracatheter Q12H  . sodium chloride flush  3 mL Intravenous Q12H   Continuous Infusions: . 0.45 % NaCl with KCl 20 mEq / L 50 mL/hr at 04/19/16 1013  . Marland KitchenTPN (CLINIMIX-E) Adult 40 mL/hr at 04/18/16 1807   And  . fat emulsion 192 mL (04/18/16 1807)  . Marland KitchenTPN (CLINIMIX-E) Adult     And  . fat emulsion     PRN Meds:.albuterol, hydrALAZINE, [DISCONTINUED] ondansetron **OR** ondansetron (ZOFRAN) IV, sodium chloride flush  Antibiotics  :    Anti-infectives    Start     Dose/Rate Route Frequency Ordered Stop   04/16/16 0900  ampicillin-sulbactam (UNASYN) 1.5 g in sodium chloride 0.9 % 50 mL IVPB     1.5 g 100 mL/hr over 30 Minutes Intravenous Every 6 hours 04/16/16 0806           Objective:   Vitals:   04/19/16 0900 04/19/16 1000 04/19/16 1100 04/19/16 1340  BP:     (!) 131/56  Pulse: 82 82 80 83  Resp: 18 18 17 18   Temp:    97.5 F (36.4 C)  TempSrc:    Oral  SpO2: 100% 99% 100% 99%  Weight:    81.7 kg (180 lb 3.2 oz)  Height:    5\' 2"  (1.575 m)    Wt Readings from Last 3 Encounters:  04/19/16 81.7 kg (180 lb 3.2 oz)  01/20/16 78.5 kg (173 lb)  08/25/15 80.3 kg (177 lb)     Intake/Output Summary (Last 24 hours) at 04/19/16 1606 Last data filed at 04/19/16 1300  Gross per 24 hour  Intake          2417.23 ml  Output             1450 ml  Net           967.23 ml     Physical Exam  Awake , Oriented X 2, No new F.N deficits, Normal affect [place].AT,PERRAL Supple Neck,No JVD, No cervical lymphadenopathy appriciated.  Symmetrical Chest wall movement, Good air movement bilaterally, few R.sided rales RRR,No Gallops,Rubs or new Murmurs, No Parasternal Heave +ve B.Sounds, Abd Soft, No tenderness, No organomegaly appriciated, No rebound - guarding or rigidity. No Cyanosis, Clubbing or edema, No new Rash or bruise       Data Review:    CBC  Recent Labs Lab 04/15/16 1315  04/16/16 0530  04/16/16 1624 04/16/16 2325 04/17/16 0529 04/18/16 1800 04/19/16 0900  WBC 21.5*  --  20.2*  --   --   --  12.2* 8.3 6.4  HGB 12.5  < >  9.3*  < > 8.4* 8.3* 8.4* 8.5* 7.9*  HCT 36.9  < > 27.2*  < > 24.6* 25.3* 25.0* 25.3* 23.3*  PLT 455*  --  295  --   --   --  233 247 221  MCV 86.0  --  86.6  --   --   --  89.3 88.2 88.3  MCH 29.1  --  29.6  --   --   --  30.0 29.6 29.9  MCHC 33.9  --  34.2  --   --   --  33.6 33.6 33.9  RDW 15.7*  --  16.1*  --   --   --  17.1* 17.0* 16.8*  LYMPHSABS 1.3  --   --   --   --   --   --   --  1.0  MONOABS 1.2*  --   --   --   --   --   --   --  0.5  EOSABS 0.0  --   --   --   --   --   --   --  0.1  BASOSABS 0.1  --   --   --   --   --   --   --  0.0  < > = values in this interval not displayed.  Chemistries   Recent Labs Lab 04/15/16 1315 04/16/16 0530 04/17/16 0529 04/18/16 1800 04/19/16 0900  NA 129* 135  135 134* 132*  K 3.3* 3.8 4.7 4.2 3.9  CL 89* 107 111 109 107  CO2 26 24 20* 21* 22  GLUCOSE 433* 94 157* 231* 295*  BUN 35* 27* 22* 13 12  CREATININE 1.11* 0.86 0.85 0.86 0.81  CALCIUM 11.9* 9.9 8.8* 8.1* 8.0*  MG  --   --   --  1.3* 1.4*  AST 21  --   --   --  18  ALT 19  --   --   --  15  ALKPHOS 60  --   --   --  38  BILITOT 1.3*  --   --   --  0.4   ------------------------------------------------------------------------------------------------------------------  Recent Labs  04/19/16 0900  TRIG 126    Lab Results  Component Value Date   HGBA1C 6.6 (H) 04/15/2016   ------------------------------------------------------------------------------------------------------------------ No results for input(s): TSH, T4TOTAL, T3FREE, THYROIDAB in the last 72 hours.  Invalid input(s): FREET3 ------------------------------------------------------------------------------------------------------------------ No results for input(s): VITAMINB12, FOLATE, FERRITIN, TIBC, IRON, RETICCTPCT in the last 72 hours.  Coagulation profile  Recent Labs Lab 04/15/16 1315  INR 1.13     ------------------------------------------------------------------------------------------------------------------ No results found for: BNP  Micro Results Recent Results (from the past 240 hour(s))  MRSA PCR Screening     Status: None   Collection Time: 04/17/16  6:27 PM  Result Value Ref Range Status   MRSA by PCR NEGATIVE NEGATIVE Final    Radiology Reports Dg Chest 2 View  Result Date: 03/29/2016 CLINICAL DATA:  Hypotension and weakness for 1 week. History of diabetes and high blood pressure. EXAM: CHEST  2 VIEW COMPARISON:  Seventeen 17 FINDINGS: Normal heart size and pulmonary vascularity. No focal airspace disease or consolidation in the lungs. No blunting of costophrenic angles. No pneumothorax. Mediastinal contours appear intact. Degenerative changes in the spine and shoulders. Calcification of  the aorta. IMPRESSION: No active cardiopulmonary disease. Electronically Signed   By: Lucienne Capers M.D.   On: 03/29/2016 06:15   Ct Head Wo Contrast  Result Date:  04/15/2016 CLINICAL DATA:  Unwitnessed fall. Fell wall trying again out of bed. Trauma to head without loss of consciousness. Initial encounter. EXAM: CT HEAD WITHOUT CONTRAST CT CERVICAL SPINE WITHOUT CONTRAST TECHNIQUE: Multidetector CT imaging of the head and cervical spine was performed following the standard protocol without intravenous contrast. Multiplanar CT image reconstructions of the cervical spine were also generated. COMPARISON:  CT head without contrast 03/29/2016. CT of the head and face 12/24/2015. FINDINGS: CT HEAD FINDINGS Moderate atrophy and periventricular white matter hypoattenuation is stable. This is slightly advanced for age. The ventricles proportionate to the degree of atrophy. A remote infarct of the right cerebellum is stable. No acute infarct, hemorrhage, or mass lesion is present. No significant extraaxial fluid collection is present. Minimal soft tissue swelling is present in the left occipital sub scalp without an underlying fracture. The calvarium is intact. A remote right inferior orbital floor fracture has healed. The paranasal sinuses and mastoid air cells are clear. CT CERVICAL SPINE FINDINGS The cervical spine is imaged from the skullbase through T2-3. Asymmetric degenerative changes are present at the left atlantooccipital joint. Degenerative changes are present at C1-2. There is chronic loss of disc height from C3 through C7. Vertebral body heights and alignment are maintained. Facet hypertrophy is worse on the right. Soft tissues the neck demonstrate thyroid atrophy. Atherosclerotic calcifications are present at the carotid bifurcations bilaterally without significant stenoses. A large right pleural effusion is present. The lung apices are otherwise clear. IMPRESSION: 1. Stable moderate atrophy and white  matter disease. No acute intracranial abnormality. 2. Minimal soft tissue swelling in the left occipital scalp may be related to the acute trauma. There is no underlying fracture. The 3. Remote right inferior orbital floor fracture has healed. 4. Multilevel degenerative changes in the cervical spine. 5. New moderate-sized right pleural effusion. Recommend two-view chest x-ray for further evaluation. Electronically Signed   By: San Morelle M.D.   On: 04/15/2016 14:40  Ct Head Wo Contrast  Result Date: 03/29/2016 CLINICAL DATA:  Hypoglycemic and hypotensive for about a week. Fell 6 days ago. Weakness since the fall. Confusion, double vision, and slurred speech for 1 day. Unsteady gait. EXAM: CT HEAD WITHOUT CONTRAST TECHNIQUE: Contiguous axial images were obtained from the base of the skull through the vertex without intravenous contrast. COMPARISON:  12/24/2015 FINDINGS: Mild cerebral atrophy. Ventricular dilatation consistent with central atrophy. Low-attenuation changes in the deep white matter consistent with small vessel ischemia. No mass effect or midline shift. No abnormal extra-axial fluid collections. Gray-white matter junctions are distinct. Basal cisterns are not effaced. No evidence of acute intracranial hemorrhage. No depressed skull fractures. Visualized paranasal sinuses and mastoid air cells are not opacified. IMPRESSION: No acute intracranial abnormalities. Chronic atrophy and small vessel ischemic changes. Electronically Signed   By: Lucienne Capers M.D.   On: 03/29/2016 04:26   Ct Chest W Contrast  Result Date: 04/17/2016 CLINICAL DATA:  Black esophagus on EGD today.  Rule out necrosis. EXAM: CT CHEST, ABDOMEN, AND PELVIS WITH CONTRAST TECHNIQUE: Multidetector CT imaging of the chest, abdomen and pelvis was performed following the standard protocol during bolus administration of intravenous contrast. CONTRAST:  185mL ISOVUE-300 IOPAMIDOL (ISOVUE-300) INJECTION 61% COMPARISON:  Chest  radiographs yesterday. No prior chest CT. CT abdomen/pelvis 08/04/2012 FINDINGS: CT CHEST FINDINGS Cardiovascular: Tortuosity of the thoracic aorta without aneurysm. There is moderate atherosclerosis. Conventional branching pattern from the aortic arch. No aortic dissection. Scattered coronary artery calcifications. Heart is normal in size. Mediastinum/Nodes: There is diffuse esophageal  wall thickening from the thoracic inlet through the diaphragmatic hiatus. Intraluminal esophageal fluid. No evidence of pneumatosis. No pneumomediastinum. Small amount of paraesophageal fluid distally may be contiguous with right pleural effusion. Prominent right upper paratracheal node measures 10 mm short axis. Small lower paratracheal nodes not enlarged by size criteria. No evidence of hilar adenopathy. No axillary adenopathy. No pericardial effusion. Lungs/Pleura: Moderate bilateral pleural effusions, right greater than left. There is adjacent compressive atelectasis in both lower lobes. Mild patchy opacities in the anterior right upper lobe, left upper lobe to a lesser extent and superior segment left lower lobe. There is minimal smooth septal thickening. Trachea and mainstem bronchi are patent. Musculoskeletal: There are no acute or suspicious osseous abnormalities. CT ABDOMEN PELVIS FINDINGS Hepatobiliary: No focal hepatic lesion. Streak artifact from arms down positioning. Question of nodular hepatic contours. Postcholecystectomy. No biliary dilatation. Pancreas: Atrophic parenchyma. No ductal dilatation or inflammation. Spleen: Unremarkable. Adrenals/Urinary Tract: No adrenal nodule. Symmetric renal enhancement. No hydronephrosis. Motion artifact through the lower left kidney. Contour deformity is likely secondary to lobular contours and motion, less likely focal renal lesion. Urinary bladder is physiologically distended. Stomach/Bowel: Diffuse wall thickening and edematous appearance. Small volume of intraluminal gastric  fluid. No pneumatosis. Small bowel is decompressed. No definite small bowel thickening. Extensive colonic diverticulosis of the descending and sigmoid colon without acute diverticulitis. Scattered colonic diverticular throughout the remainder the colon. Appendix tentatively identified and normal. There is mild wall thickening at the rectosigmoid junction with adjacent perirectal fat stranding. Vascular/Lymphatic: Abdominal aortic atherosclerosis. No aneurysm. Circumaortic left renal vein. No retroperitoneal adenopathy. Reproductive: Uterus is atrophic, normal for age.  No adnexal mass. Other: Small fat containing upper ventral abdominal wall hernia. Small amount of ascites adjacent to the liver in the spleen. There is no free air. No intra-abdominal abscess. There is whole body wall edema. Musculoskeletal: Degenerative change throughout spine. There are no acute or suspicious osseous abnormalities. IMPRESSION: 1. Esophagus is diffusely edematous with diffuse wall thickening and intraluminal fluid. No pneumatosis or pneumomediastinum. No CT findings to suggest necrosis. Similar edematous appearance of the stomach. 2. Moderate bilateral pleural effusions with adjacent compressive atelectasis. Minimal smooth septal thickening may reflect pulmonary edema. 3. Contour deformity of the lower left kidney likely related to lobular contours and motion, difficult to exclude exophytic lesion. Nonemergent renal ultrasound could be considered when patient is able. 4. Rather extensive colonic diverticulosis, no diverticulitis. Rectus sigmoid colonic wall thickening with adjacent edema, suggesting chronic constipation. 5. Question of nodular hepatic contours raising concern for cirrhosis. 6. Whole body wall edema.  Minimal abdominal ascites. 7. Thoracoabdominal aortic atherosclerosis. Coronary artery calcifications. Electronically Signed   By: Jeb Levering M.D.   On: 04/17/2016 22:00   Ct Cervical Spine Wo Contrast  Result  Date: 04/15/2016 CLINICAL DATA:  Unwitnessed fall. Fell wall trying again out of bed. Trauma to head without loss of consciousness. Initial encounter. EXAM: CT HEAD WITHOUT CONTRAST CT CERVICAL SPINE WITHOUT CONTRAST TECHNIQUE: Multidetector CT imaging of the head and cervical spine was performed following the standard protocol without intravenous contrast. Multiplanar CT image reconstructions of the cervical spine were also generated. COMPARISON:  CT head without contrast 03/29/2016. CT of the head and face 12/24/2015. FINDINGS: CT HEAD FINDINGS Moderate atrophy and periventricular white matter hypoattenuation is stable. This is slightly advanced for age. The ventricles proportionate to the degree of atrophy. A remote infarct of the right cerebellum is stable. No acute infarct, hemorrhage, or mass lesion is present. No significant extraaxial fluid collection  is present. Minimal soft tissue swelling is present in the left occipital sub scalp without an underlying fracture. The calvarium is intact. A remote right inferior orbital floor fracture has healed. The paranasal sinuses and mastoid air cells are clear. CT CERVICAL SPINE FINDINGS The cervical spine is imaged from the skullbase through T2-3. Asymmetric degenerative changes are present at the left atlantooccipital joint. Degenerative changes are present at C1-2. There is chronic loss of disc height from C3 through C7. Vertebral body heights and alignment are maintained. Facet hypertrophy is worse on the right. Soft tissues the neck demonstrate thyroid atrophy. Atherosclerotic calcifications are present at the carotid bifurcations bilaterally without significant stenoses. A large right pleural effusion is present. The lung apices are otherwise clear. IMPRESSION: 1. Stable moderate atrophy and white matter disease. No acute intracranial abnormality. 2. Minimal soft tissue swelling in the left occipital scalp may be related to the acute trauma. There is no  underlying fracture. The 3. Remote right inferior orbital floor fracture has healed. 4. Multilevel degenerative changes in the cervical spine. 5. New moderate-sized right pleural effusion. Recommend two-view chest x-ray for further evaluation. Electronically Signed   By: San Morelle M.D.   On: 04/15/2016 14:40  Ct Abdomen Pelvis W Contrast  Result Date: 04/17/2016 CLINICAL DATA:  Black esophagus on EGD today.  Rule out necrosis. EXAM: CT CHEST, ABDOMEN, AND PELVIS WITH CONTRAST TECHNIQUE: Multidetector CT imaging of the chest, abdomen and pelvis was performed following the standard protocol during bolus administration of intravenous contrast. CONTRAST:  144mL ISOVUE-300 IOPAMIDOL (ISOVUE-300) INJECTION 61% COMPARISON:  Chest radiographs yesterday. No prior chest CT. CT abdomen/pelvis 08/04/2012 FINDINGS: CT CHEST FINDINGS Cardiovascular: Tortuosity of the thoracic aorta without aneurysm. There is moderate atherosclerosis. Conventional branching pattern from the aortic arch. No aortic dissection. Scattered coronary artery calcifications. Heart is normal in size. Mediastinum/Nodes: There is diffuse esophageal wall thickening from the thoracic inlet through the diaphragmatic hiatus. Intraluminal esophageal fluid. No evidence of pneumatosis. No pneumomediastinum. Small amount of paraesophageal fluid distally may be contiguous with right pleural effusion. Prominent right upper paratracheal node measures 10 mm short axis. Small lower paratracheal nodes not enlarged by size criteria. No evidence of hilar adenopathy. No axillary adenopathy. No pericardial effusion. Lungs/Pleura: Moderate bilateral pleural effusions, right greater than left. There is adjacent compressive atelectasis in both lower lobes. Mild patchy opacities in the anterior right upper lobe, left upper lobe to a lesser extent and superior segment left lower lobe. There is minimal smooth septal thickening. Trachea and mainstem bronchi are patent.  Musculoskeletal: There are no acute or suspicious osseous abnormalities. CT ABDOMEN PELVIS FINDINGS Hepatobiliary: No focal hepatic lesion. Streak artifact from arms down positioning. Question of nodular hepatic contours. Postcholecystectomy. No biliary dilatation. Pancreas: Atrophic parenchyma. No ductal dilatation or inflammation. Spleen: Unremarkable. Adrenals/Urinary Tract: No adrenal nodule. Symmetric renal enhancement. No hydronephrosis. Motion artifact through the lower left kidney. Contour deformity is likely secondary to lobular contours and motion, less likely focal renal lesion. Urinary bladder is physiologically distended. Stomach/Bowel: Diffuse wall thickening and edematous appearance. Small volume of intraluminal gastric fluid. No pneumatosis. Small bowel is decompressed. No definite small bowel thickening. Extensive colonic diverticulosis of the descending and sigmoid colon without acute diverticulitis. Scattered colonic diverticular throughout the remainder the colon. Appendix tentatively identified and normal. There is mild wall thickening at the rectosigmoid junction with adjacent perirectal fat stranding. Vascular/Lymphatic: Abdominal aortic atherosclerosis. No aneurysm. Circumaortic left renal vein. No retroperitoneal adenopathy. Reproductive: Uterus is atrophic, normal for age.  No  adnexal mass. Other: Small fat containing upper ventral abdominal wall hernia. Small amount of ascites adjacent to the liver in the spleen. There is no free air. No intra-abdominal abscess. There is whole body wall edema. Musculoskeletal: Degenerative change throughout spine. There are no acute or suspicious osseous abnormalities. IMPRESSION: 1. Esophagus is diffusely edematous with diffuse wall thickening and intraluminal fluid. No pneumatosis or pneumomediastinum. No CT findings to suggest necrosis. Similar edematous appearance of the stomach. 2. Moderate bilateral pleural effusions with adjacent compressive  atelectasis. Minimal smooth septal thickening may reflect pulmonary edema. 3. Contour deformity of the lower left kidney likely related to lobular contours and motion, difficult to exclude exophytic lesion. Nonemergent renal ultrasound could be considered when patient is able. 4. Rather extensive colonic diverticulosis, no diverticulitis. Rectus sigmoid colonic wall thickening with adjacent edema, suggesting chronic constipation. 5. Question of nodular hepatic contours raising concern for cirrhosis. 6. Whole body wall edema.  Minimal abdominal ascites. 7. Thoracoabdominal aortic atherosclerosis. Coronary artery calcifications. Electronically Signed   By: Jeb Levering M.D.   On: 04/17/2016 22:00   Dg Chest Port 1 View  Result Date: 04/16/2016 CLINICAL DATA:  Increased cough and congestion this morning. EXAM: PORTABLE CHEST 1 VIEW COMPARISON:  Chest x-rays dated 04/15/2016 and 03/29/2016. FINDINGS: Today's exam is slightly hypoinspiratory with crowding of the perihilar bronchovascular markings. New patchy airspace opacities are seen at the right lung base. Left lung remains clear. Cardiomediastinal silhouette is stable. Atherosclerotic changes noted at the aortic arch. Osseous structures about the chest are unremarkable. IMPRESSION: Low lung volumes. New patchy airspace opacities at the right lung base, atelectasis versus early developing pneumonia. Aortic atherosclerosis. Electronically Signed   By: Franki Cabot M.D.   On: 04/16/2016 08:16  Dg Chest Port 1 View  Result Date: 04/15/2016 CLINICAL DATA:  Unwitnessed fall today.  Pleural effusion. EXAM: PORTABLE CHEST 1 VIEW COMPARISON:  03/29/2016 FINDINGS: Heart is borderline in size. Mild vascular congestion. Bibasilar atelectasis. No visible effusions or pneumothorax. No acute bony abnormality. IMPRESSION: Borderline heart size with vascular congestion. Bibasilar atelectasis. Electronically Signed   By: Rolm Baptise M.D.   On: 04/15/2016 15:19  Dg  Chest Portable 1 View  Result Date: 03/26/2016 CLINICAL DATA:  Weakness, hypotension EXAM: PORTABLE CHEST 1 VIEW COMPARISON:  06/03/2008 FINDINGS: Cardiomediastinal silhouette is stable. No acute infiltrate or pleural effusion. No pulmonary edema. Mild degenerative changes thoracic spine. Degenerative changes bilateral shoulders. IMPRESSION: No active disease. Electronically Signed   By: Lahoma Crocker M.D.   On: 03/26/2016 15:22   Dg Abd Portable 1v  Result Date: 04/16/2016 CLINICAL DATA:  Melena. Ongoing heartburn. Pelvic pain since this morning. EXAM: PORTABLE ABDOMEN - 1 VIEW COMPARISON:  None FINDINGS: Moderate stool burden throughout the colon. Nonobstructive bowel gas pattern. No free air, organomegaly or suspicious calcification. Degenerative changes in the lumbar spine and hips. No acute bony abnormality. IMPRESSION: Moderate stool burden.  No acute findings. Electronically Signed   By: Rolm Baptise M.D.   On: 04/16/2016 09:30   Time Spent in minutes  30  MAGICK-Gerlad Pelzel M.D on 04/19/2016 at 4:06 PM  Between 7am to 7pm - Pager - [phone]  After 7pm go to [email or web address] - password [facility]  [facility] -  Office  [phone]

## 2016-04-20 DIAGNOSIS — Y92099 Unspecified place in other non-institutional residence as the place of occurrence of the external cause: Secondary | ICD-10-CM

## 2016-04-20 DIAGNOSIS — K311 Adult hypertrophic pyloric stenosis: Secondary | ICD-10-CM | POA: Diagnosis present

## 2016-04-20 DIAGNOSIS — D490 Neoplasm of unspecified behavior of digestive system: Secondary | ICD-10-CM

## 2016-04-20 LAB — GLUCOSE, CAPILLARY
GLUCOSE-CAPILLARY: 123 mg/dL — AB (ref 65–99)
GLUCOSE-CAPILLARY: 134 mg/dL — AB (ref 65–99)
GLUCOSE-CAPILLARY: 143 mg/dL — AB (ref 65–99)
GLUCOSE-CAPILLARY: 158 mg/dL — AB (ref 65–99)
GLUCOSE-CAPILLARY: 194 mg/dL — AB (ref 65–99)
Glucose-Capillary: 122 mg/dL — ABNORMAL HIGH (ref 65–99)

## 2016-04-20 LAB — CBC
HCT: 25.2 % — ABNORMAL LOW (ref 36.0–46.0)
Hemoglobin: 8.3 g/dL — ABNORMAL LOW (ref 12.0–15.0)
MCH: 28.6 pg (ref 26.0–34.0)
MCHC: 32.9 g/dL (ref 30.0–36.0)
MCV: 86.9 fL (ref 78.0–100.0)
PLATELETS: 220 10*3/uL (ref 150–400)
RBC: 2.9 MIL/uL — ABNORMAL LOW (ref 3.87–5.11)
RDW: 16.4 % — AB (ref 11.5–15.5)
WBC: 6.4 10*3/uL (ref 4.0–10.5)

## 2016-04-20 LAB — BASIC METABOLIC PANEL
Anion gap: 5 (ref 5–15)
BUN: 12 mg/dL (ref 6–20)
CALCIUM: 7.9 mg/dL — AB (ref 8.9–10.3)
CO2: 23 mmol/L (ref 22–32)
CREATININE: 0.7 mg/dL (ref 0.44–1.00)
Chloride: 108 mmol/L (ref 101–111)
GFR calc non Af Amer: >60 mL/min (ref >60)
Glucose, Bld: 123 mg/dL — ABNORMAL HIGH (ref 65–99)
Potassium: 3.5 mmol/L (ref 3.5–5.1)
SODIUM: 136 mmol/L (ref 135–145)

## 2016-04-20 LAB — MAGNESIUM: MAGNESIUM: 1.9 mg/dL (ref 1.7–2.4)

## 2016-04-20 MED ORDER — FLUCONAZOLE IN SODIUM CHLORIDE 200-0.9 MG/100ML-% IV SOLN
200.0000 mg | INTRAVENOUS | Status: DC
Start: 2016-04-20 — End: 2016-04-27
  Administered 2016-04-20 – 2016-04-27 (×8): 200 mg via INTRAVENOUS
  Filled 2016-04-20 (×8): qty 100

## 2016-04-20 MED ORDER — TRACE MINERALS CR-CU-MN-SE-ZN 10-1000-500-60 MCG/ML IV SOLN
INTRAVENOUS | Status: AC
  Administered 2016-04-20: 17:00:00 via INTRAVENOUS
  Filled 2016-04-20: qty 1440

## 2016-04-20 MED ORDER — FAT EMULSION 20 % IV EMUL
192.0000 mL | INTRAVENOUS | Status: AC
  Administered 2016-04-20: 192 mL via INTRAVENOUS
  Filled 2016-04-20: qty 200

## 2016-04-20 NOTE — Progress Notes (Signed)
Nutrition Follow-up  DOCUMENTATION CODES:   Not applicable  INTERVENTION:  - TPN per pharmacy. - RD will follow-up 8/7.  NUTRITION DIAGNOSIS:   Inadequate oral intake related to inability to eat as evidenced by NPO status. -ongoing   GOAL:   Patient will meet greater than or equal to 90% of their needs -will be met with TPN at goal rate.   MONITOR:   Diet advancement, Weight trends, Labs, I & O's, Other (Comment) (TPN regimen)  REASON FOR ASSESSMENT:   New TPN/TNA  ASSESSMENT:   [name] [name]  is a 81 y.o. female, With history of essential hypertension, diabetes mellitus type 2, dyslipidemia, peptic ulcer disease, poor balance with multiple falls recently started using a walker still lives at home whose been experiencing some dark stools for the last few days along with generalized weakness.  8/4 Pt on CLD 7/31-8/1 at which time she was made NPO. TPN initiated yesterday d/t gastric outlet obstruction and is s/p biopsy. Pt does not have NGT. GI note this AM states: Esophageal, gastric and duodenal bx - no maligancy, no hpylori-esophagus with ulcerated mucosa, necrotic debri and fungi  Pt currently receiving Clinimix E 5/15 @ 40 mL/hr with 20% lipids @ 10 mL/hr. Per pharmacy note this AM, plan to increase TPN to goal rate today at 1800: Clinimix E 5/15 @ 60 mL/hr with 20% lipids @ 10 mL/hr and 8 units insulin in TPN bag. This regimen will provide 1502 kcal (107% estimated kcal need), 72 grams of protein (100% estimated protein need).   Per chart review, pt weight trending up since admission and CBW is +7.3 kg from admission weight. RD will follow-up 8/7; will continue to monitor for ability for diet advancement.   Medications reviewed; sliding scale Novolog, PRN Zofran, 40 mg Protonix BID, 30 mmol NaPhos x1 dose yesterday. Labs reviewed; CBGs: 122-158 mg/dL today, Ca: 7.9 mg/dL, Phos: 1.8 mg/dL yesterday, LFTs WDL, triglycerides WDL (126 mg/dL). IVF: 1/2 NS-20 mEq KCl @ 50  mL/hr.    8/3 Pt is now NPO on TPN Wt is up 5kg since admission. No nausea/vomiting Had a BM yesterday. 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 Awaiting gastric biopsies.   Diet Order:  Diet NPO time specified TPN (CLINIMIX-E) Adult .TPN (CLINIMIX-E) Adult  Skin:  Reviewed, no issues  Last BM:  8/3  Height:   Ht Readings from Last 1 Encounters:  04/19/16 '5\' 2"'$  (1.575 m)    Weight:   Wt Readings from Last 1 Encounters:  04/20/16 173 lb (78.5 kg)    Ideal Body Weight:  50 kg  BMI:  Body mass index is 31.64 kg/m.  Estimated Nutritional Needs:   Kcal:  1250-1400 calories  Protein:  70-85 grams  Fluid:  >/= 1.25L  EDUCATION NEEDS:   No education needs identified at this time    Jarome Matin, MS, RD, LDN Inpatient Clinical Dietitian Pager # [phone] After hours/weekend pager # [phone]

## 2016-04-20 NOTE — Progress Notes (Signed)
PROGRESS NOTE                                                                                                                                                                                                             Patient Demographics:    [name] [name], is a 81 y.o. female, DOB - [birth year], AP:[id number]  Admit date - 04/15/2016   Admitting Physician Thurnell Lose, MD  Outpatient Primary MD for the patient is [name], MD  LOS - 5  Brief Narrative    81 y.o. female, With history of essential hypertension, diabetes mellitus type 2, dyslipidemia, peptic ulcer disease, poor balance with multiple falls recently started using a walker still lives at home, has been experiencing some dark stools along with generalized weakness.  In the ER patient was found to have heme positive stool, she appeared slightly weak, dehydrated and pale.   Assessment  & Plan :   Acute upper GI bleed with melena and acute blood loss anemia. 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  - CT scan with no evidence of pneumotosis or pneumomediastinum related to the esophagus findings, although she is at risk for this - per GI team, acute esophageal necrosis carries a mortality rate between 15-30% - recommended treatment is supportive care with IVF, broad spectrum antibiotics, and IV PPI.  - Long term, if she recovers from this, high rate of esophageal stricturing. - Biopsies of pylorus do no show any malignant cells, esophageal  biopsies with acute inflammation, necrotic debri and fungal forms, ? Need for repeat EGD per GI team  - TPN for nutrition day #3 - diflucan added per GI team   GERD - on PPI   Generalized weakness with multiple falls.  - PT evaluation requested - pt will need  SNF  Essential hypertension - reasonable inpatient control   Dehydration with hyponatremia and hypokalemia - improved with IVF and supplementation  - Mg is now WNL - repeat Mg and BMP in AM  Leukocytosis - has been on Unasyn  - resolved   Mild hematemesis with right-sided aspiration pneumonitis on 04/16/2016.  - Currently not hypoxic - continue to cover with Unasyn and monitor, plan total duration of of ABX for 7 days  DM type II.  - Continue on sliding scale will change it to every 6 hours since she is nothing by mouth. - A1C is 6.6  Family Communication  :  Pt at bedside   Code Status :  Full  Diet : NPO   Disposition Plan  :  Stay inpt  Consults  :  GI  Procedures  :  EGD due later today  DVT Prophylaxis  :    SCDs    Inpatient Medications  Scheduled Meds: . ampicillin-sulbactam (UNASYN) IV  1.5 g Intravenous Q6H  . antiseptic oral rinse  7 mL Mouth Rinse q12n4p  . bisacodyl  10 mg Rectal Daily  . chlorhexidine  15 mL Mouth Rinse BID  . erythromycin   Both Eyes QHS  . fluconazole (DIFLUCAN) IV  200 mg Intravenous Q24H  . insulin aspart  0-20 Units Subcutaneous Q4H  . insulin aspart  14 Units Subcutaneous Once  . pantoprazole  40 mg Intravenous Q12H  . sodium chloride flush  10-40 mL Intracatheter Q12H  . sodium chloride flush  3 mL Intravenous Q12H   Continuous Infusions: . Marland KitchenTPN (CLINIMIX-E) Adult     And  . fat emulsion    . 0.45 % NaCl with KCl 20 mEq / L 50 mL/hr at 04/20/16 0923  . Marland KitchenTPN (CLINIMIX-E) Adult 40 mL/hr at 04/19/16 1718   And  . fat emulsion 192 mL (04/19/16 1718)   PRN Meds:.albuterol, hydrALAZINE, [DISCONTINUED] ondansetron **OR** ondansetron (ZOFRAN) IV, sodium chloride flush  Antibiotics  :    Anti-infectives    Start     Dose/Rate Route Frequency Ordered Stop   04/20/16 1000  fluconazole (DIFLUCAN) IVPB 200 mg     200 mg 100 mL/hr over 60 Minutes Intravenous Every 24 hours 04/20/16 0929     04/16/16 0900   ampicillin-sulbactam (UNASYN) 1.5 g in sodium chloride 0.9 % 50 mL IVPB     1.5 g 100 mL/hr over 30 Minutes Intravenous Every 6 hours 04/16/16 0806           Objective:   Vitals:   04/19/16 2114 04/20/16 0500 04/20/16 0508 04/20/16 1258  BP: 139/63  (!) 120/52 (!) 107/51  Pulse: 78  80 84  Resp: 18  18 18   Temp: 98.2 F (36.8 C)  98 F (36.7 C) 97.5 F (36.4 C)  TempSrc: Axillary  Axillary Oral  SpO2: 96%  99% 100%  Weight:  78.5 kg (173 lb)    Height:        Wt Readings from Last 3 Encounters:  04/20/16 78.5 kg (173 lb)  01/20/16 78.5 kg (173 lb)  08/25/15 80.3 kg (177 lb)     Intake/Output Summary (Last 24 hours) at 04/20/16 1608 Last data filed at 04/20/16 1552  Gross per 24 hour  Intake            807.6 ml  Output              100 ml  Net            707.6 ml     Physical Exam  Awake , Oriented X 2, No new F.N deficits, Normal affect [place].AT,PERRAL Supple Neck,No JVD, No cervical lymphadenopathy appriciated.  Symmetrical Chest wall movement, Good air movement bilaterally, few R.sided rales RRR,No Gallops,Rubs or new Murmurs, No Parasternal Heave +ve B.Sounds, Abd Soft, No tenderness, No organomegaly appriciated, No rebound - guarding or rigidity. No Cyanosis, Clubbing or edema, No new Rash or bruise  Data Review:    CBC  Recent Labs Lab 04/15/16 1315  04/16/16 0530  04/16/16 2325 04/17/16 0529 04/18/16 1800 04/19/16 0900 04/20/16 0609  WBC 21.5*  --  20.2*  --   --  12.2* 8.3 6.4 6.4  HGB 12.5  < > 9.3*  < > 8.3* 8.4* 8.5* 7.9* 8.3*  HCT 36.9  < > 27.2*  < > 25.3* 25.0* 25.3* 23.3* 25.2*  PLT 455*  --  295  --   --  233 247 221 220  MCV 86.0  --  86.6  --   --  89.3 88.2 88.3 86.9  MCH 29.1  --  29.6  --   --  30.0 29.6 29.9 28.6  MCHC 33.9  --  34.2  --   --  33.6 33.6 33.9 32.9  RDW 15.7*  --  16.1*  --   --  17.1* 17.0* 16.8* 16.4*  LYMPHSABS 1.3  --   --   --   --   --   --  1.0  --   MONOABS 1.2*  --   --   --   --   --   --  0.5   --   EOSABS 0.0  --   --   --   --   --   --  0.1  --   BASOSABS 0.1  --   --   --   --   --   --  0.0  --   < > = values in this interval not displayed.  Chemistries   Recent Labs Lab 04/15/16 1315 04/16/16 0530 04/17/16 0529 04/18/16 1800 04/19/16 0900 04/20/16 0609  NA 129* 135 135 134* 132* 136  K 3.3* 3.8 4.7 4.2 3.9 3.5  CL 89* 107 111 109 107 108  CO2 26 24 20* 21* 22 23  GLUCOSE 433* 94 157* 231* 295* 123*  BUN 35* 27* 22* 13 12 12   CREATININE 1.11* 0.86 0.85 0.86 0.81 0.70  CALCIUM 11.9* 9.9 8.8* 8.1* 8.0* 7.9*  MG  --   --   --  1.3* 1.4* 1.9  AST 21  --   --   --  18  --   ALT 19  --   --   --  15  --   ALKPHOS 60  --   --   --  38  --   BILITOT 1.3*  --   --   --  0.4  --    ------------------------------------------------------------------------------------------------------------------  Recent Labs  04/19/16 0900  TRIG 126    Lab Results  Component Value Date   HGBA1C 6.6 (H) 04/15/2016   ------------------------------------------------------------------------------------------------------------------ No results for input(s): TSH, T4TOTAL, T3FREE, THYROIDAB in the last 72 hours.  Invalid input(s): FREET3 ------------------------------------------------------------------------------------------------------------------ No results for input(s): VITAMINB12, FOLATE, FERRITIN, TIBC, IRON, RETICCTPCT in the last 72 hours.  Coagulation profile  Recent Labs Lab 04/15/16 1315  INR 1.13     ------------------------------------------------------------------------------------------------------------------ No results found for: BNP  Micro Results Recent Results (from the past 240 hour(s))  MRSA PCR Screening     Status: None   Collection Time: 04/17/16  6:27 PM  Result Value Ref Range Status   MRSA by PCR NEGATIVE NEGATIVE Final    Radiology Reports Dg Chest 2 View  Result Date: 03/29/2016 CLINICAL DATA:  Hypotension and weakness for 1 week.  History of diabetes and high blood pressure. EXAM: CHEST  2 VIEW COMPARISON:  Seventeen 17 FINDINGS: Normal heart size  and pulmonary vascularity. No focal airspace disease or consolidation in the lungs. No blunting of costophrenic angles. No pneumothorax. Mediastinal contours appear intact. Degenerative changes in the spine and shoulders. Calcification of the aorta. IMPRESSION: No active cardiopulmonary disease. Electronically Signed   By: Lucienne Capers M.D.   On: 03/29/2016 06:15   Ct Head Wo Contrast  Result Date: 04/15/2016 CLINICAL DATA:  Unwitnessed fall. Fell wall trying again out of bed. Trauma to head without loss of consciousness. Initial encounter. EXAM: CT HEAD WITHOUT CONTRAST CT CERVICAL SPINE WITHOUT CONTRAST TECHNIQUE: Multidetector CT imaging of the head and cervical spine was performed following the standard protocol without intravenous contrast. Multiplanar CT image reconstructions of the cervical spine were also generated. COMPARISON:  CT head without contrast 03/29/2016. CT of the head and face 12/24/2015. FINDINGS: CT HEAD FINDINGS Moderate atrophy and periventricular white matter hypoattenuation is stable. This is slightly advanced for age. The ventricles proportionate to the degree of atrophy. A remote infarct of the right cerebellum is stable. No acute infarct, hemorrhage, or mass lesion is present. No significant extraaxial fluid collection is present. Minimal soft tissue swelling is present in the left occipital sub scalp without an underlying fracture. The calvarium is intact. A remote right inferior orbital floor fracture has healed. The paranasal sinuses and mastoid air cells are clear. CT CERVICAL SPINE FINDINGS The cervical spine is imaged from the skullbase through T2-3. Asymmetric degenerative changes are present at the left atlantooccipital joint. Degenerative changes are present at C1-2. There is chronic loss of disc height from C3 through C7. Vertebral body heights and  alignment are maintained. Facet hypertrophy is worse on the right. Soft tissues the neck demonstrate thyroid atrophy. Atherosclerotic calcifications are present at the carotid bifurcations bilaterally without significant stenoses. A large right pleural effusion is present. The lung apices are otherwise clear. IMPRESSION: 1. Stable moderate atrophy and white matter disease. No acute intracranial abnormality. 2. Minimal soft tissue swelling in the left occipital scalp may be related to the acute trauma. There is no underlying fracture. The 3. Remote right inferior orbital floor fracture has healed. 4. Multilevel degenerative changes in the cervical spine. 5. New moderate-sized right pleural effusion. Recommend two-view chest x-ray for further evaluation. Electronically Signed   By: San Morelle M.D.   On: 04/15/2016 14:40  Ct Head Wo Contrast  Result Date: 03/29/2016 CLINICAL DATA:  Hypoglycemic and hypotensive for about a week. Fell 6 days ago. Weakness since the fall. Confusion, double vision, and slurred speech for 1 day. Unsteady gait. EXAM: CT HEAD WITHOUT CONTRAST TECHNIQUE: Contiguous axial images were obtained from the base of the skull through the vertex without intravenous contrast. COMPARISON:  12/24/2015 FINDINGS: Mild cerebral atrophy. Ventricular dilatation consistent with central atrophy. Low-attenuation changes in the deep white matter consistent with small vessel ischemia. No mass effect or midline shift. No abnormal extra-axial fluid collections. Gray-white matter junctions are distinct. Basal cisterns are not effaced. No evidence of acute intracranial hemorrhage. No depressed skull fractures. Visualized paranasal sinuses and mastoid air cells are not opacified. IMPRESSION: No acute intracranial abnormalities. Chronic atrophy and small vessel ischemic changes. Electronically Signed   By: Lucienne Capers M.D.   On: 03/29/2016 04:26   Ct Chest W Contrast  Result Date:  04/17/2016 CLINICAL DATA:  Black esophagus on EGD today.  Rule out necrosis. EXAM: CT CHEST, ABDOMEN, AND PELVIS WITH CONTRAST TECHNIQUE: Multidetector CT imaging of the chest, abdomen and pelvis was performed following the standard protocol during bolus administration of  intravenous contrast. CONTRAST:  155mL ISOVUE-300 IOPAMIDOL (ISOVUE-300) INJECTION 61% COMPARISON:  Chest radiographs yesterday. No prior chest CT. CT abdomen/pelvis 08/04/2012 FINDINGS: CT CHEST FINDINGS Cardiovascular: Tortuosity of the thoracic aorta without aneurysm. There is moderate atherosclerosis. Conventional branching pattern from the aortic arch. No aortic dissection. Scattered coronary artery calcifications. Heart is normal in size. Mediastinum/Nodes: There is diffuse esophageal wall thickening from the thoracic inlet through the diaphragmatic hiatus. Intraluminal esophageal fluid. No evidence of pneumatosis. No pneumomediastinum. Small amount of paraesophageal fluid distally may be contiguous with right pleural effusion. Prominent right upper paratracheal node measures 10 mm short axis. Small lower paratracheal nodes not enlarged by size criteria. No evidence of hilar adenopathy. No axillary adenopathy. No pericardial effusion. Lungs/Pleura: Moderate bilateral pleural effusions, right greater than left. There is adjacent compressive atelectasis in both lower lobes. Mild patchy opacities in the anterior right upper lobe, left upper lobe to a lesser extent and superior segment left lower lobe. There is minimal smooth septal thickening. Trachea and mainstem bronchi are patent. Musculoskeletal: There are no acute or suspicious osseous abnormalities. CT ABDOMEN PELVIS FINDINGS Hepatobiliary: No focal hepatic lesion. Streak artifact from arms down positioning. Question of nodular hepatic contours. Postcholecystectomy. No biliary dilatation. Pancreas: Atrophic parenchyma. No ductal dilatation or inflammation. Spleen: Unremarkable.  Adrenals/Urinary Tract: No adrenal nodule. Symmetric renal enhancement. No hydronephrosis. Motion artifact through the lower left kidney. Contour deformity is likely secondary to lobular contours and motion, less likely focal renal lesion. Urinary bladder is physiologically distended. Stomach/Bowel: Diffuse wall thickening and edematous appearance. Small volume of intraluminal gastric fluid. No pneumatosis. Small bowel is decompressed. No definite small bowel thickening. Extensive colonic diverticulosis of the descending and sigmoid colon without acute diverticulitis. Scattered colonic diverticular throughout the remainder the colon. Appendix tentatively identified and normal. There is mild wall thickening at the rectosigmoid junction with adjacent perirectal fat stranding. Vascular/Lymphatic: Abdominal aortic atherosclerosis. No aneurysm. Circumaortic left renal vein. No retroperitoneal adenopathy. Reproductive: Uterus is atrophic, normal for age.  No adnexal mass. Other: Small fat containing upper ventral abdominal wall hernia. Small amount of ascites adjacent to the liver in the spleen. There is no free air. No intra-abdominal abscess. There is whole body wall edema. Musculoskeletal: Degenerative change throughout spine. There are no acute or suspicious osseous abnormalities. IMPRESSION: 1. Esophagus is diffusely edematous with diffuse wall thickening and intraluminal fluid. No pneumatosis or pneumomediastinum. No CT findings to suggest necrosis. Similar edematous appearance of the stomach. 2. Moderate bilateral pleural effusions with adjacent compressive atelectasis. Minimal smooth septal thickening may reflect pulmonary edema. 3. Contour deformity of the lower left kidney likely related to lobular contours and motion, difficult to exclude exophytic lesion. Nonemergent renal ultrasound could be considered when patient is able. 4. Rather extensive colonic diverticulosis, no diverticulitis. Rectus sigmoid colonic  wall thickening with adjacent edema, suggesting chronic constipation. 5. Question of nodular hepatic contours raising concern for cirrhosis. 6. Whole body wall edema.  Minimal abdominal ascites. 7. Thoracoabdominal aortic atherosclerosis. Coronary artery calcifications. Electronically Signed   By: Jeb Levering M.D.   On: 04/17/2016 22:00   Ct Cervical Spine Wo Contrast  Result Date: 04/15/2016 CLINICAL DATA:  Unwitnessed fall. Fell wall trying again out of bed. Trauma to head without loss of consciousness. Initial encounter. EXAM: CT HEAD WITHOUT CONTRAST CT CERVICAL SPINE WITHOUT CONTRAST TECHNIQUE: Multidetector CT imaging of the head and cervical spine was performed following the standard protocol without intravenous contrast. Multiplanar CT image reconstructions of the cervical spine were also generated. COMPARISON:  CT head without contrast 03/29/2016. CT of the head and face 12/24/2015. FINDINGS: CT HEAD FINDINGS Moderate atrophy and periventricular white matter hypoattenuation is stable. This is slightly advanced for age. The ventricles proportionate to the degree of atrophy. A remote infarct of the right cerebellum is stable. No acute infarct, hemorrhage, or mass lesion is present. No significant extraaxial fluid collection is present. Minimal soft tissue swelling is present in the left occipital sub scalp without an underlying fracture. The calvarium is intact. A remote right inferior orbital floor fracture has healed. The paranasal sinuses and mastoid air cells are clear. CT CERVICAL SPINE FINDINGS The cervical spine is imaged from the skullbase through T2-3. Asymmetric degenerative changes are present at the left atlantooccipital joint. Degenerative changes are present at C1-2. There is chronic loss of disc height from C3 through C7. Vertebral body heights and alignment are maintained. Facet hypertrophy is worse on the right. Soft tissues the neck demonstrate thyroid atrophy. Atherosclerotic  calcifications are present at the carotid bifurcations bilaterally without significant stenoses. A large right pleural effusion is present. The lung apices are otherwise clear. IMPRESSION: 1. Stable moderate atrophy and white matter disease. No acute intracranial abnormality. 2. Minimal soft tissue swelling in the left occipital scalp may be related to the acute trauma. There is no underlying fracture. The 3. Remote right inferior orbital floor fracture has healed. 4. Multilevel degenerative changes in the cervical spine. 5. New moderate-sized right pleural effusion. Recommend two-view chest x-ray for further evaluation. Electronically Signed   By: San Morelle M.D.   On: 04/15/2016 14:40  Ct Abdomen Pelvis W Contrast  Result Date: 04/17/2016 CLINICAL DATA:  Black esophagus on EGD today.  Rule out necrosis. EXAM: CT CHEST, ABDOMEN, AND PELVIS WITH CONTRAST TECHNIQUE: Multidetector CT imaging of the chest, abdomen and pelvis was performed following the standard protocol during bolus administration of intravenous contrast. CONTRAST:  114mL ISOVUE-300 IOPAMIDOL (ISOVUE-300) INJECTION 61% COMPARISON:  Chest radiographs yesterday. No prior chest CT. CT abdomen/pelvis 08/04/2012 FINDINGS: CT CHEST FINDINGS Cardiovascular: Tortuosity of the thoracic aorta without aneurysm. There is moderate atherosclerosis. Conventional branching pattern from the aortic arch. No aortic dissection. Scattered coronary artery calcifications. Heart is normal in size. Mediastinum/Nodes: There is diffuse esophageal wall thickening from the thoracic inlet through the diaphragmatic hiatus. Intraluminal esophageal fluid. No evidence of pneumatosis. No pneumomediastinum. Small amount of paraesophageal fluid distally may be contiguous with right pleural effusion. Prominent right upper paratracheal node measures 10 mm short axis. Small lower paratracheal nodes not enlarged by size criteria. No evidence of hilar adenopathy. No axillary  adenopathy. No pericardial effusion. Lungs/Pleura: Moderate bilateral pleural effusions, right greater than left. There is adjacent compressive atelectasis in both lower lobes. Mild patchy opacities in the anterior right upper lobe, left upper lobe to a lesser extent and superior segment left lower lobe. There is minimal smooth septal thickening. Trachea and mainstem bronchi are patent. Musculoskeletal: There are no acute or suspicious osseous abnormalities. CT ABDOMEN PELVIS FINDINGS Hepatobiliary: No focal hepatic lesion. Streak artifact from arms down positioning. Question of nodular hepatic contours. Postcholecystectomy. No biliary dilatation. Pancreas: Atrophic parenchyma. No ductal dilatation or inflammation. Spleen: Unremarkable. Adrenals/Urinary Tract: No adrenal nodule. Symmetric renal enhancement. No hydronephrosis. Motion artifact through the lower left kidney. Contour deformity is likely secondary to lobular contours and motion, less likely focal renal lesion. Urinary bladder is physiologically distended. Stomach/Bowel: Diffuse wall thickening and edematous appearance. Small volume of intraluminal gastric fluid. No pneumatosis. Small bowel is decompressed. No definite small bowel  thickening. Extensive colonic diverticulosis of the descending and sigmoid colon without acute diverticulitis. Scattered colonic diverticular throughout the remainder the colon. Appendix tentatively identified and normal. There is mild wall thickening at the rectosigmoid junction with adjacent perirectal fat stranding. Vascular/Lymphatic: Abdominal aortic atherosclerosis. No aneurysm. Circumaortic left renal vein. No retroperitoneal adenopathy. Reproductive: Uterus is atrophic, normal for age.  No adnexal mass. Other: Small fat containing upper ventral abdominal wall hernia. Small amount of ascites adjacent to the liver in the spleen. There is no free air. No intra-abdominal abscess. There is whole body wall edema.  Musculoskeletal: Degenerative change throughout spine. There are no acute or suspicious osseous abnormalities. IMPRESSION: 1. Esophagus is diffusely edematous with diffuse wall thickening and intraluminal fluid. No pneumatosis or pneumomediastinum. No CT findings to suggest necrosis. Similar edematous appearance of the stomach. 2. Moderate bilateral pleural effusions with adjacent compressive atelectasis. Minimal smooth septal thickening may reflect pulmonary edema. 3. Contour deformity of the lower left kidney likely related to lobular contours and motion, difficult to exclude exophytic lesion. Nonemergent renal ultrasound could be considered when patient is able. 4. Rather extensive colonic diverticulosis, no diverticulitis. Rectus sigmoid colonic wall thickening with adjacent edema, suggesting chronic constipation. 5. Question of nodular hepatic contours raising concern for cirrhosis. 6. Whole body wall edema.  Minimal abdominal ascites. 7. Thoracoabdominal aortic atherosclerosis. Coronary artery calcifications. Electronically Signed   By: Jeb Levering M.D.   On: 04/17/2016 22:00   Dg Chest Port 1 View  Result Date: 04/16/2016 CLINICAL DATA:  Increased cough and congestion this morning. EXAM: PORTABLE CHEST 1 VIEW COMPARISON:  Chest x-rays dated 04/15/2016 and 03/29/2016. FINDINGS: Today's exam is slightly hypoinspiratory with crowding of the perihilar bronchovascular markings. New patchy airspace opacities are seen at the right lung base. Left lung remains clear. Cardiomediastinal silhouette is stable. Atherosclerotic changes noted at the aortic arch. Osseous structures about the chest are unremarkable. IMPRESSION: Low lung volumes. New patchy airspace opacities at the right lung base, atelectasis versus early developing pneumonia. Aortic atherosclerosis. Electronically Signed   By: Franki Cabot M.D.   On: 04/16/2016 08:16  Dg Chest Port 1 View  Result Date: 04/15/2016 CLINICAL DATA:  Unwitnessed  fall today.  Pleural effusion. EXAM: PORTABLE CHEST 1 VIEW COMPARISON:  03/29/2016 FINDINGS: Heart is borderline in size. Mild vascular congestion. Bibasilar atelectasis. No visible effusions or pneumothorax. No acute bony abnormality. IMPRESSION: Borderline heart size with vascular congestion. Bibasilar atelectasis. Electronically Signed   By: Rolm Baptise M.D.   On: 04/15/2016 15:19  Dg Chest Portable 1 View  Result Date: 03/26/2016 CLINICAL DATA:  Weakness, hypotension EXAM: PORTABLE CHEST 1 VIEW COMPARISON:  06/03/2008 FINDINGS: Cardiomediastinal silhouette is stable. No acute infiltrate or pleural effusion. No pulmonary edema. Mild degenerative changes thoracic spine. Degenerative changes bilateral shoulders. IMPRESSION: No active disease. Electronically Signed   By: Lahoma Crocker M.D.   On: 03/26/2016 15:22   Dg Abd Portable 1v  Result Date: 04/16/2016 CLINICAL DATA:  Melena. Ongoing heartburn. Pelvic pain since this morning. EXAM: PORTABLE ABDOMEN - 1 VIEW COMPARISON:  None FINDINGS: Moderate stool burden throughout the colon. Nonobstructive bowel gas pattern. No free air, organomegaly or suspicious calcification. Degenerative changes in the lumbar spine and hips. No acute bony abnormality. IMPRESSION: Moderate stool burden.  No acute findings. Electronically Signed   By: Rolm Baptise M.D.   On: 04/16/2016 09:30   Time Spent in minutes  30  Faye Ramsay M.D on 04/20/2016 at 4:08 PM  Between 7am to 7pm - Pager -  [phone]  After 7pm go to [email or web address] - password [facility]  [facility] -  Office  [phone]

## 2016-04-20 NOTE — Progress Notes (Deleted)
Pt oxygen sat was 93% on RA prior to ambulation. Ambulated more than 486ft and oxygen sat ranged from 88-97% during that time. No complaints of SOB. Pt did state that she felt a little tired.

## 2016-04-20 NOTE — Progress Notes (Signed)
Physical Therapy Treatment Patient Details Name: [name] [name] MRN: [id number] DOB: 1935/01/13 Today's Date: 04/20/2016    History of Present Illness 80 yo female admitted with melena, hyperglycemia, fall, AMS. hx of essential HTN, DM, multiple falls.    PT Comments    Noted bil UE edema. Pt somewhat drowsy but agreeable to OOB activity. Increased time and effort to complete tasks. Fatigues easily. She requested return to bed at end of session. Continue to recommend SNF for rehab.   Follow Up Recommendations  SNF     Equipment Recommendations  None recommended by PT    Recommendations for Other Services       Precautions / Restrictions Precautions Precautions: Fall Restrictions Weight Bearing Restrictions: No    Mobility  Bed Mobility Overal bed mobility: Needs Assistance Bed Mobility: Sidelying to Sit;Sit to Sidelying;Rolling Rolling: Supervision Sidelying to sit: Mod assist;HOB elevated     Sit to sidelying: Mod assist General bed mobility comments: Assist for trunk and bil LEs. Increased time. HOB elevated ~50 degrees to aid with getting trunk upright.   Transfers Overall transfer level: Needs assistance Equipment used: Rolling walker (2 wheeled) Transfers: Sit to/from [facility] Sit to Stand: Min assist;From elevated surface Stand pivot transfers: Min assist       General transfer comment: Assist to rise, stabilize, control descent. Multimodal cues for safety, technique, hand placement. Stand pivot, bed<>bsc, with RW.   Ambulation/Gait Ambulation/Gait assistance: Min assist Ambulation Distance (Feet): 4 Feet Assistive device: Rolling walker (2 wheeled) Gait Pattern/deviations: Decreased step length - left;Decreased step length - right;Decreased stride length;Trunk flexed     General Gait Details: Bil LE weakness noted. Assist to stabilize pt and maneuver with RW. Pt fatigues easily.    Stairs            Wheelchair Mobility     Modified Rankin (Stroke Patients Only)       Balance Overall balance assessment: Needs assistance         Standing balance support: Bilateral upper extremity supported Standing balance-Leahy Scale: Poor                      Cognition Arousal/Alertness: Awake/alert Behavior During Therapy: WFL for tasks assessed/performed Overall Cognitive Status: No family/caregiver present to determine baseline cognitive functioning                      Exercises  LAQ: 10 reps, bil, AROM    General Comments        Pertinent Vitals/Pain Pain Assessment: No/denies pain    Home Living                      Prior Function            PT Goals (current goals can now be found in the care plan section) Progress towards PT goals: Progressing toward goals (slowly)    Frequency  Min 3X/week    PT Plan Current plan remains appropriate    Co-evaluation             End of Session   Activity Tolerance: Patient limited by fatigue Patient left: in bed;with call bell/phone within reach;with bed alarm set     Time: [id number] PT Time Calculation (min) (ACUTE ONLY): 19 min  Charges:  $Therapeutic Activity: 8-22 mins                    G Codes:  Weston Anna, MPT Pager: [phone]

## 2016-04-20 NOTE — Care Management Note (Signed)
Case Management Note  Patient Details  Name: [name] [name] MRN: [id number] Date of Birth: 1935-07-25  Subjective/Objective:  81 y/o f admitted w/GOO. GI, TPN.From home PT-recc SNF. CSW following.                  Action/Plan:d/c plan SNF   Expected Discharge Date:                  Expected Discharge Plan:  Skilled Nursing Facility  In-House Referral:  Clinical Social Work  Discharge planning Services  CM Consult  Post Acute Care Choice:    Choice offered to:     DME Arranged:    DME Agency:     HH Arranged:    [place] Agency:     Status of Service:  In process, will continue to follow  If discussed at Long Length of Stay Meetings, dates discussed:    Additional Comments:  Dessa Phi, RN 04/20/2016, 11:46 AM

## 2016-04-20 NOTE — Progress Notes (Signed)
PARENTERAL NUTRITION CONSULT NOTE - Follow Up  Pharmacy Consult for TPN Indication: gastric outlet obstruction  Allergies  Allergen Reactions  . Ace Inhibitors Cough  . Prednisone     REACTION: lethargic  . Sitagliptin     Other reaction(s): Unknown    Patient Measurements: Height: 5\' 2"  (157.5 cm) Weight: 173 lb (78.5 kg) IBW/kg (Calculated) : 50.1 Adjusted Body Weight: 72.2 kg  Vital Signs: Temp: 98 F (36.7 C) (08/04 0508) Temp Source: Axillary (08/04 0508) BP: 120/52 (08/04 0508) Pulse Rate: 80 (08/04 0508) Intake/Output from previous day: 08/03 0701 - 08/04 0700 In: 1606.4 [I.V.:1056.4; IV Piggyback:550] Out: 700 [Urine:700] Intake/Output from this shift: No intake/output data recorded.  Labs:  Recent Labs  04/18/16 1800 04/19/16 0900 04/20/16 0609  WBC 8.3 6.4 6.4  HGB 8.5* 7.9* 8.3*  HCT 25.3* 23.3* 25.2*  PLT 247 221 220     Recent Labs  04/18/16 1800 04/19/16 0900 04/20/16 0609  NA 134* 132* 136  K 4.2 3.9 3.5  CL 109 107 108  CO2 21* 22 23  GLUCOSE 231* 295* 123*  BUN 13 12 12   CREATININE 0.86 0.81 0.70  CALCIUM 8.1* 8.0* 7.9*  MG 1.3* 1.4* 1.9  PHOS 1.3* 1.8*  --   PROT  --  4.6*  --   ALBUMIN  --  2.0*  --   AST  --  18  --   ALT  --  15  --   ALKPHOS  --  38  --   BILITOT  --  0.4  --   PREALBUMIN  --  7.2*  --   TRIG  --  126  --    Estimated Creatinine Clearance: 56.3 mL/min (by C-G formula based on SCr of 0.8 mg/dL).    Recent Labs  04/20/16 0014 04/20/16 0432 04/20/16 0800  GLUCAP 123* 134* 122*    Medical History: Past Medical History:  Diagnosis Date  . DDD (degenerative disc disease), lumbar   . Diabetes mellitus   . Diverticulosis   . Endocervical polyp   . Endometrial polyp   . GERD (gastroesophageal reflux disease)   . Hiatal hernia   . Hx of adenomatous colonic polyps   . Hyperlipidemia   . Hypertension   . Hyponatremia     Medications:  Scheduled:  . ampicillin-sulbactam (UNASYN) IV  1.5 g  Intravenous Q6H  . antiseptic oral rinse  7 mL Mouth Rinse q12n4p  . bisacodyl  10 mg Rectal Daily  . chlorhexidine  15 mL Mouth Rinse BID  . erythromycin   Both Eyes QHS  . fluconazole (DIFLUCAN) IV  200 mg Intravenous Q24H  . insulin aspart  0-20 Units Subcutaneous Q4H  . insulin aspart  14 Units Subcutaneous Once  . pantoprazole  40 mg Intravenous Q12H  . sodium chloride flush  10-40 mL Intracatheter Q12H  . sodium chloride flush  3 mL Intravenous Q12H   Infusions:  . 0.45 % NaCl with KCl 20 mEq / L 50 mL/hr at 04/20/16 0923  . Marland KitchenTPN (CLINIMIX-E) Adult 40 mL/hr at 04/19/16 1718   And  . fat emulsion 192 mL (04/19/16 1718)   PRN: albuterol, hydrALAZINE, [DISCONTINUED] ondansetron **OR** ondansetron (ZOFRAN) IV, sodium chloride flush  Insulin Requirements: 12 units Novolog required since starting TPN last PM  Current Nutrition: NPO  IVF: 0.45%NS with 20KCl at 50 ml/hr  Central access: PICC placed 8/2 TPN start date: 8/2  ASSESSMENT  HPI: 81 y.o.female,With history of essential hypertension, diabetes mellitus type 2, dyslipidemia, peptic ulcer disease, poor balance with multiple falls.  Admitted for dark stools which were heme positive with generalized weakness. EGD 7/31 diffuse esophagitis and a black necrotic appearing esophagus, pyloric mass causing gastric outlet obstruction. Biopsies pending. May need surgery. GI suggests TPN.  Significant events:   04/20/2016:    Glucose: improved to <150 with addition of insulin to TPN; PTA glipizide & metformin on hold  Electrolytes:   WNL  Corrected Ca WNL   Renal: SCr WNL  LFTs: Tbili slightly elevated on 7/30 but improved to WNL today  TGs: 126 on 8/3  Prealbumin: 7.2 on 8/3  NUTRITIONAL GOALS                                                                                             RD recs 7/31: 1250-1400 kcal,  70-85g protein per day Clinimix E 5/15 at a goal rate of 27ml/hr + 20% fat emulsion at 66ml/hr to provide: 72g/day protein, 1406Kcal/day.  PLAN                                                                                                                          At 1800 today:  Increase Clinimix E 5/15 to 60 ml/hr (goal rate)  20% fat emulsion at 79ml/hr  TPN to contain standard multivitamins and trace elements.  Add regular insulin to TPN to receive 8 units per 24hr  Decrease IVF to 85ml/hr   Increase SSI to resistant scale q4h.   TPN lab panels on Mondays & Thursdays.  Check BMet, Magnesium, & Phos in am  Netta Cedars, PharmD, BCPS Pager: [phone] 04/20/2016,11:13 AM

## 2016-04-20 NOTE — Progress Notes (Signed)
LCSWA attempted to meet with patient. Patient sleeping. Patient is currently TPN, not sure if patient will DC with TPN to SNF.  Patient will then need to choose a facility that will assist with TPN.  Weekend CSW will continue to follow and assist with patient needs.

## 2016-04-20 NOTE — Progress Notes (Signed)
Patient ID: [name] [name], female   DOB: March 04, 1935, 81 y.o.   MRN: [id number]    Progress Note   Subjective   Denies abdominal or esophageal pain, would like something to drink  Esophageal, gastric and duodenal bx - no maligancy, no hpylori-esophagus with ulcerated mucosa, necrotic debri and fungi   Objective   Vital signs in last 24 hours: Temp:  [97.5 F (36.4 C)-98.2 F (36.8 C)] 98 F (36.7 C) (08/04 0508) Pulse Rate:  [78-83] 80 (08/04 0508) Resp:  [17-18] 18 (08/04 0508) BP: (120-139)/(52-63) 120/52 (08/04 0508) SpO2:  [96 %-100 %] 99 % (08/04 0508) Weight:  [173 lb (78.5 kg)-180 lb 3.2 oz (81.7 kg)] 173 lb (78.5 kg) (08/04 0500) Last BM Date: 04/19/16 General: elderly    white female in NAD Heart:  Regular rate and rhythm; no murmurs Lungs: Respirations even and unlabored, lungs CTA bilaterally Abdomen:  Soft, nontender and nondistended.obese Normal bowel sounds. Extremities:  edematous. Neurologic:  Alert and oriented,  grossly normal neurologically. Psych:  Cooperative. Normal mood and affect.  Intake/Output from previous day: 08/03 0701 - 08/04 0700 In: 1606.4 [I.V.:1056.4; IV Piggyback:550] Out: 700 [Urine:700] Intake/Output this shift: No intake/output data recorded.  Lab Results:  Recent Labs  04/18/16 1800 04/19/16 0900 04/20/16 0609  WBC 8.3 6.4 6.4  HGB 8.5* 7.9* 8.3*  HCT 25.3* 23.3* 25.2*  PLT 247 221 220   BMET  Recent Labs  04/18/16 1800 04/19/16 0900 04/20/16 0609  NA 134* 132* 136  K 4.2 3.9 3.5  CL 109 107 108  CO2 21* 22 23  GLUCOSE 231* 295* 123*  BUN 13 12 12   CREATININE 0.86 0.81 0.70  CALCIUM 8.1* 8.0* 7.9*   LFT  Recent Labs  04/19/16 0900  PROT 4.6*  ALBUMIN 2.0*  AST 18  ALT 15  ALKPHOS 38  BILITOT 0.4   PT/INR No results for input(s): LABPROT, INR in the last 72 hours.  Studies/Results: No results found.     Assessment / Plan:    #1 81 yo WF with acute severe esophagitis /black esophagus,and  partial gastric outlet obstruction secondary to suspected pyloric mass  She has been stable , gradually improving parameters, and mental status Biopsies of pylorus do no show any malignant cells, esophageal  biopsies with acute inflammation, necrotic debri and fungal forms.  #2 Right pneumonia- day # 5 Iv Unasyn #3 AMS- resolving #4 AODM #5 anemia -multifactorial - stable #6 nutrition- day #3 TPN  Plan;  Will discuss- believe repeat EGD will be indicated  in next few days, with repeat BX of pylorus if still has suspected mass present, and can reassess esophagus Will start IV Diflucan  Continue TPN Start clears if OK with Dr Havery Moros   Principal Problem:   Melena Active Problems:   DM2 (diabetes mellitus, type 2) ([place])   GERD without esophagitis   Fall at home     LOS: 5 days   Garnie Borchardt  04/20/2016, 9:07 AM

## 2016-04-20 NOTE — Care Management Important Message (Signed)
Important Message  Patient Details  Name: [name] [name] MRN: [id number] Date of Birth: Jul 25, 1935   Medicare Important Message Given:  Yes    Camillo Flaming 04/20/2016, 9:50 AMImportant Message  Patient Details  Name: [name] [name] MRN: [id number] Date of Birth: 03-16-1935   Medicare Important Message Given:  Yes    Camillo Flaming 04/20/2016, 9:50 AM

## 2016-04-21 DIAGNOSIS — K209 Esophagitis, unspecified without bleeding: Secondary | ICD-10-CM | POA: Diagnosis present

## 2016-04-21 DIAGNOSIS — T17908A Unspecified foreign body in respiratory tract, part unspecified causing other injury, initial encounter: Secondary | ICD-10-CM | POA: Diagnosis present

## 2016-04-21 DIAGNOSIS — T17998A Other foreign object in respiratory tract, part unspecified causing other injury, initial encounter: Secondary | ICD-10-CM

## 2016-04-21 LAB — BASIC METABOLIC PANEL
ANION GAP: 3 — AB (ref 5–15)
BUN: 15 mg/dL (ref 6–20)
CHLORIDE: 106 mmol/L (ref 101–111)
CO2: 24 mmol/L (ref 22–32)
Calcium: 7.4 mg/dL — ABNORMAL LOW (ref 8.9–10.3)
Creatinine, Ser: 0.69 mg/dL (ref 0.44–1.00)
GFR calc non Af Amer: >60 mL/min (ref >60)
GLUCOSE: 203 mg/dL — AB (ref 65–99)
Potassium: 3.8 mmol/L (ref 3.5–5.1)
Sodium: 133 mmol/L — ABNORMAL LOW (ref 135–145)

## 2016-04-21 LAB — GLUCOSE, CAPILLARY
GLUCOSE-CAPILLARY: 165 mg/dL — AB (ref 65–99)
GLUCOSE-CAPILLARY: 183 mg/dL — AB (ref 65–99)
GLUCOSE-CAPILLARY: 206 mg/dL — AB (ref 65–99)
GLUCOSE-CAPILLARY: 211 mg/dL — AB (ref 65–99)
GLUCOSE-CAPILLARY: 224 mg/dL — AB (ref 65–99)
Glucose-Capillary: 212 mg/dL — ABNORMAL HIGH (ref 65–99)

## 2016-04-21 LAB — PREPARE RBC (CROSSMATCH)

## 2016-04-21 LAB — CBC
HEMATOCRIT: 22 % — AB (ref 36.0–46.0)
Hemoglobin: 7.3 g/dL — ABNORMAL LOW (ref 12.0–15.0)
MCH: 29.2 pg (ref 26.0–34.0)
MCHC: 33.2 g/dL (ref 30.0–36.0)
MCV: 88 fL (ref 78.0–100.0)
PLATELETS: 198 10*3/uL (ref 150–400)
RBC: 2.5 MIL/uL — AB (ref 3.87–5.11)
RDW: 16.5 % — ABNORMAL HIGH (ref 11.5–15.5)
WBC: 6.3 10*3/uL (ref 4.0–10.5)

## 2016-04-21 LAB — PHOSPHORUS: Phosphorus: 2.6 mg/dL (ref 2.5–4.6)

## 2016-04-21 LAB — MAGNESIUM: Magnesium: 1.6 mg/dL — ABNORMAL LOW (ref 1.7–2.4)

## 2016-04-21 LAB — MRSA PCR SCREENING: MRSA by PCR: NEGATIVE

## 2016-04-21 MED ORDER — VITAMINS A & D EX OINT
TOPICAL_OINTMENT | CUTANEOUS | Status: AC
  Administered 2016-04-21: 09:00:00
  Filled 2016-04-21: qty 5

## 2016-04-21 MED ORDER — TRACE MINERALS CR-CU-MN-SE-ZN 10-1000-500-60 MCG/ML IV SOLN
INTRAVENOUS | Status: AC
  Administered 2016-04-21 – 2016-04-22 (×2): via INTRAVENOUS
  Filled 2016-04-21: qty 1440

## 2016-04-21 MED ORDER — FAT EMULSION 20 % IV EMUL
192.0000 mL | INTRAVENOUS | Status: AC
  Administered 2016-04-21: 192 mL via INTRAVENOUS
  Administered 2016-04-22: 8 mL via INTRAVENOUS
  Filled 2016-04-21: qty 200

## 2016-04-21 MED ORDER — MAGNESIUM SULFATE 2 GM/50ML IV SOLN
2.0000 g | Freq: Once | INTRAVENOUS | Status: AC
  Administered 2016-04-21: 2 g via INTRAVENOUS
  Filled 2016-04-21: qty 50

## 2016-04-21 MED ORDER — SODIUM CHLORIDE 0.9 % IV SOLN
Freq: Once | INTRAVENOUS | Status: AC
  Administered 2016-04-21: 15:00:00 via INTRAVENOUS

## 2016-04-21 NOTE — Progress Notes (Signed)
Physical Therapy Treatment Patient Details Name: [name] [name] MRN: [id number] DOB: [birth year] Today's Date: 04/21/2016    History of Present Illness 81 yo female admitted with melena, hyperglycemia, fall, AMS. hx of essential HTN, DM, multiple falls.    PT Comments    RN reported pt with low hgb and set to have transfusion this p.m. Deferred OOB activity on today. Pt agreeable to bed level exercises. Will continue to follow and progress activity as tolerated.   Follow Up Recommendations  SNF     Equipment Recommendations  None recommended by PT    Recommendations for Other Services       Precautions / Restrictions Precautions Precautions: Fall Restrictions Weight Bearing Restrictions: No    Mobility  Bed Mobility               General bed mobility comments: NT  Transfers                 General transfer comment: NT  Ambulation/Gait                 Stairs            Wheelchair Mobility    Modified Rankin (Stroke Patients Only)       Balance                                    Cognition Arousal/Alertness: Awake/alert (sleepy) Behavior During Therapy: WFL for tasks assessed/performed Overall Cognitive Status: Within Functional Limits for tasks assessed                      Exercises General Exercises - Lower Extremity Ankle Circles/Pumps: AROM;Both;10 reps;Supine Quad Sets: AROM;Both;10 reps;Supine Gluteal Sets: AROM;10 reps;Supine Heel Slides: AROM;AAROM;Both;10 reps;Supine Hip ABduction/ADduction: AROM;AAROM;Both;10 reps;Supine    General Comments        Pertinent Vitals/Pain Pain Assessment: No/denies pain    Home Living                      Prior Function            PT Goals (current goals can now be found in the care plan section) Progress towards PT goals: Progressing toward goals    Frequency  Min 3X/week    PT Plan Current plan remains appropriate     Co-evaluation             End of Session   Activity Tolerance: Patient tolerated treatment well Patient left: in bed;with call bell/phone within reach;with bed alarm set     Time: [id number] PT Time Calculation (min) (ACUTE ONLY): 10 min  Charges:  $Therapeutic Exercise: 8-22 mins                    G Codes:      Weston Anna, MPT Pager: [phone]

## 2016-04-21 NOTE — Progress Notes (Signed)
PARENTERAL NUTRITION CONSULT NOTE - Follow Up  Pharmacy Consult for TPN Indication: gastric outlet obstruction  Allergies  Allergen Reactions  . Ace Inhibitors Cough  . Prednisone     REACTION: lethargic  . Sitagliptin     Other reaction(s): Unknown    Patient Measurements: Height: 5\' 2"  (157.5 cm) Weight: 172 lb 1.6 oz (78.1 kg) IBW/kg (Calculated) : 50.1 Adjusted Body Weight: 72.2 kg  Vital Signs: Temp: 97.7 F (36.5 C) (08/05 0416) BP: 121/50 (08/05 0416) Pulse Rate: 77 (08/05 0416) Intake/Output from previous day: 08/04 0701 - 08/05 0700 In: 1835.7 [I.V.:1835.7] Out: 650 [Urine:650] Intake/Output from this shift: No intake/output data recorded.  Labs:  Recent Labs  04/19/16 0900 04/20/16 0609 04/21/16 0500  WBC 6.4 6.4 6.3  HGB 7.9* 8.3* 7.3*  HCT 23.3* 25.2* 22.0*  PLT 221 220 198     Recent Labs  04/18/16 1800 04/19/16 0900 04/20/16 0609 04/21/16 0500  NA 134* 132* 136 133*  K 4.2 3.9 3.5 3.8  CL 109 107 108 106  CO2 21* 22 23 24   GLUCOSE 231* 295* 123* 203*  BUN 13 12 12 15   CREATININE 0.86 0.81 0.70 0.69  CALCIUM 8.1* 8.0* 7.9* 7.4*  MG 1.3* 1.4* 1.9 1.6*  PHOS 1.3* 1.8*  --  2.6  PROT  --  4.6*  --   --   ALBUMIN  --  2.0*  --   --   AST  --  18  --   --   ALT  --  15  --   --   ALKPHOS  --  38  --   --   BILITOT  --  0.4  --   --   PREALBUMIN  --  7.2*  --   --   TRIG  --  126  --   --    Estimated Creatinine Clearance: 56.1 mL/min (by C-G formula based on SCr of 0.8 mg/dL).    Recent Labs  04/21/16 0008 04/21/16 0419 04/21/16 0735  GLUCAP 224* 211* 206*    Medical History: Past Medical History:  Diagnosis Date  . DDD (degenerative disc disease), lumbar   . Diabetes mellitus   . Diverticulosis   . Endocervical polyp   . Endometrial polyp   . GERD (gastroesophageal reflux disease)   . Hiatal hernia   . Hx of adenomatous colonic polyps   . Hyperlipidemia   . Hypertension   . Hyponatremia     Medications:   Scheduled:  . ampicillin-sulbactam (UNASYN) IV  1.5 g Intravenous Q6H  . antiseptic oral rinse  7 mL Mouth Rinse q12n4p  . bisacodyl  10 mg Rectal Daily  . chlorhexidine  15 mL Mouth Rinse BID  . erythromycin   Both Eyes QHS  . fluconazole (DIFLUCAN) IV  200 mg Intravenous Q24H  . insulin aspart  0-20 Units Subcutaneous Q4H  . insulin aspart  14 Units Subcutaneous Once  . pantoprazole  40 mg Intravenous Q12H  . sodium chloride flush  10-40 mL Intracatheter Q12H  . sodium chloride flush  3 mL Intravenous Q12H   Infusions:  . Marland KitchenTPN (CLINIMIX-E) Adult 60 mL/hr at 04/20/16 1701   And  . fat emulsion 192 mL (04/20/16 1702)  . 0.45 % NaCl with KCl 20 mEq / L 50 mL/hr at 04/21/16 0450   PRN: albuterol, hydrALAZINE, [DISCONTINUED] ondansetron **OR** ondansetron (ZOFRAN) IV, sodium chloride flush  Insulin Requirements: 25 units Novolog required since TPN rate increased last PM  Current Nutrition:  NPO  IVF: 0.45%NS with 20KCl at 50 ml/hr  Central access: PICC placed 8/2 TPN start date: 8/2  ASSESSMENT                                                                                                          HPI: 81 y.o.female,With history of essential hypertension, diabetes mellitus type 2, dyslipidemia, peptic ulcer disease, poor balance with multiple falls.  Admitted for dark stools which were heme positive with generalized weakness. EGD 7/31 diffuse esophagitis and a black necrotic appearing esophagus, pyloric mass causing gastric outlet obstruction. Biopsies pending. May need surgery. GI suggests TPN.  Significant events:   04/21/2016:    Glucose: elevated >goal rate of 150 since rate increased yesterday; PTA glipizide & metformin on hold  Electrolytes:   Sodium slightly <goal  Magnesium slightly <goal  K+/Phos wnl  Corrected Ca WNL   Renal: SCr WNL  LFTs: Tbili slightly elevated on 7/30 but improved to WNL today  TGs: 126 on 8/3  Prealbumin: 7.2 on 8/3  NUTRITIONAL  GOALS                                                                                             RD recs 7/31: 1250-1400 kcal, 70-85g protein per day Clinimix E 5/15 at a goal rate of 50ml/hr + 20% fat emulsion at 23ml/hr to provide: 72g/day protein, 1406Kcal/day.  PLAN                                                                                                                          Now:  Magnesium 2gm IV x1 today At 1800 today:  Continue Clinimix E 5/15 at 60 ml/hr (goal rate)  20% fat emulsion at 36ml/hr  TPN to contain standard multivitamins and trace elements.  Add regular insulin to TPN to receive 15 units per 24hr  Decrease IVF to 50ml/hr   Continue SSI resistant scale q4h.   TPN lab panels on Mondays & Thursdays.  Check BMet, Magnesium in am  Netta Cedars, PharmD, BCPS Pager: [phone] 04/21/2016,8:16 AM

## 2016-04-21 NOTE — Progress Notes (Signed)
PROGRESS NOTE                                                                                                                                                                                                             Patient Demographics:    [name] [name], is a 81 y.o. female, DOB - 07-17-1935, AP:[id number]  Admit date - 04/15/2016   Admitting Physician Thurnell Lose, MD  Outpatient Primary MD for the patient is [name], MD  LOS - 6  Brief Narrative    81 y.o. female, With history of essential hypertension, diabetes mellitus type 2, dyslipidemia, peptic ulcer disease, poor balance with multiple falls recently started using a walker still lives at home, has been experiencing some dark stools along with generalized weakness.  In the ER patient was found to have heme positive stool, she appeared slightly weak, dehydrated and pale.   Assessment  & Plan :   Acute upper GI bleed with melena and acute blood loss anemia. 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  - CT scan with no evidence of pneumotosis or pneumomediastinum related to the esophagus findings, although she is at risk for this - per GI team, acute esophageal necrosis carries a mortality rate between 15-30% - recommended treatment is supportive care with IVF, broad spectrum antibiotics, and IV PPI.  - Biopsies of pylorus do no show any malignant cells, esophageal  biopsies with acute inflammation, necrotic debri and fungal forms, ? Need for repeat EGD per GI team  - TPN for nutrition continued  - diflucan added per GI team and will continue same regimen - plan for EGD in AM  GERD - continue PPI   Generalized weakness with multiple falls.  - PT evaluation requested - plan to d/c to SNF once medically cleared     Essential hypertension - reasonable inpatient control   Dehydration with hyponatremia and hypokalemia - improved with IVF and supplementation  - Mg is still low, continue to supplement  - repeat Mg and BMP in AM  Leukocytosis - has been on Unasyn  - resolved   Mild hematemesis with right-sided aspiration pneumonitis on 04/16/2016.  - Currently not hypoxic - continue to cover with Unasyn and monitor, plan total duration  of of ABX for 7 days   DM type II.  - Continue on sliding scale will change it to every 6 hours since she is nothing by mouth. - A1C is 6.6  Family Communication  :  Pt at bedside   Code Status :  Full  Diet : NPO   Disposition Plan  :  Stay inpt  Consults  :  GI  Procedures  :  EGD due later today  DVT Prophylaxis  :    SCDs    Inpatient Medications  Scheduled Meds: . ampicillin-sulbactam (UNASYN) IV  1.5 g Intravenous Q6H  . antiseptic oral rinse  7 mL Mouth Rinse q12n4p  . bisacodyl  10 mg Rectal Daily  . chlorhexidine  15 mL Mouth Rinse BID  . erythromycin   Both Eyes QHS  . fluconazole (DIFLUCAN) IV  200 mg Intravenous Q24H  . insulin aspart  0-20 Units Subcutaneous Q4H  . insulin aspart  14 Units Subcutaneous Once  . pantoprazole  40 mg Intravenous Q12H  . sodium chloride flush  10-40 mL Intracatheter Q12H  . sodium chloride flush  3 mL Intravenous Q12H   Continuous Infusions: . Marland KitchenTPN (CLINIMIX-E) Adult 60 mL/hr at 04/21/16 1708   And  . fat emulsion 192 mL (04/21/16 1708)  . 0.45 % NaCl with KCl 20 mEq / L 30 mL/hr at 04/21/16 0900   PRN Meds:.albuterol, hydrALAZINE, [DISCONTINUED] ondansetron **OR** ondansetron (ZOFRAN) IV, sodium chloride flush  Antibiotics  :    Anti-infectives    Start     Dose/Rate Route Frequency Ordered Stop   04/20/16 1000  fluconazole (DIFLUCAN) IVPB 200 mg     200 mg 100 mL/hr over 60 Minutes Intravenous Every 24 hours 04/20/16 0929     04/16/16 0900  ampicillin-sulbactam (UNASYN) 1.5 g in sodium  chloride 0.9 % 50 mL IVPB     1.5 g 100 mL/hr over 30 Minutes Intravenous Every 6 hours 04/16/16 0806           Objective:   Vitals:   04/21/16 1407 04/21/16 1520 04/21/16 1542 04/21/16 1808  BP: (!) 127/52 91/77 (!) 132/52 (!) 151/63  Pulse: 77 79 71 78  Resp: 18 18 16 20   Temp: 97.6 F (36.4 C) 98.1 F (36.7 C) 98.3 F (36.8 C) 97.5 F (36.4 C)  TempSrc: Oral Oral Oral Oral  SpO2: 98% 97% 96% 98%  Weight:      Height:        Wt Readings from Last 3 Encounters:  04/21/16 78.1 kg (172 lb 1.6 oz)  01/20/16 78.5 kg (173 lb)  08/25/15 80.3 kg (177 lb)     Intake/Output Summary (Last 24 hours) at 04/21/16 1958 Last data filed at 04/21/16 1840  Gross per 24 hour  Intake             1820 ml  Output             1250 ml  Net              570 ml     Physical Exam  Awake , Oriented X 2, NAD Supple Neck,No JVD, No cervical lymphadenopathy appriciated.  Symmetrical Chest wall movement, Good air movement bilaterally, few R.sided rales RRR,No Gallops,Rubs or new Murmurs Abd Soft, No tenderness, No organomegaly appriciated, No rebound - guarding or rigidity. No Cyanosis, Clubbing or edema, No new Rash or bruise      Data Review:    CBC  Recent Labs Lab 04/15/16 1315  04/17/16 0529 04/18/16 1800 04/19/16 0900 04/20/16 0609 04/21/16 0500  WBC 21.5*  < > 12.2* 8.3 6.4 6.4 6.3  HGB 12.5  < > 8.4* 8.5* 7.9* 8.3* 7.3*  HCT 36.9  < > 25.0* 25.3* 23.3* 25.2* 22.0*  PLT 455*  < > 233 247 221 220 198  MCV 86.0  < > 89.3 88.2 88.3 86.9 88.0  MCH 29.1  < > 30.0 29.6 29.9 28.6 29.2  MCHC 33.9  < > 33.6 33.6 33.9 32.9 33.2  RDW 15.7*  < > 17.1* 17.0* 16.8* 16.4* 16.5*  LYMPHSABS 1.3  --   --   --  1.0  --   --   MONOABS 1.2*  --   --   --  0.5  --   --   EOSABS 0.0  --   --   --  0.1  --   --   BASOSABS 0.1  --   --   --  0.0  --   --   < > = values in this interval not displayed.  Chemistries   Recent Labs Lab 04/15/16 1315  04/17/16 0529 04/18/16 1800  04/19/16 0900 04/20/16 0609 04/21/16 0500  NA 129*  < > 135 134* 132* 136 133*  K 3.3*  < > 4.7 4.2 3.9 3.5 3.8  CL 89*  < > 111 109 107 108 106  CO2 26  < > 20* 21* 22 23 24   GLUCOSE 433*  < > 157* 231* 295* 123* 203*  BUN 35*  < > 22* 13 12 12 15   CREATININE 1.11*  < > 0.85 0.86 0.81 0.70 0.69  CALCIUM 11.9*  < > 8.8* 8.1* 8.0* 7.9* 7.4*  MG  --   --   --  1.3* 1.4* 1.9 1.6*  AST 21  --   --   --  18  --   --   ALT 19  --   --   --  15  --   --   ALKPHOS 60  --   --   --  38  --   --   BILITOT 1.3*  --   --   --  0.4  --   --   < > = values in this interval not displayed. ------------------------------------------------------------------------------------------------------------------  Recent Labs  04/19/16 0900  TRIG 126    Lab Results  Component Value Date   HGBA1C 6.6 (H) 04/15/2016   ------------------------------------------------------------------------------------------------------------------ No results for input(s): TSH, T4TOTAL, T3FREE, THYROIDAB in the last 72 hours.  Invalid input(s): FREET3 ------------------------------------------------------------------------------------------------------------------ No results for input(s): VITAMINB12, FOLATE, FERRITIN, TIBC, IRON, RETICCTPCT in the last 72 hours.  Coagulation profile  Recent Labs Lab 04/15/16 1315  INR 1.13     ------------------------------------------------------------------------------------------------------------------ No results found for: BNP  Micro Results Recent Results (from the past 240 hour(s))  MRSA PCR Screening     Status: None   Collection Time: 04/17/16  6:27 PM  Result Value Ref Range Status   MRSA by PCR NEGATIVE NEGATIVE Final    Radiology Reports Dg Chest 2 View  Result Date: 03/29/2016 CLINICAL DATA:  Hypotension and weakness for 1 week. History of diabetes and high blood pressure. EXAM: CHEST  2 VIEW COMPARISON:  Seventeen 17 FINDINGS: Normal heart size and  pulmonary vascularity. No focal airspace disease or consolidation in the lungs. No blunting of costophrenic angles. No pneumothorax. Mediastinal contours appear intact. Degenerative changes in the spine and shoulders. Calcification of the aorta. IMPRESSION: No active cardiopulmonary  disease. Electronically Signed   By: Lucienne Capers M.D.   On: 03/29/2016 06:15   Ct Head Wo Contrast  Result Date: 04/15/2016 CLINICAL DATA:  Unwitnessed fall. Fell wall trying again out of bed. Trauma to head without loss of consciousness. Initial encounter. EXAM: CT HEAD WITHOUT CONTRAST CT CERVICAL SPINE WITHOUT CONTRAST TECHNIQUE: Multidetector CT imaging of the head and cervical spine was performed following the standard protocol without intravenous contrast. Multiplanar CT image reconstructions of the cervical spine were also generated. COMPARISON:  CT head without contrast 03/29/2016. CT of the head and face 12/24/2015. FINDINGS: CT HEAD FINDINGS Moderate atrophy and periventricular white matter hypoattenuation is stable. This is slightly advanced for age. The ventricles proportionate to the degree of atrophy. A remote infarct of the right cerebellum is stable. No acute infarct, hemorrhage, or mass lesion is present. No significant extraaxial fluid collection is present. Minimal soft tissue swelling is present in the left occipital sub scalp without an underlying fracture. The calvarium is intact. A remote right inferior orbital floor fracture has healed. The paranasal sinuses and mastoid air cells are clear. CT CERVICAL SPINE FINDINGS The cervical spine is imaged from the skullbase through T2-3. Asymmetric degenerative changes are present at the left atlantooccipital joint. Degenerative changes are present at C1-2. There is chronic loss of disc height from C3 through C7. Vertebral body heights and alignment are maintained. Facet hypertrophy is worse on the right. Soft tissues the neck demonstrate thyroid atrophy.  Atherosclerotic calcifications are present at the carotid bifurcations bilaterally without significant stenoses. A large right pleural effusion is present. The lung apices are otherwise clear. IMPRESSION: 1. Stable moderate atrophy and white matter disease. No acute intracranial abnormality. 2. Minimal soft tissue swelling in the left occipital scalp may be related to the acute trauma. There is no underlying fracture. The 3. Remote right inferior orbital floor fracture has healed. 4. Multilevel degenerative changes in the cervical spine. 5. New moderate-sized right pleural effusion. Recommend two-view chest x-ray for further evaluation. Electronically Signed   By: San Morelle M.D.   On: 04/15/2016 14:40  Ct Head Wo Contrast  Result Date: 03/29/2016 CLINICAL DATA:  Hypoglycemic and hypotensive for about a week. Fell 6 days ago. Weakness since the fall. Confusion, double vision, and slurred speech for 1 day. Unsteady gait. EXAM: CT HEAD WITHOUT CONTRAST TECHNIQUE: Contiguous axial images were obtained from the base of the skull through the vertex without intravenous contrast. COMPARISON:  12/24/2015 FINDINGS: Mild cerebral atrophy. Ventricular dilatation consistent with central atrophy. Low-attenuation changes in the deep white matter consistent with small vessel ischemia. No mass effect or midline shift. No abnormal extra-axial fluid collections. Gray-white matter junctions are distinct. Basal cisterns are not effaced. No evidence of acute intracranial hemorrhage. No depressed skull fractures. Visualized paranasal sinuses and mastoid air cells are not opacified. IMPRESSION: No acute intracranial abnormalities. Chronic atrophy and small vessel ischemic changes. Electronically Signed   By: Lucienne Capers M.D.   On: 03/29/2016 04:26   Ct Chest W Contrast  Result Date: 04/17/2016 CLINICAL DATA:  Black esophagus on EGD today.  Rule out necrosis. EXAM: CT CHEST, ABDOMEN, AND PELVIS WITH CONTRAST  TECHNIQUE: Multidetector CT imaging of the chest, abdomen and pelvis was performed following the standard protocol during bolus administration of intravenous contrast. CONTRAST:  140mL ISOVUE-300 IOPAMIDOL (ISOVUE-300) INJECTION 61% COMPARISON:  Chest radiographs yesterday. No prior chest CT. CT abdomen/pelvis 08/04/2012 FINDINGS: CT CHEST FINDINGS Cardiovascular: Tortuosity of the thoracic aorta without aneurysm. There is moderate atherosclerosis.  Conventional branching pattern from the aortic arch. No aortic dissection. Scattered coronary artery calcifications. Heart is normal in size. Mediastinum/Nodes: There is diffuse esophageal wall thickening from the thoracic inlet through the diaphragmatic hiatus. Intraluminal esophageal fluid. No evidence of pneumatosis. No pneumomediastinum. Small amount of paraesophageal fluid distally may be contiguous with right pleural effusion. Prominent right upper paratracheal node measures 10 mm short axis. Small lower paratracheal nodes not enlarged by size criteria. No evidence of hilar adenopathy. No axillary adenopathy. No pericardial effusion. Lungs/Pleura: Moderate bilateral pleural effusions, right greater than left. There is adjacent compressive atelectasis in both lower lobes. Mild patchy opacities in the anterior right upper lobe, left upper lobe to a lesser extent and superior segment left lower lobe. There is minimal smooth septal thickening. Trachea and mainstem bronchi are patent. Musculoskeletal: There are no acute or suspicious osseous abnormalities. CT ABDOMEN PELVIS FINDINGS Hepatobiliary: No focal hepatic lesion. Streak artifact from arms down positioning. Question of nodular hepatic contours. Postcholecystectomy. No biliary dilatation. Pancreas: Atrophic parenchyma. No ductal dilatation or inflammation. Spleen: Unremarkable. Adrenals/Urinary Tract: No adrenal nodule. Symmetric renal enhancement. No hydronephrosis. Motion artifact through the lower left kidney.  Contour deformity is likely secondary to lobular contours and motion, less likely focal renal lesion. Urinary bladder is physiologically distended. Stomach/Bowel: Diffuse wall thickening and edematous appearance. Small volume of intraluminal gastric fluid. No pneumatosis. Small bowel is decompressed. No definite small bowel thickening. Extensive colonic diverticulosis of the descending and sigmoid colon without acute diverticulitis. Scattered colonic diverticular throughout the remainder the colon. Appendix tentatively identified and normal. There is mild wall thickening at the rectosigmoid junction with adjacent perirectal fat stranding. Vascular/Lymphatic: Abdominal aortic atherosclerosis. No aneurysm. Circumaortic left renal vein. No retroperitoneal adenopathy. Reproductive: Uterus is atrophic, normal for age.  No adnexal mass. Other: Small fat containing upper ventral abdominal wall hernia. Small amount of ascites adjacent to the liver in the spleen. There is no free air. No intra-abdominal abscess. There is whole body wall edema. Musculoskeletal: Degenerative change throughout spine. There are no acute or suspicious osseous abnormalities. IMPRESSION: 1. Esophagus is diffusely edematous with diffuse wall thickening and intraluminal fluid. No pneumatosis or pneumomediastinum. No CT findings to suggest necrosis. Similar edematous appearance of the stomach. 2. Moderate bilateral pleural effusions with adjacent compressive atelectasis. Minimal smooth septal thickening may reflect pulmonary edema. 3. Contour deformity of the lower left kidney likely related to lobular contours and motion, difficult to exclude exophytic lesion. Nonemergent renal ultrasound could be considered when patient is able. 4. Rather extensive colonic diverticulosis, no diverticulitis. Rectus sigmoid colonic wall thickening with adjacent edema, suggesting chronic constipation. 5. Question of nodular hepatic contours raising concern for  cirrhosis. 6. Whole body wall edema.  Minimal abdominal ascites. 7. Thoracoabdominal aortic atherosclerosis. Coronary artery calcifications. Electronically Signed   By: Jeb Levering M.D.   On: 04/17/2016 22:00   Ct Cervical Spine Wo Contrast  Result Date: 04/15/2016 CLINICAL DATA:  Unwitnessed fall. Fell wall trying again out of bed. Trauma to head without loss of consciousness. Initial encounter. EXAM: CT HEAD WITHOUT CONTRAST CT CERVICAL SPINE WITHOUT CONTRAST TECHNIQUE: Multidetector CT imaging of the head and cervical spine was performed following the standard protocol without intravenous contrast. Multiplanar CT image reconstructions of the cervical spine were also generated. COMPARISON:  CT head without contrast 03/29/2016. CT of the head and face 12/24/2015. FINDINGS: CT HEAD FINDINGS Moderate atrophy and periventricular white matter hypoattenuation is stable. This is slightly advanced for age. The ventricles proportionate to the degree of  atrophy. A remote infarct of the right cerebellum is stable. No acute infarct, hemorrhage, or mass lesion is present. No significant extraaxial fluid collection is present. Minimal soft tissue swelling is present in the left occipital sub scalp without an underlying fracture. The calvarium is intact. A remote right inferior orbital floor fracture has healed. The paranasal sinuses and mastoid air cells are clear. CT CERVICAL SPINE FINDINGS The cervical spine is imaged from the skullbase through T2-3. Asymmetric degenerative changes are present at the left atlantooccipital joint. Degenerative changes are present at C1-2. There is chronic loss of disc height from C3 through C7. Vertebral body heights and alignment are maintained. Facet hypertrophy is worse on the right. Soft tissues the neck demonstrate thyroid atrophy. Atherosclerotic calcifications are present at the carotid bifurcations bilaterally without significant stenoses. A large right pleural effusion is  present. The lung apices are otherwise clear. IMPRESSION: 1. Stable moderate atrophy and white matter disease. No acute intracranial abnormality. 2. Minimal soft tissue swelling in the left occipital scalp may be related to the acute trauma. There is no underlying fracture. The 3. Remote right inferior orbital floor fracture has healed. 4. Multilevel degenerative changes in the cervical spine. 5. New moderate-sized right pleural effusion. Recommend two-view chest x-ray for further evaluation. Electronically Signed   By: San Morelle M.D.   On: 04/15/2016 14:40  Ct Abdomen Pelvis W Contrast  Result Date: 04/17/2016 CLINICAL DATA:  Black esophagus on EGD today.  Rule out necrosis. EXAM: CT CHEST, ABDOMEN, AND PELVIS WITH CONTRAST TECHNIQUE: Multidetector CT imaging of the chest, abdomen and pelvis was performed following the standard protocol during bolus administration of intravenous contrast. CONTRAST:  167mL ISOVUE-300 IOPAMIDOL (ISOVUE-300) INJECTION 61% COMPARISON:  Chest radiographs yesterday. No prior chest CT. CT abdomen/pelvis 08/04/2012 FINDINGS: CT CHEST FINDINGS Cardiovascular: Tortuosity of the thoracic aorta without aneurysm. There is moderate atherosclerosis. Conventional branching pattern from the aortic arch. No aortic dissection. Scattered coronary artery calcifications. Heart is normal in size. Mediastinum/Nodes: There is diffuse esophageal wall thickening from the thoracic inlet through the diaphragmatic hiatus. Intraluminal esophageal fluid. No evidence of pneumatosis. No pneumomediastinum. Small amount of paraesophageal fluid distally may be contiguous with right pleural effusion. Prominent right upper paratracheal node measures 10 mm short axis. Small lower paratracheal nodes not enlarged by size criteria. No evidence of hilar adenopathy. No axillary adenopathy. No pericardial effusion. Lungs/Pleura: Moderate bilateral pleural effusions, right greater than left. There is adjacent  compressive atelectasis in both lower lobes. Mild patchy opacities in the anterior right upper lobe, left upper lobe to a lesser extent and superior segment left lower lobe. There is minimal smooth septal thickening. Trachea and mainstem bronchi are patent. Musculoskeletal: There are no acute or suspicious osseous abnormalities. CT ABDOMEN PELVIS FINDINGS Hepatobiliary: No focal hepatic lesion. Streak artifact from arms down positioning. Question of nodular hepatic contours. Postcholecystectomy. No biliary dilatation. Pancreas: Atrophic parenchyma. No ductal dilatation or inflammation. Spleen: Unremarkable. Adrenals/Urinary Tract: No adrenal nodule. Symmetric renal enhancement. No hydronephrosis. Motion artifact through the lower left kidney. Contour deformity is likely secondary to lobular contours and motion, less likely focal renal lesion. Urinary bladder is physiologically distended. Stomach/Bowel: Diffuse wall thickening and edematous appearance. Small volume of intraluminal gastric fluid. No pneumatosis. Small bowel is decompressed. No definite small bowel thickening. Extensive colonic diverticulosis of the descending and sigmoid colon without acute diverticulitis. Scattered colonic diverticular throughout the remainder the colon. Appendix tentatively identified and normal. There is mild wall thickening at the rectosigmoid junction with adjacent perirectal  fat stranding. Vascular/Lymphatic: Abdominal aortic atherosclerosis. No aneurysm. Circumaortic left renal vein. No retroperitoneal adenopathy. Reproductive: Uterus is atrophic, normal for age.  No adnexal mass. Other: Small fat containing upper ventral abdominal wall hernia. Small amount of ascites adjacent to the liver in the spleen. There is no free air. No intra-abdominal abscess. There is whole body wall edema. Musculoskeletal: Degenerative change throughout spine. There are no acute or suspicious osseous abnormalities. IMPRESSION: 1. Esophagus is  diffusely edematous with diffuse wall thickening and intraluminal fluid. No pneumatosis or pneumomediastinum. No CT findings to suggest necrosis. Similar edematous appearance of the stomach. 2. Moderate bilateral pleural effusions with adjacent compressive atelectasis. Minimal smooth septal thickening may reflect pulmonary edema. 3. Contour deformity of the lower left kidney likely related to lobular contours and motion, difficult to exclude exophytic lesion. Nonemergent renal ultrasound could be considered when patient is able. 4. Rather extensive colonic diverticulosis, no diverticulitis. Rectus sigmoid colonic wall thickening with adjacent edema, suggesting chronic constipation. 5. Question of nodular hepatic contours raising concern for cirrhosis. 6. Whole body wall edema.  Minimal abdominal ascites. 7. Thoracoabdominal aortic atherosclerosis. Coronary artery calcifications. Electronically Signed   By: Jeb Levering M.D.   On: 04/17/2016 22:00   Dg Chest Port 1 View  Result Date: 04/16/2016 CLINICAL DATA:  Increased cough and congestion this morning. EXAM: PORTABLE CHEST 1 VIEW COMPARISON:  Chest x-rays dated 04/15/2016 and 03/29/2016. FINDINGS: Today's exam is slightly hypoinspiratory with crowding of the perihilar bronchovascular markings. New patchy airspace opacities are seen at the right lung base. Left lung remains clear. Cardiomediastinal silhouette is stable. Atherosclerotic changes noted at the aortic arch. Osseous structures about the chest are unremarkable. IMPRESSION: Low lung volumes. New patchy airspace opacities at the right lung base, atelectasis versus early developing pneumonia. Aortic atherosclerosis. Electronically Signed   By: Franki Cabot M.D.   On: 04/16/2016 08:16  Dg Chest Port 1 View  Result Date: 04/15/2016 CLINICAL DATA:  Unwitnessed fall today.  Pleural effusion. EXAM: PORTABLE CHEST 1 VIEW COMPARISON:  03/29/2016 FINDINGS: Heart is borderline in size. Mild vascular  congestion. Bibasilar atelectasis. No visible effusions or pneumothorax. No acute bony abnormality. IMPRESSION: Borderline heart size with vascular congestion. Bibasilar atelectasis. Electronically Signed   By: Rolm Baptise M.D.   On: 04/15/2016 15:19  Dg Chest Portable 1 View  Result Date: 03/26/2016 CLINICAL DATA:  Weakness, hypotension EXAM: PORTABLE CHEST 1 VIEW COMPARISON:  06/03/2008 FINDINGS: Cardiomediastinal silhouette is stable. No acute infiltrate or pleural effusion. No pulmonary edema. Mild degenerative changes thoracic spine. Degenerative changes bilateral shoulders. IMPRESSION: No active disease. Electronically Signed   By: Lahoma Crocker M.D.   On: 03/26/2016 15:22   Dg Abd Portable 1v  Result Date: 04/16/2016 CLINICAL DATA:  Melena. Ongoing heartburn. Pelvic pain since this morning. EXAM: PORTABLE ABDOMEN - 1 VIEW COMPARISON:  None FINDINGS: Moderate stool burden throughout the colon. Nonobstructive bowel gas pattern. No free air, organomegaly or suspicious calcification. Degenerative changes in the lumbar spine and hips. No acute bony abnormality. IMPRESSION: Moderate stool burden.  No acute findings. Electronically Signed   By: Rolm Baptise M.D.   On: 04/16/2016 09:30   Time Spent in minutes  30  MAGICK-Shakyra Mattera M.D on 04/21/2016 at 7:58 PM  Between 7am to 7pm - Pager - [phone]  After 7pm go to [email or web address] - password [facility]  [facility] -  Office  [phone]

## 2016-04-21 NOTE — Progress Notes (Signed)
Patient ID: [name] [name], female   DOB: November 30, 1934, 81 y.o.   MRN: [id number]    Progress Note   Subjective  Feels ok, says abdomen bothers her off and on, no  nausea or vomiting- denies CP,heartburn   Objective   Vital signs in last 24 hours: Temp:  [97.5 F (36.4 C)-97.9 F (36.6 C)] 97.7 F (36.5 C) (08/05 0416) Pulse Rate:  [77-84] 77 (08/05 0416) Resp:  [18-20] 20 (08/05 0416) BP: (107-147)/(50-56) 121/50 (08/05 0416) SpO2:  [94 %-100 %] 97 % (08/05 0416) Weight:  [172 lb 1.6 oz (78.1 kg)] 172 lb 1.6 oz (78.1 kg) (08/05 0416) Last BM Date: 04/19/16 General:    Elderly white female in NAD  pale  Heart:  Regular rate and rhythm; no murmurs Lungs: Respirations even and unlabored, lungs CTA bilaterally Abdomen:  Soft,large , nontender and nondistended. Normal bowel sounds. Extremities: edematous Neurologic:  Alert and oriented,  grossly normal neurologically. Psych:  Cooperative. Normal mood and affect.  Intake/Output from previous day: 08/04 0701 - 08/05 0700 In: 1835.7 [I.V.:1835.7] Out: 650 [Urine:650] Intake/Output this shift: No intake/output data recorded.  Lab Results:  Recent Labs  04/19/16 0900 04/20/16 0609 04/21/16 0500  WBC 6.4 6.4 6.3  HGB 7.9* 8.3* 7.3*  HCT 23.3* 25.2* 22.0*  PLT 221 220 198   BMET  Recent Labs  04/19/16 0900 04/20/16 0609 04/21/16 0500  NA 132* 136 133*  K 3.9 3.5 3.8  CL 107 108 106  CO2 22 23 24   GLUCOSE 295* 123* 203*  BUN 12 12 15   CREATININE 0.81 0.70 0.69  CALCIUM 8.0* 7.9* 7.4*   LFT  Recent Labs  04/19/16 0900  PROT 4.6*  ALBUMIN 2.0*  AST 18  ALT 15  ALKPHOS 38  BILITOT 0.4   PT/INR No results for input(s): LABPROT, INR in the last 72 hours.  Studies/Results: No results found.     Assessment / Plan:    #1  81 yo female with partial GOO secondary secondary to pyloric lesion, and severe esophagitis with necrosis /Black esophagus. Bx of pylorus did not prove malignancy Esophageal bx with  ulceration,nectrotic debri and fungal forms  #2 anemia- hgb drifted to 7.3 this am- will give one unit #3 Right pneumonia secondary to aspiration - on Unasyn #4 AODM #5 AMS- resolved   Plan; Continue NPO today  On TPN  Continue Unasyn, Diflucan ,PPI, BID Scheduling for EGD with Dr Havery Moros in am to reassess and bx   Principal Problem:   Melena Active Problems:   DM2 (diabetes mellitus, type 2) ([place])   GERD without esophagitis   Fall at home   Gastric outlet obstruction     LOS: 6 days   Amy Esterwood  04/21/2016, 9:20 AM

## 2016-04-22 ENCOUNTER — Encounter (HOSPITAL_COMMUNITY)
Admission: EM | Disposition: A | Discharge status: Skilled Nursing Facility | Payer: Self-pay | Source: Home / Self Care | Attending: Internal Medicine

## 2016-04-22 ENCOUNTER — Inpatient Hospital Stay (HOSPITAL_COMMUNITY): Payer: Medicare Other | Admitting: Registered Nurse

## 2016-04-22 ENCOUNTER — Encounter (HOSPITAL_COMMUNITY): Payer: Self-pay | Admitting: Registered Nurse

## 2016-04-22 LAB — GLUCOSE, CAPILLARY
GLUCOSE-CAPILLARY: 178 mg/dL — AB (ref 65–99)
GLUCOSE-CAPILLARY: 187 mg/dL — AB (ref 65–99)
GLUCOSE-CAPILLARY: 207 mg/dL — AB (ref 65–99)
Glucose-Capillary: 214 mg/dL — ABNORMAL HIGH (ref 65–99)
Glucose-Capillary: 182 mg/dL — ABNORMAL HIGH (ref 65–99)

## 2016-04-22 LAB — HEMOGLOBIN AND HEMATOCRIT, BLOOD
HEMATOCRIT: 28.2 % — AB (ref 36.0–46.0)
Hemoglobin: 9.1 g/dL — ABNORMAL LOW (ref 12.0–15.0)

## 2016-04-22 LAB — BASIC METABOLIC PANEL
Anion gap: 4 — ABNORMAL LOW (ref 5–15)
BUN: 16 mg/dL (ref 6–20)
CALCIUM: 7.5 mg/dL — AB (ref 8.9–10.3)
CO2: 25 mmol/L (ref 22–32)
CREATININE: 0.71 mg/dL (ref 0.44–1.00)
Chloride: 104 mmol/L (ref 101–111)
GFR calc Af Amer: >60 mL/min (ref >60)
GLUCOSE: 199 mg/dL — AB (ref 65–99)
Potassium: 4.2 mmol/L (ref 3.5–5.1)
SODIUM: 133 mmol/L — AB (ref 135–145)

## 2016-04-22 LAB — MAGNESIUM: MAGNESIUM: 1.8 mg/dL (ref 1.7–2.4)

## 2016-04-22 SURGERY — ESOPHAGOGASTRODUODENOSCOPY (EGD) WITH PROPOFOL
Anesthesia: Monitor Anesthesia Care

## 2016-04-22 SURGERY — EGD (ESOPHAGOGASTRODUODENOSCOPY)
Anesthesia: Monitor Anesthesia Care

## 2016-04-22 MED ORDER — PROPOFOL 10 MG/ML IV BOLUS
INTRAVENOUS | Status: AC
  Filled 2016-04-22: qty 40

## 2016-04-22 MED ORDER — LACTATED RINGERS IV SOLN
INTRAVENOUS | Status: DC | PRN
  Administered 2016-04-22: 08:00:00 via INTRAVENOUS

## 2016-04-22 MED ORDER — TRACE MINERALS CR-CU-MN-SE-ZN 10-1000-500-60 MCG/ML IV SOLN
INTRAVENOUS | Status: AC
  Administered 2016-04-22: 17:00:00 via INTRAVENOUS
  Filled 2016-04-22: qty 1440

## 2016-04-22 MED ORDER — ONDANSETRON HCL 4 MG/2ML IJ SOLN
INTRAMUSCULAR | Status: AC
  Filled 2016-04-22: qty 2

## 2016-04-22 MED ORDER — GLYCOPYRROLATE 0.2 MG/ML IJ SOLN
INTRAMUSCULAR | Status: AC
  Filled 2016-04-22: qty 1

## 2016-04-22 MED ORDER — FENTANYL CITRATE (PF) 100 MCG/2ML IJ SOLN
INTRAMUSCULAR | Status: AC
  Filled 2016-04-22: qty 2

## 2016-04-22 MED ORDER — FAT EMULSION 20 % IV EMUL
192.0000 mL | INTRAVENOUS | Status: AC
  Administered 2016-04-22: 192 mL via INTRAVENOUS
  Filled 2016-04-22: qty 200

## 2016-04-22 MED ORDER — LIDOCAINE HCL (CARDIAC) 20 MG/ML IV SOLN
INTRAVENOUS | Status: AC
  Filled 2016-04-22: qty 5

## 2016-04-22 MED ORDER — PROPOFOL 10 MG/ML IV BOLUS
INTRAVENOUS | Status: DC | PRN
  Administered 2016-04-22 (×4): 20 mg via INTRAVENOUS

## 2016-04-22 MED ORDER — REMIFENTANIL HCL 1 MG IV SOLR
INTRAVENOUS | Status: AC
  Filled 2016-04-22: qty 2000

## 2016-04-22 NOTE — Anesthesia Preprocedure Evaluation (Signed)
Anesthesia Evaluation  Patient identified by MRN, date of birth, ID band Patient awake and Patient confused    Reviewed: Allergy & Precautions, NPO status , Patient's Chart, lab work & pertinent test results, Unable to perform ROS - Chart review only  Airway Mallampati: II  TM Distance: >3 FB Neck ROM: Full    Dental no notable dental hx.    Pulmonary neg pulmonary ROS,    Pulmonary exam normal breath sounds clear to auscultation       Cardiovascular hypertension, Pt. on medications Normal cardiovascular exam Rhythm:Regular Rate:Normal     Neuro/Psych negative neurological ROS  negative psych ROS   GI/Hepatic Neg liver ROS, hiatal hernia, GERD  ,  Endo/Other  negative endocrine ROSdiabetes, Type 2  Renal/GU negative Renal ROS  negative genitourinary   Musculoskeletal negative musculoskeletal ROS (+)   Abdominal   Peds negative pediatric ROS (+)  Hematology  (+) anemia ,   Anesthesia Other Findings   Reproductive/Obstetrics negative OB ROS                             Anesthesia Physical  Anesthesia Plan  ASA: III  Anesthesia Plan: MAC   Post-op Pain Management:    Induction:   Airway Management Planned: Nasal Cannula  Additional Equipment:   Intra-op Plan:   Post-operative Plan:   Informed Consent: I have reviewed the patients History and Physical, chart, labs and discussed the procedure including the risks, benefits and alternatives for the proposed anesthesia with the patient or authorized representative who has indicated his/her understanding and acceptance.   Dental advisory given  Plan Discussed with: CRNA  Anesthesia Plan Comments:         Anesthesia Quick Evaluation

## 2016-04-22 NOTE — Op Note (Signed)
[facility] Patient Name: [name] [name] Procedure Date: 04/22/2016 MRN: [id number] Attending MD: Carlota Raspberry. Havery Moros , MD Date of Birth: 1935-05-23 CSN: [id number] Age: 81 Admit Type: Inpatient Procedure:                Upper GI endoscopy Indications:              Diagnostic procedure - follow up for acute                            esophageal necrosis / black esophagus, gastric                            outlet obstruction / pyloric mass Providers:                Carlota Raspberry. Havery Moros, MD, Kingsley Plan, RN,                            Corliss Parish, Technician Referring MD:              Medicines:                Monitored Anesthesia Care Complications:            No immediate complications. Estimated blood loss:                            Minimal. Estimated Blood Loss:     Estimated blood loss was minimal. Procedure:                Pre-Anesthesia Assessment:                           - Prior to the procedure, a History and Physical                            was performed, and patient medications and                            allergies were reviewed. The patient's tolerance of                            previous anesthesia was also reviewed. The risks                            and benefits of the procedure and the sedation                            options and risks were discussed with the patient.                            All questions were answered, and informed consent                            was obtained. Prior Anticoagulants: The patient has  taken no previous anticoagulant or antiplatelet                            agents. ASA Grade Assessment: III - A patient with                            severe systemic disease. After reviewing the risks                            and benefits, the patient was deemed in                            satisfactory condition to undergo the procedure.                           After  obtaining informed consent, the endoscope was                            passed under direct vision. Throughout the                            procedure, the patient's blood pressure, pulse, and                            oxygen saturations were monitored continuously. The                            [id number] scope was introduced through the                            mouth, and advanced to the second part of duodenum.                            The upper GI endoscopy was accomplished without                            difficulty. The patient tolerated the procedure                            well. Scope In: Scope Out: Findings:      Esophagogastric landmarks were identified: the Z-line was found at 37       cm, the gastroesophageal junction was found at 37 cm and the upper       extent of the gastric folds was found at 40 cm from the incisors.      A 3 cm hiatal hernia was present.      Diffuse severe mucosal changes were found in the entire esophagus       consistent with known acute esophageal necrosis. The changes were mildly       improved from the initial exam, the mucosa was not as dusky / dark but       severely ulcerated, and lumen felt narrower, suspected stricture       formation is developing      One non-bleeding gastric ulcer with no stigmata of bleeding was  found in       the gastric body. The lesion was 4 mm in largest dimension.      A severe stenosis was found at the pylorus due to a gastric mass. This       was traversed with the endoscope but the lumen was quite narrow and       duodenal bulb was not well visualized at all. Biopsies were taken with a       cold forceps for histology of the mass.      The exam of the stomach was otherwise normal on reteroflexed views       however small amount of residual gastric contents were noted on her       initial exam remained present.      Few non-bleeding superficial duodenal ulcers with no stigmata of        bleeding were found in the second portion of the duodenum, smaller and       improved from previously noted on initial exam. Impression:               - Esophagogastric landmarks identified.                           - 3 cm hiatal hernia.                           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                           - Gastric stenosis was found at the pylorus due to                            a gastric mass - this does not appear consistent                            with hyperplastic polyp as initially reported but                            possible. Additional biopsies obtained to rule out                            malignancy.                           -  Multiple non-bleeding duodenal ulcers with no                            stigmata of bleeding, improved from previous exam. Moderate Sedation:      No moderate sedation, case performed with MAC Recommendation:           - Return patient to hospital ward for ongoing care.                           - Continue NPO status and TPN.                           - Continue present medications - IV PPI                           - Await pathology results                           - Patient is not a candidate for any tube feeds                            proximal to the pylorus given gastric outlet                            obstruction and acute esophageal necrosis                           - GI service will continue to follow, await                            pathology results to rule out malignancy, high risk                            surgical candidate but will need to discuss case                            with  surgery Procedure Code(s):        --- Professional ---                           [id number], Esophagogastroduodenoscopy, flexible,                            transoral; with biopsy, single or multiple Diagnosis Code(s):        --- Professional ---                           K44.9, Diaphragmatic hernia without obstruction or                            gangrene                           K22.8, Other specified diseases of esophagus  K25.9, Gastric ulcer, unspecified as acute or                            chronic, without hemorrhage or perforation                           K31.1, Adult hypertrophic pyloric stenosis                           K26.9, Duodenal ulcer, unspecified as acute or                            chronic, without hemorrhage or perforation CPT copyright 2016 [employer]. All rights reserved. The codes documented in this report are preliminary and upon coder review may  be revised to meet current compliance requirements. Remo Lipps P. Kelly Ranieri, MD 04/22/2016 8:31:30 AM This report has been signed electronically. Number of Addenda: 0

## 2016-04-22 NOTE — Progress Notes (Signed)
PROGRESS NOTE                                                                                                                                                                                                             Patient Demographics:    [name] [name], is a 81 y.o. female, DOB - July 25, 1935, AP:[id number]  Admit date - 04/15/2016   Admitting Physician Thurnell Lose, MD  Outpatient Primary MD for the patient is [name], MD  LOS - 7  Brief Narrative    81 y.o. female, With history of essential hypertension, diabetes mellitus type 2, dyslipidemia, peptic ulcer disease, poor balance with multiple falls recently started using a walker still lives at home, has been experiencing some dark stools along with generalized weakness.  In the ER patient was found to have heme positive stool, she appeared slightly weak, dehydrated and pale.   Assessment  & Plan :   Acute upper GI bleed with melena and acute blood loss anemia. 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  - CT scan with no evidence of pneumotosis or pneumomediastinum related to the esophagus findings, although she is at risk for this - per GI team, acute esophageal necrosis carries a mortality rate between 15-30% - recommended treatment is supportive care with IVF, broad spectrum antibiotics, and IV PPI.  - Biopsies of pylorus do no show any malignant cells, esophageal  biopsies with acute inflammation, necrotic debri and fungal forms, ? Need for repeat EGD per GI team  - TPN for nutrition continued  - diflucan added per GI team and will continue same regimen - repeat EGD today, will follow up on results  GERD - continue PPI   Generalized weakness with multiple falls.  - PT evaluation requested - plan to d/c to  SNF once medically cleared   Essential hypertension - reasonable inpatient control   Dehydration with hyponatremia and hypokalemia - improved with IVF and supplementation  - repeat levels in AM and supplement as indicated   Leukocytosis - has been on Unasyn  - resolved   Mild hematemesis with right-sided aspiration pneumonitis on 04/16/2016.  - Currently not hypoxic - continue to cover with Unasyn and monitor, plan total duration of of ABX for  7 days   DM type II.  - Continue on sliding scale will change it to every 6 hours since she is nothing by mouth. - A1C is 6.6  Family Communication  :  Pt at bedside   Code Status :  Full  Diet : NPO   Disposition Plan  :  Stay inpt  Consults  :  GI  Procedures  :  EGD x 3   DVT Prophylaxis  :    SCDs    Inpatient Medications  Scheduled Meds: . ampicillin-sulbactam (UNASYN) IV  1.5 g Intravenous Q6H  . antiseptic oral rinse  7 mL Mouth Rinse q12n4p  . bisacodyl  10 mg Rectal Daily  . chlorhexidine  15 mL Mouth Rinse BID  . erythromycin   Both Eyes QHS  . fluconazole (DIFLUCAN) IV  200 mg Intravenous Q24H  . insulin aspart  0-20 Units Subcutaneous Q4H  . insulin aspart  14 Units Subcutaneous Once  . pantoprazole  40 mg Intravenous Q12H  . sodium chloride flush  10-40 mL Intracatheter Q12H  . sodium chloride flush  3 mL Intravenous Q12H   Continuous Infusions: . Marland KitchenTPN (CLINIMIX-E) Adult 60 mL/hr at 04/22/16 1703   And  . fat emulsion 192 mL (04/22/16 1704)  . 0.45 % NaCl with KCl 20 mEq / L 30 mL/hr at 04/22/16 1647   PRN Meds:.albuterol, hydrALAZINE, [DISCONTINUED] ondansetron **OR** ondansetron (ZOFRAN) IV, sodium chloride flush  Antibiotics  :    Anti-infectives    Start     Dose/Rate Route Frequency Ordered Stop   04/20/16 1000  fluconazole (DIFLUCAN) IVPB 200 mg     200 mg 100 mL/hr over 60 Minutes Intravenous Every 24 hours 04/20/16 0929     04/16/16 0900  ampicillin-sulbactam (UNASYN) 1.5 g in sodium  chloride 0.9 % 50 mL IVPB     1.5 g 100 mL/hr over 30 Minutes Intravenous Every 6 hours 04/16/16 0806           Objective:   Vitals:   04/22/16 0845 04/22/16 0853 04/22/16 0909 04/22/16 1416  BP: (!) 128/59 137/64 133/77 121/69  Pulse: 80 78 77 84  Resp: 14 15 14 17   Temp:  97.9 F (36.6 C) 97.8 F (36.6 C) 98.4 F (36.9 C)  TempSrc:    Oral  SpO2: 98% 97% 100% 99%  Weight:      Height:        Wt Readings from Last 3 Encounters:  04/22/16 84.9 kg (187 lb 3.2 oz)  01/20/16 78.5 kg (173 lb)  08/25/15 80.3 kg (177 lb)     Intake/Output Summary (Last 24 hours) at 04/22/16 1922 Last data filed at 04/22/16 1900  Gross per 24 hour  Intake          1462.47 ml  Output             1510 ml  Net           -47.53 ml    Physical Exam Awake , Oriented X 2, NAD Supple Neck,No JVD, No cervical lymphadenopathy appriciated.  Symmetrical Chest wall movement, Good air movement bilaterally, few R.sided rales RRR,No Gallops,Rubs or new Murmurs Abd Soft, No tenderness, No organomegaly appriciated, No rebound - guarding or rigidity.   Data Review:    CBC  Recent Labs Lab 04/17/16 0529 04/18/16 1800 04/19/16 0900 04/20/16 0609 04/21/16 0500 04/21/16 2022  WBC 12.2* 8.3 6.4 6.4 6.3  --   HGB 8.4* 8.5* 7.9* 8.3* 7.3* 9.1*  HCT  25.0* 25.3* 23.3* 25.2* 22.0* 28.2*  PLT 233 247 221 220 198  --   MCV 89.3 88.2 88.3 86.9 88.0  --   MCH 30.0 29.6 29.9 28.6 29.2  --   MCHC 33.6 33.6 33.9 32.9 33.2  --   RDW 17.1* 17.0* 16.8* 16.4* 16.5*  --   LYMPHSABS  --   --  1.0  --   --   --   MONOABS  --   --  0.5  --   --   --   EOSABS  --   --  0.1  --   --   --   BASOSABS  --   --  0.0  --   --   --     Chemistries   Recent Labs Lab 04/18/16 1800 04/19/16 0900 04/20/16 0609 04/21/16 0500 04/22/16 0455  NA 134* 132* 136 133* 133*  K 4.2 3.9 3.5 3.8 4.2  CL 109 107 108 106 104  CO2 21* 22 23 24 25   GLUCOSE 231* 295* 123* 203* 199*  BUN 13 12 12 15 16   CREATININE 0.86 0.81  0.70 0.69 0.71  CALCIUM 8.1* 8.0* 7.9* 7.4* 7.5*  MG 1.3* 1.4* 1.9 1.6* 1.8  AST  --  18  --   --   --   ALT  --  15  --   --   --   ALKPHOS  --  38  --   --   --   BILITOT  --  0.4  --   --   --     Lab Results  Component Value Date   HGBA1C 6.6 (H) 04/15/2016   No results found for: BNP  Micro Results Recent Results (from the past 240 hour(s))  MRSA PCR Screening     Status: None   Collection Time: 04/17/16  6:27 PM  Result Value Ref Range Status   MRSA by PCR NEGATIVE NEGATIVE Final    Radiology Reports Dg Chest 2 View  Result Date: 03/29/2016 CLINICAL DATA:  Hypotension and weakness for 1 week. History of diabetes and high blood pressure. EXAM: CHEST  2 VIEW COMPARISON:  Seventeen 17 FINDINGS: Normal heart size and pulmonary vascularity. No focal airspace disease or consolidation in the lungs. No blunting of costophrenic angles. No pneumothorax. Mediastinal contours appear intact. Degenerative changes in the spine and shoulders. Calcification of the aorta. IMPRESSION: No active cardiopulmonary disease. Electronically Signed   By: Lucienne Capers M.D.   On: 03/29/2016 06:15   Ct Head Wo Contrast  Result Date: 04/15/2016 CLINICAL DATA:  Unwitnessed fall. Fell wall trying again out of bed. Trauma to head without loss of consciousness. Initial encounter. EXAM: CT HEAD WITHOUT CONTRAST CT CERVICAL SPINE WITHOUT CONTRAST TECHNIQUE: Multidetector CT imaging of the head and cervical spine was performed following the standard protocol without intravenous contrast. Multiplanar CT image reconstructions of the cervical spine were also generated. COMPARISON:  CT head without contrast 03/29/2016. CT of the head and face 12/24/2015. FINDINGS: CT HEAD FINDINGS Moderate atrophy and periventricular white matter hypoattenuation is stable. This is slightly advanced for age. The ventricles proportionate to the degree of atrophy. A remote infarct of the right cerebellum is stable. No acute infarct,  hemorrhage, or mass lesion is present. No significant extraaxial fluid collection is present. Minimal soft tissue swelling is present in the left occipital sub scalp without an underlying fracture. The calvarium is intact. A remote right inferior orbital floor fracture has healed. The paranasal sinuses  and mastoid air cells are clear. CT CERVICAL SPINE FINDINGS The cervical spine is imaged from the skullbase through T2-3. Asymmetric degenerative changes are present at the left atlantooccipital joint. Degenerative changes are present at C1-2. There is chronic loss of disc height from C3 through C7. Vertebral body heights and alignment are maintained. Facet hypertrophy is worse on the right. Soft tissues the neck demonstrate thyroid atrophy. Atherosclerotic calcifications are present at the carotid bifurcations bilaterally without significant stenoses. A large right pleural effusion is present. The lung apices are otherwise clear. IMPRESSION: 1. Stable moderate atrophy and white matter disease. No acute intracranial abnormality. 2. Minimal soft tissue swelling in the left occipital scalp may be related to the acute trauma. There is no underlying fracture. The 3. Remote right inferior orbital floor fracture has healed. 4. Multilevel degenerative changes in the cervical spine. 5. New moderate-sized right pleural effusion. Recommend two-view chest x-ray for further evaluation. Electronically Signed   By: San Morelle M.D.   On: 04/15/2016 14:40  Ct Head Wo Contrast  Result Date: 03/29/2016 CLINICAL DATA:  Hypoglycemic and hypotensive for about a week. Fell 6 days ago. Weakness since the fall. Confusion, double vision, and slurred speech for 1 day. Unsteady gait. EXAM: CT HEAD WITHOUT CONTRAST TECHNIQUE: Contiguous axial images were obtained from the base of the skull through the vertex without intravenous contrast. COMPARISON:  12/24/2015 FINDINGS: Mild cerebral atrophy. Ventricular dilatation consistent with  central atrophy. Low-attenuation changes in the deep white matter consistent with small vessel ischemia. No mass effect or midline shift. No abnormal extra-axial fluid collections. Gray-white matter junctions are distinct. Basal cisterns are not effaced. No evidence of acute intracranial hemorrhage. No depressed skull fractures. Visualized paranasal sinuses and mastoid air cells are not opacified. IMPRESSION: No acute intracranial abnormalities. Chronic atrophy and small vessel ischemic changes. Electronically Signed   By: Lucienne Capers M.D.   On: 03/29/2016 04:26   Ct Chest W Contrast  Result Date: 04/17/2016 CLINICAL DATA:  Black esophagus on EGD today.  Rule out necrosis. EXAM: CT CHEST, ABDOMEN, AND PELVIS WITH CONTRAST TECHNIQUE: Multidetector CT imaging of the chest, abdomen and pelvis was performed following the standard protocol during bolus administration of intravenous contrast. CONTRAST:  150mL ISOVUE-300 IOPAMIDOL (ISOVUE-300) INJECTION 61% COMPARISON:  Chest radiographs yesterday. No prior chest CT. CT abdomen/pelvis 08/04/2012 FINDINGS: CT CHEST FINDINGS Cardiovascular: Tortuosity of the thoracic aorta without aneurysm. There is moderate atherosclerosis. Conventional branching pattern from the aortic arch. No aortic dissection. Scattered coronary artery calcifications. Heart is normal in size. Mediastinum/Nodes: There is diffuse esophageal wall thickening from the thoracic inlet through the diaphragmatic hiatus. Intraluminal esophageal fluid. No evidence of pneumatosis. No pneumomediastinum. Small amount of paraesophageal fluid distally may be contiguous with right pleural effusion. Prominent right upper paratracheal node measures 10 mm short axis. Small lower paratracheal nodes not enlarged by size criteria. No evidence of hilar adenopathy. No axillary adenopathy. No pericardial effusion. Lungs/Pleura: Moderate bilateral pleural effusions, right greater than left. There is adjacent compressive  atelectasis in both lower lobes. Mild patchy opacities in the anterior right upper lobe, left upper lobe to a lesser extent and superior segment left lower lobe. There is minimal smooth septal thickening. Trachea and mainstem bronchi are patent. Musculoskeletal: There are no acute or suspicious osseous abnormalities. CT ABDOMEN PELVIS FINDINGS Hepatobiliary: No focal hepatic lesion. Streak artifact from arms down positioning. Question of nodular hepatic contours. Postcholecystectomy. No biliary dilatation. Pancreas: Atrophic parenchyma. No ductal dilatation or inflammation. Spleen: Unremarkable. Adrenals/Urinary Tract: No  adrenal nodule. Symmetric renal enhancement. No hydronephrosis. Motion artifact through the lower left kidney. Contour deformity is likely secondary to lobular contours and motion, less likely focal renal lesion. Urinary bladder is physiologically distended. Stomach/Bowel: Diffuse wall thickening and edematous appearance. Small volume of intraluminal gastric fluid. No pneumatosis. Small bowel is decompressed. No definite small bowel thickening. Extensive colonic diverticulosis of the descending and sigmoid colon without acute diverticulitis. Scattered colonic diverticular throughout the remainder the colon. Appendix tentatively identified and normal. There is mild wall thickening at the rectosigmoid junction with adjacent perirectal fat stranding. Vascular/Lymphatic: Abdominal aortic atherosclerosis. No aneurysm. Circumaortic left renal vein. No retroperitoneal adenopathy. Reproductive: Uterus is atrophic, normal for age.  No adnexal mass. Other: Small fat containing upper ventral abdominal wall hernia. Small amount of ascites adjacent to the liver in the spleen. There is no free air. No intra-abdominal abscess. There is whole body wall edema. Musculoskeletal: Degenerative change throughout spine. There are no acute or suspicious osseous abnormalities. IMPRESSION: 1. Esophagus is diffusely  edematous with diffuse wall thickening and intraluminal fluid. No pneumatosis or pneumomediastinum. No CT findings to suggest necrosis. Similar edematous appearance of the stomach. 2. Moderate bilateral pleural effusions with adjacent compressive atelectasis. Minimal smooth septal thickening may reflect pulmonary edema. 3. Contour deformity of the lower left kidney likely related to lobular contours and motion, difficult to exclude exophytic lesion. Nonemergent renal ultrasound could be considered when patient is able. 4. Rather extensive colonic diverticulosis, no diverticulitis. Rectus sigmoid colonic wall thickening with adjacent edema, suggesting chronic constipation. 5. Question of nodular hepatic contours raising concern for cirrhosis. 6. Whole body wall edema.  Minimal abdominal ascites. 7. Thoracoabdominal aortic atherosclerosis. Coronary artery calcifications. Electronically Signed   By: Jeb Levering M.D.   On: 04/17/2016 22:00   Ct Cervical Spine Wo Contrast  Result Date: 04/15/2016 CLINICAL DATA:  Unwitnessed fall. Fell wall trying again out of bed. Trauma to head without loss of consciousness. Initial encounter. EXAM: CT HEAD WITHOUT CONTRAST CT CERVICAL SPINE WITHOUT CONTRAST TECHNIQUE: Multidetector CT imaging of the head and cervical spine was performed following the standard protocol without intravenous contrast. Multiplanar CT image reconstructions of the cervical spine were also generated. COMPARISON:  CT head without contrast 03/29/2016. CT of the head and face 12/24/2015. FINDINGS: CT HEAD FINDINGS Moderate atrophy and periventricular white matter hypoattenuation is stable. This is slightly advanced for age. The ventricles proportionate to the degree of atrophy. A remote infarct of the right cerebellum is stable. No acute infarct, hemorrhage, or mass lesion is present. No significant extraaxial fluid collection is present. Minimal soft tissue swelling is present in the left occipital sub  scalp without an underlying fracture. The calvarium is intact. A remote right inferior orbital floor fracture has healed. The paranasal sinuses and mastoid air cells are clear. CT CERVICAL SPINE FINDINGS The cervical spine is imaged from the skullbase through T2-3. Asymmetric degenerative changes are present at the left atlantooccipital joint. Degenerative changes are present at C1-2. There is chronic loss of disc height from C3 through C7. Vertebral body heights and alignment are maintained. Facet hypertrophy is worse on the right. Soft tissues the neck demonstrate thyroid atrophy. Atherosclerotic calcifications are present at the carotid bifurcations bilaterally without significant stenoses. A large right pleural effusion is present. The lung apices are otherwise clear. IMPRESSION: 1. Stable moderate atrophy and white matter disease. No acute intracranial abnormality. 2. Minimal soft tissue swelling in the left occipital scalp may be related to the acute trauma. There is no  underlying fracture. The 3. Remote right inferior orbital floor fracture has healed. 4. Multilevel degenerative changes in the cervical spine. 5. New moderate-sized right pleural effusion. Recommend two-view chest x-ray for further evaluation. Electronically Signed   By: San Morelle M.D.   On: 04/15/2016 14:40  Ct Abdomen Pelvis W Contrast  Result Date: 04/17/2016 CLINICAL DATA:  Black esophagus on EGD today.  Rule out necrosis. EXAM: CT CHEST, ABDOMEN, AND PELVIS WITH CONTRAST TECHNIQUE: Multidetector CT imaging of the chest, abdomen and pelvis was performed following the standard protocol during bolus administration of intravenous contrast. CONTRAST:  151mL ISOVUE-300 IOPAMIDOL (ISOVUE-300) INJECTION 61% COMPARISON:  Chest radiographs yesterday. No prior chest CT. CT abdomen/pelvis 08/04/2012 FINDINGS: CT CHEST FINDINGS Cardiovascular: Tortuosity of the thoracic aorta without aneurysm. There is moderate atherosclerosis.  Conventional branching pattern from the aortic arch. No aortic dissection. Scattered coronary artery calcifications. Heart is normal in size. Mediastinum/Nodes: There is diffuse esophageal wall thickening from the thoracic inlet through the diaphragmatic hiatus. Intraluminal esophageal fluid. No evidence of pneumatosis. No pneumomediastinum. Small amount of paraesophageal fluid distally may be contiguous with right pleural effusion. Prominent right upper paratracheal node measures 10 mm short axis. Small lower paratracheal nodes not enlarged by size criteria. No evidence of hilar adenopathy. No axillary adenopathy. No pericardial effusion. Lungs/Pleura: Moderate bilateral pleural effusions, right greater than left. There is adjacent compressive atelectasis in both lower lobes. Mild patchy opacities in the anterior right upper lobe, left upper lobe to a lesser extent and superior segment left lower lobe. There is minimal smooth septal thickening. Trachea and mainstem bronchi are patent. Musculoskeletal: There are no acute or suspicious osseous abnormalities. CT ABDOMEN PELVIS FINDINGS Hepatobiliary: No focal hepatic lesion. Streak artifact from arms down positioning. Question of nodular hepatic contours. Postcholecystectomy. No biliary dilatation. Pancreas: Atrophic parenchyma. No ductal dilatation or inflammation. Spleen: Unremarkable. Adrenals/Urinary Tract: No adrenal nodule. Symmetric renal enhancement. No hydronephrosis. Motion artifact through the lower left kidney. Contour deformity is likely secondary to lobular contours and motion, less likely focal renal lesion. Urinary bladder is physiologically distended. Stomach/Bowel: Diffuse wall thickening and edematous appearance. Small volume of intraluminal gastric fluid. No pneumatosis. Small bowel is decompressed. No definite small bowel thickening. Extensive colonic diverticulosis of the descending and sigmoid colon without acute diverticulitis. Scattered  colonic diverticular throughout the remainder the colon. Appendix tentatively identified and normal. There is mild wall thickening at the rectosigmoid junction with adjacent perirectal fat stranding. Vascular/Lymphatic: Abdominal aortic atherosclerosis. No aneurysm. Circumaortic left renal vein. No retroperitoneal adenopathy. Reproductive: Uterus is atrophic, normal for age.  No adnexal mass. Other: Small fat containing upper ventral abdominal wall hernia. Small amount of ascites adjacent to the liver in the spleen. There is no free air. No intra-abdominal abscess. There is whole body wall edema. Musculoskeletal: Degenerative change throughout spine. There are no acute or suspicious osseous abnormalities. IMPRESSION: 1. Esophagus is diffusely edematous with diffuse wall thickening and intraluminal fluid. No pneumatosis or pneumomediastinum. No CT findings to suggest necrosis. Similar edematous appearance of the stomach. 2. Moderate bilateral pleural effusions with adjacent compressive atelectasis. Minimal smooth septal thickening may reflect pulmonary edema. 3. Contour deformity of the lower left kidney likely related to lobular contours and motion, difficult to exclude exophytic lesion. Nonemergent renal ultrasound could be considered when patient is able. 4. Rather extensive colonic diverticulosis, no diverticulitis. Rectus sigmoid colonic wall thickening with adjacent edema, suggesting chronic constipation. 5. Question of nodular hepatic contours raising concern for cirrhosis. 6. Whole body wall edema.  Minimal  abdominal ascites. 7. Thoracoabdominal aortic atherosclerosis. Coronary artery calcifications. Electronically Signed   By: Jeb Levering M.D.   On: 04/17/2016 22:00   Dg Chest Port 1 View  Result Date: 04/16/2016 CLINICAL DATA:  Increased cough and congestion this morning. EXAM: PORTABLE CHEST 1 VIEW COMPARISON:  Chest x-rays dated 04/15/2016 and 03/29/2016. FINDINGS: Today's exam is slightly  hypoinspiratory with crowding of the perihilar bronchovascular markings. New patchy airspace opacities are seen at the right lung base. Left lung remains clear. Cardiomediastinal silhouette is stable. Atherosclerotic changes noted at the aortic arch. Osseous structures about the chest are unremarkable. IMPRESSION: Low lung volumes. New patchy airspace opacities at the right lung base, atelectasis versus early developing pneumonia. Aortic atherosclerosis. Electronically Signed   By: Franki Cabot M.D.   On: 04/16/2016 08:16  Dg Chest Port 1 View  Result Date: 04/15/2016 CLINICAL DATA:  Unwitnessed fall today.  Pleural effusion. EXAM: PORTABLE CHEST 1 VIEW COMPARISON:  03/29/2016 FINDINGS: Heart is borderline in size. Mild vascular congestion. Bibasilar atelectasis. No visible effusions or pneumothorax. No acute bony abnormality. IMPRESSION: Borderline heart size with vascular congestion. Bibasilar atelectasis. Electronically Signed   By: Rolm Baptise M.D.   On: 04/15/2016 15:19  Dg Chest Portable 1 View  Result Date: 03/26/2016 CLINICAL DATA:  Weakness, hypotension EXAM: PORTABLE CHEST 1 VIEW COMPARISON:  06/03/2008 FINDINGS: Cardiomediastinal silhouette is stable. No acute infiltrate or pleural effusion. No pulmonary edema. Mild degenerative changes thoracic spine. Degenerative changes bilateral shoulders. IMPRESSION: No active disease. Electronically Signed   By: Lahoma Crocker M.D.   On: 03/26/2016 15:22   Dg Abd Portable 1v  Result Date: 04/16/2016 CLINICAL DATA:  Melena. Ongoing heartburn. Pelvic pain since this morning. EXAM: PORTABLE ABDOMEN - 1 VIEW COMPARISON:  None FINDINGS: Moderate stool burden throughout the colon. Nonobstructive bowel gas pattern. No free air, organomegaly or suspicious calcification. Degenerative changes in the lumbar spine and hips. No acute bony abnormality. IMPRESSION: Moderate stool burden.  No acute findings. Electronically Signed   By: Rolm Baptise M.D.   On: 04/16/2016  09:30   Time Spent in minutes  30  MAGICK-Jazzlyn Huizenga M.D on 04/22/2016 at 7:22 PM  Between 7am to 7pm - Pager - [phone]  After 7pm go to [email or web address] - password [facility]  [facility] -  Office  [phone]

## 2016-04-22 NOTE — Progress Notes (Signed)
PARENTERAL NUTRITION CONSULT NOTE - Follow Up  Pharmacy Consult for TPN Indication: gastric outlet obstruction  Allergies  Allergen Reactions  . Ace Inhibitors Cough  . Prednisone     REACTION: lethargic  . Sitagliptin     Other reaction(s): Unknown    Patient Measurements: Height: 5\' 2"  (157.5 cm) Weight: 187 lb 3.2 oz (84.9 kg) IBW/kg (Calculated) : 50.1 Adjusted Body Weight: 72.2 kg  Vital Signs: Temp: 97.4 F (36.3 C) (08/06 0438) Temp Source: Oral (08/06 0438) BP: 138/51 (08/06 0438) Pulse Rate: 77 (08/06 0438) Intake/Output from previous day: 08/05 0701 - 08/06 0700 In: 1800 [I.V.:780; Blood:670; IV Piggyback:350] Out: [id number] [Urine:1060] Intake/Output from this shift: No intake/output data recorded.  Labs:  Recent Labs  04/19/16 0900 04/20/16 0609 04/21/16 0500 04/21/16 2022  WBC 6.4 6.4 6.3  --   HGB 7.9* 8.3* 7.3* 9.1*  HCT 23.3* 25.2* 22.0* 28.2*  PLT 221 220 198  --      Recent Labs  04/19/16 0900 04/20/16 0609 04/21/16 0500 04/22/16 0455  NA 132* 136 133* 133*  K 3.9 3.5 3.8 4.2  CL 107 108 106 104  CO2 22 23 24 25   GLUCOSE 295* 123* 203* 199*  BUN 12 12 15 16   CREATININE 0.81 0.70 0.69 0.71  CALCIUM 8.0* 7.9* 7.4* 7.5*  MG 1.4* 1.9 1.6* 1.8  PHOS 1.8*  --  2.6  --   PROT 4.6*  --   --   --   ALBUMIN 2.0*  --   --   --   AST 18  --   --   --   ALT 15  --   --   --   ALKPHOS 38  --   --   --   BILITOT 0.4  --   --   --   PREALBUMIN 7.2*  --   --   --   TRIG 126  --   --   --    Estimated Creatinine Clearance: 58.6 mL/min (by C-G formula based on SCr of 0.8 mg/dL).    Recent Labs  04/21/16 2004 04/22/16 0036 04/22/16 0437  GLUCAP 183* 178* 214*    Medical History: Past Medical History:  Diagnosis Date  . DDD (degenerative disc disease), lumbar   . Diabetes mellitus   . Diverticulosis   . Endocervical polyp   . Endometrial polyp   . GERD (gastroesophageal reflux disease)   . Hiatal hernia   . Hx of adenomatous  colonic polyps   . Hyperlipidemia   . Hypertension   . Hyponatremia     Medications:  Scheduled:  . ampicillin-sulbactam (UNASYN) IV  1.5 g Intravenous Q6H  . antiseptic oral rinse  7 mL Mouth Rinse q12n4p  . bisacodyl  10 mg Rectal Daily  . chlorhexidine  15 mL Mouth Rinse BID  . erythromycin   Both Eyes QHS  . fluconazole (DIFLUCAN) IV  200 mg Intravenous Q24H  . insulin aspart  0-20 Units Subcutaneous Q4H  . insulin aspart  14 Units Subcutaneous Once  . pantoprazole  40 mg Intravenous Q12H  . sodium chloride flush  10-40 mL Intracatheter Q12H  . sodium chloride flush  3 mL Intravenous Q12H   Infusions:  . Marland KitchenTPN (CLINIMIX-E) Adult 60 mL/hr at 04/22/16 0730   And  . fat emulsion 192 mL (04/21/16 1708)  . 0.45 % NaCl with KCl 20 mEq / L 30 mL/hr at 04/21/16 0900   PRN: albuterol, hydrALAZINE, [DISCONTINUED] ondansetron **OR** ondansetron (ZOFRAN)  IV, sodium chloride flush  Insulin Requirements: 22 units Novolog required since TPN rate increased last PM  Current Nutrition: NPO  IVF: 0.45%NS with 20KCl at 50 ml/hr  Central access: PICC placed 8/2 TPN start date: 8/2  ASSESSMENT                                                                                                          HPI: 81 y.o.female,With history of essential hypertension, diabetes mellitus type 2, dyslipidemia, peptic ulcer disease, poor balance with multiple falls.  Admitted for dark stools which were heme positive with generalized weakness. EGD 7/31 diffuse esophagitis and a black necrotic appearing esophagus, pyloric mass causing gastric outlet obstruction. Biopsies pending. May need surgery. GI suggests TPN.  Significant events:   04/22/2016:   Repeat EGD planned for today  Glucose: elevated >goal rate of 150 since rate increased yesterday; PTA glipizide & metformin on hold  Electrolytes:   Sodium slightly <goal  Magnesium wnl after repleted yesterday  K+/Phos wnl  Corrected Ca WNL   Renal:  SCr WNL  LFTs: Tbili slightly elevated on 7/30 but improved to WNL   TGs: 126 on 8/3  Prealbumin: 7.2 on 8/3  NUTRITIONAL GOALS                                                                                             RD recs 7/31: 1250-1400 kcal, 70-85g protein per day Clinimix E 5/15 at a goal rate of 28ml/hr + 20% fat emulsion at 64ml/hr to provide: 72g/day protein, 1406Kcal/day.  PLAN                                                                                                                          At 1800 today:  Continue Clinimix E 5/15 at 60 ml/hr (goal rate)  20% fat emulsion at 31ml/hr  TPN to contain standard multivitamins and trace elements.  Add regular insulin to TPN to receive 25 units per 24hr  IVF to 94ml/hr - consider change to NS  Continue SSI resistant scale q4h.   TPN lab panels on Mondays & Thursdays.  Netta Cedars, PharmD, BCPS Pager: [phone] 04/22/2016,8:17 AM

## 2016-04-22 NOTE — Transfer of Care (Signed)
Immediate Anesthesia Transfer of Care Note  Patient: [name] [name]  Procedure(s) Performed: Procedure(s): ESOPHAGOGASTRODUODENOSCOPY (EGD) (N/A)  Patient Location: PACU  Anesthesia Type:MAC  Level of Consciousness: awake, oriented and patient cooperative  Airway & Oxygen Therapy: Patient Spontanous Breathing and Patient connected to nasal cannula oxygen  Post-op Assessment: Report given to RN, Post -op Vital signs reviewed and stable and Patient moving all extremities X 4  Post vital signs: stable  Last Vitals:  Vitals:   04/22/16 0438 04/22/16 0826  BP: (!) 138/51 (!) 150/60  Pulse: 77 83  Resp: 20 17  Temp: 36.3 C 36.9 C    Last Pain:  Vitals:   04/22/16 0826  TempSrc:   PainSc: Asleep         Complications: No apparent anesthesia complications

## 2016-04-22 NOTE — Progress Notes (Signed)
cbg 193

## 2016-04-22 NOTE — Anesthesia Postprocedure Evaluation (Signed)
Anesthesia Post Note  Patient: [name] [name]  Procedure(s) Performed: Procedure(s) (LRB): ESOPHAGOGASTRODUODENOSCOPY (EGD) (N/A)  Patient location during evaluation: PACU Anesthesia Type: MAC Level of consciousness: awake and alert Pain management: pain level controlled Vital Signs Assessment: post-procedure vital signs reviewed and stable Respiratory status: spontaneous breathing, nonlabored ventilation, respiratory function stable and patient connected to nasal cannula oxygen Cardiovascular status: stable and blood pressure returned to baseline Anesthetic complications: no    Last Vitals:  Vitals:   04/22/16 0853 04/22/16 0909  BP: 137/64 133/77  Pulse: 78 77  Resp: 15 14  Temp: 36.6 C 36.6 C    Last Pain:  Vitals:   04/22/16 0826  TempSrc:   PainSc: Asleep                 Montez Hageman

## 2016-04-22 NOTE — H&P (View-Only) (Signed)
Patient ID: [name] [name], female   DOB: July 22, 1935, 81 y.o.   MRN: [id number]    Progress Note   Subjective  Feels ok, says abdomen bothers her off and on, no  nausea or vomiting- denies CP,heartburn   Objective   Vital signs in last 24 hours: Temp:  [97.5 F (36.4 C)-97.9 F (36.6 C)] 97.7 F (36.5 C) (08/05 0416) Pulse Rate:  [77-84] 77 (08/05 0416) Resp:  [18-20] 20 (08/05 0416) BP: (107-147)/(50-56) 121/50 (08/05 0416) SpO2:  [94 %-100 %] 97 % (08/05 0416) Weight:  [172 lb 1.6 oz (78.1 kg)] 172 lb 1.6 oz (78.1 kg) (08/05 0416) Last BM Date: 04/19/16 General:    Elderly white female in NAD  pale  Heart:  Regular rate and rhythm; no murmurs Lungs: Respirations even and unlabored, lungs CTA bilaterally Abdomen:  Soft,large , nontender and nondistended. Normal bowel sounds. Extremities: edematous Neurologic:  Alert and oriented,  grossly normal neurologically. Psych:  Cooperative. Normal mood and affect.  Intake/Output from previous day: 08/04 0701 - 08/05 0700 In: 1835.7 [I.V.:1835.7] Out: 650 [Urine:650] Intake/Output this shift: No intake/output data recorded.  Lab Results:  Recent Labs  04/19/16 0900 04/20/16 0609 04/21/16 0500  WBC 6.4 6.4 6.3  HGB 7.9* 8.3* 7.3*  HCT 23.3* 25.2* 22.0*  PLT 221 220 198   BMET  Recent Labs  04/19/16 0900 04/20/16 0609 04/21/16 0500  NA 132* 136 133*  K 3.9 3.5 3.8  CL 107 108 106  CO2 22 23 24   GLUCOSE 295* 123* 203*  BUN 12 12 15   CREATININE 0.81 0.70 0.69  CALCIUM 8.0* 7.9* 7.4*   LFT  Recent Labs  04/19/16 0900  PROT 4.6*  ALBUMIN 2.0*  AST 18  ALT 15  ALKPHOS 38  BILITOT 0.4   PT/INR No results for input(s): LABPROT, INR in the last 72 hours.  Studies/Results: No results found.     Assessment / Plan:    #1  81 yo female with partial GOO secondary secondary to pyloric lesion, and severe esophagitis with necrosis /Black esophagus. Bx of pylorus did not prove malignancy Esophageal bx with  ulceration,nectrotic debri and fungal forms  #2 anemia- hgb drifted to 7.3 this am- will give one unit #3 Right pneumonia secondary to aspiration - on Unasyn #4 AODM #5 AMS- resolved   Plan; Continue NPO today  On TPN  Continue Unasyn, Diflucan ,PPI, BID Scheduling for EGD with Dr Havery Moros in am to reassess and bx   Principal Problem:   Melena Active Problems:   DM2 (diabetes mellitus, type 2) ([place])   GERD without esophagitis   Fall at home   Gastric outlet obstruction     LOS: 6 days   Amy Esterwood  04/21/2016, 9:20 AM

## 2016-04-22 NOTE — Interval H&P Note (Signed)
History and Physical Interval Note:  04/22/2016 7:31 AM  [name] [name]  has presented today for surgery, with the diagnosis of Gastric Outlet Obstruction  The various methods of treatment have been discussed with the patient and family. After consideration of risks, benefits and other options for treatment, the patient has consented to  Procedure(s): ESOPHAGOGASTRODUODENOSCOPY (EGD) (N/A) as a surgical intervention .  The patient's history has been reviewed, patient examined, no change in status, stable for surgery.  I have reviewed the patient's chart and labs.  Questions were answered to the patient's satisfaction.     [name]

## 2016-04-23 DIAGNOSIS — K209 Esophagitis, unspecified: Secondary | ICD-10-CM

## 2016-04-23 DIAGNOSIS — D490 Neoplasm of unspecified behavior of digestive system: Secondary | ICD-10-CM | POA: Diagnosis present

## 2016-04-23 DIAGNOSIS — K317 Polyp of stomach and duodenum: Secondary | ICD-10-CM

## 2016-04-23 LAB — GLUCOSE, CAPILLARY
GLUCOSE-CAPILLARY: 164 mg/dL — AB (ref 65–99)
GLUCOSE-CAPILLARY: 168 mg/dL — AB (ref 65–99)
Glucose-Capillary: 189 mg/dL — ABNORMAL HIGH (ref 65–99)
Glucose-Capillary: 192 mg/dL — ABNORMAL HIGH (ref 65–99)
Glucose-Capillary: 162 mg/dL — ABNORMAL HIGH (ref 65–99)
Glucose-Capillary: 146 mg/dL — ABNORMAL HIGH (ref 65–99)
Glucose-Capillary: 146 mg/dL — ABNORMAL HIGH (ref 65–99)
Glucose-Capillary: 162 mg/dL — ABNORMAL HIGH (ref 65–99)
Glucose-Capillary: 174 mg/dL — ABNORMAL HIGH (ref 65–99)

## 2016-04-23 LAB — DIFFERENTIAL
BASOS PCT: 0 %
Basophils Absolute: 0 10*3/uL (ref 0.0–0.1)
EOS ABS: 0.1 10*3/uL (ref 0.0–0.7)
Eosinophils Relative: 2 %
Lymphocytes Relative: 22 %
Lymphs Abs: 1.4 10*3/uL (ref 0.7–4.0)
MONO ABS: 0.6 10*3/uL (ref 0.1–1.0)
MONOS PCT: 10 %
NEUTROS ABS: 4 10*3/uL (ref 1.7–7.7)
Neutrophils Relative %: 66 %

## 2016-04-23 LAB — TYPE AND SCREEN
ABO/RH(D): A POS
ANTIBODY SCREEN: NEGATIVE
Unit division: 0

## 2016-04-23 LAB — COMPREHENSIVE METABOLIC PANEL
ALK PHOS: 41 U/L (ref 38–126)
ALT: 13 U/L — AB (ref 14–54)
ANION GAP: 3 — AB (ref 5–15)
AST: 19 U/L (ref 15–41)
Albumin: 1.9 g/dL — ABNORMAL LOW (ref 3.5–5.0)
BUN: 18 mg/dL (ref 6–20)
CHLORIDE: 104 mmol/L (ref 101–111)
CO2: 26 mmol/L (ref 22–32)
CREATININE: 0.66 mg/dL (ref 0.44–1.00)
Calcium: 7.6 mg/dL — ABNORMAL LOW (ref 8.9–10.3)
GLUCOSE: 131 mg/dL — AB (ref 65–99)
Potassium: 3.9 mmol/L (ref 3.5–5.1)
SODIUM: 133 mmol/L — AB (ref 135–145)
Total Bilirubin: 0.5 mg/dL (ref 0.3–1.2)
Total Protein: 4.6 g/dL — ABNORMAL LOW (ref 6.5–8.1)

## 2016-04-23 LAB — TRIGLYCERIDES: Triglycerides: 84 mg/dL (ref <150)

## 2016-04-23 LAB — CBC
HEMATOCRIT: 27.2 % — AB (ref 36.0–46.0)
Hemoglobin: 9.1 g/dL — ABNORMAL LOW (ref 12.0–15.0)
MCH: 29.3 pg (ref 26.0–34.0)
MCHC: 33.5 g/dL (ref 30.0–36.0)
MCV: 87.5 fL (ref 78.0–100.0)
Platelets: 174 10*3/uL (ref 150–400)
RBC: 3.11 MIL/uL — ABNORMAL LOW (ref 3.87–5.11)
RDW: 16.2 % — AB (ref 11.5–15.5)
WBC: 6 10*3/uL (ref 4.0–10.5)

## 2016-04-23 LAB — PHOSPHORUS: Phosphorus: 2.5 mg/dL (ref 2.5–4.6)

## 2016-04-23 LAB — PREALBUMIN: Prealbumin: 8.8 mg/dL — ABNORMAL LOW (ref 18–38)

## 2016-04-23 LAB — MAGNESIUM: Magnesium: 1.6 mg/dL — ABNORMAL LOW (ref 1.7–2.4)

## 2016-04-23 MED ORDER — M.V.I. ADULT IV INJ
INJECTION | INTRAVENOUS | Status: DC
  Filled 2016-04-23: qty 1440

## 2016-04-23 MED ORDER — TRACE MINERALS CR-CU-MN-SE-ZN 10-1000-500-60 MCG/ML IV SOLN
INTRAVENOUS | Status: AC
  Administered 2016-04-23: 17:00:00 via INTRAVENOUS
  Filled 2016-04-23: qty 1440

## 2016-04-23 MED ORDER — MAGNESIUM SULFATE IN D5W 1-5 GM/100ML-% IV SOLN
1.0000 g | Freq: Once | INTRAVENOUS | Status: AC
  Administered 2016-04-23: 1 g via INTRAVENOUS
  Filled 2016-04-23: qty 100

## 2016-04-23 MED ORDER — FAT EMULSION 20 % IV EMUL
192.0000 mL | INTRAVENOUS | Status: AC
  Administered 2016-04-23: 192 mL via INTRAVENOUS
  Filled 2016-04-23: qty 250

## 2016-04-23 MED ORDER — POTASSIUM PHOSPHATES 15 MMOLE/5ML IV SOLN
10.0000 mmol | Freq: Once | INTRAVENOUS | Status: AC
  Administered 2016-04-23: 10 mmol via INTRAVENOUS
  Filled 2016-04-23: qty 3.33

## 2016-04-23 MED ORDER — FAT EMULSION 20 % IV EMUL
192.0000 mL | INTRAVENOUS | Status: DC
  Filled 2016-04-23: qty 200
  Filled 2016-04-23: qty 250

## 2016-04-23 MED ORDER — MORPHINE SULFATE (PF) 2 MG/ML IV SOLN
1.0000 mg | INTRAVENOUS | Status: DC | PRN
  Administered 2016-04-23: 1 mg via INTRAVENOUS
  Filled 2016-04-23: qty 1

## 2016-04-23 NOTE — Progress Notes (Signed)
Physical Therapy Treatment Patient Details Name: [name] [name] MRN: [id number] DOB: Sep 14, 1935 Today's Date: 04/23/2016    History of Present Illness 81 yo female admitted with melena, hyperglycemia, fall, AMS. hx of essential HTN, DM, multiple falls.    PT Comments    Progressing with mobility. Pt looks much better today compared to last few session. She participated well with therapy. She was able to walk ~35 feet with the RW. Continue to recommend ST rehab at [facility].   Follow Up Recommendations  SNF     Equipment Recommendations  None recommended by PT    Recommendations for Other Services OT consult     Precautions / Restrictions Precautions Precautions: Fall Restrictions Weight Bearing Restrictions: No    Mobility  Bed Mobility Overal bed mobility: Needs Assistance Bed Mobility: Supine to Sit     Supine to sit: [facility] elevated;Min assist     General bed mobility comments: Increased time. Assist for upper body and to scoot to EOB. Pt used bedrail to assist as well.   Transfers Overall transfer level: Needs assistance Equipment used: Rolling walker (2 wheeled) Transfers: Sit to/from Stand Sit to Stand: Min assist;From elevated surface Stand pivot transfers: Min assist       General transfer comment: Assist to rise, stabilize, control descent. VCs safety, hand placement Standpivot , bed >bsc, with RW. Increased time.   Ambulation/Gait Ambulation/Gait assistance: Min assist Ambulation Distance (Feet): 35 Feet Assistive device: Rolling walker (2 wheeled) Gait Pattern/deviations: Step-through pattern;Decreased stride length     General Gait Details: assist to stabilize. Pt fatigues fairly easily. chair follow helpful.   Stairs            Wheelchair Mobility    Modified Rankin (Stroke Patients Only)       Balance Overall balance assessment: Needs assistance         Standing balance support: Bilateral upper extremity supported Standing  balance-Leahy Scale: Poor                      Cognition Arousal/Alertness: Awake/alert Behavior During Therapy: WFL for tasks assessed/performed Overall Cognitive Status: Within Functional Limits for tasks assessed                      Exercises      General Comments        Pertinent Vitals/Pain Pain Assessment: Unrated. L and R thigh pinched by BSC seat when she sat down. Noted red area on posterior bil thighs.     Home Living                      Prior Function            PT Goals (current goals can now be found in the care plan section) Progress towards PT goals: Progressing toward goals    Frequency  Min 3X/week    PT Plan Current plan remains appropriate    Co-evaluation             End of Session Equipment Utilized During Treatment: Gait belt Activity Tolerance: Patient limited by fatigue Patient left: in chair;with call bell/phone within reach;with chair alarm set     Time: 1012-1032 PT Time Calculation (min) (ACUTE ONLY): 20 min  Charges:  $Gait Training: 8-22 mins                    G Codes:      Weston Anna, MPT Pager:  [phone]   

## 2016-04-23 NOTE — Progress Notes (Signed)
     [facility] Progress Note  Subjective:  Feels ok.  No new complaints.  Objective:  Vital signs in last 24 hours: Temp:  [98.1 F (36.7 C)-98.5 F (36.9 C)] 98.5 F (36.9 C) (08/07 0447) Pulse Rate:  [76-84] 76 (08/07 0447) Resp:  [17-18] 18 (08/07 0447) BP: (121-140)/(57-69) 137/57 (08/07 0447) SpO2:  [96 %-99 %] 96 % (08/07 0447) Weight:  [163 lb 12.8 oz (74.3 kg)] 163 lb 12.8 oz (74.3 kg) (08/07 0500) Last BM Date: 04/21/16 General:  Alert, Well-developed, in NAD Heart:  Regular rate and rhythm; no murmurs Pulm:  CTAB.  No W/R/R. Abdomen:  Soft, non-distended.  BS present.  Mild epigastric TTP.   Extremities:  Without edema. Neurologic:  Alert and oriented x 4;  grossly normal neurologically. Psych:  Alert and cooperative. Normal mood and affect.  Intake/Output from previous day: 08/06 0701 - 08/07 0700 In: 1684.5 [I.V.:1584.5; IV Piggyback:100] Out: 1600 [Urine:1600]  Lab Results:  Recent Labs  04/21/16 0500 04/21/16 2022 04/23/16 0430  WBC 6.3  --  6.0  HGB 7.3* 9.1* 9.1*  HCT 22.0* 28.2* 27.2*  PLT 198  --  174   BMET  Recent Labs  04/21/16 0500 04/22/16 0455 04/23/16 0430  NA 133* 133* 133*  K 3.8 4.2 3.9  CL 106 104 104  CO2 24 25 26   GLUCOSE 203* 199* 131*  BUN 15 16 18   CREATININE 0.69 0.71 0.66  CALCIUM 7.4* 7.5* 7.6*   LFT  Recent Labs  04/23/16 0430  PROT 4.6*  ALBUMIN 1.9*  AST 19  ALT 13*  ALKPHOS 41  BILITOT 0.5   Assessment / Plan: #1  81 yo female with partial GOO secondary to pyloric lesion and severe esophagitis with necrosis/black esophagus. Bx of pylorus did not prove malignancy. Esophageal bx with ulceration, nectrotic debri, and fungal forms.  Repeat EGD 8/6 showed mild improvement of the esophagus (? Stricture development).  On TNA, Diflucan, BID PPI-continue.  Await repeat path. #2 anemia- hgb stable this AM. #3 Right pneumonia secondary to aspiration - on Unasyn #4 AODM #5 AMS- resolved    LOS:  8 days   ZEHR, JESSICA D.  04/23/2016, 11:07 AM  Pager number [phone]     [facility] Attending   I have taken an interval history, reviewed the chart and examined the patient. I agree with the Advanced Practitioner's note, impression and recommendations.    Biopsies again show a hyperplastic polyp in antrum. She is better overall. Will need to consider if EUS is an option but needs to wait for esophagus to hel more I would think.  Surgery may be needed but would not pursue that right now - without more certainty of dx and overall improvement.  Gatha Mayer, MD, [facility] [phone] (pager) [phone] after 5 PM, weekends and holidays  04/23/2016 9:21 PM

## 2016-04-23 NOTE — Progress Notes (Signed)
Nutrition Follow-up  DOCUMENTATION CODES:   Not applicable  INTERVENTION:  - Continue TPN per pharmacy. - Diet advancement as medically feasible.  - RD will follow-up 8/9.  NUTRITION DIAGNOSIS:   Inadequate oral intake related to inability to eat as evidenced by NPO status. -ongoing  GOAL:   Patient will meet greater than or equal to 90% of their needs -met with current TPN regimen.  MONITOR:   Diet advancement, Weight trends, Labs, I & O's, Other (Comment) (TPN regimen)  ASSESSMENT:   [name] [name]  is a 81 y.o. female, With history of essential hypertension, diabetes mellitus type 2, dyslipidemia, peptic ulcer disease, poor balance with multiple falls recently started using a walker still lives at home whose been experiencing some dark stools for the last few days along with generalized weakness.  8/7 Pt's weight now trending back toward admission weight (+3.1 kg since admission). Pt remains NPO since previous assessment. She underwent repeat EGD yesterday AM with findings of:  3 cm hiatal hernia. 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 Non-bleeding gastric ulcer with no stigmata of bleeding. Gastric stenosis was found at the pylorus due to a gastric mass - this does not appear consistent with hyperplastic polyp as initially reported but possible. Additional biopsies obtained to rule out malignancy. Multiple non-bleeding duodenal ulcers with no stigmata of bleeding, improved from previous exam.  Pt currently receiving goal rate of TPN: Clinimix E 5/15 @ 60 mL/hr with 20% lipids @ 10 mL/hr. This regimen is providing 72 grams of protein, 1406 kcal to meet 100% estimated protein and kcal needs. Per pharmacy, plan to continue this order at this time.   Medications reviewed; sliding scale Novolog, 1 g IV Mg sulfate x1 dose today, 40 mg Protonix BID, 10 mmol IV KPhos x1  dose today. Labs reviewed; CBGs: 146-164 mg/dL this AM, Na: 133 mmol/L, Ca: 7.6 mg/dL, Mg: 1.6 mg/dL, LFTs WDL, triglycerides WDL (84 mg/dL). IVF: 1/2 NS-20 mEq KCl @ 30 mL/hr.     8/4 - Pt on CLD 7/31-8/1 at which time she was made NPO.  - TPN initiated yesterday d/t gastric outlet obstruction and is s/p biopsy.  - Pt does not have NGT.  - GI note this AM states: Esophageal, gastric and duodenal bx - no maligancy, no hpylori-esophagus with ulcerated mucosa, necrotic debri and fungi - Per chart review, pt weight trending up since admission and CBW is +7.3 kg from admission weight.  Pt currently receiving Clinimix E 5/15 @ 40 mL/hr with 20% lipids @ 10 mL/hr. Per pharmacy note this AM, plan to increase TPN to goal rate today at 1800: Clinimix E 5/15 @ 60 mL/hr with 20% lipids @ 10 mL/hr and 8 units insulin in TPN bag. This regimen will provide 1502 kcal (107% estimated kcal need), 72 grams of protein (100% estimated protein need).   IVF: 1/2 NS-20 mEq KCl @ 50 mL/hr.    8/3 Pt is now NPO on TPN Wt is up 5kg since admission. No nausea/vomiting Had a BM yesterday. 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 Awaiting gastric biopsies.   Diet Order:  Diet NPO time specified .TPN (CLINIMIX-E) Adult  Skin:  Reviewed, no issues (L arm wound)  Last BM:  8/5  Height:   Ht Readings from Last 1 Encounters:  04/19/16 '5\' 2"'$  (1.575 m)  Weight:   Wt Readings from Last 1 Encounters:  04/23/16 163 lb 12.8 oz (74.3 kg)    Ideal Body Weight:  50 kg  BMI:  Body mass index is 29.96 kg/m.  Estimated Nutritional Needs:   Kcal:  1250-1400 calories  Protein:  70-85 grams  Fluid:  >/= 1.25L  EDUCATION NEEDS:   No education needs identified at this time    Jarome Matin, MS, RD, LDN Inpatient Clinical Dietitian Pager # [phone] After hours/weekend pager #  [phone]

## 2016-04-23 NOTE — Care Management Note (Signed)
Case Management Note  Patient Details  Name: [name] [name] MRN: [id number] Date of Birth: [birth year]  Subjective/Objective:   GI following.egd-improvement of esophagus,?stricture, await repeat path.Gastric Outlet Obstruction.d/c plan SNF.                 Action/Plan:d/c SNF.   Expected Discharge Date:                  Expected Discharge Plan:  Skilled Nursing Facility  In-House Referral:  Clinical Social Work  Discharge planning Services  CM Consult  Post Acute Care Choice:    Choice offered to:     DME Arranged:    DME Agency:     HH Arranged:    [place] Agency:     Status of Service:  In process, will continue to follow  If discussed at Long Length of Stay Meetings, dates discussed:    Additional Comments:  Dessa Phi, RN 04/23/2016, 2:45 PM

## 2016-04-23 NOTE — Progress Notes (Signed)
PARENTERAL NUTRITION CONSULT NOTE - Follow Up  Pharmacy Consult for TPN Indication: gastric outlet obstruction  Allergies  Allergen Reactions  . Ace Inhibitors Cough  . Prednisone     REACTION: lethargic  . Sitagliptin     Other reaction(s): Unknown    Patient Measurements: Height: 5\' 2"  (157.5 cm) Weight: 163 lb 12.8 oz (74.3 kg) IBW/kg (Calculated) : 50.1 Adjusted Body Weight: 72.2 kg  Vital Signs: Temp: 98.5 F (36.9 C) (08/07 0447) Temp Source: Oral (08/07 0447) BP: 137/57 (08/07 0447) Pulse Rate: 76 (08/07 0447) Intake/Output from previous day: 08/06 0701 - 08/07 0700 In: 1684.5 [I.V.:1584.5; IV Piggyback:100] Out: 1600 [Urine:1600] Intake/Output from this shift: No intake/output data recorded.  Labs:  Recent Labs  04/21/16 0500 04/21/16 2022 04/23/16 0430  WBC 6.3  --  6.0  HGB 7.3* 9.1* 9.1*  HCT 22.0* 28.2* 27.2*  PLT 198  --  174     Recent Labs  04/21/16 0500 04/22/16 0455 04/23/16 0430  NA 133* 133* 133*  K 3.8 4.2 3.9  CL 106 104 104  CO2 24 25 26   GLUCOSE 203* 199* 131*  BUN 15 16 18   CREATININE 0.69 0.71 0.66  CALCIUM 7.4* 7.5* 7.6*  MG 1.6* 1.8 1.6*  PHOS 2.6  --  2.5  PROT  --   --  4.6*  ALBUMIN  --   --  1.9*  AST  --   --  19  ALT  --   --  13*  ALKPHOS  --   --  41  BILITOT  --   --  0.5   Estimated Creatinine Clearance: 54.7 mL/min (by C-G formula based on SCr of 0.8 mg/dL).    Recent Labs  04/22/16 2049 04/23/16 0035 04/23/16 0311  GLUCAP 162* 164* 146*    Medical History: Past Medical History:  Diagnosis Date  . DDD (degenerative disc disease), lumbar   . Diabetes mellitus   . Diverticulosis   . Endocervical polyp   . Endometrial polyp   . GERD (gastroesophageal reflux disease)   . Hiatal hernia   . Hx of adenomatous colonic polyps   . Hyperlipidemia   . Hypertension   . Hyponatremia     Medications:  Scheduled:  . ampicillin-sulbactam (UNASYN) IV  1.5 g Intravenous Q6H  . antiseptic oral rinse   7 mL Mouth Rinse q12n4p  . bisacodyl  10 mg Rectal Daily  . chlorhexidine  15 mL Mouth Rinse BID  . erythromycin   Both Eyes QHS  . fluconazole (DIFLUCAN) IV  200 mg Intravenous Q24H  . insulin aspart  0-20 Units Subcutaneous Q4H  . insulin aspart  14 Units Subcutaneous Once  . pantoprazole  40 mg Intravenous Q12H  . sodium chloride flush  10-40 mL Intracatheter Q12H  . sodium chloride flush  3 mL Intravenous Q12H   Infusions:  . Marland KitchenTPN (CLINIMIX-E) Adult 60 mL/hr at 04/22/16 1703   And  . fat emulsion 192 mL (04/22/16 1704)  . 0.45 % NaCl with KCl 20 mEq / L 30 mL/hr at 04/22/16 1647    Insulin Requirements: resistant SSI q4h and 25 units insulin in TPN bag; 10 units of SSI from 6P-6A (estimated 24 hrs usage ~20 units);   Current Nutrition: NPO  IVF: 0.45%NS with 20KCl at 30 ml/hr  Central access: PICC placed 8/2 TPN start date: 8/2  ASSESSMENT  HPI: 81 y.o.female,With history of essential hypertension, diabetes mellitus type 2, dyslipidemia, peptic ulcer disease, poor balance with multiple falls.  Admitted for dark stools which were heme positive with generalized weakness. EGD 7/31 diffuse esophagitis and a black necrotic appearing esophagus, pyloric mass causing gastric outlet obstruction. Biopsies pending. May need surgery. GI suggests TPN.  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  GI recom continuing NPO, IV PPI, and TPN  04/23/2016:   Glucose (goal <150): improved 131-164 with insulin increased to 25 units in TPN bag ; PTA glipizide & metformin on hold  Electrolytes:   Sodium slightly <goal  Magnesium slightly low at 1.6  Phos at lower end of goal range; K+ wnl  Corrected Ca WNL   Renal: SCr WNL  LFTs: Tbili slightly elevated on 7/30 but  improved to WNL   TGs: 126 on 8/3  Prealbumin: 7.2 on 8/3  NUTRITIONAL GOALS                                                                                             RD recs 8/4: 1250-1400 kcal, 70-85g protein per day  Clinimix E 5/15 at a goal rate of 18ml/hr + 20% fat emulsion at 67ml/hr to provide: 72g/day protein, 1406Kcal/day.  PLAN                                                                                                                         - potassium phosphate 10 mmol x1 - magnesium sulfate 1 gm IV x1  - At 1800 today:  Continue Clinimix E 5/15 at 60 ml/hr (goal rate) with 25 units of insulin in TPN bag  20% fat emulsion at 29ml/hr  TPN to contain standard multivitamins and trace elements.  Continue IVF at 18ml/hr - consider change to NS  Continue SSI resistant scale q4h.   TPN lab panels on Mondays & Thursdays.  Dia Sitter, PharmD, BCPS 04/23/2016 7:25 AM

## 2016-04-23 NOTE — Progress Notes (Signed)
PROGRESS NOTE                                                                                                                                                                                                             Patient Demographics:    [name] [name], is a 81 y.o. female, DOB - 1934-10-22, AP:[id number]  Admit date - 04/15/2016   Admitting Physician Thurnell Lose, MD  Outpatient Primary MD for the patient is [name], MD  LOS - 8  Brief Narrative    81 y.o. female, With history of essential hypertension, diabetes mellitus type 2, dyslipidemia, peptic ulcer disease, poor balance with multiple falls recently started using a walker still lives at home, has been experiencing some dark stools along with generalized weakness.  In the ER patient was found to have heme positive stool, she appeared slightly weak, dehydrated and pale.   Assessment  & Plan :   Acute upper GI bleed with melena and acute blood loss anemia. 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  - CT scan with no evidence of pneumotosis or pneumomediastinum related to the esophagus findings, although she is at risk for this - per GI team, acute esophageal necrosis carries a mortality rate between 15-30% - recommended treatment is supportive care with IVF, broad spectrum antibiotics, and IV PPI.  - continue Unasyn day #8 - Biopsies of pylorus do no show any malignant cells, esophageal  biopsies with acute inflammation, necrotic debri and fungal forms - TPN for nutrition continued  - diflucan added per GI team and will continue same regimen - repeat EGD done 8/6, awaiting path report for now  GERD - continue PPI   Generalized weakness with multiple falls.  - PT evaluation requested - plan to d/c to SNF  once medically cleared by Gi team   Essential hypertension - reasonable inpatient control   Dehydration with hyponatremia and hypokalemia - improved with IVF and supplementation  - repeat levels in AM and supplement as indicated   Leukocytosis - has been on Unasyn as noted under principal problem  - resolved   Mild hematemesis with right-sided aspiration pneumonitis on 04/16/2016.  - Currently not hypoxic - respiratory status stable   DM type II.  -  Continue on sliding scale will change it to every 6 hours since she is nothing by mouth. - A1C is 6.6  Family Communication  :  Pt at bedside   Code Status :  Full  Diet : NPO   Disposition Plan  :  Stay inpt  Consults  :  GI  Procedures  :  EGD x 3   DVT Prophylaxis  :    SCDs    Inpatient Medications  Scheduled Meds: . ampicillin-sulbactam (UNASYN) IV  1.5 g Intravenous Q6H  . antiseptic oral rinse  7 mL Mouth Rinse q12n4p  . bisacodyl  10 mg Rectal Daily  . chlorhexidine  15 mL Mouth Rinse BID  . erythromycin   Both Eyes QHS  . fluconazole (DIFLUCAN) IV  200 mg Intravenous Q24H  . insulin aspart  0-20 Units Subcutaneous Q4H  . insulin aspart  14 Units Subcutaneous Once  . pantoprazole  40 mg Intravenous Q12H  . sodium chloride flush  10-40 mL Intracatheter Q12H  . sodium chloride flush  3 mL Intravenous Q12H   Continuous Infusions: . Marland KitchenTPN (CLINIMIX-E) Adult 60 mL/hr at 04/23/16 1725   And  . fat emulsion 192 mL (04/23/16 1726)  . 0.45 % NaCl with KCl 20 mEq / L 30 mL/hr at 04/22/16 1647   PRN Meds:.albuterol, hydrALAZINE, [DISCONTINUED] ondansetron **OR** ondansetron (ZOFRAN) IV, sodium chloride flush  Antibiotics  :    Anti-infectives    Start     Dose/Rate Route Frequency Ordered Stop   04/20/16 1000  fluconazole (DIFLUCAN) IVPB 200 mg     200 mg 100 mL/hr over 60 Minutes Intravenous Every 24 hours 04/20/16 0929     04/16/16 0900  ampicillin-sulbactam (UNASYN) 1.5 g in sodium chloride 0.9 % 50 mL  IVPB     1.5 g 100 mL/hr over 30 Minutes Intravenous Every 6 hours 04/16/16 0806           Objective:   Vitals:   04/22/16 2102 04/23/16 0447 04/23/16 0500 04/23/16 1359  BP: 140/63 (!) 137/57  (!) 144/49  Pulse: 79 76  73  Resp: 18 18  18   Temp: 98.1 F (36.7 C) 98.5 F (36.9 C)  97.5 F (36.4 C)  TempSrc: Oral Oral  Axillary  SpO2: 97% 96%  99%  Weight:   74.3 kg (163 lb 12.8 oz)   Height:        Wt Readings from Last 3 Encounters:  04/23/16 74.3 kg (163 lb 12.8 oz)  01/20/16 78.5 kg (173 lb)  08/25/15 80.3 kg (177 lb)     Intake/Output Summary (Last 24 hours) at 04/23/16 1830 Last data filed at 04/23/16 1418  Gross per 24 hour  Intake          1434.47 ml  Output             1150 ml  Net           284.47 ml    Physical Exam Awake , Oriented X 2, NAD Supple Neck,No JVD, No cervical lymphadenopathy appriciated.  Symmetrical Chest wall movement, Good air movement bilaterally, few R.sided rales RRR,No Gallops,Rubs or new Murmurs Abd Soft, No tenderness, No organomegaly appriciated, No rebound - guarding or rigidity.   Data Review:    CBC  Recent Labs Lab 04/18/16 1800 04/19/16 0900 04/20/16 0609 04/21/16 0500 04/21/16 2022 04/23/16 0430  WBC 8.3 6.4 6.4 6.3  --  6.0  HGB 8.5* 7.9* 8.3* 7.3* 9.1* 9.1*  HCT 25.3* 23.3* 25.2*  22.0* 28.2* 27.2*  PLT 247 221 220 198  --  174  MCV 88.2 88.3 86.9 88.0  --  87.5  MCH 29.6 29.9 28.6 29.2  --  29.3  MCHC 33.6 33.9 32.9 33.2  --  33.5  RDW 17.0* 16.8* 16.4* 16.5*  --  16.2*  LYMPHSABS  --  1.0  --   --   --  1.4  MONOABS  --  0.5  --   --   --  0.6  EOSABS  --  0.1  --   --   --  0.1  BASOSABS  --  0.0  --   --   --  0.0    Chemistries   Recent Labs Lab 04/19/16 0900 04/20/16 0609 04/21/16 0500 04/22/16 0455 04/23/16 0430  NA 132* 136 133* 133* 133*  K 3.9 3.5 3.8 4.2 3.9  CL 107 108 106 104 104  CO2 22 23 24 25 26   GLUCOSE 295* 123* 203* 199* 131*  BUN 12 12 15 16 18   CREATININE 0.81 0.70  0.69 0.71 0.66  CALCIUM 8.0* 7.9* 7.4* 7.5* 7.6*  MG 1.4* 1.9 1.6* 1.8 1.6*  AST 18  --   --   --  19  ALT 15  --   --   --  13*  ALKPHOS 38  --   --   --  41  BILITOT 0.4  --   --   --  0.5    Lab Results  Component Value Date   HGBA1C 6.6 (H) 04/15/2016   No results found for: BNP  Micro Results Recent Results (from the past 240 hour(s))  MRSA PCR Screening     Status: None   Collection Time: 04/17/16  6:27 PM  Result Value Ref Range Status   MRSA by PCR NEGATIVE NEGATIVE Final    Radiology Reports Dg Chest 2 View  Result Date: 03/29/2016 CLINICAL DATA:  Hypotension and weakness for 1 week. History of diabetes and high blood pressure. EXAM: CHEST  2 VIEW COMPARISON:  Seventeen 17 FINDINGS: Normal heart size and pulmonary vascularity. No focal airspace disease or consolidation in the lungs. No blunting of costophrenic angles. No pneumothorax. Mediastinal contours appear intact. Degenerative changes in the spine and shoulders. Calcification of the aorta. IMPRESSION: No active cardiopulmonary disease. Electronically Signed   By: Lucienne Capers M.D.   On: 03/29/2016 06:15   Ct Head Wo Contrast  Result Date: 04/15/2016 CLINICAL DATA:  Unwitnessed fall. Fell wall trying again out of bed. Trauma to head without loss of consciousness. Initial encounter. EXAM: CT HEAD WITHOUT CONTRAST CT CERVICAL SPINE WITHOUT CONTRAST TECHNIQUE: Multidetector CT imaging of the head and cervical spine was performed following the standard protocol without intravenous contrast. Multiplanar CT image reconstructions of the cervical spine were also generated. COMPARISON:  CT head without contrast 03/29/2016. CT of the head and face 12/24/2015. FINDINGS: CT HEAD FINDINGS Moderate atrophy and periventricular white matter hypoattenuation is stable. This is slightly advanced for age. The ventricles proportionate to the degree of atrophy. A remote infarct of the right cerebellum is stable. No acute infarct,  hemorrhage, or mass lesion is present. No significant extraaxial fluid collection is present. Minimal soft tissue swelling is present in the left occipital sub scalp without an underlying fracture. The calvarium is intact. A remote right inferior orbital floor fracture has healed. The paranasal sinuses and mastoid air cells are clear. CT CERVICAL SPINE FINDINGS The cervical spine is imaged from the skullbase through  T2-3. Asymmetric degenerative changes are present at the left atlantooccipital joint. Degenerative changes are present at C1-2. There is chronic loss of disc height from C3 through C7. Vertebral body heights and alignment are maintained. Facet hypertrophy is worse on the right. Soft tissues the neck demonstrate thyroid atrophy. Atherosclerotic calcifications are present at the carotid bifurcations bilaterally without significant stenoses. A large right pleural effusion is present. The lung apices are otherwise clear. IMPRESSION: 1. Stable moderate atrophy and white matter disease. No acute intracranial abnormality. 2. Minimal soft tissue swelling in the left occipital scalp may be related to the acute trauma. There is no underlying fracture. The 3. Remote right inferior orbital floor fracture has healed. 4. Multilevel degenerative changes in the cervical spine. 5. New moderate-sized right pleural effusion. Recommend two-view chest x-ray for further evaluation. Electronically Signed   By: San Morelle M.D.   On: 04/15/2016 14:40  Ct Head Wo Contrast  Result Date: 03/29/2016 CLINICAL DATA:  Hypoglycemic and hypotensive for about a week. Fell 6 days ago. Weakness since the fall. Confusion, double vision, and slurred speech for 1 day. Unsteady gait. EXAM: CT HEAD WITHOUT CONTRAST TECHNIQUE: Contiguous axial images were obtained from the base of the skull through the vertex without intravenous contrast. COMPARISON:  12/24/2015 FINDINGS: Mild cerebral atrophy. Ventricular dilatation consistent with  central atrophy. Low-attenuation changes in the deep white matter consistent with small vessel ischemia. No mass effect or midline shift. No abnormal extra-axial fluid collections. Gray-white matter junctions are distinct. Basal cisterns are not effaced. No evidence of acute intracranial hemorrhage. No depressed skull fractures. Visualized paranasal sinuses and mastoid air cells are not opacified. IMPRESSION: No acute intracranial abnormalities. Chronic atrophy and small vessel ischemic changes. Electronically Signed   By: Lucienne Capers M.D.   On: 03/29/2016 04:26   Ct Chest W Contrast  Result Date: 04/17/2016 CLINICAL DATA:  Black esophagus on EGD today.  Rule out necrosis. EXAM: CT CHEST, ABDOMEN, AND PELVIS WITH CONTRAST TECHNIQUE: Multidetector CT imaging of the chest, abdomen and pelvis was performed following the standard protocol during bolus administration of intravenous contrast. CONTRAST:  131mL ISOVUE-300 IOPAMIDOL (ISOVUE-300) INJECTION 61% COMPARISON:  Chest radiographs yesterday. No prior chest CT. CT abdomen/pelvis 08/04/2012 FINDINGS: CT CHEST FINDINGS Cardiovascular: Tortuosity of the thoracic aorta without aneurysm. There is moderate atherosclerosis. Conventional branching pattern from the aortic arch. No aortic dissection. Scattered coronary artery calcifications. Heart is normal in size. Mediastinum/Nodes: There is diffuse esophageal wall thickening from the thoracic inlet through the diaphragmatic hiatus. Intraluminal esophageal fluid. No evidence of pneumatosis. No pneumomediastinum. Small amount of paraesophageal fluid distally may be contiguous with right pleural effusion. Prominent right upper paratracheal node measures 10 mm short axis. Small lower paratracheal nodes not enlarged by size criteria. No evidence of hilar adenopathy. No axillary adenopathy. No pericardial effusion. Lungs/Pleura: Moderate bilateral pleural effusions, right greater than left. There is adjacent compressive  atelectasis in both lower lobes. Mild patchy opacities in the anterior right upper lobe, left upper lobe to a lesser extent and superior segment left lower lobe. There is minimal smooth septal thickening. Trachea and mainstem bronchi are patent. Musculoskeletal: There are no acute or suspicious osseous abnormalities. CT ABDOMEN PELVIS FINDINGS Hepatobiliary: No focal hepatic lesion. Streak artifact from arms down positioning. Question of nodular hepatic contours. Postcholecystectomy. No biliary dilatation. Pancreas: Atrophic parenchyma. No ductal dilatation or inflammation. Spleen: Unremarkable. Adrenals/Urinary Tract: No adrenal nodule. Symmetric renal enhancement. No hydronephrosis. Motion artifact through the lower left kidney. Contour deformity is likely secondary  to lobular contours and motion, less likely focal renal lesion. Urinary bladder is physiologically distended. Stomach/Bowel: Diffuse wall thickening and edematous appearance. Small volume of intraluminal gastric fluid. No pneumatosis. Small bowel is decompressed. No definite small bowel thickening. Extensive colonic diverticulosis of the descending and sigmoid colon without acute diverticulitis. Scattered colonic diverticular throughout the remainder the colon. Appendix tentatively identified and normal. There is mild wall thickening at the rectosigmoid junction with adjacent perirectal fat stranding. Vascular/Lymphatic: Abdominal aortic atherosclerosis. No aneurysm. Circumaortic left renal vein. No retroperitoneal adenopathy. Reproductive: Uterus is atrophic, normal for age.  No adnexal mass. Other: Small fat containing upper ventral abdominal wall hernia. Small amount of ascites adjacent to the liver in the spleen. There is no free air. No intra-abdominal abscess. There is whole body wall edema. Musculoskeletal: Degenerative change throughout spine. There are no acute or suspicious osseous abnormalities. IMPRESSION: 1. Esophagus is diffusely  edematous with diffuse wall thickening and intraluminal fluid. No pneumatosis or pneumomediastinum. No CT findings to suggest necrosis. Similar edematous appearance of the stomach. 2. Moderate bilateral pleural effusions with adjacent compressive atelectasis. Minimal smooth septal thickening may reflect pulmonary edema. 3. Contour deformity of the lower left kidney likely related to lobular contours and motion, difficult to exclude exophytic lesion. Nonemergent renal ultrasound could be considered when patient is able. 4. Rather extensive colonic diverticulosis, no diverticulitis. Rectus sigmoid colonic wall thickening with adjacent edema, suggesting chronic constipation. 5. Question of nodular hepatic contours raising concern for cirrhosis. 6. Whole body wall edema.  Minimal abdominal ascites. 7. Thoracoabdominal aortic atherosclerosis. Coronary artery calcifications. Electronically Signed   By: Jeb Levering M.D.   On: 04/17/2016 22:00   Ct Cervical Spine Wo Contrast  Result Date: 04/15/2016 CLINICAL DATA:  Unwitnessed fall. Fell wall trying again out of bed. Trauma to head without loss of consciousness. Initial encounter. EXAM: CT HEAD WITHOUT CONTRAST CT CERVICAL SPINE WITHOUT CONTRAST TECHNIQUE: Multidetector CT imaging of the head and cervical spine was performed following the standard protocol without intravenous contrast. Multiplanar CT image reconstructions of the cervical spine were also generated. COMPARISON:  CT head without contrast 03/29/2016. CT of the head and face 12/24/2015. FINDINGS: CT HEAD FINDINGS Moderate atrophy and periventricular white matter hypoattenuation is stable. This is slightly advanced for age. The ventricles proportionate to the degree of atrophy. A remote infarct of the right cerebellum is stable. No acute infarct, hemorrhage, or mass lesion is present. No significant extraaxial fluid collection is present. Minimal soft tissue swelling is present in the left occipital sub  scalp without an underlying fracture. The calvarium is intact. A remote right inferior orbital floor fracture has healed. The paranasal sinuses and mastoid air cells are clear. CT CERVICAL SPINE FINDINGS The cervical spine is imaged from the skullbase through T2-3. Asymmetric degenerative changes are present at the left atlantooccipital joint. Degenerative changes are present at C1-2. There is chronic loss of disc height from C3 through C7. Vertebral body heights and alignment are maintained. Facet hypertrophy is worse on the right. Soft tissues the neck demonstrate thyroid atrophy. Atherosclerotic calcifications are present at the carotid bifurcations bilaterally without significant stenoses. A large right pleural effusion is present. The lung apices are otherwise clear. IMPRESSION: 1. Stable moderate atrophy and white matter disease. No acute intracranial abnormality. 2. Minimal soft tissue swelling in the left occipital scalp may be related to the acute trauma. There is no underlying fracture. The 3. Remote right inferior orbital floor fracture has healed. 4. Multilevel degenerative changes in the cervical  spine. 5. New moderate-sized right pleural effusion. Recommend two-view chest x-ray for further evaluation. Electronically Signed   By: San Morelle M.D.   On: 04/15/2016 14:40  Ct Abdomen Pelvis W Contrast  Result Date: 04/17/2016 CLINICAL DATA:  Black esophagus on EGD today.  Rule out necrosis. EXAM: CT CHEST, ABDOMEN, AND PELVIS WITH CONTRAST TECHNIQUE: Multidetector CT imaging of the chest, abdomen and pelvis was performed following the standard protocol during bolus administration of intravenous contrast. CONTRAST:  142mL ISOVUE-300 IOPAMIDOL (ISOVUE-300) INJECTION 61% COMPARISON:  Chest radiographs yesterday. No prior chest CT. CT abdomen/pelvis 08/04/2012 FINDINGS: CT CHEST FINDINGS Cardiovascular: Tortuosity of the thoracic aorta without aneurysm. There is moderate atherosclerosis.  Conventional branching pattern from the aortic arch. No aortic dissection. Scattered coronary artery calcifications. Heart is normal in size. Mediastinum/Nodes: There is diffuse esophageal wall thickening from the thoracic inlet through the diaphragmatic hiatus. Intraluminal esophageal fluid. No evidence of pneumatosis. No pneumomediastinum. Small amount of paraesophageal fluid distally may be contiguous with right pleural effusion. Prominent right upper paratracheal node measures 10 mm short axis. Small lower paratracheal nodes not enlarged by size criteria. No evidence of hilar adenopathy. No axillary adenopathy. No pericardial effusion. Lungs/Pleura: Moderate bilateral pleural effusions, right greater than left. There is adjacent compressive atelectasis in both lower lobes. Mild patchy opacities in the anterior right upper lobe, left upper lobe to a lesser extent and superior segment left lower lobe. There is minimal smooth septal thickening. Trachea and mainstem bronchi are patent. Musculoskeletal: There are no acute or suspicious osseous abnormalities. CT ABDOMEN PELVIS FINDINGS Hepatobiliary: No focal hepatic lesion. Streak artifact from arms down positioning. Question of nodular hepatic contours. Postcholecystectomy. No biliary dilatation. Pancreas: Atrophic parenchyma. No ductal dilatation or inflammation. Spleen: Unremarkable. Adrenals/Urinary Tract: No adrenal nodule. Symmetric renal enhancement. No hydronephrosis. Motion artifact through the lower left kidney. Contour deformity is likely secondary to lobular contours and motion, less likely focal renal lesion. Urinary bladder is physiologically distended. Stomach/Bowel: Diffuse wall thickening and edematous appearance. Small volume of intraluminal gastric fluid. No pneumatosis. Small bowel is decompressed. No definite small bowel thickening. Extensive colonic diverticulosis of the descending and sigmoid colon without acute diverticulitis. Scattered  colonic diverticular throughout the remainder the colon. Appendix tentatively identified and normal. There is mild wall thickening at the rectosigmoid junction with adjacent perirectal fat stranding. Vascular/Lymphatic: Abdominal aortic atherosclerosis. No aneurysm. Circumaortic left renal vein. No retroperitoneal adenopathy. Reproductive: Uterus is atrophic, normal for age.  No adnexal mass. Other: Small fat containing upper ventral abdominal wall hernia. Small amount of ascites adjacent to the liver in the spleen. There is no free air. No intra-abdominal abscess. There is whole body wall edema. Musculoskeletal: Degenerative change throughout spine. There are no acute or suspicious osseous abnormalities. IMPRESSION: 1. Esophagus is diffusely edematous with diffuse wall thickening and intraluminal fluid. No pneumatosis or pneumomediastinum. No CT findings to suggest necrosis. Similar edematous appearance of the stomach. 2. Moderate bilateral pleural effusions with adjacent compressive atelectasis. Minimal smooth septal thickening may reflect pulmonary edema. 3. Contour deformity of the lower left kidney likely related to lobular contours and motion, difficult to exclude exophytic lesion. Nonemergent renal ultrasound could be considered when patient is able. 4. Rather extensive colonic diverticulosis, no diverticulitis. Rectus sigmoid colonic wall thickening with adjacent edema, suggesting chronic constipation. 5. Question of nodular hepatic contours raising concern for cirrhosis. 6. Whole body wall edema.  Minimal abdominal ascites. 7. Thoracoabdominal aortic atherosclerosis. Coronary artery calcifications. Electronically Signed   By: Fonnie Birkenhead.D.  On: 04/17/2016 22:00   Dg Chest Port 1 View  Result Date: 04/16/2016 CLINICAL DATA:  Increased cough and congestion this morning. EXAM: PORTABLE CHEST 1 VIEW COMPARISON:  Chest x-rays dated 04/15/2016 and 03/29/2016. FINDINGS: Today's exam is slightly  hypoinspiratory with crowding of the perihilar bronchovascular markings. New patchy airspace opacities are seen at the right lung base. Left lung remains clear. Cardiomediastinal silhouette is stable. Atherosclerotic changes noted at the aortic arch. Osseous structures about the chest are unremarkable. IMPRESSION: Low lung volumes. New patchy airspace opacities at the right lung base, atelectasis versus early developing pneumonia. Aortic atherosclerosis. Electronically Signed   By: Franki Cabot M.D.   On: 04/16/2016 08:16  Dg Chest Port 1 View  Result Date: 04/15/2016 CLINICAL DATA:  Unwitnessed fall today.  Pleural effusion. EXAM: PORTABLE CHEST 1 VIEW COMPARISON:  03/29/2016 FINDINGS: Heart is borderline in size. Mild vascular congestion. Bibasilar atelectasis. No visible effusions or pneumothorax. No acute bony abnormality. IMPRESSION: Borderline heart size with vascular congestion. Bibasilar atelectasis. Electronically Signed   By: Rolm Baptise M.D.   On: 04/15/2016 15:19  Dg Chest Portable 1 View  Result Date: 03/26/2016 CLINICAL DATA:  Weakness, hypotension EXAM: PORTABLE CHEST 1 VIEW COMPARISON:  06/03/2008 FINDINGS: Cardiomediastinal silhouette is stable. No acute infiltrate or pleural effusion. No pulmonary edema. Mild degenerative changes thoracic spine. Degenerative changes bilateral shoulders. IMPRESSION: No active disease. Electronically Signed   By: Lahoma Crocker M.D.   On: 03/26/2016 15:22   Dg Abd Portable 1v  Result Date: 04/16/2016 CLINICAL DATA:  Melena. Ongoing heartburn. Pelvic pain since this morning. EXAM: PORTABLE ABDOMEN - 1 VIEW COMPARISON:  None FINDINGS: Moderate stool burden throughout the colon. Nonobstructive bowel gas pattern. No free air, organomegaly or suspicious calcification. Degenerative changes in the lumbar spine and hips. No acute bony abnormality. IMPRESSION: Moderate stool burden.  No acute findings. Electronically Signed   By: Rolm Baptise M.D.   On: 04/16/2016  09:30   Time Spent in minutes  30  MAGICK-Tahnee Cifuentes M.D on 04/23/2016 at 6:30 PM  Between 7am to 7pm - Pager - [phone]  After 7pm go to [email or web address] - password [facility]  [facility] -  Office  [phone]

## 2016-04-24 LAB — GLUCOSE, CAPILLARY
GLUCOSE-CAPILLARY: 139 mg/dL — AB (ref 65–99)
GLUCOSE-CAPILLARY: 168 mg/dL — AB (ref 65–99)
GLUCOSE-CAPILLARY: 165 mg/dL — AB (ref 65–99)
Glucose-Capillary: 149 mg/dL — ABNORMAL HIGH (ref 65–99)
Glucose-Capillary: 173 mg/dL — ABNORMAL HIGH (ref 65–99)
Glucose-Capillary: 188 mg/dL — ABNORMAL HIGH (ref 65–99)

## 2016-04-24 LAB — MAGNESIUM: MAGNESIUM: 1.7 mg/dL (ref 1.7–2.4)

## 2016-04-24 MED ORDER — MAGNESIUM SULFATE IN D5W 1-5 GM/100ML-% IV SOLN
1.0000 g | Freq: Once | INTRAVENOUS | Status: AC
  Administered 2016-04-24: 1 g via INTRAVENOUS
  Filled 2016-04-24: qty 100

## 2016-04-24 MED ORDER — FAT EMULSION 20 % IV EMUL
192.0000 mL | INTRAVENOUS | Status: AC
Start: 2016-04-24 — End: 2016-04-25
  Administered 2016-04-24: 192 mL via INTRAVENOUS
  Filled 2016-04-24: qty 200

## 2016-04-24 MED ORDER — TRACE MINERALS CR-CU-MN-SE-ZN 10-1000-500-60 MCG/ML IV SOLN
INTRAVENOUS | Status: AC
  Administered 2016-04-24: 17:00:00 via INTRAVENOUS
  Filled 2016-04-24: qty 1440

## 2016-04-24 NOTE — Progress Notes (Signed)
     [facility] Progress Note  Subjective:  No new complaints.  Says that she actually got some rest last night.  Feeling a little better.  Objective:  Vital signs in last 24 hours: Temp:  [97.5 F (36.4 C)-98 F (36.7 C)] 97.5 F (36.4 C) (08/08 [id number]) Pulse Rate:  [73-81] 81 (08/08 0638) Resp:  [18] 18 (08/08 0638) BP: (137-145)/(49-59) 137/59 (08/08 0638) SpO2:  [99 %] 99 % (08/07 1359) Weight:  [165 lb (74.8 kg)] 165 lb (74.8 kg) (08/08 0500) Last BM Date: 04/21/16 General:  Alert, Well-developed, in NAD Heart:  Regular rate and rhythm; no murmurs Pulm:  CTAB.  No W/R/R. Abdomen:  Soft, non-distended.  BS present.  Mild epigastric TTP. Extremities:  Without edema. Neurologic:  Alert and oriented x 4;  grossly normal neurologically. Psych:  Alert and cooperative. Normal mood and affect.  Intake/Output from previous day: 08/07 0701 - 08/08 0700 In: 1066.3 [I.V.:766.3; IV Piggyback:300] Out: 950 [Urine:950]  Lab Results:  Recent Labs  04/21/16 2022 04/23/16 0430  WBC  --  6.0  HGB 9.1* 9.1*  HCT 28.2* 27.2*  PLT  --  174   BMET  Recent Labs  04/22/16 0455 04/23/16 0430  NA 133* 133*  K 4.2 3.9  CL 104 104  CO2 25 26  GLUCOSE 199* 131*  BUN 16 18  CREATININE 0.71 0.66  CALCIUM 7.5* 7.6*   LFT  Recent Labs  04/23/16 0430  PROT 4.6*  ALBUMIN 1.9*  AST 19  ALT 13*  ALKPHOS 41  BILITOT 0.5   Assessment / Plan: #1  81 yo female with partial GOO secondary to pyloric lesion and severe esophagitis with necrosis/black esophagus. Bx of pylorus did not prove malignancy; even repeat biopsy showed hyperplastic polyp.  Esophageal bx with ulceration, nectrotic debri, and fungal forms.  Repeat EGD 8/6 showed mild improvement of the esophagus (? Stricture development).  On TNA, Diflucan, BID PPI-continue. #2 anemia- hgb stable this AM. #3 Right pneumonia secondary to aspiration - on Unasyn #4 AODM #5 AMS- resolved   *Per Dr. Carlean Purl:  Will need  to consider if EUS is an option but needs to wait for esophagus to heal more I would think.  Surgery may be needed but would not pursue that right now - without more certainty of dx and overall improvement.   LOS: 9 days   ZEHR, JESSICA D.  04/24/2016, 12:03 PM  Pager number [phone]   Agree with Ms. Alphia Kava management.  Gatha Mayer, MD, [facility]

## 2016-04-24 NOTE — Care Management Important Message (Signed)
Important Message  Patient Details  Name: TAKENYA TENBUSCH MRN: [id number] Date of Birth: 04-Nov-1934   Medicare Important Message Given:  Yes    Camillo Flaming 04/24/2016, 12:14 [place] Message  Patient Details  Name: LYNNA CARLSSON MRN: [id number] Date of Birth: [birth year]   Medicare Important Message Given:  Yes    Camillo Flaming 04/24/2016, 12:14 PM

## 2016-04-24 NOTE — Progress Notes (Signed)
PROGRESS NOTE                                                                                                                                                                                                             Patient Demographics:    [name] [name], is a 81 y.o. female, DOB - 1935-03-31, AP:[id number]  Admit date - 04/15/2016   Admitting Physician Thurnell Lose, MD  Outpatient Primary MD for the patient is [name], MD  LOS - 9  Brief Narrative    81 y.o. female, With history of essential hypertension, diabetes mellitus type 2, dyslipidemia, peptic ulcer disease, poor balance with multiple falls recently started using a walker still lives at home, has been experiencing some dark stools along with generalized weakness.  In the ER patient was found to have heme positive stool, she appeared slightly weak, dehydrated and pale.   Assessment  & Plan :   Acute upper GI bleed with melena and acute blood loss anemia. 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  - CT scan with no evidence of pneumotosis or pneumomediastinum related to the esophagus findings, although she is at risk for this - per GI team, acute esophageal necrosis carries a mortality rate between 15-30% - recommended treatment is supportive care with IVF, broad spectrum antibiotics, and IV PPI.  - continue Unasyn day #9 - Biopsies of pylorus do not show any malignant cells, esophageal  biopsies with acute inflammation, necrotic debri and fungal forms - TPN for nutrition continued  - diflucan added per GI team and will continue same regimen - repeat EGD done 8/6, awaiting path report for now - per GI team will need to consider if EUS is an option but needs to wait for esophagus to heal more, surgery may be  needed but would not pursue that right now - without more certainty of dx and overall improvement.  GERD - continue PPI   Generalized weakness with multiple falls.  - PT evaluation requested - plan to d/c to SNF once medically cleared by Gi team - daughter in agreement   Essential hypertension - reasonable inpatient control   Dehydration with hyponatremia and hypokalemia - improved with IVF and supplementation  - repeat levels in  AM and supplement as indicated   Leukocytosis - has been on Unasyn as noted under principal problem  - resolved   Mild hematemesis with right-sided aspiration pneumonitis on 04/16/2016.  - Currently not hypoxic - respiratory status stable   DM type II.  - Continue on sliding scale will change it to every 6 hours since she is nothing by mouth. - A1C is 6.6  Family Communication  :  Pt at bedside, daughter over the phone   Code Status :  Full  Diet : NPO   Disposition Plan  :  Stay inpt, no plan to d/c until GI team clears   Consults  :  GI  Procedures  :  EGD x 3   DVT Prophylaxis  :    SCDs    Inpatient Medications  Scheduled Meds: . ampicillin-sulbactam (UNASYN) IV  1.5 g Intravenous Q6H  . antiseptic oral rinse  7 mL Mouth Rinse q12n4p  . bisacodyl  10 mg Rectal Daily  . chlorhexidine  15 mL Mouth Rinse BID  . erythromycin   Both Eyes QHS  . fluconazole (DIFLUCAN) IV  200 mg Intravenous Q24H  . insulin aspart  0-20 Units Subcutaneous Q4H  . insulin aspart  14 Units Subcutaneous Once  . pantoprazole  40 mg Intravenous Q12H  . sodium chloride flush  10-40 mL Intracatheter Q12H  . sodium chloride flush  3 mL Intravenous Q12H   Continuous Infusions: . Marland KitchenTPN (CLINIMIX-E) Adult 60 mL/hr at 04/23/16 1725   And  . fat emulsion 192 mL (04/23/16 1726)  . 0.45 % NaCl with KCl 20 mEq / L 30 mL/hr at 04/22/16 1647   PRN Meds:.albuterol, hydrALAZINE, morphine injection, [DISCONTINUED] ondansetron **OR** ondansetron (ZOFRAN) IV, sodium  chloride flush  Antibiotics  :    Anti-infectives    Start     Dose/Rate Route Frequency Ordered Stop   04/20/16 1000  fluconazole (DIFLUCAN) IVPB 200 mg     200 mg 100 mL/hr over 60 Minutes Intravenous Every 24 hours 04/20/16 0929     04/16/16 0900  ampicillin-sulbactam (UNASYN) 1.5 g in sodium chloride 0.9 % 50 mL IVPB     1.5 g 100 mL/hr over 30 Minutes Intravenous Every 6 hours 04/16/16 0806           Objective:   Vitals:   04/23/16 1359 04/23/16 2100 04/24/16 0500 04/24/16 0638  BP: (!) 144/49 (!) 145/52  (!) 137/59  Pulse: 73 75  81  Resp: 18 18  18   Temp: 97.5 F (36.4 C) 98 F (36.7 C)  97.5 F (36.4 C)  TempSrc: Axillary Oral  Oral  SpO2: 99%     Weight:   74.8 kg (165 lb)   Height:        Wt Readings from Last 3 Encounters:  04/24/16 74.8 kg (165 lb)  01/20/16 78.5 kg (173 lb)  08/25/15 80.3 kg (177 lb)     Intake/Output Summary (Last 24 hours) at 04/24/16 0700 Last data filed at 04/24/16 0600  Gross per 24 hour  Intake          1066.33 ml  Output              950 ml  Net           116.33 ml    Physical Exam Awake , Oriented X 2, NAD Supple Neck,No JVD, No cervical lymphadenopathy appriciated.  Symmetrical Chest wall movement, Good air movement bilaterally, few R.sided rales RRR,No Gallops,Rubs or new Murmurs  Abd Soft, No tenderness, No organomegaly appriciated, No rebound - guarding or rigidity.   Data Review:    CBC  Recent Labs Lab 04/18/16 1800 04/19/16 0900 04/20/16 0609 04/21/16 0500 04/21/16 2022 04/23/16 0430  WBC 8.3 6.4 6.4 6.3  --  6.0  HGB 8.5* 7.9* 8.3* 7.3* 9.1* 9.1*  HCT 25.3* 23.3* 25.2* 22.0* 28.2* 27.2*  PLT 247 221 220 198  --  174  MCV 88.2 88.3 86.9 88.0  --  87.5  MCH 29.6 29.9 28.6 29.2  --  29.3  MCHC 33.6 33.9 32.9 33.2  --  33.5  RDW 17.0* 16.8* 16.4* 16.5*  --  16.2*  LYMPHSABS  --  1.0  --   --   --  1.4  MONOABS  --  0.5  --   --   --  0.6  EOSABS  --  0.1  --   --   --  0.1  BASOSABS  --  0.0  --    --   --  0.0    Chemistries   Recent Labs Lab 04/19/16 0900 04/20/16 0609 04/21/16 0500 04/22/16 0455 04/23/16 0430 04/24/16 0335  NA 132* 136 133* 133* 133*  --   K 3.9 3.5 3.8 4.2 3.9  --   CL 107 108 106 104 104  --   CO2 22 23 24 25 26   --   GLUCOSE 295* 123* 203* 199* 131*  --   BUN 12 12 15 16 18   --   CREATININE 0.81 0.70 0.69 0.71 0.66  --   CALCIUM 8.0* 7.9* 7.4* 7.5* 7.6*  --   MG 1.4* 1.9 1.6* 1.8 1.6* 1.7  AST 18  --   --   --  19  --   ALT 15  --   --   --  13*  --   ALKPHOS 38  --   --   --  41  --   BILITOT 0.4  --   --   --  0.5  --     Lab Results  Component Value Date   HGBA1C 6.6 (H) 04/15/2016   No results found for: BNP  Micro Results Recent Results (from the past 240 hour(s))  MRSA PCR Screening     Status: None   Collection Time: 04/17/16  6:27 PM  Result Value Ref Range Status   MRSA by PCR NEGATIVE NEGATIVE Final    Radiology Reports Dg Chest 2 View  Result Date: 03/29/2016 CLINICAL DATA:  Hypotension and weakness for 1 week. History of diabetes and high blood pressure. EXAM: CHEST  2 VIEW COMPARISON:  Seventeen 17 FINDINGS: Normal heart size and pulmonary vascularity. No focal airspace disease or consolidation in the lungs. No blunting of costophrenic angles. No pneumothorax. Mediastinal contours appear intact. Degenerative changes in the spine and shoulders. Calcification of the aorta. IMPRESSION: No active cardiopulmonary disease. Electronically Signed   By: Lucienne Capers M.D.   On: 03/29/2016 06:15   Ct Head Wo Contrast  Result Date: 04/15/2016 CLINICAL DATA:  Unwitnessed fall. Fell wall trying again out of bed. Trauma to head without loss of consciousness. Initial encounter. EXAM: CT HEAD WITHOUT CONTRAST CT CERVICAL SPINE WITHOUT CONTRAST TECHNIQUE: Multidetector CT imaging of the head and cervical spine was performed following the standard protocol without intravenous contrast. Multiplanar CT image reconstructions of the cervical  spine were also generated. COMPARISON:  CT head without contrast 03/29/2016. CT of the head and face 12/24/2015. FINDINGS: CT HEAD FINDINGS  Moderate atrophy and periventricular white matter hypoattenuation is stable. This is slightly advanced for age. The ventricles proportionate to the degree of atrophy. A remote infarct of the right cerebellum is stable. No acute infarct, hemorrhage, or mass lesion is present. No significant extraaxial fluid collection is present. Minimal soft tissue swelling is present in the left occipital sub scalp without an underlying fracture. The calvarium is intact. A remote right inferior orbital floor fracture has healed. The paranasal sinuses and mastoid air cells are clear. CT CERVICAL SPINE FINDINGS The cervical spine is imaged from the skullbase through T2-3. Asymmetric degenerative changes are present at the left atlantooccipital joint. Degenerative changes are present at C1-2. There is chronic loss of disc height from C3 through C7. Vertebral body heights and alignment are maintained. Facet hypertrophy is worse on the right. Soft tissues the neck demonstrate thyroid atrophy. Atherosclerotic calcifications are present at the carotid bifurcations bilaterally without significant stenoses. A large right pleural effusion is present. The lung apices are otherwise clear. IMPRESSION: 1. Stable moderate atrophy and white matter disease. No acute intracranial abnormality. 2. Minimal soft tissue swelling in the left occipital scalp may be related to the acute trauma. There is no underlying fracture. The 3. Remote right inferior orbital floor fracture has healed. 4. Multilevel degenerative changes in the cervical spine. 5. New moderate-sized right pleural effusion. Recommend two-view chest x-ray for further evaluation. Electronically Signed   By: San Morelle M.D.   On: 04/15/2016 14:40  Ct Head Wo Contrast  Result Date: 03/29/2016 CLINICAL DATA:  Hypoglycemic and hypotensive for  about a week. Fell 6 days ago. Weakness since the fall. Confusion, double vision, and slurred speech for 1 day. Unsteady gait. EXAM: CT HEAD WITHOUT CONTRAST TECHNIQUE: Contiguous axial images were obtained from the base of the skull through the vertex without intravenous contrast. COMPARISON:  12/24/2015 FINDINGS: Mild cerebral atrophy. Ventricular dilatation consistent with central atrophy. Low-attenuation changes in the deep white matter consistent with small vessel ischemia. No mass effect or midline shift. No abnormal extra-axial fluid collections. Gray-white matter junctions are distinct. Basal cisterns are not effaced. No evidence of acute intracranial hemorrhage. No depressed skull fractures. Visualized paranasal sinuses and mastoid air cells are not opacified. IMPRESSION: No acute intracranial abnormalities. Chronic atrophy and small vessel ischemic changes. Electronically Signed   By: Lucienne Capers M.D.   On: 03/29/2016 04:26   Ct Chest W Contrast  Result Date: 04/17/2016 CLINICAL DATA:  Black esophagus on EGD today.  Rule out necrosis. EXAM: CT CHEST, ABDOMEN, AND PELVIS WITH CONTRAST TECHNIQUE: Multidetector CT imaging of the chest, abdomen and pelvis was performed following the standard protocol during bolus administration of intravenous contrast. CONTRAST:  137mL ISOVUE-300 IOPAMIDOL (ISOVUE-300) INJECTION 61% COMPARISON:  Chest radiographs yesterday. No prior chest CT. CT abdomen/pelvis 08/04/2012 FINDINGS: CT CHEST FINDINGS Cardiovascular: Tortuosity of the thoracic aorta without aneurysm. There is moderate atherosclerosis. Conventional branching pattern from the aortic arch. No aortic dissection. Scattered coronary artery calcifications. Heart is normal in size. Mediastinum/Nodes: There is diffuse esophageal wall thickening from the thoracic inlet through the diaphragmatic hiatus. Intraluminal esophageal fluid. No evidence of pneumatosis. No pneumomediastinum. Small amount of paraesophageal  fluid distally may be contiguous with right pleural effusion. Prominent right upper paratracheal node measures 10 mm short axis. Small lower paratracheal nodes not enlarged by size criteria. No evidence of hilar adenopathy. No axillary adenopathy. No pericardial effusion. Lungs/Pleura: Moderate bilateral pleural effusions, right greater than left. There is adjacent compressive atelectasis in both lower lobes.  Mild patchy opacities in the anterior right upper lobe, left upper lobe to a lesser extent and superior segment left lower lobe. There is minimal smooth septal thickening. Trachea and mainstem bronchi are patent. Musculoskeletal: There are no acute or suspicious osseous abnormalities. CT ABDOMEN PELVIS FINDINGS Hepatobiliary: No focal hepatic lesion. Streak artifact from arms down positioning. Question of nodular hepatic contours. Postcholecystectomy. No biliary dilatation. Pancreas: Atrophic parenchyma. No ductal dilatation or inflammation. Spleen: Unremarkable. Adrenals/Urinary Tract: No adrenal nodule. Symmetric renal enhancement. No hydronephrosis. Motion artifact through the lower left kidney. Contour deformity is likely secondary to lobular contours and motion, less likely focal renal lesion. Urinary bladder is physiologically distended. Stomach/Bowel: Diffuse wall thickening and edematous appearance. Small volume of intraluminal gastric fluid. No pneumatosis. Small bowel is decompressed. No definite small bowel thickening. Extensive colonic diverticulosis of the descending and sigmoid colon without acute diverticulitis. Scattered colonic diverticular throughout the remainder the colon. Appendix tentatively identified and normal. There is mild wall thickening at the rectosigmoid junction with adjacent perirectal fat stranding. Vascular/Lymphatic: Abdominal aortic atherosclerosis. No aneurysm. Circumaortic left renal vein. No retroperitoneal adenopathy. Reproductive: Uterus is atrophic, normal for age.  No  adnexal mass. Other: Small fat containing upper ventral abdominal wall hernia. Small amount of ascites adjacent to the liver in the spleen. There is no free air. No intra-abdominal abscess. There is whole body wall edema. Musculoskeletal: Degenerative change throughout spine. There are no acute or suspicious osseous abnormalities. IMPRESSION: 1. Esophagus is diffusely edematous with diffuse wall thickening and intraluminal fluid. No pneumatosis or pneumomediastinum. No CT findings to suggest necrosis. Similar edematous appearance of the stomach. 2. Moderate bilateral pleural effusions with adjacent compressive atelectasis. Minimal smooth septal thickening may reflect pulmonary edema. 3. Contour deformity of the lower left kidney likely related to lobular contours and motion, difficult to exclude exophytic lesion. Nonemergent renal ultrasound could be considered when patient is able. 4. Rather extensive colonic diverticulosis, no diverticulitis. Rectus sigmoid colonic wall thickening with adjacent edema, suggesting chronic constipation. 5. Question of nodular hepatic contours raising concern for cirrhosis. 6. Whole body wall edema.  Minimal abdominal ascites. 7. Thoracoabdominal aortic atherosclerosis. Coronary artery calcifications. Electronically Signed   By: Jeb Levering M.D.   On: 04/17/2016 22:00   Ct Cervical Spine Wo Contrast  Result Date: 04/15/2016 CLINICAL DATA:  Unwitnessed fall. Fell wall trying again out of bed. Trauma to head without loss of consciousness. Initial encounter. EXAM: CT HEAD WITHOUT CONTRAST CT CERVICAL SPINE WITHOUT CONTRAST TECHNIQUE: Multidetector CT imaging of the head and cervical spine was performed following the standard protocol without intravenous contrast. Multiplanar CT image reconstructions of the cervical spine were also generated. COMPARISON:  CT head without contrast 03/29/2016. CT of the head and face 12/24/2015. FINDINGS: CT HEAD FINDINGS Moderate atrophy and  periventricular white matter hypoattenuation is stable. This is slightly advanced for age. The ventricles proportionate to the degree of atrophy. A remote infarct of the right cerebellum is stable. No acute infarct, hemorrhage, or mass lesion is present. No significant extraaxial fluid collection is present. Minimal soft tissue swelling is present in the left occipital sub scalp without an underlying fracture. The calvarium is intact. A remote right inferior orbital floor fracture has healed. The paranasal sinuses and mastoid air cells are clear. CT CERVICAL SPINE FINDINGS The cervical spine is imaged from the skullbase through T2-3. Asymmetric degenerative changes are present at the left atlantooccipital joint. Degenerative changes are present at C1-2. There is chronic loss of disc height from C3 through  C7. Vertebral body heights and alignment are maintained. Facet hypertrophy is worse on the right. Soft tissues the neck demonstrate thyroid atrophy. Atherosclerotic calcifications are present at the carotid bifurcations bilaterally without significant stenoses. A large right pleural effusion is present. The lung apices are otherwise clear. IMPRESSION: 1. Stable moderate atrophy and white matter disease. No acute intracranial abnormality. 2. Minimal soft tissue swelling in the left occipital scalp may be related to the acute trauma. There is no underlying fracture. The 3. Remote right inferior orbital floor fracture has healed. 4. Multilevel degenerative changes in the cervical spine. 5. New moderate-sized right pleural effusion. Recommend two-view chest x-ray for further evaluation. Electronically Signed   By: San Morelle M.D.   On: 04/15/2016 14:40  Ct Abdomen Pelvis W Contrast  Result Date: 04/17/2016 CLINICAL DATA:  Black esophagus on EGD today.  Rule out necrosis. EXAM: CT CHEST, ABDOMEN, AND PELVIS WITH CONTRAST TECHNIQUE: Multidetector CT imaging of the chest, abdomen and pelvis was performed  following the standard protocol during bolus administration of intravenous contrast. CONTRAST:  125mL ISOVUE-300 IOPAMIDOL (ISOVUE-300) INJECTION 61% COMPARISON:  Chest radiographs yesterday. No prior chest CT. CT abdomen/pelvis 08/04/2012 FINDINGS: CT CHEST FINDINGS Cardiovascular: Tortuosity of the thoracic aorta without aneurysm. There is moderate atherosclerosis. Conventional branching pattern from the aortic arch. No aortic dissection. Scattered coronary artery calcifications. Heart is normal in size. Mediastinum/Nodes: There is diffuse esophageal wall thickening from the thoracic inlet through the diaphragmatic hiatus. Intraluminal esophageal fluid. No evidence of pneumatosis. No pneumomediastinum. Small amount of paraesophageal fluid distally may be contiguous with right pleural effusion. Prominent right upper paratracheal node measures 10 mm short axis. Small lower paratracheal nodes not enlarged by size criteria. No evidence of hilar adenopathy. No axillary adenopathy. No pericardial effusion. Lungs/Pleura: Moderate bilateral pleural effusions, right greater than left. There is adjacent compressive atelectasis in both lower lobes. Mild patchy opacities in the anterior right upper lobe, left upper lobe to a lesser extent and superior segment left lower lobe. There is minimal smooth septal thickening. Trachea and mainstem bronchi are patent. Musculoskeletal: There are no acute or suspicious osseous abnormalities. CT ABDOMEN PELVIS FINDINGS Hepatobiliary: No focal hepatic lesion. Streak artifact from arms down positioning. Question of nodular hepatic contours. Postcholecystectomy. No biliary dilatation. Pancreas: Atrophic parenchyma. No ductal dilatation or inflammation. Spleen: Unremarkable. Adrenals/Urinary Tract: No adrenal nodule. Symmetric renal enhancement. No hydronephrosis. Motion artifact through the lower left kidney. Contour deformity is likely secondary to lobular contours and motion, less likely  focal renal lesion. Urinary bladder is physiologically distended. Stomach/Bowel: Diffuse wall thickening and edematous appearance. Small volume of intraluminal gastric fluid. No pneumatosis. Small bowel is decompressed. No definite small bowel thickening. Extensive colonic diverticulosis of the descending and sigmoid colon without acute diverticulitis. Scattered colonic diverticular throughout the remainder the colon. Appendix tentatively identified and normal. There is mild wall thickening at the rectosigmoid junction with adjacent perirectal fat stranding. Vascular/Lymphatic: Abdominal aortic atherosclerosis. No aneurysm. Circumaortic left renal vein. No retroperitoneal adenopathy. Reproductive: Uterus is atrophic, normal for age.  No adnexal mass. Other: Small fat containing upper ventral abdominal wall hernia. Small amount of ascites adjacent to the liver in the spleen. There is no free air. No intra-abdominal abscess. There is whole body wall edema. Musculoskeletal: Degenerative change throughout spine. There are no acute or suspicious osseous abnormalities. IMPRESSION: 1. Esophagus is diffusely edematous with diffuse wall thickening and intraluminal fluid. No pneumatosis or pneumomediastinum. No CT findings to suggest necrosis. Similar edematous appearance of the stomach. 2. Moderate  bilateral pleural effusions with adjacent compressive atelectasis. Minimal smooth septal thickening may reflect pulmonary edema. 3. Contour deformity of the lower left kidney likely related to lobular contours and motion, difficult to exclude exophytic lesion. Nonemergent renal ultrasound could be considered when patient is able. 4. Rather extensive colonic diverticulosis, no diverticulitis. Rectus sigmoid colonic wall thickening with adjacent edema, suggesting chronic constipation. 5. Question of nodular hepatic contours raising concern for cirrhosis. 6. Whole body wall edema.  Minimal abdominal ascites. 7. Thoracoabdominal  aortic atherosclerosis. Coronary artery calcifications. Electronically Signed   By: Jeb Levering M.D.   On: 04/17/2016 22:00   Dg Chest Port 1 View  Result Date: 04/16/2016 CLINICAL DATA:  Increased cough and congestion this morning. EXAM: PORTABLE CHEST 1 VIEW COMPARISON:  Chest x-rays dated 04/15/2016 and 03/29/2016. FINDINGS: Today's exam is slightly hypoinspiratory with crowding of the perihilar bronchovascular markings. New patchy airspace opacities are seen at the right lung base. Left lung remains clear. Cardiomediastinal silhouette is stable. Atherosclerotic changes noted at the aortic arch. Osseous structures about the chest are unremarkable. IMPRESSION: Low lung volumes. New patchy airspace opacities at the right lung base, atelectasis versus early developing pneumonia. Aortic atherosclerosis. Electronically Signed   By: Franki Cabot M.D.   On: 04/16/2016 08:16  Dg Chest Port 1 View  Result Date: 04/15/2016 CLINICAL DATA:  Unwitnessed fall today.  Pleural effusion. EXAM: PORTABLE CHEST 1 VIEW COMPARISON:  03/29/2016 FINDINGS: Heart is borderline in size. Mild vascular congestion. Bibasilar atelectasis. No visible effusions or pneumothorax. No acute bony abnormality. IMPRESSION: Borderline heart size with vascular congestion. Bibasilar atelectasis. Electronically Signed   By: Rolm Baptise M.D.   On: 04/15/2016 15:19  Dg Chest Portable 1 View  Result Date: 03/26/2016 CLINICAL DATA:  Weakness, hypotension EXAM: PORTABLE CHEST 1 VIEW COMPARISON:  06/03/2008 FINDINGS: Cardiomediastinal silhouette is stable. No acute infiltrate or pleural effusion. No pulmonary edema. Mild degenerative changes thoracic spine. Degenerative changes bilateral shoulders. IMPRESSION: No active disease. Electronically Signed   By: Lahoma Crocker M.D.   On: 03/26/2016 15:22   Dg Abd Portable 1v  Result Date: 04/16/2016 CLINICAL DATA:  Melena. Ongoing heartburn. Pelvic pain since this morning. EXAM: PORTABLE ABDOMEN - 1  VIEW COMPARISON:  None FINDINGS: Moderate stool burden throughout the colon. Nonobstructive bowel gas pattern. No free air, organomegaly or suspicious calcification. Degenerative changes in the lumbar spine and hips. No acute bony abnormality. IMPRESSION: Moderate stool burden.  No acute findings. Electronically Signed   By: Rolm Baptise M.D.   On: 04/16/2016 09:30   Time Spent in minutes  30  MAGICK-Burnis Halling M.D on 04/24/2016 at 7:00 AM  Between 7am to 7pm - Pager - [phone]  After 7pm go to [email or web address] - password [facility]  [facility] -  Office  [phone]

## 2016-04-24 NOTE — Progress Notes (Signed)
PARENTERAL NUTRITION CONSULT NOTE - Follow Up  Pharmacy Consult for TPN Indication: gastric outlet obstruction  Allergies  Allergen Reactions  . Ace Inhibitors Cough  . Prednisone     REACTION: lethargic  . Sitagliptin     Other reaction(s): Unknown    Patient Measurements: Height: 5\' 2"  (157.5 cm) Weight: 165 lb (74.8 kg) IBW/kg (Calculated) : 50.1 Adjusted Body Weight: 72.2 kg  Vital Signs: Temp: 97.5 F (36.4 C) (08/08 [id number]) Temp Source: Oral (08/08 [id number]) BP: 137/59 (08/08 [id number]) Pulse Rate: 81 (08/08 [id number]) Intake/Output from previous day: 08/07 0701 - 08/08 0700 In: 1066.3 [I.V.:766.3; IV Piggyback:300] Out: 950 [Urine:950] Intake/Output from this shift: Total I/O In: 820 [I.V.:720; IV Piggyback:100] Out: 250 [Urine:250]  Labs:  Recent Labs  04/21/16 2022 04/23/16 0430  WBC  --  6.0  HGB 9.1* 9.1*  HCT 28.2* 27.2*  PLT  --  174     Recent Labs  04/22/16 0455 04/23/16 0430 04/24/16 0335  NA 133* 133*  --   K 4.2 3.9  --   CL 104 104  --   CO2 25 26  --   GLUCOSE 199* 131*  --   BUN 16 18  --   CREATININE 0.71 0.66  --   CALCIUM 7.5* 7.6*  --   MG 1.8 1.6* 1.7  PHOS  --  2.5  --   PROT  --  4.6*  --   ALBUMIN  --  1.9*  --   AST  --  19  --   ALT  --  13*  --   ALKPHOS  --  41  --   BILITOT  --  0.5  --   PREALBUMIN  --  8.8*  --   TRIG  --  84  --    Estimated Creatinine Clearance: 54.9 mL/min (by C-G formula based on SCr of 0.8 mg/dL).    Recent Labs  04/23/16 2007 04/24/16 0039 04/24/16 0413  GLUCAP 174* 173* 139*    Medical History: Past Medical History:  Diagnosis Date  . DDD (degenerative disc disease), lumbar   . Diabetes mellitus   . Diverticulosis   . Endocervical polyp   . Endometrial polyp   . GERD (gastroesophageal reflux disease)   . Hiatal hernia   . Hx of adenomatous colonic polyps   . Hyperlipidemia   . Hypertension   . Hyponatremia     Medications:  Scheduled:  . ampicillin-sulbactam (UNASYN) IV   1.5 g Intravenous Q6H  . antiseptic oral rinse  7 mL Mouth Rinse q12n4p  . bisacodyl  10 mg Rectal Daily  . chlorhexidine  15 mL Mouth Rinse BID  . erythromycin   Both Eyes QHS  . fluconazole (DIFLUCAN) IV  200 mg Intravenous Q24H  . insulin aspart  0-20 Units Subcutaneous Q4H  . insulin aspart  14 Units Subcutaneous Once  . pantoprazole  40 mg Intravenous Q12H  . sodium chloride flush  10-40 mL Intracatheter Q12H  . sodium chloride flush  3 mL Intravenous Q12H   Infusions:  . Marland KitchenTPN (CLINIMIX-E) Adult 60 mL/hr at 04/23/16 1725   And  . fat emulsion 192 mL (04/23/16 1726)  . 0.45 % NaCl with KCl 20 mEq / L 30 mL/hr at 04/22/16 1647    Insulin Requirements: resistant SSI q4h and 25 units insulin in TPN bag; 26 units of SSI in 24 hrs  Current Nutrition: NPO  IVF: 0.45%NS with 20KCl at 30 ml/hr  Central access: PICC placed  8/2 TPN start date: 8/2  ASSESSMENT                                                                                                          HPI: 81 y.o.female,With history of essential hypertension, diabetes mellitus type 2, dyslipidemia, peptic ulcer disease, poor balance with multiple falls.  Admitted for dark stools which were heme positive with generalized weakness. EGD 7/31 diffuse esophagitis and a black necrotic appearing esophagus, pyloric mass causing gastric outlet obstruction. Biopsies pending. May need surgery. GI suggests TPN.  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  GI recom continuing NPO, IV PPI, and TPN. Biopsy of pylorus neg for malignancy  04/24/2016:   Glucose (goal <150): 139-174 with 25 units of insulin in TPN bag ; PTA glipizide & metformin on hold  Electrolytes:   Sodium slightly <goal  Magnesium up 1.7 s/p repletion on 8/7  Phos at lower end of goal range, K+ wnl on 8/8  Corrected Ca WNL   Renal: SCr  WNL  LFTs: Tbili slightly elevated on 7/30 but improved to WNL   TGs: 126 on 8/3  Prealbumin: 7.2 on 8/3  NUTRITIONAL GOALS                                                                                             RD recs 8/4: 1250-1400 kcal, 70-85g protein per day  Clinimix E 5/15 at a goal rate of 74ml/hr + 20% fat emulsion at 11ml/hr to provide: 72g/day protein, 1406Kcal/day.  PLAN                                                                                                                         - Magnesium sulfate 1 gm IV x1  At 1800 today:  Continue Clinimix E 5/15 at 60 ml/hr (goal rate) -- increase to 35 units of insulin in TPN bag  20% fat emulsion at 34ml/hr  TPN to contain standard multivitamins and trace elements.  Continue IVF at 107ml/hr - consider change to NS  Continue SSI resistant scale q4h.   TPN  lab panels on Mondays & Thursdays.  Dia Sitter, PharmD, BCPS 04/24/2016 7:00 AM

## 2016-04-24 NOTE — Progress Notes (Signed)
Physical Therapy Treatment Patient Details Name: DILIA TARVER MRN: [id number] DOB: 10/21/1934 Today's Date: 04/24/2016    History of Present Illness 81 yo female admitted with melena, hyperglycemia, fall, AMS. hx of essential HTN, DM, multiple falls.    PT Comments    Progressing with mobility.   Follow Up Recommendations  SNF     Equipment Recommendations  None recommended by PT    Recommendations for Other Services       Precautions / Restrictions Precautions Precautions: Fall Restrictions Weight Bearing Restrictions: No    Mobility  Bed Mobility Overal bed mobility: Needs Assistance Bed Mobility: Supine to Sit     Supine to sit: Min assist;HOB elevated     General bed mobility comments: Increased time. Assist for upper body and to scoot to EOB. Pt used bedrail to assist as well.   Transfers Overall transfer level: Needs assistance Equipment used: Rolling walker (2 wheeled) Transfers: Sit to/from Stand Sit to Stand: Min assist;From elevated surface         General transfer comment: Assist to rise, stabilize, control descent. VCs safety, hand placement  Ambulation/Gait Ambulation/Gait assistance: Min assist Ambulation Distance (Feet): 40 Feet Assistive device: Rolling walker (2 wheeled) Gait Pattern/deviations: Step-through pattern;Decreased stride length     General Gait Details: assist to stabilize. Pt fatigues fairly easily.    Stairs            Wheelchair Mobility    Modified Rankin (Stroke Patients Only)       Balance                                    Cognition Arousal/Alertness: Awake/alert Behavior During Therapy: WFL for tasks assessed/performed Overall Cognitive Status: Within Functional Limits for tasks assessed                      Exercises General Exercises - Lower Extremity Quad Sets: AROM;Both;15 reps;Seated Long Arc Quad: AROM;Both;15 reps;Seated Hip Flexion/Marching: AROM;Both;15  reps;Seated    General Comments        Pertinent Vitals/Pain Pain Assessment: No/denies pain    Home Living                      Prior Function            PT Goals (current goals can now be found in the care plan section) Progress towards PT goals: Progressing toward goals    Frequency  Min 3X/week    PT Plan Current plan remains appropriate    Co-evaluation             End of Session Equipment Utilized During Treatment: Gait belt Activity Tolerance: Patient tolerated treatment well Patient left: in chair;with call bell/phone within reach     Time: 1032-1045 PT Time Calculation (min) (ACUTE ONLY): 13 min  Charges:  $Gait Training: 8-22 mins                    G Codes:      Weston Anna, MPT Pager: [phone]

## 2016-04-25 ENCOUNTER — Inpatient Hospital Stay (HOSPITAL_COMMUNITY): Payer: Medicare Other

## 2016-04-25 DIAGNOSIS — K228 Other specified diseases of esophagus: Secondary | ICD-10-CM

## 2016-04-25 DIAGNOSIS — K2289 Other specified disease of esophagus: Secondary | ICD-10-CM

## 2016-04-25 DIAGNOSIS — T17908A Unspecified foreign body in respiratory tract, part unspecified causing other injury, initial encounter: Secondary | ICD-10-CM

## 2016-04-25 DIAGNOSIS — Z794 Long term (current) use of insulin: Secondary | ICD-10-CM

## 2016-04-25 DIAGNOSIS — W19XXXD Unspecified fall, subsequent encounter: Secondary | ICD-10-CM

## 2016-04-25 DIAGNOSIS — E1169 Type 2 diabetes mellitus with other specified complication: Secondary | ICD-10-CM

## 2016-04-25 DIAGNOSIS — D131 Benign neoplasm of stomach: Secondary | ICD-10-CM

## 2016-04-25 DIAGNOSIS — K922 Gastrointestinal hemorrhage, unspecified: Secondary | ICD-10-CM

## 2016-04-25 DIAGNOSIS — T17998D Other foreign object in respiratory tract, part unspecified causing other injury, subsequent encounter: Secondary | ICD-10-CM

## 2016-04-25 DIAGNOSIS — K222 Esophageal obstruction: Secondary | ICD-10-CM

## 2016-04-25 DIAGNOSIS — W19XXXA Unspecified fall, initial encounter: Secondary | ICD-10-CM

## 2016-04-25 DIAGNOSIS — Z5189 Encounter for other specified aftercare: Secondary | ICD-10-CM

## 2016-04-25 DIAGNOSIS — K219 Gastro-esophageal reflux disease without esophagitis: Secondary | ICD-10-CM

## 2016-04-25 LAB — GLUCOSE, CAPILLARY
GLUCOSE-CAPILLARY: 159 mg/dL — AB (ref 65–99)
GLUCOSE-CAPILLARY: 144 mg/dL — AB (ref 65–99)
GLUCOSE-CAPILLARY: 86 mg/dL (ref 65–99)
Glucose-Capillary: 132 mg/dL — ABNORMAL HIGH (ref 65–99)
Glucose-Capillary: 124 mg/dL — ABNORMAL HIGH (ref 65–99)
Glucose-Capillary: 121 mg/dL — ABNORMAL HIGH (ref 65–99)

## 2016-04-25 LAB — CBC
HCT: 26.6 % — ABNORMAL LOW (ref 36.0–46.0)
Hemoglobin: 9 g/dL — ABNORMAL LOW (ref 12.0–15.0)
MCH: 29.7 pg (ref 26.0–34.0)
MCHC: 33.8 g/dL (ref 30.0–36.0)
MCV: 87.8 fL (ref 78.0–100.0)
PLATELETS: 184 10*3/uL (ref 150–400)
RBC: 3.03 MIL/uL — ABNORMAL LOW (ref 3.87–5.11)
RDW: 16 % — AB (ref 11.5–15.5)
WBC: 4.5 10*3/uL (ref 4.0–10.5)

## 2016-04-25 LAB — BASIC METABOLIC PANEL
ANION GAP: 2 — AB (ref 5–15)
BUN: 17 mg/dL (ref 6–20)
CALCIUM: 7.7 mg/dL — AB (ref 8.9–10.3)
CO2: 26 mmol/L (ref 22–32)
CREATININE: 0.62 mg/dL (ref 0.44–1.00)
Chloride: 104 mmol/L (ref 101–111)
GLUCOSE: 156 mg/dL — AB (ref 65–99)
Potassium: 4.3 mmol/L (ref 3.5–5.1)
Sodium: 132 mmol/L — ABNORMAL LOW (ref 135–145)

## 2016-04-25 LAB — MAGNESIUM: MAGNESIUM: 1.7 mg/dL (ref 1.7–2.4)

## 2016-04-25 MED ORDER — MAGNESIUM SULFATE IN D5W 1-5 GM/100ML-% IV SOLN
1.0000 g | Freq: Once | INTRAVENOUS | Status: AC
  Administered 2016-04-25: 1 g via INTRAVENOUS
  Filled 2016-04-25: qty 100

## 2016-04-25 MED ORDER — FAT EMULSION 20 % IV EMUL
192.0000 mL | INTRAVENOUS | Status: AC
Start: 2016-04-25 — End: 2016-04-26
  Administered 2016-04-25: 192 mL via INTRAVENOUS
  Filled 2016-04-25: qty 200

## 2016-04-25 MED ORDER — POTASSIUM CHLORIDE IN NACL 20-0.9 MEQ/L-% IV SOLN
INTRAVENOUS | Status: DC
  Administered 2016-04-25: 17:00:00 via INTRAVENOUS
  Filled 2016-04-25 (×3): qty 1000

## 2016-04-25 MED ORDER — TRACE MINERALS CR-CU-MN-SE-ZN 10-1000-500-60 MCG/ML IV SOLN
INTRAVENOUS | Status: AC
  Administered 2016-04-25: 18:00:00 via INTRAVENOUS
  Filled 2016-04-25: qty 1440

## 2016-04-25 NOTE — Clinical Social Work Placement (Signed)
CSW met with patient re: SNF placement at discharge. CSW explained that if patient is still on TPN at discharge that [facility] (which she had originally expressed interest in) does not take TPN. CSW provided SNF bed offers & explained that she would be able to choose from [facility], [facility] or [facility] which all take TPN.   CSW will continue to follow.    Raynaldo Opitz, [facility] Clinical Social Worker cell #: [phone]     CLINICAL SOCIAL WORK PLACEMENT  NOTE  Date:  04/25/2016  Patient Details  Name: [name] [name] MRN: [id number] Date of Birth: 04-24-1935  Clinical Social Work is seeking post-discharge placement for this patient at the [place] level of care (*CSW will initial, date and re-position this form in  chart as items are completed):  Yes   Patient/family provided with [facility] Work Department's list of facilities offering this level of care within the geographic area requested by the patient (or if unable, by the patient's family).  Yes   Patient/family informed of their freedom to choose among providers that offer the needed level of care, that participate in Medicare, Medicaid or managed care program needed by the patient, have an available bed and are willing to accept the patient.  Yes   Patient/family informed of [facility]'s ownership interest in [facility] and [facility], as well as of the fact that they are under no obligation to receive care at these facilities.  PASRR submitted to EDS on 04/25/16     PASRR number received on 04/25/16     Existing PASRR number confirmed on       FL2 transmitted to all facilities in geographic area requested by pt/family on 04/25/16     FL2 transmitted to all facilities within larger geographic area on       Patient informed that his/her managed care company has contracts with or will negotiate with certain facilities, including  the following:        Yes   Patient/family informed of bed offers received.  Patient chooses bed at [facility]     Physician recommends and patient chooses bed at      Patient to be transferred to   on  .  Patient to be transferred to facility by       Patient family notified on   of transfer.  Name of family member notified:        PHYSICIAN       Additional Comment:    _______________________________________________ Standley Brooking, LCSW 04/25/2016, 2:56 PM

## 2016-04-25 NOTE — Progress Notes (Signed)
PARENTERAL NUTRITION CONSULT NOTE - Follow Up  Pharmacy Consult for TPN Indication: gastric outlet obstruction  Allergies  Allergen Reactions  . Ace Inhibitors Cough  . Prednisone     REACTION: lethargic  . Sitagliptin     Other reaction(s): Unknown    Patient Measurements: Height: 5\' 2"  (157.5 cm) Weight: 176 lb 3.2 oz (79.9 kg) IBW/kg (Calculated) : 50.1 Adjusted Body Weight: 72.2 kg  Vital Signs: Temp: 97.5 F (36.4 C) (08/09 0422) Temp Source: Oral (08/09 0422) BP: 136/59 (08/09 0422) Pulse Rate: 76 (08/09 0422) Intake/Output from previous day: 08/08 0701 - 08/09 0700 In: 210 [I.V.:10; IV Piggyback:200] Out: 500 [Urine:500] Intake/Output from this shift: No intake/output data recorded.  Labs:  Recent Labs  04/23/16 0430 04/25/16 0500  WBC 6.0 4.5  HGB 9.1* 9.0*  HCT 27.2* 26.6*  PLT 174 184     Recent Labs  04/23/16 0430 04/24/16 0335 04/25/16 0500  NA 133*  --  132*  K 3.9  --  4.3  CL 104  --  104  CO2 26  --  26  GLUCOSE 131*  --  156*  BUN 18  --  17  CREATININE 0.66  --  0.62  CALCIUM 7.6*  --  7.7*  MG 1.6* 1.7 1.7  PHOS 2.5  --   --   PROT 4.6*  --   --   ALBUMIN 1.9*  --   --   AST 19  --   --   ALT 13*  --   --   ALKPHOS 41  --   --   BILITOT 0.5  --   --   PREALBUMIN 8.8*  --   --   TRIG 84  --   --    Estimated Creatinine Clearance: 56.7 mL/min (by C-G formula based on SCr of 0.8 mg/dL).    Recent Labs  04/24/16 2016 04/25/16 0013 04/25/16 0419  GLUCAP 149* 132* 159*    Medical History: Past Medical History:  Diagnosis Date  . DDD (degenerative disc disease), lumbar   . Diabetes mellitus   . Diverticulosis   . Endocervical polyp   . Endometrial polyp   . GERD (gastroesophageal reflux disease)   . Hiatal hernia   . Hx of adenomatous colonic polyps   . Hyperlipidemia   . Hypertension   . Hyponatremia     Medications:  Scheduled:  . ampicillin-sulbactam (UNASYN) IV  1.5 g Intravenous Q6H  . antiseptic oral  rinse  7 mL Mouth Rinse q12n4p  . bisacodyl  10 mg Rectal Daily  . chlorhexidine  15 mL Mouth Rinse BID  . erythromycin   Both Eyes QHS  . fluconazole (DIFLUCAN) IV  200 mg Intravenous Q24H  . insulin aspart  0-20 Units Subcutaneous Q4H  . insulin aspart  14 Units Subcutaneous Once  . pantoprazole  40 mg Intravenous Q12H  . sodium chloride flush  10-40 mL Intracatheter Q12H  . sodium chloride flush  3 mL Intravenous Q12H   Infusions:  . Marland KitchenTPN (CLINIMIX-E) Adult 60 mL/hr at 04/24/16 1714   And  . fat emulsion 192 mL (04/24/16 1715)  . 0.45 % NaCl with KCl 20 mEq / L 30 mL/hr at 04/22/16 1647    Insulin Requirements: resistant SSI q4h and 35 units insulin in TPN bag; 10 units SSI from 6P- 6A, estimated 20 units of SSI in 24 hrs  Current Nutrition: NPO  IVF: 0.45%NS with 20KCl at 30 ml/hr  Central access: PICC placed 8/2  TPN start date: 8/2  ASSESSMENT                                                                                                          HPI: 81 y.o.female,With history of essential hypertension, diabetes mellitus type 2, dyslipidemia, peptic ulcer disease, poor balance with multiple falls.  Admitted for dark stools which were heme positive with generalized weakness. EGD 7/31 diffuse esophagitis and a black necrotic appearing esophagus, pyloric mass causing gastric outlet obstruction. Biopsies pending. May need surgery. GI suggests TPN.  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  GI recom continuing NPO, IV PPI, and TPN. Biopsy of pylorus neg for malignancy  04/25/2016:   Glucose (goal <150): 132-159 with 35 units of insulin in TPN bag ; PTA glipizide & metformin on hold  Electrolytes:   Sodium slightly <goal  Magnesium 1.7 s/p repletion for the past couple of days  Phos at lower end of goal range on 8/7, K+ wnl on 8/9  Corrected Ca WNL    Renal: SCr WNL  LFTs: Tbili slightly elevated on 7/30 but improved to WNL   TGs: 126 on 8/3  Prealbumin: 7.2 on 8/3  NUTRITIONAL GOALS                                                                                             RD recs 8/4: 1250-1400 kcal, 70-85g protein per day  Clinimix E 5/15 at a goal rate of 31ml/hr + 20% fat emulsion at 3ml/hr to provide: 72g/day protein, 1406Kcal/day.  PLAN                                                                                                                         - Magnesium sulfate 1 gm IV x1  At 1800 today:  Continue Clinimix E 5/15 at 60 ml/hr (goal rate) -- continue with 35 units of insulin in TPN bag  20% fat emulsion at 44ml/hr  TPN to contain standard multivitamins and trace elements.  Change IVF to NS with 20 mEq KCL at 84ml/hr   Continue SSI  resistant scale q4h.   TPN lab panels on Mondays & Thursdays.  Dia Sitter, PharmD, BCPS 04/25/2016 7:30 AM

## 2016-04-25 NOTE — Progress Notes (Signed)
Nutrition Follow-up  DOCUMENTATION CODES:   Not applicable  INTERVENTION:  - TPN per pharmacy. - Diet advancement as medically feasible. - RD will follow-up 8/11.  NUTRITION DIAGNOSIS:   Inadequate oral intake related to inability to eat as evidenced by NPO status. -ongoing  GOAL:   Patient will meet greater than or equal to 90% of their needs -met with current TPN regimen.  MONITOR:   Diet advancement, Weight trends, Labs, I & O's, Other (Comment) (TPN regimen)  ASSESSMENT:   [name] [name]  is a 81 y.o. female, With history of essential hypertension, diabetes mellitus type 2, dyslipidemia, peptic ulcer disease, poor balance with multiple falls recently started using a walker still lives at home whose been experiencing some dark stools for the last few days along with generalized weakness.  8/9 Per chart review, pt +9.3L since admission (+8.7 kg). Pt remains NPO since 04/17/16 and is currently receiving TPN at goal rate: Clinimix E 5/15 @ 60 mL/hr with 20% lipids @ 8 mL/hr and 35 units of insulin/bag. This regimen is providing 72 grams of protein, 1406 kcal which meets 100% estimated nutrition needs. This provides 1440 mL fluid/24 hours.   GI saw pt earlier this AM and talked with pt about advancement to CLD at that time; pt agreeable to trying sips of water at that time.   Medications reviewed; sliding scale Novolog, 1 g IV Mg sulfate x1 dose yesterday, 1 g IV Mg sulfate x1 dose today, PRN IV Zofran, 40 mg IV Protonix BID. Labs reviewed; CBGs: 132-159 mg/dL this AM, Na: 090 mmol/L, Ca: 7.7 mg/dL.  IVF: 1/2 NS-20 mEq KCl @ 30 mL/hr (720 mL fluid/24 hours).   8/7 - Pt's weight now trending back toward admission weight (+3.1 kg since admission).  - Pt remains NPO since previous assessment.  - She underwent repeat EGD yesterday AM with findings of:  3 cm hiatal hernia. 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 Non-bleeding gastric ulcer with no stigmata of bleeding. Gastric stenosis was found at the pylorus due to a gastric mass - this does not appear consistent with hyperplastic polyp as initially reported but possible. Additional biopsies obtained to rule out malignancy. Multiple non-bleeding duodenal ulcers with no stigmata of bleeding, improved from previous exam.  Pt currently receiving goal rate of TPN: Clinimix E 5/15 @ 60 mL/hr with 20% lipids @ 8 mL/hr. This regimen is providing 72 grams of protein, 1406 kcal to meet 100% estimated protein and kcal needs. Per pharmacy, plan to continue this order at this time.   IVF: 1/2 NS-20 mEq KCl @ 30 mL/hr.    8/4 - Pt on CLD 7/31-8/1 at which time she was made NPO.  - TPN initiated yesterday d/t gastric outlet obstruction and is s/p biopsy.  - Pt does not have NGT.  - GI note this AM states: Esophageal, gastric and duodenal bx - no maligancy, no hpylori-esophagus with ulcerated mucosa, necrotic debri and fungi - Per chart review, pt weight trending up since admission and CBW is +7.3 kg from admission weight.  Pt currently receiving Clinimix E 5/15 @ 40 mL/hr with 20% lipids @ 10 mL/hr. Per pharmacy note this AM, plan to increase TPN to goal rate today at 1800: Clinimix E 5/15 @ 60 mL/hr with 20% lipids @ 10 mL/hr and 8 units insulin in TPN bag. This regimen will provide 1502 kcal (107% estimated kcal need), 72 grams  of protein (100% estimated protein need).   IVF: 1/2 NS-20 mEq KCl @ 50 mL/hr.    Diet Order:  .TPN (CLINIMIX-E) Adult Diet clear liquid Room service appropriate? Yes; Fluid consistency: Thin  Skin:  Reviewed, no issues (L arm wound)  Last BM:  8/5  Height:   Ht Readings from Last 1 Encounters:  04/19/16 '5\' 2"'$  (1.575 m)    Weight:   Wt Readings from Last 1 Encounters:  04/25/16 176 lb 3.2 oz (79.9 kg)    Ideal Body Weight:  50 kg  BMI:  Body mass index is 32.23  kg/m.  Estimated Nutritional Needs:   Kcal:  1250-1400 calories  Protein:  70-85 grams  Fluid:  >/= 1.25L  EDUCATION NEEDS:   No education needs identified at this time    Jarome Matin, MS, RD, LDN Inpatient Clinical Dietitian Pager # [phone] After hours/weekend pager # [phone]

## 2016-04-25 NOTE — Progress Notes (Addendum)
Progress Note    [name] [name]  [id number] DOB: May 03, 1935  DOA: 04/15/2016 PCP: [name], MD    Brief Narrative:   [name] [name] is an 81 y.o. female with a PMH of essential hypertension, diabetes mellitus type 2, dyslipidemia, peptic ulcer disease, poor balance with multiple falls recently Who was admitted 04/15/16 for evaluation of bloody stools. In the ER patient was found to have heme positive stool, and appeared slightly weak, dehydrated and pale.  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 CT scan with no evidence of pneumotosis or pneumomediastinum related to the esophagus findings, although she was felt to be at risk for this. Per GI, care is supportive with IVF, broad spectrum antibiotics, and IV PPI. Remains on IV Unasyn and antifungal therapy with Diflucan. Biopsies of pylorus did not show any malignant cells, esophageal biopsies with acute inflammation, necrotic debri and fungal forms. Continue nutritional support with TPN. Repeat EGD done 04/22/16 with biopsies also negative for malignancy. Diet advanced to clears. Plan for EGD with endoscopic ultrasound in the next few weeks. Hemoglobin stable after receiving 1 unit of PRBCs 04/21/16.[name]  Active problems:  GERD with esophagitis  Continue PPI.  Generalized weakness with multiple falls.  Being followed by physical therapy with recommendations for SNF placement.  Essential hypertension Continue hydralazine as needed.  Dehydration with hyponatremia and hypokalemia Dehydration resolved with IV fluids.  Potassium WNL. Sodium still slightly low.  Mild hematemesis with right-sided aspiration pneumonitis on 04/16/2016.  Remains on Unasyn.  DM type II.  Currently being managed with insulin resistant SSI every 4 hours. CBGs 132-188.  Family Communication/Anticipated D/C date and plan/Code Status   DVT prophylaxis: Lovenox ordered. Code Status: Full Code.  Family Communication: No family at the bedside. Disposition Plan: SNF when stable, off TNA, diet advanced.   Medical Consultants:    Gastroenterology   Procedures:   Upper endoscopy 04/17/16 and 04/22/16  Anti-Infectives:   Unasyn 04/16/16---> Diflucan 04/20/16--->  Subjective:    [name] [name] feels weak. She continues to report some centralized chest discomfort. Afraid to take in anything by mouth.  Objective:    Vitals:   04/24/16 0638 04/24/16 1308 04/24/16 2300 04/25/16 0422  BP: (!) 137/59 (!) 142/54 (!) 137/54 (!) 136/59  Pulse: 81 70 76 76  Resp: 18 18 18 16   Temp: 97.5 F (36.4 C) 97.5 F (36.4 C) 97.7 F (36.5 C) 97.5 F (36.4 C)  TempSrc: Oral Oral Oral Oral  SpO2:  95% 97% 99%  Weight:    79.9 kg (176 lb 3.2 oz)  Height:        Intake/Output Summary (Last 24 hours) at 04/25/16 0853 Last data filed at 04/25/16 0257  Gross per 24 hour  Intake              160 ml  Output              500 ml  Net             -340 ml   Filed Weights   04/23/16 0500 04/24/16 0500 04/25/16 0422  Weight: 74.3 kg (163 lb 12.8 oz) 74.8 kg (165 lb)  79.9 kg (176 lb 3.2 oz)    Exam: General exam: Appears calm and comfortable. Weak appearing. Respiratory system: Clear to auscultation. Respiratory effort normal. Cardiovascular system: S1 & S2 heard, RRR. No JVD,  rubs, gallops or clicks. No murmurs. Gastrointestinal system: Abdomen is nondistended, soft and nontender. No organomegaly or masses felt. Normal bowel sounds heard. Central nervous system: Alert and oriented. No focal neurological deficits. Extremities: No  clubbing,  or cyanosis. No edema. Skin: No rashes, lesions or ulcers. Psychiatry: Judgement and insight appear normal. Mood & affect depressed.   Data Reviewed:   I have personally reviewed following labs and imaging studies:  Labs: Basic Metabolic Panel:  Recent Labs Lab 04/18/16 1800 04/19/16 0900 04/20/16 0609 04/21/16 0500 04/22/16 0455 04/23/16 0430 04/24/16 0335 04/25/16 0500  NA 134* 132* 136 133* 133* 133*  --  132*  K 4.2 3.9 3.5 3.8 4.2 3.9  --  4.3  CL 109 107 108 106 104 104  --  104  CO2 21* 22 23 24 25 26   --  26  GLUCOSE 231* 295* 123* 203* 199* 131*  --  156*  BUN 13 12 12 15 16 18   --  17  CREATININE 0.86 0.81 0.70 0.69 0.71 0.66  --  0.62  CALCIUM 8.1* 8.0* 7.9* 7.4* 7.5* 7.6*  --  7.7*  MG 1.3* 1.4* 1.9 1.6* 1.8 1.6* 1.7 1.7  PHOS 1.3* 1.8*  --  2.6  --  2.5  --   --    GFR Estimated Creatinine Clearance: 56.7 mL/min (by C-G formula based on SCr of 0.8 mg/dL). Liver Function Tests:  Recent Labs Lab 04/19/16 0900 04/23/16 0430  AST 18 19  ALT 15 13*  ALKPHOS 38 41  BILITOT 0.4 0.5  PROT 4.6* 4.6*  ALBUMIN 2.0* 1.9*    CBC:  Recent Labs Lab 04/19/16 0900 04/20/16 0609 04/21/16 0500 04/21/16 2022 04/23/16 0430 04/25/16 0500  WBC 6.4 6.4 6.3  --  6.0 4.5  NEUTROABS 4.8  --   --   --  4.0  --   HGB 7.9* 8.3* 7.3* 9.1* 9.1* 9.0*  HCT 23.3* 25.2* 22.0* 28.2* 27.2* 26.6*  MCV 88.3 86.9 88.0  --  87.5 87.8  PLT 221 220 198  --  174 184   CBG:  Recent Labs Lab 04/24/16 1559 04/24/16 2016 04/25/16 0013 04/25/16 0419 04/25/16 0736  GLUCAP 188* 149* 132* 159* 144*   Lipid Profile:  Recent Labs  04/23/16 0430  TRIG 93   Microbiology Recent Results (from the past 240 hour(s))  MRSA PCR Screening     Status: None   Collection Time: 04/17/16  6:27 PM  Result Value Ref Range Status   MRSA by PCR NEGATIVE NEGATIVE Final    Comment:        The GeneXpert MRSA Assay (FDA approved for NASAL specimens only), is one component of  a comprehensive MRSA colonization surveillance program. It is not intended to diagnose MRSA infection nor to guide or monitor treatment for MRSA infections.   MRSA PCR Screening     Status: None   Collection Time: 04/21/16 12:31 PM  Result Value Ref Range Status   MRSA by PCR NEGATIVE NEGATIVE Final    Comment:        The GeneXpert MRSA Assay (FDA approved for NASAL specimens only), is one component of a comprehensive MRSA colonization surveillance program. It is not intended to diagnose MRSA infection nor to guide or monitor treatment for MRSA infections.  Radiology: No results found.  Medications:   . ampicillin-sulbactam (UNASYN) IV  1.5 g Intravenous Q6H  . antiseptic oral rinse  7 mL Mouth Rinse q12n4p  . bisacodyl  10 mg Rectal Daily  . chlorhexidine  15 mL Mouth Rinse BID  . erythromycin   Both Eyes QHS  . fluconazole (DIFLUCAN) IV  200 mg Intravenous Q24H  . insulin aspart  0-20 Units Subcutaneous Q4H  . insulin aspart  14 Units Subcutaneous Once  . magnesium sulfate 1 - 4 g bolus IVPB  1 g Intravenous Once  . pantoprazole  40 mg Intravenous Q12H  . sodium chloride flush  10-40 mL Intracatheter Q12H  . sodium chloride flush  3 mL Intravenous Q12H   Continuous Infusions: . Marland KitchenTPN (CLINIMIX-E) Adult 60 mL/hr at 04/24/16 1714   And  . fat emulsion 192 mL (04/24/16 1715)  . 0.45 % NaCl with KCl 20 mEq / L 30 mL/hr at 04/22/16 1647    Time spent: 25 minutes.   LOS: 10 days   [facility] Pager [phone]. If unable to reach me by pager, please call my cell phone at [phone].  *Please refer to [email or web address], password [email or web address] to get updated schedule on who will round on this patient, as hospitalists switch teams weekly. If 7PM-7AM, please contact night-coverage at [email or web address], password [email or web address] for any overnight needs.  04/25/2016, 8:53 AM

## 2016-04-25 NOTE — Progress Notes (Signed)
[facility] Progress Note  Subjective:  Feels ok.  Skeptical to try some clear liquids, but agrees and says that she would just like to try some water first.  Objective:  Vital signs in last 24 hours: Temp:  [97.5 F (36.4 C)-97.7 F (36.5 C)] 97.5 F (36.4 C) (08/09 0422) Pulse Rate:  [70-76] 76 (08/09 0422) Resp:  [16-18] 16 (08/09 0422) BP: (136-142)/(54-59) 136/59 (08/09 0422) SpO2:  [95 %-99 %] 99 % (08/09 0422) Weight:  [176 lb 3.2 oz (79.9 kg)] 176 lb 3.2 oz (79.9 kg) (08/09 0422) Last BM Date: 04/21/16 General:  Alert, Well-developed, in NAD Heart:  Regular rate and rhythm; no murmurs Pulm:  Decreased BS B/L. Abdomen:  Soft, non-distended.  BS present.  Non-tender. Extremities:  Without edema. Neurologic:  Alert and oriented x 4;  grossly normal neurologically. Psych:  Alert and cooperative. Normal mood and affect.  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 Bx of pylorus did not prove malignancy; even repeat biopsy showed hyperplastic polyp.  Esophageal bx with ulceration, nectrotic debri,and fungal forms. Repeat EGD 8/6 showed mild improvement of the esophagus (? Stricture development). On TNA, Diflucan, BID PPI-continue. #2 anemia- hgb stable this AM. #3 Right pneumonia secondary to aspiration - on Unasyn #4 AODM #5 AMS- resolved     **Per Dr. Carlean Purl:  Will need to consider if EUS is an option but needs to wait for esophagus to heal more I would think.  Surgery may be needed but would not pursue that right now - without more certainty of dx and overall improvement. *Will repeat chest x-ray today to re-evaluate PNA and pleural effusions. *Will have patient try clear liquids today (she says that she will try some water first).  Spoke with patient's nurse and asked that they please monitor her today while she tries to drink and make sure she is not showing concerning signs for aspiration.  Also needs to remain upright while drinking.   LOS: 10 days   ZEHR, JESSICA D.  04/25/2016, 9:23 AM  Pager number [phone]     R[place] Attending   I have taken an interval history, reviewed the chart and examined the patient. I agree with the Advanced Practitioner's note, impression and recommendations.   CXR IMPRESSION: No pulmonary edema. Small bilateral pleural effusion with bilateral basilar atelectasis or infiltrate. Left arm PICC line with tip in SVC.  If tolerates clear liquids will try full liquids and also see about liquid nutrition supplements. Timing of next EGD to assess unclear to me at this time - would need time for esophagus to heal before EUs though could plan for an EGD/EUS in a few weeks gambling that esophagus would be ready then.   Glendell Docker  Simonne Maffucci, MD, [facility] [phone] (pager) [phone] after 5 PM, weekends and holidays  04/25/2016 2:17 PM'

## 2016-04-26 DIAGNOSIS — K228 Other specified diseases of esophagus: Principal | ICD-10-CM

## 2016-04-26 LAB — GLUCOSE, CAPILLARY
GLUCOSE-CAPILLARY: 116 mg/dL — AB (ref 65–99)
GLUCOSE-CAPILLARY: 127 mg/dL — AB (ref 65–99)
GLUCOSE-CAPILLARY: 148 mg/dL — AB (ref 65–99)
Glucose-Capillary: 119 mg/dL — ABNORMAL HIGH (ref 65–99)
Glucose-Capillary: 122 mg/dL — ABNORMAL HIGH (ref 65–99)
Glucose-Capillary: 132 mg/dL — ABNORMAL HIGH (ref 65–99)
Glucose-Capillary: 166 mg/dL — ABNORMAL HIGH (ref 65–99)

## 2016-04-26 LAB — COMPREHENSIVE METABOLIC PANEL
ALBUMIN: 1.9 g/dL — AB (ref 3.5–5.0)
ALK PHOS: 45 U/L (ref 38–126)
ALT: 13 U/L — ABNORMAL LOW (ref 14–54)
ANION GAP: 3 — AB (ref 5–15)
AST: 20 U/L (ref 15–41)
BILIRUBIN TOTAL: 0.6 mg/dL (ref 0.3–1.2)
BUN: 14 mg/dL (ref 6–20)
CO2: 27 mmol/L (ref 22–32)
Calcium: 7.8 mg/dL — ABNORMAL LOW (ref 8.9–10.3)
Chloride: 104 mmol/L (ref 101–111)
Creatinine, Ser: 0.65 mg/dL (ref 0.44–1.00)
GFR calc Af Amer: >60 mL/min (ref >60)
GFR calc non Af Amer: >60 mL/min (ref >60)
GLUCOSE: 121 mg/dL — AB (ref 65–99)
POTASSIUM: 3.9 mmol/L (ref 3.5–5.1)
SODIUM: 134 mmol/L — AB (ref 135–145)
TOTAL PROTEIN: 4.5 g/dL — AB (ref 6.5–8.1)

## 2016-04-26 LAB — MAGNESIUM: Magnesium: 1.6 mg/dL — ABNORMAL LOW (ref 1.7–2.4)

## 2016-04-26 LAB — PHOSPHORUS: Phosphorus: 3.3 mg/dL (ref 2.5–4.6)

## 2016-04-26 MED ORDER — TRACE MINERALS CR-CU-MN-SE-ZN 10-1000-500-60 MCG/ML IV SOLN
INTRAVENOUS | Status: DC
  Administered 2016-04-26: 18:00:00 via INTRAVENOUS
  Filled 2016-04-26: qty 1440

## 2016-04-26 MED ORDER — FAT EMULSION 20 % IV EMUL
192.0000 mL | INTRAVENOUS | Status: DC
  Administered 2016-04-26: 192 mL via INTRAVENOUS
  Filled 2016-04-26: qty 250

## 2016-04-26 MED ORDER — MAGNESIUM SULFATE IN D5W 1-5 GM/100ML-% IV SOLN
1.0000 g | Freq: Once | INTRAVENOUS | Status: AC
  Administered 2016-04-26: 1 g via INTRAVENOUS
  Filled 2016-04-26 (×2): qty 100

## 2016-04-26 NOTE — Clinical Social Work Placement (Signed)
Patient has a bed at [facility]. CSW has completed FL2 & will continue to follow and assist with discharge when ready.    Raynaldo Opitz, [facility] Clinical Social Worker cell #: [phone]     CLINICAL SOCIAL WORK PLACEMENT  NOTE  Date:  04/26/2016  Patient Details  Name: [name] [name] MRN: [id number] Date of Birth: 04-Aug-1935  Clinical Social Work is seeking post-discharge placement for this patient at the [place] level of care (*CSW will initial, date and re-position this form in  chart as items are completed):  Yes   Patient/family provided with [facility] Work Department's list of facilities offering this level of care within the geographic area requested by the patient (or if unable, by the patient's family).  Yes   Patient/family informed of their freedom to choose among providers that offer the needed level of care, that participate in Medicare, Medicaid or managed care program needed by the patient, have an available bed and are willing to accept the patient.  Yes   Patient/family informed of [facility]'s ownership interest in [facility] and [facility], as well as of the fact that they are under no obligation to receive care at these facilities.  PASRR submitted to EDS on 04/25/16     PASRR number received on 04/25/16     Existing PASRR number confirmed on       FL2 transmitted to all facilities in geographic area requested by pt/family on 04/25/16     FL2 transmitted to all facilities within larger geographic area on       Patient informed that his/her managed care company has contracts with or will negotiate with certain facilities, including the following:        Yes   Patient/family informed of bed offers received.  Patient chooses bed at [facility] and [facility] recommends and patient chooses bed at      Patient to be transferred [facility] on  .  Patient to be transferred to facility by       Patient family notified on   of transfer.  Name of family member notified:        PHYSICIAN       Additional Comment:    _______________________________________________ Standley Brooking, LCSW 04/26/2016, 3:37 PM

## 2016-04-26 NOTE — Progress Notes (Signed)
Progress Note    [name] [name]  [id number] DOB: 28-Dec-1934  DOA: 04/15/2016 PCP: [name], MD    Brief Narrative:   [name] [name] is an 81 y.o. female with a PMH of essential hypertension, diabetes mellitus type 2, dyslipidemia, peptic ulcer disease, poor balance with multiple falls recently Who was admitted 04/15/16 for evaluation of bloody stools. In the ER patient was found to have heme positive stool, and appeared slightly weak, dehydrated and pale.  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 CT scan with no evidence of pneumotosis or pneumomediastinum related to the esophagus findings, although she was felt to be at risk for this. Per GI, care is supportive with IVF, broad spectrum antibiotics, and IV PPI. Remains on IV Unasyn and antifungal therapy with Diflucan. Biopsies of pylorus did not show any malignant cells, esophageal biopsies with acute inflammation, necrotic debri and fungal forms. Continue nutritional support with TPN. Repeat EGD done 04/22/16 with biopsies also negative for malignancy. Diet advanced to clears. Plan for EGD with endoscopic ultrasound in the next few weeks. Hemoglobin stable after receiving 1 unit of PRBCs 04/21/16. Will need to D/C to SNF on TPN.  Active problems:  Hypomagnesemia Replete in TPN.  GERD with esophagitis  Continue PPI.  Generalized weakness with multiple falls.  Being followed by physical therapy with recommendations for SNF placement.  Essential hypertension Continue hydralazine as needed.  Dehydration with  hyponatremia and hypokalemia Dehydration resolved with IV fluids. Potassium WNL. Sodium still slightly low.  Mild hematemesis with right-sided aspiration pneumonitis on 04/16/2016.  Remains on Unasyn.  DM type II.  Currently being managed with insulin resistant SSI every 4 hours. CBGs 132-188.  Family Communication/Anticipated D/C date and plan/Code Status   DVT prophylaxis: Lovenox ordered. Code Status: Full Code.  Family Communication: No family at the bedside. Disposition Plan: SNF when stable, off TNA, diet advanced.   Medical Consultants:    Gastroenterology   Procedures:   Upper endoscopy 04/17/16 and 04/22/16  Anti-Infectives:   Unasyn 04/16/16---> Diflucan 04/20/16--->  Subjective:    [name] [name] feels weak. She continues to report some centralized chest discomfort. Afraid to take in anything by mouth---thinks it will make her nauseated/vomit. Only tried a few sips of water.    Objective:    Vitals:   04/25/16 0422 04/25/16 1519 04/25/16 2021 04/26/16 0431  BP: (!) 136/59 133/77 (!) 141/62 136/62  Pulse: 76 74 80 72  Resp: 16 20 18 18   Temp: 97.5 F (36.4 C) 97.9 F (36.6 C) 98.6 F (37 C) 98.2 F (36.8 C)  TempSrc: Oral Oral Oral Axillary  SpO2: 99% 100% 97% 98%  Weight: 79.9 kg (176 lb 3.2 oz)   80.2 kg (176 lb 12.9 oz)  Height:        Intake/Output Summary (Last 24 hours) at 04/26/16 0829 Last data filed at 04/26/16 0552  Gross per 24 hour  Intake           1930.4 ml  Output             1000 ml  Net            930.4  ml   Filed Weights   04/24/16 0500 04/25/16 0422 04/26/16 0431  Weight: 74.8 kg (165 lb) 79.9 kg (176 lb 3.2 oz) 80.2 kg (176 lb 12.9 oz)    Exam: General exam: Appears calm and comfortable. Weak appearing. Respiratory system: Clear to auscultation. Respiratory effort normal. Cardiovascular system: S1 & S2 heard, RRR. No JVD,  rubs, gallops or clicks. No murmurs. Gastrointestinal system: Abdomen is nondistended, soft and  nontender. No organomegaly or masses felt. Normal bowel sounds heard. Central nervous system: Alert and oriented. No focal neurological deficits. Extremities: No clubbing,  or cyanosis. No edema. Skin: No rashes, lesions or ulcers. Psychiatry: Judgement and insight appear normal. Mood & affect depressed.   Data Reviewed:   I have personally reviewed following labs and imaging studies:  Labs: Basic Metabolic Panel:  Recent Labs Lab 04/19/16 0900  04/21/16 0500 04/22/16 0455 04/23/16 0430 04/24/16 0335 04/25/16 0500 04/26/16 0420  NA 132*  < > 133* 133* 133*  --  132* 134*  K 3.9  < > 3.8 4.2 3.9  --  4.3 3.9  CL 107  < > 106 104 104  --  104 104  CO2 22  < > 24 25 26   --  26 27  GLUCOSE 295*  < > 203* 199* 131*  --  156* 121*  BUN 12  < > 15 16 18   --  17 14  CREATININE 0.81  < > 0.69 0.71 0.66  --  0.62 0.65  CALCIUM 8.0*  < > 7.4* 7.5* 7.6*  --  7.7* 7.8*  MG 1.4*  < > 1.6* 1.8 1.6* 1.7 1.7 1.6*  PHOS 1.8*  --  2.6  --  2.5  --   --  3.3  < > = values in this interval not displayed. GFR Estimated Creatinine Clearance: 56.8 mL/min (by C-G formula based on SCr of 0.8 mg/dL). Liver Function Tests:  Recent Labs Lab 04/19/16 0900 04/23/16 0430 04/26/16 0420  AST 18 19 20   ALT 15 13* 13*  ALKPHOS 38 41 45  BILITOT 0.4 0.5 0.6  PROT 4.6* 4.6* 4.5*  ALBUMIN 2.0* 1.9* 1.9*    CBC:  Recent Labs Lab 04/19/16 0900 04/20/16 0609 04/21/16 0500 04/21/16 2022 04/23/16 0430 04/25/16 0500  WBC 6.4 6.4 6.3  --  6.0 4.5  NEUTROABS 4.8  --   --   --  4.0  --   HGB 7.9* 8.3* 7.3* 9.1* 9.1* 9.0*  HCT 23.3* 25.2* 22.0* 28.2* 27.2* 26.6*  MCV 88.3 86.9 88.0  --  87.5 87.8  PLT 221 220 198  --  174 184   CBG:  Recent Labs Lab 04/25/16 1620 04/25/16 2017 04/25/16 2358 04/26/16 0423 04/26/16 0742  GLUCAP 121* 86 122* 119* 132*   Lipid Profile: No results for input(s): CHOL, HDL, LDLCALC, TRIG, CHOLHDL, LDLDIRECT in the last 72 hours. Microbiology Recent Results  (from the past 240 hour(s))  MRSA PCR Screening     Status: None   Collection Time: 04/17/16  6:27 PM  Result Value Ref Range Status   MRSA by PCR NEGATIVE NEGATIVE Final    Comment:        The GeneXpert MRSA Assay (FDA approved for NASAL specimens only), is one component of a comprehensive MRSA colonization surveillance program. It is not intended to diagnose MRSA infection nor to guide or monitor treatment for MRSA infections.   MRSA PCR Screening     Status: None   Collection Time: 04/21/16 12:31 PM  Result Value Ref Range Status   MRSA by PCR NEGATIVE NEGATIVE Final    Comment:        The GeneXpert MRSA Assay (FDA approved for NASAL specimens only), is one component of a comprehensive MRSA colonization surveillance program. It is not intended to diagnose MRSA infection nor to guide or monitor treatment for MRSA infections.     Radiology: Dg Chest 2 View  Result Date: 04/25/2016 CLINICAL DATA:  Follow-up pleural effusion EXAM: CHEST  2 VIEW COMPARISON:  04/17/2016 FINDINGS: Cardiomediastinal silhouette is stable. There is elevation of the right hemidiaphragm. Small bilateral pleural effusion. Bilateral basilar atelectasis or infiltrate. Mild degenerative changes thoracic spine. No pulmonary edema. Left arm PICC line with tip in SVC. IMPRESSION: No pulmonary edema. Small bilateral pleural effusion with bilateral basilar atelectasis or infiltrate. Left arm PICC line with tip in SVC. Electronically Signed   By: Lahoma Crocker M.D.   On: 04/25/2016 11:21    Medications:   . ampicillin-sulbactam (UNASYN) IV  1.5 g Intravenous Q6H  . antiseptic oral rinse  7 mL Mouth Rinse q12n4p  . bisacodyl  10 mg Rectal Daily  . chlorhexidine  15 mL Mouth Rinse BID  . erythromycin   Both Eyes QHS  . fluconazole (DIFLUCAN) IV  200 mg Intravenous Q24H  . insulin aspart  0-20 Units Subcutaneous Q4H  . insulin aspart  14 Units Subcutaneous Once  . pantoprazole  40 mg Intravenous Q12H  .  sodium chloride flush  10-40 mL Intracatheter Q12H  . sodium chloride flush  3 mL Intravenous Q12H   Continuous Infusions: . Marland KitchenTPN (CLINIMIX-E) Adult 60 mL/hr at 04/25/16 1734   And  . fat emulsion 192 mL (04/25/16 1734)  . 0.9 % NaCl with KCl 20 mEq / L 30 mL/hr at 04/25/16 1632    Time spent: 25 minutes.   LOS: 11 days   [facility] Pager [phone]. If unable to reach me by pager, please call my cell phone at [phone].  *Please refer to [email or web address], password [email or web address] to get updated schedule on who will round on this patient, as hospitalists switch teams weekly. If 7PM-7AM, please contact night-coverage at [email or web address], password [email or web address] for any overnight needs.  04/26/2016, 8:29 AM

## 2016-04-26 NOTE — Progress Notes (Signed)
Physical Therapy Treatment Patient Details Name: [name] [name] MRN: [id number] DOB: [birth year] Today's Date: 04/26/2016    History of Present Illness 81 yo female admitted with melena, hyperglycemia, fall, AMS. hx of essential HTN, DM, multiple falls.    PT Comments    Pt required increased time and MAX encouragement.  Assisted OOB to [facility] then amb in hallway.  MAX c/o weakness/fatigue and weak knees.    Follow Up Recommendations  SNF     Equipment Recommendations  None recommended by PT    Recommendations for Other Services       Precautions / Restrictions Precautions Precautions: None    Mobility  Bed Mobility Overal bed mobility: Needs Assistance Bed Mobility: Supine to Sit     Supine to sit: Min assist;HOB elevated     General bed mobility comments: Increased time. Assist for upper body and to scoot to EOB. Pt used bedrail to assist as well.  Required extended sitting rest break EOB.   Transfers Overall transfer level: Needs assistance Equipment used: None Transfers: Sit to/from [facility] Sit to Stand: Min assist Stand pivot transfers: Min assist       General transfer comment: Assist to rise, stabilize, control descent. VCs safety, hand placement.  assisted from bed to [facility] with x 1 posterior LOB.    Ambulation/Gait Ambulation/Gait assistance: Min assist;Mod assist Ambulation Distance (Feet): 47 Feet Assistive device: Rolling walker (2 wheeled) Gait Pattern/deviations: Step-through pattern;Decreased stride length Gait velocity: decreased   General Gait Details: limited activity tolwerance with freq rest breaks.  Recliner following behind for safety.     Stairs            Wheelchair Mobility    Modified Rankin (Stroke Patients Only)       Balance                                    Cognition Arousal/Alertness: Awake/alert Behavior During Therapy: WFL for tasks assessed/performed Overall Cognitive  Status: Within Functional Limits for tasks assessed                      Exercises      General Comments        Pertinent Vitals/Pain Pain Assessment: No/denies pain    Home Living                      Prior Function            PT Goals (current goals can now be found in the care plan section) Progress towards PT goals: Progressing toward goals    Frequency  Min 3X/week    PT Plan Current plan remains appropriate    Co-evaluation             End of Session Equipment Utilized During Treatment: Gait belt Activity Tolerance: Patient limited by fatigue Patient left: in chair;with call bell/phone within reach     Time: 1122-1148 PT Time Calculation (min) (ACUTE ONLY): 26 min  Charges:  $Gait Training: 8-22 mins $Therapeutic Activity: 8-22 mins                    G Codes:      Rica Koyanagi  PTA [facility]  Acute  Rehab Pager      [phone]

## 2016-04-26 NOTE — Progress Notes (Signed)
PARENTERAL NUTRITION CONSULT NOTE - Follow Up  Pharmacy Consult for TPN Indication: gastric outlet obstruction  Allergies  Allergen Reactions  . Ace Inhibitors Cough  . Prednisone     REACTION: lethargic  . Sitagliptin     Other reaction(s): Unknown    Patient Measurements: Height: 5\' 2"  (157.5 cm) Weight: 176 lb 12.9 oz (80.2 kg) IBW/kg (Calculated) : 50.1 Adjusted Body Weight: 72.2 kg  Vital Signs: Temp: 98.2 F (36.8 C) (08/10 0431) Temp Source: Axillary (08/10 0431) BP: 136/62 (08/10 0431) Pulse Rate: 72 (08/10 0431) Intake/Output from previous day: 08/09 0701 - 08/10 0700 In: 1930.4 [P.O.:120; I.V.:1410.4; IV Piggyback:400] Out: 1000 [Urine:1000] Intake/Output from this shift: Total I/O In: -  Out: 300 [Urine:300]  Labs:  Recent Labs  04/25/16 0500  WBC 4.5  HGB 9.0*  HCT 26.6*  PLT 184     Recent Labs  04/24/16 0335 04/25/16 0500 04/26/16 0420  NA  --  132* 134*  K  --  4.3 3.9  CL  --  104 104  CO2  --  26 27  GLUCOSE  --  156* 121*  BUN  --  17 14  CREATININE  --  0.62 0.65  CALCIUM  --  7.7* 7.8*  MG 1.7 1.7 1.6*  PHOS  --   --  3.3  PROT  --   --  4.5*  ALBUMIN  --   --  1.9*  AST  --   --  20  ALT  --   --  13*  ALKPHOS  --   --  45  BILITOT  --   --  0.6   Estimated Creatinine Clearance: 56.8 mL/min (by C-G formula based on SCr of 0.8 mg/dL).    Recent Labs  04/25/16 2358 04/26/16 0423 04/26/16 0742  GLUCAP 122* 119* 132*    Medical History: Past Medical History:  Diagnosis Date  . DDD (degenerative disc disease), lumbar   . Diabetes mellitus   . Diverticulosis   . Endocervical polyp   . Endometrial polyp   . GERD (gastroesophageal reflux disease)   . Hiatal hernia   . Hx of adenomatous colonic polyps   . Hyperlipidemia   . Hypertension   . Hyponatremia     Medications:  Scheduled:  . ampicillin-sulbactam (UNASYN) IV  1.5 g Intravenous Q6H  . antiseptic oral rinse  7 mL Mouth Rinse q12n4p  . bisacodyl  10  mg Rectal Daily  . chlorhexidine  15 mL Mouth Rinse BID  . erythromycin   Both Eyes QHS  . fluconazole (DIFLUCAN) IV  200 mg Intravenous Q24H  . insulin aspart  0-20 Units Subcutaneous Q4H  . insulin aspart  14 Units Subcutaneous Once  . pantoprazole  40 mg Intravenous Q12H  . sodium chloride flush  10-40 mL Intracatheter Q12H  . sodium chloride flush  3 mL Intravenous Q12H   Infusions:  . Marland KitchenTPN (CLINIMIX-E) Adult 60 mL/hr at 04/25/16 1734   And  . fat emulsion 192 mL (04/25/16 1734)  . 0.9 % NaCl with KCl 20 mEq / L 30 mL/hr at 04/25/16 1632    Insulin Requirements: resistant SSI q4h and 35 units insulin in TPN bag; 3 units SSI from 6P- 6A, estimated 16 units of SSI in 24 hrs  Current Nutrition: NPO, drank some water  IVF: 0.45%NS with 20KCl at 30 ml/hr  Central access: PICC placed 8/2 TPN start date: 8/2  ASSESSMENT  HPI: 81 y.o.female,With history of essential hypertension, diabetes mellitus type 2, dyslipidemia, peptic ulcer disease, poor balance with multiple falls.  Admitted for dark stools which were heme positive with generalized weakness. EGD 7/31 diffuse esophagitis and a black necrotic appearing esophagus, pyloric mass causing gastric outlet obstruction. Biopsies pending. May need surgery. GI suggests TPN.  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  GI recom continuing NPO, IV PPI, and TPN. Biopsy of pylorus neg for malignancy  04/26/2016:   Glucose (goal <150): 86-144 with pt receiving 35 units of insulin in TPN bag per day; PTA glipizide & metformin on hold  Electrolytes:   Sodium slightly <goal, but trending up  Magnesium 1.6 s/p repletion for the past couple of days  Phos  2.5 (8/7), 3.3 ( 8/10), K+ wnl,   Corrected Ca WNL, albumin  low at 1.8  Renal: SCr WNL, stable  LFTs: Tbili slightly elevated on 7/30 but improved to WNL   TGs: 126 on 8/3  Prealbumin: 7.2 on 8/3, 8.8 on 8/7  NUTRITIONAL GOALS                                                                                             RD recs 8/4: 1250-1400 kcal, 70-85g protein per day RD recs 8/10: 1250-1400 kcal, 70-85g protein per day  Clinimix E 5/15 at a goal rate of 24ml/hr + 20% fat emulsion at 34ml/hr to provide: 72g/day protein, 1406Kcal/day.  PLAN                                                                                                                         - Magnesium sulfate 1 gm IV x1  At 1800 today:  Continue Clinimix E 5/15 at 60 ml/hr (goal rate) --with 35 units of insulin in TPN bag that pt will receive  20% fat emulsion at 64ml/hr  TPN to contain standard multivitamins and trace elements.  Continue  NS with 20 mEq KCL at 12ml/hr   Continue SSI resistant scale q4h.   TPN lab panels on Mondays & Thursdays.  Royetta Asal, PharmD, BCPS Pager [phone] 04/26/2016 11:51 AM

## 2016-04-26 NOTE — Progress Notes (Addendum)
[facility] Progress Note  Subjective:  Drank some water yesterday without issues but did not try any other liquids.  No complaints.  Objective:  Vital signs in last 24 hours: Temp:  [97.9 F (36.6 C)-98.6 F (37 C)] 98.2 F (36.8 C) (08/10 0431) Pulse Rate:  [72-80] 72 (08/10 0431) Resp:  [18-20] 18 (08/10 0431) BP: (133-141)/(62-77) 136/62 (08/10 0431) SpO2:  [97 %-100 %] 98 % (08/10 0431) Weight:  [176 lb 12.9 oz (80.2 kg)] 176 lb 12.9 oz (80.2 kg) (08/10 0431) Last BM Date: 04/21/16 General:  Alert, Well-developed, in NAD Heart:  Regular rate and rhythm; no murmurs Pulm:  CTAB.  No W/R/R. Abdomen:  Soft, non-distended.  BS present.  Non-tender. Extremities:  Edema in B/L UE's. Neurologic:  Alert and oriented x 4;  grossly normal neurologically. Psych:  Alert and cooperative. Normal mood and affect.   Lab Results:  Recent Labs  04/25/16 0500  WBC 4.5  HGB 9.0*  HCT 26.6*  PLT 184   BMET  Recent Labs  04/25/16 0500 04/26/16 0420  NA 132* 134*  K 4.3 3.9  CL 104 104  CO2 26 27  GLUCOSE 156* 121*  BUN 17 14  CREATININE 0.62 0.65  CALCIUM 7.7* 7.8*   LFT  Recent Labs  04/26/16 0420  PROT 4.5*  ALBUMIN 1.9*  AST 20  ALT 13*  ALKPHOS 45  BILITOT 0.6   Dg Chest 2 View  Result Date: 04/25/2016 CLINICAL DATA:  Follow-up pleural effusion EXAM: CHEST  2 VIEW COMPARISON:  04/17/2016 FINDINGS: Cardiomediastinal silhouette is stable. There is elevation of the right hemidiaphragm. Small bilateral pleural effusion. Bilateral basilar atelectasis or infiltrate. Mild degenerative changes thoracic spine. No pulmonary edema. Left arm PICC line with tip in SVC. IMPRESSION: No pulmonary edema. Small bilateral pleural effusion with bilateral basilar atelectasis or infiltrate. Left arm PICC line with tip in SVC. Electronically Signed   By: Lahoma Crocker M.D.   On: 04/25/2016 11:21   Assessment / Plan: #1  81 yo female with partial GOO secondary to pyloric  lesion and severe esophagitis with necrosis/black esophagus. Bx of pylorus did not prove malignancy; even repeat biopsy showed hyperplastic polyp. Esophageal bx with ulceration, nectrotic debri,and fungal forms. Repeat EGD 8/6 showed mild improvement of the esophagus (? Stricture development). On TNA, Diflucan, BID PPI-continue. #2 anemia- hgb had been stable but not rechecked recently #3 Right pneumonia secondary to aspiration - on Unasyn #4 AODM #5 AMS- resolved   **Per Dr. Carlean Purl: Will need to consider repeat EGD/EUS but need to wait for esophagus to heal more.  Planning to discharge her to SNF with TNA so we will likely arrange for this as outpatient in a few weeks once she is at a facility. *I have encouraged her to try more clear liquids in small amounts.  Once she is taking more liquids then will need nutritional supplements.   LOS: 11 days   ZEHR, JESSICA D.  04/26/2016, 11:09 AM  Pager number [place] Attending   I have taken an interval history, reviewed the chart and examined the patient. I agree with the Advanced Practitioner's note, impression and recommendations.     She seems afraid to push clear liquids Going to SNF tomorrow We will need to arrange for a repeat endoscopy - outpatient but likely at hospital by Dr. Havery Moros or possibly Dr. Ardis Hughs (EGD +/- EUS) in 2-3 weeks I think.  Gatha Mayer, MD, [facility] [phone] (pager) [phone] after 5 PM, weekends and holidays  04/26/2016 6:10 PM

## 2016-04-27 ENCOUNTER — Telehealth: Payer: Self-pay

## 2016-04-27 ENCOUNTER — Other Ambulatory Visit: Payer: Self-pay

## 2016-04-27 DIAGNOSIS — R9389 Abnormal findings on diagnostic imaging of other specified body structures: Secondary | ICD-10-CM

## 2016-04-27 DIAGNOSIS — R933 Abnormal findings on diagnostic imaging of other parts of digestive tract: Secondary | ICD-10-CM

## 2016-04-27 LAB — GLUCOSE, CAPILLARY
GLUCOSE-CAPILLARY: 109 mg/dL — AB (ref 65–99)
GLUCOSE-CAPILLARY: 187 mg/dL — AB (ref 65–99)

## 2016-04-27 LAB — MAGNESIUM: Magnesium: 1.6 mg/dL — ABNORMAL LOW (ref 1.7–2.4)

## 2016-04-27 MED ORDER — SODIUM CHLORIDE 0.9% FLUSH: 10.0000 mL | INTRAVENOUS | Status: AC | PRN

## 2016-04-27 MED ORDER — SODIUM CHLORIDE 0.9% FLUSH: 3.0000 mL | Freq: Two times a day (BID) | INTRAVENOUS | Status: AC

## 2016-04-27 MED ORDER — CHLORHEXIDINE GLUCONATE 0.12 % MT SOLN: 15.0000 mL | Freq: Two times a day (BID) | OROMUCOSAL | 0 refills | Status: AC

## 2016-04-27 MED ORDER — FLUCONAZOLE IN SODIUM CHLORIDE 200-0.9 MG/100ML-% IV SOLN: 200.0000 mg | INTRAVENOUS | Status: AC

## 2016-04-27 MED ORDER — BISACODYL 10 MG RE SUPP: 10.0000 mg | Freq: Every day | RECTAL | 0 refills | Status: AC | PRN

## 2016-04-27 MED ORDER — INSULIN ASPART 100 UNIT/ML ~~LOC~~ SOLN: 0.0000 [IU] | SUBCUTANEOUS | 11 refills | Status: AC

## 2016-04-27 MED ORDER — ALTEPLASE 2 MG IJ SOLR
2.0000 mg | Freq: Once | INTRAMUSCULAR | Status: AC
  Administered 2016-04-27: 2 mg
  Filled 2016-04-27: qty 2

## 2016-04-27 MED ORDER — ONDANSETRON HCL 4 MG/2ML IJ SOLN: 4.0000 mg | Freq: Four times a day (QID) | INTRAMUSCULAR | 0 refills | Status: AC | PRN

## 2016-04-27 MED ORDER — SODIUM CHLORIDE 0.9 % IV SOLN: 1.5000 g | Freq: Four times a day (QID) | INTRAVENOUS | Status: AC

## 2016-04-27 MED ORDER — PANTOPRAZOLE SODIUM 40 MG IV SOLR: 40.0000 mg | Freq: Two times a day (BID) | INTRAVENOUS | Status: AC

## 2016-04-27 MED ORDER — ALBUTEROL SULFATE (2.5 MG/3ML) 0.083% IN NEBU: 2.5000 mg | INHALATION_SOLUTION | Freq: Four times a day (QID) | RESPIRATORY_TRACT | 12 refills | Status: AC | PRN

## 2016-04-27 MED ORDER — CETYLPYRIDINIUM CHLORIDE 0.05 % MT LIQD: 7.0000 mL | Freq: Two times a day (BID) | OROMUCOSAL | 0 refills | Status: AC

## 2016-04-27 MED ORDER — HEPARIN SOD (PORK) LOCK FLUSH 100 UNIT/ML IV SOLN
250.0000 [IU] | INTRAVENOUS | Status: AC | PRN
  Administered 2016-04-27: 250 [IU]

## 2016-04-27 MED ORDER — ERYTHROMYCIN 5 MG/GM OP OINT: TOPICAL_OINTMENT | Freq: Every day | OPHTHALMIC | 0 refills | Status: AC

## 2016-04-27 MED ORDER — MAGNESIUM SULFATE 2 GM/50ML IV SOLN
2.0000 g | Freq: Once | INTRAVENOUS | Status: AC
  Administered 2016-04-27: 2 g via INTRAVENOUS
  Filled 2016-04-27: qty 50

## 2016-04-27 MED ORDER — POTASSIUM CHLORIDE IN NACL 20-0.9 MEQ/L-% IV SOLN: 30.0000 mL/h | INTRAVENOUS | Status: AC

## 2016-04-27 NOTE — Progress Notes (Signed)
PARENTERAL NUTRITION CONSULT NOTE - Follow Up  Pharmacy Consult for TPN Indication: gastric outlet obstruction  Allergies  Allergen Reactions  . Ace Inhibitors Cough  . Prednisone     REACTION: lethargic  . Sitagliptin     Other reaction(s): Unknown    Patient Measurements: Height: 5\' 2"  (157.5 cm) Weight: 178 lb 9.2 oz (81 kg) IBW/kg (Calculated) : 50.1 Adjusted Body Weight: 72.2 kg  Vital Signs: Temp: 98.7 F (37.1 C) (08/11 0408) Temp Source: Oral (08/11 0408) BP: 120/56 (08/11 0408) Pulse Rate: 71 (08/11 0408) Intake/Output from previous day: 08/10 0701 - 08/11 0700 In: 410 [P.O.:60; IV Piggyback:350] Out: [id number] [Urine:1375] Intake/Output from this shift: No intake/output data recorded.  Labs:  Recent Labs  04/25/16 0500  WBC 4.5  HGB 9.0*  HCT 26.6*  PLT 184     Recent Labs  04/25/16 0500 04/26/16 0420 04/27/16 0754  NA 132* 134*  --   K 4.3 3.9  --   CL 104 104  --   CO2 26 27  --   GLUCOSE 156* 121*  --   BUN 17 14  --   CREATININE 0.62 0.65  --   CALCIUM 7.7* 7.8*  --   MG 1.7 1.6* 1.6*  PHOS  --  3.3  --   PROT  --  4.5*  --   ALBUMIN  --  1.9*  --   AST  --  20  --   ALT  --  13*  --   ALKPHOS  --  45  --   BILITOT  --  0.6  --    Estimated Creatinine Clearance: 57.2 mL/min (by C-G formula based on SCr of 0.8 mg/dL).    Recent Labs  04/26/16 2008 04/26/16 2340 04/27/16 0404  GLUCAP 148* 127* 109*    Medical History: Past Medical History:  Diagnosis Date  . DDD (degenerative disc disease), lumbar   . Diabetes mellitus   . Diverticulosis   . Endocervical polyp   . Endometrial polyp   . GERD (gastroesophageal reflux disease)   . Hiatal hernia   . Hx of adenomatous colonic polyps   . Hyperlipidemia   . Hypertension   . Hyponatremia     Medications:  Scheduled:  . ampicillin-sulbactam (UNASYN) IV  1.5 g Intravenous Q6H  . antiseptic oral rinse  7 mL Mouth Rinse q12n4p  . bisacodyl  10 mg Rectal Daily  .  chlorhexidine  15 mL Mouth Rinse BID  . erythromycin   Both Eyes QHS  . fluconazole (DIFLUCAN) IV  200 mg Intravenous Q24H  . insulin aspart  0-20 Units Subcutaneous Q4H  . insulin aspart  14 Units Subcutaneous Once  . magnesium sulfate 1 - 4 g bolus IVPB  2 g Intravenous Once  . pantoprazole  40 mg Intravenous Q12H  . sodium chloride flush  10-40 mL Intracatheter Q12H  . sodium chloride flush  3 mL Intravenous Q12H   Infusions:  . Marland KitchenTPN (CLINIMIX-E) Adult 60 mL/hr at 04/26/16 1757   And  . fat emulsion 192 mL (04/26/16 1757)  . 0.9 % NaCl with KCl 20 mEq / L 30 mL/hr at 04/25/16 1632    Insulin Requirements: resistant SSI q4h and 35 units insulin in TPN bag; 13 units SSI in 24 hours  Current Nutrition: clear liquid diet  IVF: 0.45%NS with 20KCl at 30 ml/hr  Central access: PICC placed 8/2 TPN start date: 8/2  ASSESSMENT  HPI: 81 y.o.female,With history of essential hypertension, diabetes mellitus type 2, dyslipidemia, peptic ulcer disease, poor balance with multiple falls.  Admitted for dark stools which were heme positive with generalized weakness. EGD 7/31 diffuse esophagitis and a black necrotic appearing esophagus, pyloric mass causing gastric outlet obstruction. Biopsies pending. May need surgery. GI suggests TPN.  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  GI recom continuing NPO, IV PPI, and TPN. Biopsy of pylorus neg for malignancy 8/10: clear liquid diet  04/27/2016:   Glucose (goal <150): within goal range with pt receiving 35 units of insulin in TPN bag per day + SSI; PTA glipizide & metformin on hold  Electrolytes:   Sodium slightly <goal, but trending up  Magnesium 1.6 on 8/11  Phos wnl 8/11  Corrected Ca WNL  Renal: SCr WNL,  stable  LFTs: Tbili slightly elevated on 7/30 but improved to WNL   TGs: 126 on 8/3  Prealbumin: 7.2 on 8/3, 8.8 on 8/7  NUTRITIONAL GOALS                                                                                             RD recs 8/4: 1250-1400 kcal, 70-85g protein per day RD recs 8/10: 1250-1400 kcal, 70-85g protein per day  Clinimix E 5/15 at a goal rate of 72ml/hr + 20% fat emulsion at 70ml/hr to provide: 72g/day protein, 1406Kcal/day.  PLAN                                                                                                                         - Magnesium sulfate 2 gm IV x1  ++ plan to d/c to [facility] today (8/11) with TPN.++  At 1800 today (if pt doesn't get discharged):  Continue Clinimix E 5/15 at 60 ml/hr (goal rate) --with 35 units of insulin in TPN bag that pt will receive  20% fat emulsion at 54ml/hr  TPN to contain standard multivitamins and trace elements.  Continue NS with 20 mEq KCL at 1ml/hr   Continue SSI resistant scale q4h.   TPN lab panels on Mondays & Thursdays.  Dia Sitter, PharmD, BCPS 04/27/2016 10:46 AM

## 2016-04-27 NOTE — Telephone Encounter (Signed)
EUS has been scheduled for 04/25/2016 730 am [facility] orders in [facility]

## 2016-04-27 NOTE — Telephone Encounter (Signed)
Left message on machine to call back regarding 05/01/2016 730 am [facility] EUS.  Instructions have been mailed to the home.

## 2016-04-27 NOTE — Telephone Encounter (Signed)
Message left with [facility] endo to schedule case

## 2016-04-27 NOTE — Discharge Summary (Signed)
Physician Discharge Summary  [name] [name] [id number] DOB: 06/29/1935 DOA: 04/15/2016  PCP: [name], MD  Admit date: 04/15/2016 Discharge date: 04/27/2016  Admitted From: Home Discharge disposition: [facility]   Recommendations for Outpatient Follow-Up:   1. TNA per pharmacy. 2. Please make sure follow up GI appt established.   Discharge Diagnosis:   Principal Problem:   Acute upper GI bleed with melena and acute blood loss anemia secondary to esophageal necrosis complicated by gastric outlet obstruction secondary to gastric polyp/small bowel ulceration and fungal esophagitis Active Problems:    DM2 (diabetes mellitus, type 2) (HCC)    Melena    Fall at home    Gastric outlet obstruction    Acute esophagitis    Aspiration into airway    Gastric polyp    Upper GI bleed    GERD with stricture  Discharge Condition: Improved.  Diet recommendation: Carbohydrate-modified.  Liquids, advance as tolerated.    History of Present Illness:   [name] [name] is an 81 y.o. female with a PMH of essential hypertension, diabetes mellitus type 2, dyslipidemia, peptic ulcer disease, poor balance with multiple falls recently Who was admitted 04/15/16 for evaluation of bloody stools. In the ER patient was found to have heme positive stool, and appeared slightly weak, dehydrated and pale.   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 CT scan with no evidence of pneumotosis or pneumomediastinum related to the esophagus findings, although she  was felt to be at risk for this. Per GI, care is supportive with IVF, broad spectrum antibiotics, and IV PPI. Remains on IV Unasyn and antifungal therapy with Diflucan (with ID recommending a 28 day course of therapy). Biopsies of pylorus did notshow any malignant cells, esophageal biopsies with acute inflammation, necrotic debri and fungal forms. Continue nutritional support with TPN. Repeat EGD done 04/22/16 with biopsies also negative for malignancy. Diet advanced to clears. Plan for EGD with endoscopic ultrasound in the next few weeks. Hemoglobin stable after receiving 1 unit of PRBCs 04/21/16. Will need to D/C to SNF on TPN.  Active problems:  Hypomagnesemia Replete in TPN.  GERD with esophagitis  Continue PPI.  Generalized weakness with multiple falls.   Being followed by physical therapy with recommendations for SNF placement.  Essential hypertension Treated with hydralazine as needed.  Dehydration with hyponatremia and hypokalemia Dehydration resolved with IV fluids. Potassium WNL. Sodium still slightly low.  Mild hematemesis with right-sided aspiration pneumonitis on 04/16/2016.  Remains on Unasyn.  DM type II.  Currently being managed with insulin resistant SSI every 4 hours (and insulin in TNA). CBGs 132-188.   Medical Consultants:    Gastroenterology   Discharge Exam:   Vitals:   04/26/16 2018 04/27/16 0408  BP: (!) 128/50 (!) 120/56  Pulse: 78 71  Resp: 18 18  Temp: 98.8 F (37.1 C) 98.7 F (37.1 C)   Vitals:   04/26/16 0431 04/26/16 1336 04/26/16 2018 04/27/16 0408  BP: 136/62 130/74 (!) 128/50 (!) 120/56  Pulse: 72 79 78 71  Resp: 18 17 18 18   Temp: 98.2 F (36.8 C) 98.5 F (36.9 C) 98.8 F (37.1 C) 98.7  F (37.1 C)  TempSrc: Axillary Axillary Oral Oral  SpO2: 98% 100% 98% 98%  Weight: 80.2 kg (176 lb 12.9 oz)   81 kg (178 lb 9.2 oz)  Height:        General exam: Appears calm and comfortable. Weak appearing. Respiratory system: Clear to  auscultation. Respiratory effort normal. Cardiovascular system: S1 & S2 heard, RRR. No JVD,  rubs, gallops or clicks. No murmurs. Gastrointestinal system: Abdomen is nondistended, soft and nontender. No organomegaly or masses felt. Normal bowel sounds heard. Central nervous system: Alert and oriented. No focal neurological deficits. Extremities: No clubbing,  or cyanosis. No edema. Skin: No rashes, lesions or ulcers. Psychiatry: Judgement and insight appear normal. Mood & affect depressed.   The results of significant diagnostics from this hospitalization (including imaging, microbiology, ancillary and laboratory) are listed below for reference.     Procedures and Diagnostic Studies:   Ct Head Wo Contrast  Result Date: 04/15/2016 CLINICAL DATA:  Unwitnessed fall. Fell wall trying again out of bed. Trauma to head without loss of consciousness. Initial encounter. EXAM: CT HEAD WITHOUT CONTRAST CT CERVICAL SPINE WITHOUT CONTRAST TECHNIQUE: Multidetector CT imaging of the head and cervical spine was performed following the standard protocol without intravenous contrast. Multiplanar CT image reconstructions of the cervical spine were also generated. COMPARISON:  CT head without contrast 03/29/2016. CT of the head and face 12/24/2015. FINDINGS: CT HEAD FINDINGS Moderate atrophy and periventricular white matter hypoattenuation is stable. This is slightly advanced for age. The ventricles proportionate to the degree of atrophy. A remote infarct of the right cerebellum is stable. No acute infarct, hemorrhage, or mass lesion is present. No significant extraaxial fluid collection is present. Minimal soft tissue swelling is present in the left occipital sub scalp without an underlying fracture. The calvarium is intact. A remote right inferior orbital floor fracture has healed. The paranasal sinuses and mastoid air cells are clear. CT CERVICAL SPINE FINDINGS The cervical spine is imaged from the skullbase through  T2-3. Asymmetric degenerative changes are present at the left atlantooccipital joint. Degenerative changes are present at C1-2. There is chronic loss of disc height from C3 through C7. Vertebral body heights and alignment are maintained. Facet hypertrophy is worse on the right. Soft tissues the neck demonstrate thyroid atrophy. Atherosclerotic calcifications are present at the carotid bifurcations bilaterally without significant stenoses. A large right pleural effusion is present. The lung apices are otherwise clear. IMPRESSION: 1. Stable moderate atrophy and white matter disease. No acute intracranial abnormality. 2. Minimal soft tissue swelling in the left occipital scalp may be related to the acute trauma. There is no underlying fracture. The 3. Remote right inferior orbital floor fracture has healed. 4. Multilevel degenerative changes in the cervical spine. 5. New moderate-sized right pleural effusion. Recommend two-view chest x-ray for further evaluation. Electronically Signed   By: San Morelle M.D.   On: 04/15/2016 14:40  Ct Cervical Spine Wo Contrast  Result Date: 04/15/2016 CLINICAL DATA:  Unwitnessed fall. Fell wall trying again out of bed. Trauma to head without loss of consciousness. Initial encounter. EXAM: CT HEAD WITHOUT CONTRAST CT CERVICAL SPINE WITHOUT CONTRAST TECHNIQUE: Multidetector CT imaging of the head and cervical spine was performed following the standard protocol without intravenous contrast. Multiplanar CT image reconstructions of the cervical spine were also generated. COMPARISON:  CT head without contrast 03/29/2016. CT of the head and face 12/24/2015. FINDINGS: CT HEAD FINDINGS Moderate atrophy and periventricular white matter hypoattenuation is stable. This is slightly advanced for age.  The ventricles proportionate to the degree of atrophy. A remote infarct of the right cerebellum is stable. No acute infarct, hemorrhage, or mass lesion is present. No significant extraaxial  fluid collection is present. Minimal soft tissue swelling is present in the left occipital sub scalp without an underlying fracture. The calvarium is intact. A remote right inferior orbital floor fracture has healed. The paranasal sinuses and mastoid air cells are clear. CT CERVICAL SPINE FINDINGS The cervical spine is imaged from the skullbase through T2-3. Asymmetric degenerative changes are present at the left atlantooccipital joint. Degenerative changes are present at C1-2. There is chronic loss of disc height from C3 through C7. Vertebral body heights and alignment are maintained. Facet hypertrophy is worse on the right. Soft tissues the neck demonstrate thyroid atrophy. Atherosclerotic calcifications are present at the carotid bifurcations bilaterally without significant stenoses. A large right pleural effusion is present. The lung apices are otherwise clear. IMPRESSION: 1. Stable moderate atrophy and white matter disease. No acute intracranial abnormality. 2. Minimal soft tissue swelling in the left occipital scalp may be related to the acute trauma. There is no underlying fracture. The 3. Remote right inferior orbital floor fracture has healed. 4. Multilevel degenerative changes in the cervical spine. 5. New moderate-sized right pleural effusion. Recommend two-view chest x-ray for further evaluation. Electronically Signed   By: San Morelle M.D.   On: 04/15/2016 14:40  Dg Chest Port 1 View  Result Date: 04/16/2016 CLINICAL DATA:  Increased cough and congestion this morning. EXAM: PORTABLE CHEST 1 VIEW COMPARISON:  Chest x-rays dated 04/15/2016 and 03/29/2016. FINDINGS: Today's exam is slightly hypoinspiratory with crowding of the perihilar bronchovascular markings. New patchy airspace opacities are seen at the right lung base. Left lung remains clear. Cardiomediastinal silhouette is stable. Atherosclerotic changes noted at the aortic arch. Osseous structures about the chest are unremarkable.  IMPRESSION: Low lung volumes. New patchy airspace opacities at the right lung base, atelectasis versus early developing pneumonia. Aortic atherosclerosis. Electronically Signed   By: Franki Cabot M.D.   On: 04/16/2016 08:16  Dg Chest Port 1 View  Result Date: 04/15/2016 CLINICAL DATA:  Unwitnessed fall today.  Pleural effusion. EXAM: PORTABLE CHEST 1 VIEW COMPARISON:  03/29/2016 FINDINGS: Heart is borderline in size. Mild vascular congestion. Bibasilar atelectasis. No visible effusions or pneumothorax. No acute bony abnormality. IMPRESSION: Borderline heart size with vascular congestion. Bibasilar atelectasis. Electronically Signed   By: Rolm Baptise M.D.   On: 04/15/2016 15:19  Dg Abd Portable 1v  Result Date: 04/16/2016 CLINICAL DATA:  Melena. Ongoing heartburn. Pelvic pain since this morning. EXAM: PORTABLE ABDOMEN - 1 VIEW COMPARISON:  None FINDINGS: Moderate stool burden throughout the colon. Nonobstructive bowel gas pattern. No free air, organomegaly or suspicious calcification. Degenerative changes in the lumbar spine and hips. No acute bony abnormality. IMPRESSION: Moderate stool burden.  No acute findings. Electronically Signed   By: Rolm Baptise M.D.   On: 04/16/2016 09:30    Labs:   Basic Metabolic Panel:  Recent Labs Lab 04/21/16 0500 04/22/16 0455 04/23/16 0430 04/24/16 0335 04/25/16 0500 04/26/16 0420 04/27/16 0754  NA 133* 133* 133*  --  132* 134*  --   K 3.8 4.2 3.9  --  4.3 3.9  --   CL 106 104 104  --  104 104  --   CO2 24 25 26   --  26 27  --   GLUCOSE 203* 199* 131*  --  156* 121*  --   BUN 15 16 18   --  17 14  --   CREATININE 0.69 0.71 0.66  --  0.62 0.65  --   CALCIUM 7.4* 7.5* 7.6*  --  7.7* 7.8*  --   MG 1.6* 1.8 1.6* 1.7 1.7 1.6* 1.6*  PHOS 2.6  --  2.5  --   --  3.3  --    GFR Estimated Creatinine Clearance: 57.2 mL/min (by C-G formula based on SCr of 0.8 mg/dL). Liver Function Tests:  Recent Labs Lab 04/23/16 0430 04/26/16 0420  AST 19 20  ALT  13* 13*  ALKPHOS 41 45  BILITOT 0.5 0.6  PROT 4.6* 4.5*  ALBUMIN 1.9* 1.9*    CBC:  Recent Labs Lab 04/21/16 0500 04/21/16 2022 04/23/16 0430 04/25/16 0500  WBC 6.3  --  6.0 4.5  NEUTROABS  --   --  4.0  --   HGB 7.3* 9.1* 9.1* 9.0*  HCT 22.0* 28.2* 27.2* 26.6*  MCV 88.0  --  87.5 87.8  PLT 198  --  174 184   CBG:  Recent Labs Lab 04/26/16 1213 04/26/16 1632 04/26/16 2008 04/26/16 2340 04/27/16 0404  GLUCAP 116* 166* 148* 127* 109*   Microbiology Recent Results (from the past 240 hour(s))  MRSA PCR Screening     Status: None   Collection Time: 04/17/16  6:27 PM  Result Value Ref Range Status   MRSA by PCR NEGATIVE NEGATIVE Final    Comment:        The GeneXpert MRSA Assay (FDA approved for NASAL specimens only), is one component of a comprehensive MRSA colonization surveillance program. It is not intended to diagnose MRSA infection nor to guide or monitor treatment for MRSA infections.   MRSA PCR Screening     Status: None   Collection Time: 04/21/16 12:31 PM  Result Value Ref Range Status   MRSA by PCR NEGATIVE NEGATIVE Final    Comment:        The GeneXpert MRSA Assay (FDA approved for NASAL specimens only), is one component of a comprehensive MRSA colonization surveillance program. It is not intended to diagnose MRSA infection nor to guide or monitor treatment for MRSA infections.      Discharge Instructions:   Discharge Instructions    Call MD for:  persistant nausea and vomiting    Complete by:  As directed   Call MD for:  severe uncontrolled pain    Complete by:  As directed   Call MD for:  temperature >100.4    Complete by:  As directed   Diet Carb Modified    Complete by:  As directed   Liquid diet, advance slowly as tolerated.   Increase activity slowly    Complete by:  As directed       Medication List    STOP taking these medications   aspirin 81 MG tablet   aspirin-sod bicarb-citric acid 325 MG Tbef tablet Commonly  known as:  ALKA-SELTZER   famotidine 20 MG tablet Commonly known as:  PEPCID   glipiZIDE 10 MG 24 hr tablet Commonly known as:  GLUCOTROL XL   losartan 100 MG tablet Commonly known as:  COZAAR   metFORMIN 1000 MG tablet Commonly known as:  GLUCOPHAGE   ondansetron 4 MG tablet Commonly known as:  ZOFRAN Replaced by:  ondansetron 4 MG/2ML Soln injection   sucralfate 1 g tablet Commonly known as:  CARAFATE     TAKE these medications   0.9 % NaCl with KCl 20 mEq / L 20-0.9 MEQ/L-% Inject 30  mL/hr into the vein continuous.   acetaminophen 325 MG tablet Commonly known as:  TYLENOL Take 325 mg by mouth every 6 (six) hours as needed for moderate pain or headache.   albuterol (2.5 MG/3ML) 0.083% nebulizer solution Commonly known as:  PROVENTIL Take 3 mLs (2.5 mg total) by nebulization every 6 (six) hours as needed for wheezing or shortness of breath.   ampicillin-sulbactam 1.5 g in sodium chloride 0.9 % 50 mL Inject 1.5 g into the vein every 6 (six) hours.   antiseptic oral rinse 0.05 % Liqd solution Commonly known as:  CPC / CETYLPYRIDINIUM CHLORIDE 0.05% 7 mLs by Mouth Rinse route 2 times daily at 12 noon and 4 pm.   bisacodyl 10 MG suppository Commonly known as:  DULCOLAX Place 1 suppository (10 mg total) rectally daily as needed for moderate constipation.   chlorhexidine 0.12 % solution Commonly known as:  PERIDEX 15 mLs by Mouth Rinse route 2 (two) times daily.   erythromycin ophthalmic ointment Place into both eyes at bedtime. What changed:  how to take this  when to take this  additional instructions   fluconazole 200-0.9 MG/100ML-% IVPB Commonly known as:  DIFLUCAN Inject 100 mLs (200 mg total) into the vein daily.   insulin aspart 100 UNIT/ML injection Commonly known as:  novoLOG Inject 0-20 Units into the skin every 4 (four) hours.   ondansetron 4 MG/2ML Soln injection Commonly known as:  ZOFRAN Inject 2 mLs (4 mg total) into the vein every 6  (six) hours as needed for nausea. Replaces:  ondansetron 4 MG tablet   pantoprazole 40 MG injection Commonly known as:  PROTONIX Inject 40 mg into the vein every 12 (twelve) hours.   sodium chloride flush 0.9 % Soln Commonly known as:  NS 10-40 mLs by Intracatheter route as needed (flush).   sodium chloride flush 0.9 % Soln Commonly known as:  NS Inject 3 mLs into the vein every 12 (twelve) hours.       Contact information for follow-up providers    Silvano Rusk, MD .   Specialty:  Gastroenterology Why:  Office will arrange follow up appt time. Contact information: [address] [place] [address] [phone]            Contact information for after-discharge care    [facility] .   Specialty:  [place] information: [address] [place] [phone]                   Time coordinating discharge: 35 minutes.  Signed:  Lillias Difrancesco  Pager [phone] [facility] 04/27/2016, 11:01 AM

## 2016-04-27 NOTE — Care Management Note (Signed)
Case Management Note  Patient Details  Name: [name] [name] MRN: [id number] Date of Birth: 1935/02/23  Subjective/Objective:                    Action/Plan:d/c SNF.   Expected Discharge Date:                  Expected Discharge Plan:  Skilled Nursing Facility  In-House Referral:  Clinical Social Work  Discharge planning Services  CM Consult  Post Acute Care Choice:    Choice offered to:     DME Arranged:    DME Agency:     HH Arranged:    [place] Agency:     Status of Service:  Completed, signed off  If discussed at [employer], dates discussed:    Additional Comments:  Dessa Phi, RN 04/27/2016, 11:47 AM

## 2016-04-27 NOTE — Clinical Social Work Placement (Signed)
Patient is set to discharge to [facility] today. Patient & daughter, [name] [name] aware. Discharge packet given to RN, Isabell Jarvis called for transport.     Raynaldo Opitz, [facility] Clinical Social Worker cell #: [phone]   CLINICAL SOCIAL WORK PLACEMENT  NOTE  Date:  04/27/2016  Patient Details  Name: [name] [name] MRN: [id number] Date of Birth: 04-Apr-1935  Clinical Social Work is seeking post-discharge placement for this patient at the [place] level of care (*CSW will initial, date and re-position this form in  chart as items are completed):  Yes   Patient/family provided with [place] Work Department's list of facilities offering this level of care within the geographic area requested by the patient (or if unable, by the patient's family).  Yes   Patient/family informed of their freedom to choose among providers that offer the needed level of care, that participate in Medicare, Medicaid or managed care program needed by the patient, have an available bed and are willing to accept the patient.  Yes   Patient/family informed of [facility]'s ownership interest in [facility] and [facility], as well as of the fact that they are under no obligation to receive care at these facilities.  PASRR submitted to EDS on 04/25/16     PASRR number received on 04/25/16     Existing PASRR number confirmed on       FL2 transmitted to all facilities in geographic area requested by pt/family on 04/25/16     FL2 transmitted to all facilities within larger geographic area on       Patient informed that his/her managed care company has contracts with or will negotiate with certain facilities, including the following:        Yes   Patient/family informed of bed offers received.  Patient chooses bed at [facility]     Physician recommends and patient chooses bed at      Patient to be transferred to  [facility] on 04/27/16.  Patient to be transferred to facility by [facility]     Patient family notified on 04/27/16 of transfer.  Name of family member notified:  patient's daughter, [name] [name] via phone     PHYSICIAN       Additional Comment:    _______________________________________________ Standley Brooking, LCSW 04/27/2016, 2:25 PM

## 2016-04-27 NOTE — Progress Notes (Signed)
Nutrition Follow-up  DOCUMENTATION CODES:   Not applicable  INTERVENTION:  - Continue TPN per pharmacy following d/c to [facility] today. - Diet advancement per MD for order at facility. - RD will follow-up 8/14 if pt unable to d/c today.  NUTRITION DIAGNOSIS:   Inadequate oral intake related to inability to eat as evidenced by NPO status. -resolving with advancement to CLD.   GOAL:   Patient will meet greater than or equal to 90% of their needs -met  MONITOR:   PO intake, Diet advancement, Weight trends, Labs, Skin, I & O's, Other (Comment) (TPN regimen)  ASSESSMENT:   [name] [name]  is a 81 y.o. female, With history of essential hypertension, diabetes mellitus type 2, dyslipidemia, peptic ulcer disease, poor balance with multiple falls recently started using a walker still lives at home whose been experiencing some dark stools for the last few days along with generalized weakness.  8/11 Diet advancement to CLD on 8/9 AM and order in place for Carb Modified at midnight tonight; likely diet order for [facility] as d/c order and summary for d/c to this facility tin place at this time. Pt to continue on TPN following d/c. Pt currently receiving goal rate TPN: Clinimix E 5/15 @ 60 mL/hr with 20% lipids @ 8 mL/hr with 35 units of insulin/bag. This is providing 72 grams of protein, 1406 kcal to meet 100% estimated nutrition needs.  Pt reports that this AM she had coffee, water, broth, and "another item I forget." She states she has some throat pain when swallowing that is not present without swallowing. Pt also reports some slight abdominal pressure with PO intakes.   If pt unable to d/c today, RD will follow-up Monday to monitor TPN regimen and diet advancement. Per chart review, pt now +9.8L since admission.   Medications reviewed; sliding scale Novolog, 1 g IV Mg sulfate x1 dose yesterday, 2 g IV Mg sulfate x1 dose today, 40 mg IV Protonix BID. Labs reviewed; CBG: 109  mg/dL this AM, Na: 134 mmol/L, Ca: 7.8 mg/dL, Mg: 1.6 mg/dL. IVF: NS-20 mEq KCl @ 30 mL/hr.   8/9 - Per chart review, pt +9.3L since admission (+8.7 kg).  - Pt remains NPO since 04/17/16 and is currently receiving TPN at goal rate: Clinimix E 5/15 @ 60 mL/hr with 20% lipids @ 8 mL/hr and 35 units of insulin/bag.  - This regimen is providing 72 grams of protein, 1406 kcal which meets 100% estimated nutrition needs. This provides 1440 mL fluid/24 hours.  - GI saw pt earlier this AM and talked with pt about advancement to CLD at that time; pt agreeable to trying sips of water at that time.   IVF: 1/2 NS-20 mEq KCl @ 30 mL/hr (720 mL fluid/24 hours).   8/7 - Pt's weight now trending back toward admission weight (+3.1 kg since admission).  - Pt remains NPO since previous assessment.  - She underwent repeat EGD yesterday AM with findings of:  3 cm hiatal hernia. 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 Non-bleeding gastric ulcer with no stigmata of bleeding. Gastric stenosis was found at the pylorus due to a gastric mass - this does not appear consistent with hyperplastic polyp as initially reported but possible. Additional biopsies obtained to rule out malignancy. Multiple non-bleeding duodenal ulcers with no stigmata of bleeding, improved from previous exam.  Pt currently receiving goal rate of TPN: Clinimix E 5/15 @  60 mL/hr with 20% lipids @ 8 mL/hr. This regimen is providing 72 grams of protein, 1406 kcal to meet 100% estimated protein and kcal needs. Per pharmacy, plan to continue this order at this time.   IVF: 1/2 NS-20 mEq KCl @ 30 mL/hr.     Diet Order:  Diet clear liquid Room service appropriate? Yes; Fluid consistency: Thin .TPN (CLINIMIX-E) Adult Diet Carb Modified  Skin:  Reviewed, no issues (L arm wound)  Last BM:  8/9  Height:   Ht Readings from Last 1  Encounters:  04/19/16 '5\' 2"'$  (1.575 m)    Weight:   Wt Readings from Last 1 Encounters:  04/27/16 178 lb 9.2 oz (81 kg)    Ideal Body Weight:  50 kg  BMI:  Body mass index is 32.66 kg/m.  Estimated Nutritional Needs:   Kcal:  1250-1400 calories  Protein:  70-85 grams  Fluid:  >/= 1.25L  EDUCATION NEEDS:   No education needs identified at this time    Jarome Matin, MS, RD, LDN Inpatient Clinical Dietitian Pager # [phone] After hours/weekend pager # [phone]

## 2016-04-27 NOTE — Telephone Encounter (Signed)
-----   Message from Milus Banister, MD sent at 04/27/2016  9:01 AM EDT ----- Regarding: RE: Pt needs EGD=/-EUS in 2-3 weeks per Dr. Carlean Purl [place].  Let's put her on for upper EUS on August 31st.  Irvin Lizama, This woman is currently in [place]. She needs upper EUS, radial +/- linear in 3 weeks (August 31st), ++MAC, for abnormal stomach, abnormal esophagus.  Thanks  ----- Message ----- From: Levin Erp, PA Sent: 04/27/2016   8:53 AM To: Milus Banister, MD Subject: Pt needs EGD=/-EUS in 2-3 weeks per Dr. Sharlyne Pacas  Good morning,   This pt is currently in the hospital, originally Dr. Arm pt, but Dr. Darnell Level believes would benefit from EGD +/- EUS for further eval of antrum.  Could you take a look at the case and let me know if we can schedule her so I can go ahead and put in a note letting everyone know the plans before she is d/c today? Thanks! JLL

## 2016-04-27 NOTE — Care Management (Signed)
Pt has been scheduled for EGD/EUS on August 31st with Dr. Ardis Hughs.She will receive further communication from our clinic regarding this. JLL-PA-C

## 2016-04-27 NOTE — Care Management (Signed)
Arranging for outpatient EGD+/- EUS in 2-3 weeks with Dr. Ardis Hughs, our office will contact patient in regards to when this is scheduled. -Ellouise Newer, PA-C [facility]

## 2016-04-27 NOTE — Discharge Instructions (Signed)
Esophagitis °Esophagitis is inflammation of the esophagus. The esophagus is the tube that carries food and liquids from your mouth to your stomach. Esophagitis can cause soreness or pain in the esophagus. This condition can make it difficult and painful to swallow.  °CAUSES °Most causes of esophagitis are not serious. Common causes of this condition include: °· Gastroesophageal reflux disease (GERD). This is when stomach contents move back up into the esophagus (reflux). °· Repeated vomiting. °· An allergic-type reaction, especially caused by food allergies (eosinophilic esophagitis). °· Injury to the esophagus by swallowing large pills with or without water, or swallowing certain types of medicines. °· Swallowing (ingesting) harmful chemicals, such as household cleaning products. °· Heavy alcohol use. °· An infection of the esophagus. This most often occurs in people who have a weakened immune system. °· Radiation or chemotherapy treatment for cancer. °· Certain diseases such as sarcoidosis, Crohn disease, and scleroderma. °SYMPTOMS °Symptoms of this condition include: °· Difficult or painful swallowing. °· Pain with swallowing acidic liquids, such as citrus juices. °· Pain with burping. °· Chest pain. °· Difficulty breathing. °· Nausea. °· Vomiting. °· Pain in the abdomen. °· Weight loss. °· Ulcers in the mouth. °· Patches of white material in the mouth (candidiasis). °· Fever. °· Coughing up blood or vomiting blood. °· Stool that is black, tarry, or bright red. °DIAGNOSIS °Your health care provider will take a medical history and perform a physical exam. You may also have other tests, including: °· An endoscopy to examine your stomach and esophagus with a small camera. °· A test that measures the acidity level in your esophagus. °· A test that measures how much pressure is on your esophagus. °· A barium swallow or modified barium swallow to show the shape, size, and functioning of your esophagus. °· Allergy  tests. °TREATMENT °Treatment for this condition depends on the cause of your esophagitis. In some cases, steroids or other medicines may be given to help relieve your symptoms or to treat the underlying cause of your condition. You may have to make some lifestyle changes, such as: °· Avoiding alcohol. °· Quitting smoking. °· Changing your diet. °· Exercising. °· Changing your sleep habits and your sleep environment. °HOME CARE INSTRUCTIONS °Take these actions to decrease your discomfort and to help avoid complications. °Diet °· Follow a diet as recommended by your health care provider. This may involve avoiding foods and drinks such as: °¨ Coffee and tea (with or without caffeine). °¨ Drinks that contain alcohol. °¨ Energy drinks and sports drinks. °¨ Carbonated drinks or sodas. °¨ Chocolate and cocoa. °¨ Peppermint and mint flavorings. °¨ Garlic and onions. °¨ Horseradish. °¨ Spicy and acidic foods, including peppers, chili powder, curry powder, vinegar, hot sauces, and barbecue sauce. °¨ Citrus fruit juices and citrus fruits, such as oranges, lemons, and limes. °¨ Tomato-based foods, such as red sauce, chili, salsa, and pizza with red sauce. °¨ Fried and fatty foods, such as donuts, french fries, potato chips, and high-fat dressings. °¨ High-fat meats, such as hot dogs and fatty cuts of red and white meats, such as rib eye steak, sausage, ham, and bacon. °¨ High-fat dairy items, such as whole milk, butter, and cream cheese. °· Eat small, frequent meals instead of large meals. °· Avoid drinking large amounts of liquid with your meals. °· Avoid eating meals during the 2-3 hours before bedtime. °· Avoid lying down right after you eat. °· Do not exercise right after you eat. °· Avoid foods and drinks that seem to   make your symptoms worse. °General Instructions °· Pay attention to any changes in your symptoms. °· Take over-the-counter and prescription medicines only as told by your health care provider. Do not take  aspirin, ibuprofen, or other NSAIDs unless your health care provider told you to do so. °· If you have trouble taking pills, use a pill splitter to decrease the size of the pill. This will decrease the chance of the pill getting stuck or injuring your esophagus on the way down. Also, drink water after you take a pill. °· Do not use any tobacco products, including cigarettes, chewing tobacco, and e-cigarettes. If you need help quitting, ask your health care provider. °· Wear loose-fitting clothing. Do not wear anything tight around your waist that causes pressure on your abdomen. °· Raise (elevate) the head of your bed about 6 inches (15 cm). °· Try to reduce your stress, such as with yoga or meditation. If you need help reducing stress, ask your health care provider. °· If you are overweight, reduce your weight to an amount that is healthy for you. Ask your health care provider for guidance about a safe weight loss goal. °· Keep all follow-up visits as told by your health care provider. This is important. °SEEK MEDICAL CARE IF: °· You have new symptoms. °· You have unexplained weight loss. °· You have difficulty swallowing, or it hurts to swallow. °· You have wheezing or a persistent cough. °· Your symptoms do not improve with treatment. °· You have frequent heartburn for more than two weeks. °SEEK IMMEDIATE MEDICAL CARE IF: °· You have severe pain in your arms, neck, jaw, teeth, or back. °· You feel sweaty, dizzy, or light-headed. °· You have chest pain or shortness of breath. °· You vomit and your vomit looks like blood or coffee grounds. °· Your stool is bloody or black. °· You have a fever. °· You cannot swallow, drink, or eat. °  °This information is not intended to replace advice given to you by your health care provider. Make sure you discuss any questions you have with your health care provider. °  °Document Released: 10/11/2004 Document Revised: 05/25/2015 Document Reviewed: 12/29/2014 °[employer]vier Interactive  Patient Education ©2016 [employer] ° °

## 2016-05-01 NOTE — Telephone Encounter (Signed)
Left message on machine to call back  

## 2016-05-02 NOTE — Telephone Encounter (Signed)
Left message on machine to call back, saw in the hospital discharge that the pt was going to SNF.    Called the daughter to find out where she is going.    Left message on machine to call back after reviewing the chart per the social worker notes the pt was placed at   [facility] and [place], [place] [phone]  I called and Rober Minion states she is not at that facility.   Upon further research the pt is at     [facility] .   Specialty:  Skilled Nursing Facility Contact information: [address] [place] [phone]  I have placed a call to [facility] care and asked to speak with the pt's nurse.    Rodena Piety, RN was advised of the appt and instructions have been faxed to her at [phone] Attn:  [facility]

## 2016-05-15 ENCOUNTER — Inpatient Hospital Stay (HOSPITAL_COMMUNITY)
Admission: EM | Admit: 2016-05-15 | Discharge: 2016-06-17 | DRG: 314 | Disposition: E | Payer: Medicare Other | Attending: Internal Medicine | Admitting: Internal Medicine

## 2016-05-15 ENCOUNTER — Emergency Department (HOSPITAL_COMMUNITY): Payer: Medicare Other

## 2016-05-15 DIAGNOSIS — D649 Anemia, unspecified: Secondary | ICD-10-CM | POA: Diagnosis present

## 2016-05-15 DIAGNOSIS — T827XXA Infection and inflammatory reaction due to other cardiac and vascular devices, implants and grafts, initial encounter: Secondary | ICD-10-CM | POA: Diagnosis not present

## 2016-05-15 DIAGNOSIS — D709 Neutropenia, unspecified: Secondary | ICD-10-CM | POA: Diagnosis not present

## 2016-05-15 DIAGNOSIS — T17908A Unspecified foreign body in respiratory tract, part unspecified causing other injury, initial encounter: Secondary | ICD-10-CM

## 2016-05-15 DIAGNOSIS — K2289 Other specified disease of esophagus: Secondary | ICD-10-CM

## 2016-05-15 DIAGNOSIS — A419 Sepsis, unspecified organism: Secondary | ICD-10-CM | POA: Diagnosis present

## 2016-05-15 DIAGNOSIS — N390 Urinary tract infection, site not specified: Secondary | ICD-10-CM | POA: Diagnosis not present

## 2016-05-15 DIAGNOSIS — J9811 Atelectasis: Secondary | ICD-10-CM | POA: Diagnosis not present

## 2016-05-15 DIAGNOSIS — B9620 Unspecified Escherichia coli [E. coli] as the cause of diseases classified elsewhere: Secondary | ICD-10-CM | POA: Diagnosis not present

## 2016-05-15 DIAGNOSIS — Z993 Dependence on wheelchair: Secondary | ICD-10-CM

## 2016-05-15 DIAGNOSIS — Y712 Prosthetic and other implants, materials and accessory cardiovascular devices associated with adverse incidents: Secondary | ICD-10-CM | POA: Diagnosis not present

## 2016-05-15 DIAGNOSIS — Z01810 Encounter for preprocedural cardiovascular examination: Secondary | ICD-10-CM | POA: Diagnosis not present

## 2016-05-15 DIAGNOSIS — T80211A Bloodstream infection due to central venous catheter, initial encounter: Principal | ICD-10-CM | POA: Diagnosis present

## 2016-05-15 DIAGNOSIS — T501X5A Adverse effect of loop [high-ceiling] diuretics, initial encounter: Secondary | ICD-10-CM | POA: Diagnosis not present

## 2016-05-15 DIAGNOSIS — R509 Fever, unspecified: Secondary | ICD-10-CM

## 2016-05-15 DIAGNOSIS — K635 Polyp of colon: Secondary | ICD-10-CM | POA: Diagnosis not present

## 2016-05-15 DIAGNOSIS — I959 Hypotension, unspecified: Secondary | ICD-10-CM | POA: Diagnosis present

## 2016-05-15 DIAGNOSIS — G9341 Metabolic encephalopathy: Secondary | ICD-10-CM | POA: Diagnosis present

## 2016-05-15 DIAGNOSIS — M6281 Muscle weakness (generalized): Secondary | ICD-10-CM

## 2016-05-15 DIAGNOSIS — R0602 Shortness of breath: Secondary | ICD-10-CM

## 2016-05-15 DIAGNOSIS — Z8709 Personal history of other diseases of the respiratory system: Secondary | ICD-10-CM

## 2016-05-15 DIAGNOSIS — J9601 Acute respiratory failure with hypoxia: Secondary | ICD-10-CM | POA: Diagnosis present

## 2016-05-15 DIAGNOSIS — M7989 Other specified soft tissue disorders: Secondary | ICD-10-CM | POA: Diagnosis not present

## 2016-05-15 DIAGNOSIS — E43 Unspecified severe protein-calorie malnutrition: Secondary | ICD-10-CM | POA: Diagnosis present

## 2016-05-15 DIAGNOSIS — B961 Klebsiella pneumoniae [K. pneumoniae] as the cause of diseases classified elsewhere: Secondary | ICD-10-CM | POA: Diagnosis not present

## 2016-05-15 DIAGNOSIS — I5030 Unspecified diastolic (congestive) heart failure: Secondary | ICD-10-CM | POA: Diagnosis present

## 2016-05-15 DIAGNOSIS — I5032 Chronic diastolic (congestive) heart failure: Secondary | ICD-10-CM | POA: Diagnosis present

## 2016-05-15 DIAGNOSIS — K3189 Other diseases of stomach and duodenum: Secondary | ICD-10-CM | POA: Diagnosis present

## 2016-05-15 DIAGNOSIS — J690 Pneumonitis due to inhalation of food and vomit: Secondary | ICD-10-CM | POA: Diagnosis present

## 2016-05-15 DIAGNOSIS — R7881 Bacteremia: Secondary | ICD-10-CM | POA: Diagnosis not present

## 2016-05-15 DIAGNOSIS — J9600 Acute respiratory failure, unspecified whether with hypoxia or hypercapnia: Secondary | ICD-10-CM | POA: Diagnosis present

## 2016-05-15 DIAGNOSIS — T80212A Local infection due to central venous catheter, initial encounter: Secondary | ICD-10-CM | POA: Diagnosis not present

## 2016-05-15 DIAGNOSIS — K5900 Constipation, unspecified: Secondary | ICD-10-CM | POA: Diagnosis not present

## 2016-05-15 DIAGNOSIS — Z515 Encounter for palliative care: Secondary | ICD-10-CM

## 2016-05-15 DIAGNOSIS — Z6831 Body mass index (BMI) 31.0-31.9, adult: Secondary | ICD-10-CM

## 2016-05-15 DIAGNOSIS — E785 Hyperlipidemia, unspecified: Secondary | ICD-10-CM | POA: Diagnosis present

## 2016-05-15 DIAGNOSIS — Z7189 Other specified counseling: Secondary | ICD-10-CM | POA: Diagnosis not present

## 2016-05-15 DIAGNOSIS — K228 Other specified diseases of esophagus: Secondary | ICD-10-CM | POA: Diagnosis not present

## 2016-05-15 DIAGNOSIS — G9340 Encephalopathy, unspecified: Secondary | ICD-10-CM | POA: Diagnosis present

## 2016-05-15 DIAGNOSIS — Z8249 Family history of ischemic heart disease and other diseases of the circulatory system: Secondary | ICD-10-CM

## 2016-05-15 DIAGNOSIS — Z7401 Bed confinement status: Secondary | ICD-10-CM

## 2016-05-15 DIAGNOSIS — E876 Hypokalemia: Secondary | ICD-10-CM | POA: Diagnosis present

## 2016-05-15 DIAGNOSIS — B377 Candidal sepsis: Secondary | ICD-10-CM | POA: Diagnosis present

## 2016-05-15 DIAGNOSIS — R0600 Dyspnea, unspecified: Secondary | ICD-10-CM

## 2016-05-15 DIAGNOSIS — B49 Unspecified mycosis: Secondary | ICD-10-CM | POA: Diagnosis present

## 2016-05-15 DIAGNOSIS — E119 Type 2 diabetes mellitus without complications: Secondary | ICD-10-CM

## 2016-05-15 DIAGNOSIS — A498 Other bacterial infections of unspecified site: Secondary | ICD-10-CM | POA: Diagnosis present

## 2016-05-15 DIAGNOSIS — R410 Disorientation, unspecified: Secondary | ICD-10-CM | POA: Diagnosis present

## 2016-05-15 DIAGNOSIS — K2210 Ulcer of esophagus without bleeding: Secondary | ICD-10-CM | POA: Diagnosis present

## 2016-05-15 DIAGNOSIS — Z833 Family history of diabetes mellitus: Secondary | ICD-10-CM

## 2016-05-15 DIAGNOSIS — F411 Generalized anxiety disorder: Secondary | ICD-10-CM | POA: Diagnosis not present

## 2016-05-15 DIAGNOSIS — Y848 Other medical procedures as the cause of abnormal reaction of the patient, or of later complication, without mention of misadventure at the time of the procedure: Secondary | ICD-10-CM | POA: Diagnosis present

## 2016-05-15 DIAGNOSIS — Z95828 Presence of other vascular implants and grafts: Secondary | ICD-10-CM | POA: Diagnosis not present

## 2016-05-15 DIAGNOSIS — E871 Hypo-osmolality and hyponatremia: Secondary | ICD-10-CM | POA: Diagnosis present

## 2016-05-15 DIAGNOSIS — B9689 Other specified bacterial agents as the cause of diseases classified elsewhere: Secondary | ICD-10-CM | POA: Diagnosis not present

## 2016-05-15 DIAGNOSIS — Z888 Allergy status to other drugs, medicaments and biological substances status: Secondary | ICD-10-CM

## 2016-05-15 DIAGNOSIS — D696 Thrombocytopenia, unspecified: Secondary | ICD-10-CM | POA: Diagnosis present

## 2016-05-15 DIAGNOSIS — R1110 Vomiting, unspecified: Secondary | ICD-10-CM | POA: Diagnosis not present

## 2016-05-15 DIAGNOSIS — Z789 Other specified health status: Secondary | ICD-10-CM | POA: Diagnosis not present

## 2016-05-15 DIAGNOSIS — E669 Obesity, unspecified: Secondary | ICD-10-CM | POA: Diagnosis present

## 2016-05-15 DIAGNOSIS — Z803 Family history of malignant neoplasm of breast: Secondary | ICD-10-CM

## 2016-05-15 DIAGNOSIS — I10 Essential (primary) hypertension: Secondary | ICD-10-CM | POA: Diagnosis present

## 2016-05-15 DIAGNOSIS — K210 Gastro-esophageal reflux disease with esophagitis: Secondary | ICD-10-CM | POA: Diagnosis present

## 2016-05-15 DIAGNOSIS — Z66 Do not resuscitate: Secondary | ICD-10-CM | POA: Diagnosis present

## 2016-05-15 DIAGNOSIS — Z9049 Acquired absence of other specified parts of digestive tract: Secondary | ICD-10-CM

## 2016-05-15 DIAGNOSIS — E222 Syndrome of inappropriate secretion of antidiuretic hormone: Secondary | ICD-10-CM | POA: Diagnosis not present

## 2016-05-15 DIAGNOSIS — B379 Candidiasis, unspecified: Secondary | ICD-10-CM | POA: Diagnosis not present

## 2016-05-15 DIAGNOSIS — D490 Neoplasm of unspecified behavior of digestive system: Secondary | ICD-10-CM | POA: Diagnosis present

## 2016-05-15 DIAGNOSIS — Z86010 Personal history of colonic polyps: Secondary | ICD-10-CM

## 2016-05-15 DIAGNOSIS — I4581 Long QT syndrome: Secondary | ICD-10-CM | POA: Diagnosis present

## 2016-05-15 DIAGNOSIS — Z978 Presence of other specified devices: Secondary | ICD-10-CM | POA: Diagnosis not present

## 2016-05-15 DIAGNOSIS — K319 Disease of stomach and duodenum, unspecified: Secondary | ICD-10-CM | POA: Diagnosis not present

## 2016-05-15 DIAGNOSIS — R0902 Hypoxemia: Secondary | ICD-10-CM

## 2016-05-15 DIAGNOSIS — E1100 Type 2 diabetes mellitus with hyperosmolarity without nonketotic hyperglycemic-hyperosmolar coma (NKHHC): Secondary | ICD-10-CM | POA: Diagnosis not present

## 2016-05-15 DIAGNOSIS — Z452 Encounter for adjustment and management of vascular access device: Secondary | ICD-10-CM

## 2016-05-15 DIAGNOSIS — K311 Adult hypertrophic pyloric stenosis: Secondary | ICD-10-CM

## 2016-05-15 DIAGNOSIS — Y718 Miscellaneous cardiovascular devices associated with adverse incidents, not elsewhere classified: Secondary | ICD-10-CM | POA: Diagnosis not present

## 2016-05-15 DIAGNOSIS — I110 Hypertensive heart disease with heart failure: Secondary | ICD-10-CM | POA: Diagnosis present

## 2016-05-15 DIAGNOSIS — R296 Repeated falls: Secondary | ICD-10-CM | POA: Diagnosis present

## 2016-05-15 DIAGNOSIS — C169 Malignant neoplasm of stomach, unspecified: Secondary | ICD-10-CM | POA: Diagnosis not present

## 2016-05-15 DIAGNOSIS — Z794 Long term (current) use of insulin: Secondary | ICD-10-CM

## 2016-05-15 DIAGNOSIS — Z801 Family history of malignant neoplasm of trachea, bronchus and lung: Secondary | ICD-10-CM

## 2016-05-15 DIAGNOSIS — K208 Other esophagitis: Secondary | ICD-10-CM | POA: Diagnosis not present

## 2016-05-15 LAB — CBC WITH DIFFERENTIAL/PLATELET
BASOS PCT: 0 %
Basophils Absolute: 0 10*3/uL (ref 0.0–0.1)
EOS ABS: 0 10*3/uL (ref 0.0–0.7)
EOS PCT: 0 %
HEMATOCRIT: 26.8 % — AB (ref 36.0–46.0)
Hemoglobin: 8.4 g/dL — ABNORMAL LOW (ref 12.0–15.0)
LYMPHS PCT: 7 %
Lymphs Abs: 0.6 10*3/uL — ABNORMAL LOW (ref 0.7–4.0)
MCH: 26.8 pg (ref 26.0–34.0)
MCHC: 31.3 g/dL (ref 30.0–36.0)
MCV: 85.6 fL (ref 78.0–100.0)
MONO ABS: 0.3 10*3/uL (ref 0.1–1.0)
MONOS PCT: 4 %
NEUTROS ABS: 7.8 10*3/uL — AB (ref 1.7–7.7)
Neutrophils Relative %: 89 %
PLATELETS: 179 10*3/uL (ref 150–400)
RBC: 3.13 MIL/uL — ABNORMAL LOW (ref 3.87–5.11)
RDW: 14.7 % (ref 11.5–15.5)
WBC: 8.7 10*3/uL (ref 4.0–10.5)

## 2016-05-15 LAB — BASIC METABOLIC PANEL
ANION GAP: 7 (ref 5–15)
BUN: 23 mg/dL — AB (ref 6–20)
CHLORIDE: 95 mmol/L — AB (ref 101–111)
CO2: 30 mmol/L (ref 22–32)
Calcium: 7.2 mg/dL — ABNORMAL LOW (ref 8.9–10.3)
Creatinine, Ser: 0.7 mg/dL (ref 0.44–1.00)
GFR calc Af Amer: >60 mL/min (ref >60)
GFR calc non Af Amer: >60 mL/min (ref >60)
Glucose, Bld: 170 mg/dL — ABNORMAL HIGH (ref 65–99)
POTASSIUM: 2.6 mmol/L — AB (ref 3.5–5.1)
SODIUM: 132 mmol/L — AB (ref 135–145)

## 2016-05-15 LAB — TROPONIN I: Troponin I: 0.06 ng/mL

## 2016-05-15 LAB — COMPREHENSIVE METABOLIC PANEL
ALBUMIN: 1.8 g/dL — AB (ref 3.5–5.0)
ALT: 26 U/L (ref 14–54)
ANION GAP: 9 (ref 5–15)
AST: 40 U/L (ref 15–41)
Alkaline Phosphatase: 73 U/L (ref 38–126)
BUN: 25 mg/dL — AB (ref 6–20)
CHLORIDE: 92 mmol/L — AB (ref 101–111)
CO2: 30 mmol/L (ref 22–32)
Calcium: 7.8 mg/dL — ABNORMAL LOW (ref 8.9–10.3)
Creatinine, Ser: 0.85 mg/dL (ref 0.44–1.00)
GFR calc Af Amer: >60 mL/min (ref >60)
GFR calc non Af Amer: >60 mL/min (ref >60)
GLUCOSE: 261 mg/dL — AB (ref 65–99)
POTASSIUM: 2.8 mmol/L — AB (ref 3.5–5.1)
SODIUM: 131 mmol/L — AB (ref 135–145)
TOTAL PROTEIN: 4.8 g/dL — AB (ref 6.5–8.1)
Total Bilirubin: 0.6 mg/dL (ref 0.3–1.2)

## 2016-05-15 LAB — URINALYSIS, ROUTINE W REFLEX MICROSCOPIC
GLUCOSE, UA: 100 mg/dL — AB
KETONES UR: NEGATIVE mg/dL
LEUKOCYTES UA: NEGATIVE
NITRITE: NEGATIVE
PROTEIN: 100 mg/dL — AB
Specific Gravity, Urine: 1.029 (ref 1.005–1.030)
pH: 6.5 (ref 5.0–8.0)

## 2016-05-15 LAB — CBC
HCT: 23.8 % — ABNORMAL LOW (ref 36.0–46.0)
Hemoglobin: 7.3 g/dL — ABNORMAL LOW (ref 12.0–15.0)
MCH: 26.4 pg (ref 26.0–34.0)
MCHC: 30.7 g/dL (ref 30.0–36.0)
MCV: 85.9 fL (ref 78.0–100.0)
Platelets: 150 K/uL (ref 150–400)
RBC: 2.77 MIL/uL — ABNORMAL LOW (ref 3.87–5.11)
RDW: 14.5 % (ref 11.5–15.5)
WBC: 6.6 K/uL (ref 4.0–10.5)

## 2016-05-15 LAB — TYPE AND SCREEN
ABO/RH(D): A POS
ANTIBODY SCREEN: NEGATIVE

## 2016-05-15 LAB — GLUCOSE, CAPILLARY
Glucose-Capillary: 161 mg/dL — ABNORMAL HIGH (ref 65–99)
Glucose-Capillary: 133 mg/dL — ABNORMAL HIGH (ref 65–99)

## 2016-05-15 LAB — PROTIME-INR
INR: 1.38
PROTHROMBIN TIME: 17.1 s — AB (ref 11.4–15.2)

## 2016-05-15 LAB — MAGNESIUM: MAGNESIUM: 2 mg/dL (ref 1.7–2.4)

## 2016-05-15 LAB — URINE MICROSCOPIC-ADD ON

## 2016-05-15 LAB — I-STAT ARTERIAL BLOOD GAS, ED
ACID-BASE EXCESS: 8 mmol/L — AB (ref 0.0–2.0)
BICARBONATE: 31.8 mmol/L — AB (ref 20.0–28.0)
O2 Saturation: 97 %
PH ART: 7.475 — AB (ref 7.350–7.450)
TCO2: 33 mmol/L (ref 0–100)
pCO2 arterial: 43.4 mmHg (ref 32.0–48.0)
pO2, Arterial: 86 mmHg (ref 83.0–108.0)

## 2016-05-15 LAB — I-STAT CG4 LACTIC ACID, ED: Lactic Acid, Venous: 1.87 mmol/L (ref 0.5–1.9)

## 2016-05-15 LAB — PREALBUMIN: PREALBUMIN: 4.4 mg/dL — AB (ref 18–38)

## 2016-05-15 LAB — MRSA PCR SCREENING: MRSA by PCR: NEGATIVE

## 2016-05-15 LAB — ABO/RH: ABO/RH(D): A POS

## 2016-05-15 LAB — PHOSPHORUS: Phosphorus: 2.6 mg/dL (ref 2.5–4.6)

## 2016-05-15 LAB — PROCALCITONIN: Procalcitonin: 0.57 ng/mL

## 2016-05-15 MED ORDER — DEXTROSE 5 % IV SOLN
2.0000 g | Freq: Two times a day (BID) | INTRAVENOUS | Status: DC
  Administered 2016-05-15 (×2): 2 g via INTRAVENOUS
  Filled 2016-05-15 (×5): qty 2

## 2016-05-15 MED ORDER — VANCOMYCIN HCL IN DEXTROSE 750-5 MG/150ML-% IV SOLN
750.0000 mg | Freq: Two times a day (BID) | INTRAVENOUS | Status: DC
  Administered 2016-05-16: 750 mg via INTRAVENOUS
  Filled 2016-05-15 (×2): qty 150

## 2016-05-15 MED ORDER — HEPARIN SODIUM (PORCINE) 5000 UNIT/ML IJ SOLN
5000.0000 [IU] | Freq: Three times a day (TID) | INTRAMUSCULAR | Status: DC
  Administered 2016-05-15 – 2016-05-16 (×2): 5000 [IU] via SUBCUTANEOUS
  Filled 2016-05-15 (×2): qty 1

## 2016-05-15 MED ORDER — CETYLPYRIDINIUM CHLORIDE 0.05 % MT LIQD
7.0000 mL | Freq: Two times a day (BID) | OROMUCOSAL | Status: DC
  Administered 2016-05-15 – 2016-06-12 (×45): 7 mL via OROMUCOSAL

## 2016-05-15 MED ORDER — FLUCONAZOLE IN SODIUM CHLORIDE 100-0.9 MG/50ML-% IV SOLN
100.0000 mg | INTRAVENOUS | Status: DC
Start: 2016-05-15 — End: 2016-05-15
  Administered 2016-05-15: 100 mg via INTRAVENOUS
  Filled 2016-05-15: qty 50

## 2016-05-15 MED ORDER — ACETAMINOPHEN 650 MG RE SUPP
650.0000 mg | Freq: Once | RECTAL | Status: AC
  Administered 2016-05-15: 650 mg via RECTAL
  Filled 2016-05-15: qty 1

## 2016-05-15 MED ORDER — PANTOPRAZOLE SODIUM 40 MG IV SOLR
40.0000 mg | Freq: Two times a day (BID) | INTRAVENOUS | Status: DC
  Administered 2016-05-15 – 2016-05-26 (×21): 40 mg via INTRAVENOUS
  Filled 2016-05-15 (×22): qty 40

## 2016-05-15 MED ORDER — ALBUTEROL SULFATE (2.5 MG/3ML) 0.083% IN NEBU: 2.5000 mg | INHALATION_SOLUTION | Freq: Four times a day (QID) | RESPIRATORY_TRACT | Status: DC | PRN

## 2016-05-15 MED ORDER — CHLORHEXIDINE GLUCONATE 0.12 % MT SOLN
15.0000 mL | Freq: Two times a day (BID) | OROMUCOSAL | Status: DC
  Administered 2016-05-15 – 2016-06-13 (×53): 15 mL via OROMUCOSAL
  Filled 2016-05-15 (×33): qty 15

## 2016-05-15 MED ORDER — HYDRALAZINE HCL 20 MG/ML IJ SOLN
5.0000 mg | INTRAMUSCULAR | Status: DC | PRN
  Administered 2016-05-29: 10 mg via INTRAVENOUS
  Filled 2016-05-15: qty 1

## 2016-05-15 MED ORDER — MAGNESIUM SULFATE 2 GM/50ML IV SOLN
2.0000 g | Freq: Once | INTRAVENOUS | Status: AC
  Administered 2016-05-15: 2 g via INTRAVENOUS
  Filled 2016-05-15: qty 50

## 2016-05-15 MED ORDER — VANCOMYCIN HCL IN DEXTROSE 1-5 GM/200ML-% IV SOLN: 1000.0000 mg | Freq: Once | INTRAVENOUS | Status: DC

## 2016-05-15 MED ORDER — SODIUM CHLORIDE 0.9% FLUSH
3.0000 mL | Freq: Two times a day (BID) | INTRAVENOUS | Status: DC
  Administered 2016-05-15 – 2016-05-17 (×4): 3 mL via INTRAVENOUS
  Administered 2016-05-18: 10 mL via INTRAVENOUS
  Administered 2016-05-19 – 2016-05-23 (×8): 3 mL via INTRAVENOUS
  Administered 2016-05-24: 10 mL via INTRAVENOUS
  Administered 2016-05-26 – 2016-06-12 (×18): 3 mL via INTRAVENOUS

## 2016-05-15 MED ORDER — INSULIN ASPART 100 UNIT/ML ~~LOC~~ SOLN
0.0000 [IU] | Freq: Every day | SUBCUTANEOUS | Status: DC
Start: 2016-05-15 — End: 2016-05-16

## 2016-05-15 MED ORDER — POTASSIUM CHLORIDE 10 MEQ/100ML IV SOLN
10.0000 meq | INTRAVENOUS | Status: AC
  Administered 2016-05-15 – 2016-05-16 (×6): 10 meq via INTRAVENOUS
  Filled 2016-05-15 (×6): qty 100

## 2016-05-15 MED ORDER — VANCOMYCIN HCL IN DEXTROSE 1-5 GM/200ML-% IV SOLN
1000.0000 mg | Freq: Once | INTRAVENOUS | Status: AC
  Administered 2016-05-15: 1000 mg via INTRAVENOUS
  Filled 2016-05-15: qty 200

## 2016-05-15 MED ORDER — ONDANSETRON HCL 4 MG/2ML IJ SOLN
4.0000 mg | Freq: Four times a day (QID) | INTRAMUSCULAR | Status: DC | PRN
  Administered 2016-05-24 – 2016-06-12 (×7): 4 mg via INTRAVENOUS
  Filled 2016-05-15 (×8): qty 2

## 2016-05-15 MED ORDER — SODIUM CHLORIDE 0.9 % IV SOLN
INTRAVENOUS | Status: AC
  Administered 2016-05-15: 100 mL/h via INTRAVENOUS
  Administered 2016-05-16: 04:00:00 via INTRAVENOUS

## 2016-05-15 MED ORDER — INSULIN ASPART 100 UNIT/ML ~~LOC~~ SOLN: 0.0000 [IU] | Freq: Three times a day (TID) | SUBCUTANEOUS | Status: DC

## 2016-05-15 MED ORDER — SODIUM CHLORIDE 0.9 % IV BOLUS (SEPSIS)
1000.0000 mL | Freq: Once | INTRAVENOUS | Status: AC
  Administered 2016-05-15: 1000 mL via INTRAVENOUS

## 2016-05-15 NOTE — Progress Notes (Signed)
CRITICAL VALUE ALERT  Critical value received:  K 2.6  Date of notification:  05/16/2016  Time of notification:  2054  Critical value read back: yes  Nurse who received alert: Atilano Ina, RN  MD notified (1st page):  Baltazar Najjar  Time of first page:  2057

## 2016-05-15 NOTE — ED Provider Notes (Addendum)
[facility] DEPT Provider Note   CSN: [id number] Arrival date & time: 04/29/2016  1025     History   Chief Complaint Chief Complaint  Patient presents with  . Code Sepsis    HPI [name] [name] is a 81 y.o. female.  HPI 81 y.o. female with medical history significant of degenerative disc disease, diabetes, diverticulosis, GERD, hiatal hernia, HLD, HTN, chronic hyponatremia comes in from SNF for fevers. PT was admitted earlier in the month for PNA and she just finished iv unasyn yday and is still on iv diflucan. Pt's daughter also assisted in the hx and the nursing home was also contacted.  Pt denies nausea, emesis, fevers, chills, chest pains, shortness of breath, headaches, abdominal pain, uti like symptoms.   Past Medical History:  Diagnosis Date  . DDD (degenerative disc disease), lumbar   . Diabetes mellitus   . Diverticulosis   . Endocervical polyp   . Endometrial polyp   . GERD (gastroesophageal reflux disease)   . Hiatal hernia   . Hx of adenomatous colonic polyps   . Hyperlipidemia   . Hypertension   . Hyponatremia     Patient Active Problem List   Diagnosis Date Noted  . Diastolic CHF ([place]) [id number]  . Dyspnea   . Anxiety state   . Terminal care   . Goals of care, counseling/discussion   . Palliative care encounter   . Escherichia coli infection   . Bacteremia due to Klebsiella pneumoniae   . Acute respiratory failure with hypoxia ([place])   . Disorientation   . Hyponatremia   . Controlled type 2 diabetes mellitus without complication ([place])   . Gastric mass   . Gastric adenocarcinoma ([place])   . Fungemia 05/01/2016  . Erosive esophagitis   . Candida glabrata infection   . Sepsis ([place]) 04/28/2016  . Acute respiratory failure ([place]) 05/10/2016  . Acute encephalopathy 05/05/2016  . On total parenteral nutrition (TPN) 05/05/2016  . Normocytic anemia 05/03/2016  . Essential hypertension 05/14/2016  . 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 GERD with  stricture 04/25/2016  . Upper GI bleed   . Gastric tumor   . Aspiration into airway   . Gastric outlet obstruction   . Melena 04/15/2016  . Fall at home 04/15/2016  . Intramural leiomyoma of uterus 08/25/2015  . Urinary incontinence 06/02/2013  . Nocturia 06/02/2013  . Rectocele 06/02/2013  . Vaginal atrophy 06/02/2013  . Sebaceous cyst of labia 05/01/2013  . DM2 (diabetes mellitus, type 2) ([place])   . Reflux   . Sleep apnea     Past Surgical History:  Procedure Laterality Date  . CHOLECYSTECTOMY    . DILATION AND CURETTAGE OF UTERUS    . ESOPHAGOGASTRODUODENOSCOPY (EGD) WITH PROPOFOL N/A 04/17/2016   Procedure: ESOPHAGOGASTRODUODENOSCOPY (EGD) WITH PROPOFOL;  Surgeon: Manus Gunning, MD;  Location: [facility];  Service: Gastroenterology;  Laterality: N/A;  . ESOPHAGOGASTRODUODENOSCOPY (EGD) WITH PROPOFOL N/A 04/29/2016   Procedure: ESOPHAGOGASTRODUODENOSCOPY (EGD) WITH PROPOFOL;  Surgeon: Mauri Pole, MD;  Location: [facility];  Service: Endoscopy;  Laterality: N/A;  . HYSTEROSCOPY    . TONSILLECTOMY      OB History    Gravida Para Term Preterm AB Living   1 1 1     1    SAB TAB Ectopic Multiple Live Births                   Home Medications    Prior to Admission medications   Medication Sig Start Date End  Date Taking? Authorizing Provider  acetaminophen (TYLENOL) 325 MG tablet Take 325 mg by mouth every 6 (six) hours as needed for moderate pain or headache.    Yes Historical Provider, MD  ADULT TPN Inject into the vein See admin instructions. With lipids to run at 70 ml's/hr continuous   Yes Historical Provider, MD  albuterol (PROVENTIL) (2.5 MG/3ML) 0.083% nebulizer solution Take 3 mLs (2.5 mg total) by nebulization every 6 (six) hours as needed for wheezing or shortness of breath. 04/27/16  Yes [name], MD  alteplase (CATHFLO ACTIVASE) 2 MG injection 2 mg by Intracatheter route once as needed for open catheter.   Yes Historical Provider, MD    bisacodyl (DULCOLAX) 10 MG suppository Place 1 suppository (10 mg total) rectally daily as needed for moderate constipation. Patient taking differently: Place 10 mg rectally daily as needed (for constipation).  04/27/16  Yes [name], MD  dextrose 10 % infusion Inject into the vein See admin instructions. "68 ml's/ hr as needed for supplement dextrose 10% hang if there is any interruption of TPN"   Yes Historical Provider, MD  insulin aspart (NOVOLOG) 100 UNIT/ML injection Inject 0-20 Units into the skin every 4 (four) hours. Patient taking differently: Inject 0-10 Units into the skin 4 (four) times daily -  before meals and at bedtime. Per sliding scale: BGL 0-150 = 0 units; 151-200 = 2 units; 201-250 = 4 units; 251-300 = 6 units; 301-350 = 8 units; 351-400 = 10 units; >401 = NOTIFY THE DOCTOR 04/27/16  Yes [name], MD  Insulin Glargine (LANTUS SOLOSTAR) 100 UNIT/ML Solostar Pen Inject 16 Units into the skin every morning.   Yes Historical Provider, MD  Multiple Vitamin (MULTIVITAMIN) LIQD Take 10 mLs by mouth daily.   Yes Historical Provider, MD  ondansetron (ZOFRAN) 4 MG tablet Take 4 mg by mouth See admin instructions. Every six to eight hours as needed for nausea or vomiting   Yes Historical Provider, MD  pantoprazole (PROTONIX) 40 MG tablet Take 40 mg by mouth 2 (two) times daily.   Yes Historical Provider, MD  promethazine 25 mg in sodium chloride 0.9 % 1,000 mL Inject 12.5 mg into the vein every 6 (six) hours as needed (for nausea).   Yes Historical Provider, MD  ampicillin-sulbactam 1.5 g in sodium chloride 0.9 % 50 mL Inject 1.5 g into the vein every 6 (six) hours. 04/27/16 05/14/17  [name], MD  antiseptic oral rinse (CPC / CETYLPYRIDINIUM CHLORIDE 0.05%) 0.05 % LIQD solution 7 mLs by Mouth Rinse route 2 times daily at 12 noon and 4 pm. 04/27/16   [name], MD  chlorhexidine (PERIDEX) 0.12 % solution 15 mLs by Mouth Rinse route 2 (two) times daily. 04/27/16    [name], MD  erythromycin ophthalmic ointment Place into both eyes at bedtime. 04/27/16   [name], MD  fluconazole (DIFLUCAN) 200-0.9 MG/100ML-% IVPB Inject 100 mLs (200 mg total) into the vein daily. 04/27/16 04/17/17  [name], MD  ondansetron (ZOFRAN) 4 MG/2ML SOLN injection Inject 2 mLs (4 mg total) into the vein every 6 (six) hours as needed for nausea. 04/27/16   [name], MD  pantoprazole (PROTONIX) 40 MG injection Inject 40 mg into the vein every 12 (twelve) hours. 04/27/16   [name], MD  Potassium Chloride in NaCl (0.9 % NACL WITH KCL 20 MEQ / L) 20-0.9 MEQ/L-% Inject 30 mL/hr into the vein continuous. 04/27/16   Christina  P Rama, MD  sodium chloride flush (NS) 0.9 % SOLN 10-40 mLs by Intracatheter route as needed (flush). 04/27/16   [name], MD  sodium chloride flush (NS) 0.9 % SOLN Inject 3 mLs into the vein every 12 (twelve) hours. 04/27/16   [name], MD    Family History Family History  Problem Relation Age of Onset  . Hypertension Mother   . Stroke Mother   . Diabetes Sister     x 2  . Lung cancer Brother     mets  . Alcohol abuse Father   . Breast cancer Cousin     Mat. 1st cousin-Age 64    Social History Social History  Substance Use Topics  . Smoking status: Never Smoker  . Smokeless tobacco: Never Used  . Alcohol use No     Allergies   Ace inhibitors; Prednisone; and Sitagliptin   Review of Systems Review of Systems  ROS 10 Systems reviewed and are negative for acute change except as noted in the HPI.      Physical Exam Updated Vital Signs BP 131/73 (BP Location: Left Arm)   Pulse (!) 113   Temp 97.9 F (36.6 C) (Oral)   Resp 17   Ht 5\' 2"  (1.575 m)   Wt 170 lb 3.1 oz (77.2 kg)   SpO2 95%   BMI 31.13 kg/m   Physical Exam  Constitutional: She appears well-developed.  HENT:  Head: Normocephalic and atraumatic.  Eyes: EOM are normal.  Neck: Normal range of motion. Neck supple.    Cardiovascular:  tachycardia  Pulmonary/Chest: Effort normal. She has rales.  Abdominal: Bowel sounds are normal.  Neurological:  somnolent  Skin: Skin is warm and dry.  Nursing note and vitals reviewed.    ED Treatments / Results  Labs (all labs ordered are listed, but only abnormal results are displayed) Labs Reviewed  CULTURE, BLOOD (ROUTINE X 2) - Abnormal; Notable for the following:       Result Value   Culture   (*)    Value: CANDIDA GLABRATA SEE SEPARATE REPORT FOR ANTIFUNGAL SUSCEPTIBILITY RESULTS    All other components within normal limits  CULTURE, BLOOD (ROUTINE X 2) - Abnormal; Notable for the following:    Culture  Setup Time   (*)    Value: YEAST AEROBIC BOTTLE ONLY CRITICAL RESULT CALLED TO, READ BACK BY AND VERIFIED WITH: Colin Rhein, PHARM AT [facility] [id number] BY S. YARBROUGH    Culture CANDIDA GLABRATA (*)    All other components within normal limits  BLOOD CULTURE ID PANEL (REFLEXED) - Abnormal; Notable for the following:    Candida glabrata DETECTED (*)    All other components within normal limits  CULTURE, BLOOD (ROUTINE X 2) - Abnormal; Notable for the following:    Culture KLEBSIELLA PNEUMONIAE (*)    All other components within normal limits  BLOOD CULTURE ID PANEL (REFLEXED) - Abnormal; Notable for the following:    Enterobacteriaceae species DETECTED (*)    Klebsiella pneumoniae DETECTED (*)    All other components within normal limits  URINE CULTURE - Abnormal; Notable for the following:    Culture >=100,000 COLONIES/mL ESCHERICHIA COLI (*)    Organism ID, Bacteria ESCHERICHIA COLI (*)    All other components within normal limits  COMPREHENSIVE METABOLIC PANEL - Abnormal; Notable for the following:    Sodium 131 (*)    Potassium 2.8 (*)    Chloride 92 (*)    Glucose, Bld 261 (*)  BUN 25 (*)    Calcium 7.8 (*)    Total Protein 4.8 (*)    Albumin 1.8 (*)    All other components within normal limits  CBC WITH DIFFERENTIAL/PLATELET - Abnormal;  Notable for the following:    RBC 3.13 (*)    Hemoglobin 8.4 (*)    HCT 26.8 (*)    Neutro Abs 7.8 (*)    Lymphs Abs 0.6 (*)    All other components within normal limits  URINALYSIS, ROUTINE W REFLEX MICROSCOPIC (NOT AT [facility]) - Abnormal; Notable for the following:    Color, Urine AMBER (*)    Glucose, UA 100 (*)    Hgb urine dipstick SMALL (*)    Bilirubin Urine SMALL (*)    Protein, ur 100 (*)    All other components within normal limits  PROTIME-INR - Abnormal; Notable for the following:    Prothrombin Time 17.1 (*)    All other components within normal limits  URINE MICROSCOPIC-ADD ON - Abnormal; Notable for the following:    Squamous Epithelial / LPF 0-5 (*)    Bacteria, UA FEW (*)    Casts GRANULAR CAST (*)    All other components within normal limits  BASIC METABOLIC PANEL - Abnormal; Notable for the following:    Sodium 132 (*)    Potassium 2.6 (*)    Chloride 95 (*)    Glucose, Bld 170 (*)    BUN 23 (*)    Calcium 7.2 (*)    All other components within normal limits  CBC - Abnormal; Notable for the following:    RBC 2.77 (*)    Hemoglobin 7.3 (*)    HCT 23.8 (*)    All other components within normal limits  TROPONIN I - Abnormal; Notable for the following:    Troponin I 0.06 (*)    All other components within normal limits  PREALBUMIN - Abnormal; Notable for the following:    Prealbumin 4.4 (*)    All other components within normal limits  COMPREHENSIVE METABOLIC PANEL - Abnormal; Notable for the following:    Sodium 133 (*)    Chloride 98 (*)    Glucose, Bld 102 (*)    BUN 24 (*)    Calcium 7.3 (*)    Total Protein 4.5 (*)    Albumin 1.6 (*)    AST 67 (*)    All other components within normal limits  PREALBUMIN - Abnormal; Notable for the following:    Prealbumin 3.5 (*)    All other components within normal limits  CBC - Abnormal; Notable for the following:    RBC 3.17 (*)    Hemoglobin 8.4 (*)    HCT 26.9 (*)    Platelets 142 (*)    All other  components within normal limits  GLUCOSE, CAPILLARY - Abnormal; Notable for the following:    Glucose-Capillary 161 (*)    All other components within normal limits  GLUCOSE, CAPILLARY - Abnormal; Notable for the following:    Glucose-Capillary 133 (*)    All other components within normal limits  GLUCOSE, CAPILLARY - Abnormal; Notable for the following:    Glucose-Capillary 102 (*)    All other components within normal limits  COMPREHENSIVE METABOLIC PANEL - Abnormal; Notable for the following:    Sodium 133 (*)    Potassium 3.2 (*)    Chloride 100 (*)    Glucose, Bld 152 (*)    Calcium 6.9 (*)    Total Protein  3.9 (*)    Albumin 1.3 (*)    Total Bilirubin 0.2 (*)    All other components within normal limits  GLUCOSE, CAPILLARY - Abnormal; Notable for the following:    Glucose-Capillary 117 (*)    All other components within normal limits  CBC - Abnormal; Notable for the following:    RBC 2.62 (*)    Hemoglobin 7.1 (*)    HCT 22.5 (*)    Platelets 124 (*)    All other components within normal limits  GLUCOSE, CAPILLARY - Abnormal; Notable for the following:    Glucose-Capillary 141 (*)    All other components within normal limits  GLUCOSE, CAPILLARY - Abnormal; Notable for the following:    Glucose-Capillary 131 (*)    All other components within normal limits  GLUCOSE, CAPILLARY - Abnormal; Notable for the following:    Glucose-Capillary 169 (*)    All other components within normal limits  GLUCOSE, CAPILLARY - Abnormal; Notable for the following:    Glucose-Capillary 220 (*)    All other components within normal limits  BASIC METABOLIC PANEL - Abnormal; Notable for the following:    Sodium 133 (*)    Glucose, Bld 154 (*)    Calcium 7.2 (*)    All other components within normal limits  GLUCOSE, CAPILLARY - Abnormal; Notable for the following:    Glucose-Capillary 185 (*)    All other components within normal limits  GLUCOSE, CAPILLARY - Abnormal; Notable for the  following:    Glucose-Capillary 219 (*)    All other components within normal limits  GLUCOSE, CAPILLARY - Abnormal; Notable for the following:    Glucose-Capillary 156 (*)    All other components within normal limits  GLUCOSE, CAPILLARY - Abnormal; Notable for the following:    Glucose-Capillary 167 (*)    All other components within normal limits  GLUCOSE, CAPILLARY - Abnormal; Notable for the following:    Glucose-Capillary 193 (*)    All other components within normal limits  GLUCOSE, CAPILLARY - Abnormal; Notable for the following:    Glucose-Capillary 185 (*)    All other components within normal limits  BASIC METABOLIC PANEL - Abnormal; Notable for the following:    Sodium 134 (*)    Glucose, Bld 141 (*)    Calcium 7.5 (*)    Anion gap 4 (*)    All other components within normal limits  GLUCOSE, CAPILLARY - Abnormal; Notable for the following:    Glucose-Capillary 208 (*)    All other components within normal limits  GLUCOSE, CAPILLARY - Abnormal; Notable for the following:    Glucose-Capillary 125 (*)    All other components within normal limits  GLUCOSE, CAPILLARY - Abnormal; Notable for the following:    Glucose-Capillary 136 (*)    All other components within normal limits  GLUCOSE, CAPILLARY - Abnormal; Notable for the following:    Glucose-Capillary 118 (*)    All other components within normal limits  GLUCOSE, CAPILLARY - Abnormal; Notable for the following:    Glucose-Capillary 149 (*)    All other components within normal limits  GLUCOSE, CAPILLARY - Abnormal; Notable for the following:    Glucose-Capillary 168 (*)    All other components within normal limits  CBC - Abnormal; Notable for the following:    WBC 3.7 (*)    RBC 2.35 (*)    Hemoglobin 6.2 (*)    HCT 20.2 (*)    Platelets 140 (*)    All other  components within normal limits  BASIC METABOLIC PANEL - Abnormal; Notable for the following:    Potassium 3.4 (*)    Glucose, Bld 143 (*)    Calcium  6.6 (*)    Anion gap 3 (*)    All other components within normal limits  GLUCOSE, CAPILLARY - Abnormal; Notable for the following:    Glucose-Capillary 136 (*)    All other components within normal limits  GLUCOSE, CAPILLARY - Abnormal; Notable for the following:    Glucose-Capillary 150 (*)    All other components within normal limits  GLUCOSE, CAPILLARY - Abnormal; Notable for the following:    Glucose-Capillary 166 (*)    All other components within normal limits  GLUCOSE, CAPILLARY - Abnormal; Notable for the following:    Glucose-Capillary 195 (*)    All other components within normal limits  GLUCOSE, CAPILLARY - Abnormal; Notable for the following:    Glucose-Capillary 145 (*)    All other components within normal limits  COMPREHENSIVE METABOLIC PANEL - Abnormal; Notable for the following:    Sodium 134 (*)    Glucose, Bld 109 (*)    Calcium 7.8 (*)    Total Protein 4.6 (*)    Albumin 1.7 (*)    Anion gap 4 (*)    All other components within normal limits  MAGNESIUM - Abnormal; Notable for the following:    Magnesium 1.4 (*)    All other components within normal limits  CBC - Abnormal; Notable for the following:    Hemoglobin 10.5 (*)    HCT 32.7 (*)    All other components within normal limits  PREALBUMIN - Abnormal; Notable for the following:    Prealbumin 8.1 (*)    All other components within normal limits  GLUCOSE, CAPILLARY - Abnormal; Notable for the following:    Glucose-Capillary 156 (*)    All other components within normal limits  GLUCOSE, CAPILLARY - Abnormal; Notable for the following:    Glucose-Capillary 145 (*)    All other components within normal limits  GLUCOSE, CAPILLARY - Abnormal; Notable for the following:    Glucose-Capillary 109 (*)    All other components within normal limits  GLUCOSE, CAPILLARY - Abnormal; Notable for the following:    Glucose-Capillary 141 (*)    All other components within normal limits  GLUCOSE, CAPILLARY - Abnormal;  Notable for the following:    Glucose-Capillary 108 (*)    All other components within normal limits  GLUCOSE, CAPILLARY - Abnormal; Notable for the following:    Glucose-Capillary 131 (*)    All other components within normal limits  MAGNESIUM - Abnormal; Notable for the following:    Magnesium 1.5 (*)    All other components within normal limits  BASIC METABOLIC PANEL - Abnormal; Notable for the following:    Sodium 133 (*)    Glucose, Bld 113 (*)    Calcium 8.1 (*)    All other components within normal limits  CBC - Abnormal; Notable for the following:    Hemoglobin 10.8 (*)    HCT 33.7 (*)    All other components within normal limits  PROTIME-INR - Abnormal; Notable for the following:    Prothrombin Time 15.5 (*)    All other components within normal limits  GLUCOSE, CAPILLARY - Abnormal; Notable for the following:    Glucose-Capillary 183 (*)    All other components within normal limits  GLUCOSE, CAPILLARY - Abnormal; Notable for the following:    Glucose-Capillary 110 (*)  All other components within normal limits  GLUCOSE, CAPILLARY - Abnormal; Notable for the following:    Glucose-Capillary 128 (*)    All other components within normal limits  GLUCOSE, CAPILLARY - Abnormal; Notable for the following:    Glucose-Capillary 121 (*)    All other components within normal limits  GLUCOSE, CAPILLARY - Abnormal; Notable for the following:    Glucose-Capillary 156 (*)    All other components within normal limits  GLUCOSE, CAPILLARY - Abnormal; Notable for the following:    Glucose-Capillary 128 (*)    All other components within normal limits  BASIC METABOLIC PANEL - Abnormal; Notable for the following:    Sodium 130 (*)    Chloride 95 (*)    Glucose, Bld 149 (*)    BUN 24 (*)    Calcium 7.7 (*)    All other components within normal limits  GLUCOSE, CAPILLARY - Abnormal; Notable for the following:    Glucose-Capillary 138 (*)    All other components within normal  limits  GLUCOSE, CAPILLARY - Abnormal; Notable for the following:    Glucose-Capillary 164 (*)    All other components within normal limits  GLUCOSE, CAPILLARY - Abnormal; Notable for the following:    Glucose-Capillary 149 (*)    All other components within normal limits  GLUCOSE, CAPILLARY - Abnormal; Notable for the following:    Glucose-Capillary 187 (*)    All other components within normal limits  GLUCOSE, CAPILLARY - Abnormal; Notable for the following:    Glucose-Capillary 218 (*)    All other components within normal limits  GLUCOSE, CAPILLARY - Abnormal; Notable for the following:    Glucose-Capillary 209 (*)    All other components within normal limits  COMPREHENSIVE METABOLIC PANEL - Abnormal; Notable for the following:    Sodium 129 (*)    Chloride 99 (*)    BUN 27 (*)    Calcium 8.0 (*)    Total Protein 4.7 (*)    Albumin 1.7 (*)    All other components within normal limits  CBC - Abnormal; Notable for the following:    Hemoglobin 10.6 (*)    HCT 32.6 (*)    All other components within normal limits  GLUCOSE, CAPILLARY - Abnormal; Notable for the following:    Glucose-Capillary 116 (*)    All other components within normal limits  GLUCOSE, CAPILLARY - Abnormal; Notable for the following:    Glucose-Capillary 102 (*)    All other components within normal limits  GLUCOSE, CAPILLARY - Abnormal; Notable for the following:    Glucose-Capillary 101 (*)    All other components within normal limits  GLUCOSE, CAPILLARY - Abnormal; Notable for the following:    Glucose-Capillary 111 (*)    All other components within normal limits  GLUCOSE, CAPILLARY - Abnormal; Notable for the following:    Glucose-Capillary 112 (*)    All other components within normal limits  GLUCOSE, CAPILLARY - Abnormal; Notable for the following:    Glucose-Capillary 139 (*)    All other components within normal limits  GLUCOSE, CAPILLARY - Abnormal; Notable for the following:     Glucose-Capillary 125 (*)    All other components within normal limits  BASIC METABOLIC PANEL - Abnormal; Notable for the following:    Sodium 128 (*)    Chloride 100 (*)    CO2 21 (*)    Glucose, Bld 107 (*)    BUN 28 (*)    Calcium 8.0 (*)  All other components within normal limits  GLUCOSE, CAPILLARY - Abnormal; Notable for the following:    Glucose-Capillary 137 (*)    All other components within normal limits  GLUCOSE, CAPILLARY - Abnormal; Notable for the following:    Glucose-Capillary 115 (*)    All other components within normal limits  GLUCOSE, CAPILLARY - Abnormal; Notable for the following:    Glucose-Capillary 121 (*)    All other components within normal limits  GLUCOSE, CAPILLARY - Abnormal; Notable for the following:    Glucose-Capillary 122 (*)    All other components within normal limits  GLUCOSE, CAPILLARY - Abnormal; Notable for the following:    Glucose-Capillary 144 (*)    All other components within normal limits  SODIUM - Abnormal; Notable for the following:    Sodium 128 (*)    All other components within normal limits  SODIUM - Abnormal; Notable for the following:    Sodium 128 (*)    All other components within normal limits  SODIUM - Abnormal; Notable for the following:    Sodium 129 (*)    All other components within normal limits  BASIC METABOLIC PANEL - Abnormal; Notable for the following:    Sodium 128 (*)    Chloride 99 (*)    Glucose, Bld 122 (*)    BUN 31 (*)    Calcium 8.2 (*)    All other components within normal limits  GLUCOSE, CAPILLARY - Abnormal; Notable for the following:    Glucose-Capillary 102 (*)    All other components within normal limits  SODIUM - Abnormal; Notable for the following:    Sodium 128 (*)    All other components within normal limits  CBC WITH DIFFERENTIAL/PLATELET - Abnormal; Notable for the following:    RBC 3.83 (*)    Hemoglobin 10.3 (*)    HCT 31.8 (*)    All other components within normal limits    OSMOLALITY - Abnormal; Notable for the following:    Osmolality 272 (*)    All other components within normal limits  GLUCOSE, CAPILLARY - Abnormal; Notable for the following:    Glucose-Capillary 102 (*)    All other components within normal limits  BASIC METABOLIC PANEL - Abnormal; Notable for the following:    Sodium 127 (*)    Chloride 99 (*)    Glucose, Bld 125 (*)    BUN 28 (*)    Calcium 8.0 (*)    Anion gap 4 (*)    All other components within normal limits  MAGNESIUM - Abnormal; Notable for the following:    Magnesium 1.6 (*)    All other components within normal limits  GLUCOSE, CAPILLARY - Abnormal; Notable for the following:    Glucose-Capillary 123 (*)    All other components within normal limits  GLUCOSE, CAPILLARY - Abnormal; Notable for the following:    Glucose-Capillary 109 (*)    All other components within normal limits  COMPREHENSIVE METABOLIC PANEL - Abnormal; Notable for the following:    Sodium 129 (*)    Chloride 99 (*)    Glucose, Bld 102 (*)    BUN 26 (*)    Calcium 8.0 (*)    Total Protein 4.8 (*)    Albumin 1.7 (*)    All other components within normal limits  MAGNESIUM - Abnormal; Notable for the following:    Magnesium 1.5 (*)    All other components within normal limits  CBC - Abnormal; Notable for the following:  RBC 3.72 (*)    Hemoglobin 9.8 (*)    HCT 30.7 (*)    All other components within normal limits  PREALBUMIN - Abnormal; Notable for the following:    Prealbumin 5.5 (*)    All other components within normal limits  GLUCOSE, CAPILLARY - Abnormal; Notable for the following:    Glucose-Capillary 120 (*)    All other components within normal limits  BASIC METABOLIC PANEL - Abnormal; Notable for the following:    Sodium 125 (*)    Chloride 97 (*)    Glucose, Bld 131 (*)    BUN 26 (*)    Calcium 7.9 (*)    All other components within normal limits  MAGNESIUM - Abnormal; Notable for the following:    Magnesium 1.6 (*)     All other components within normal limits  GLUCOSE, CAPILLARY - Abnormal; Notable for the following:    Glucose-Capillary 133 (*)    All other components within normal limits  GLUCOSE, CAPILLARY - Abnormal; Notable for the following:    Glucose-Capillary 153 (*)    All other components within normal limits  GLUCOSE, CAPILLARY - Abnormal; Notable for the following:    Glucose-Capillary 104 (*)    All other components within normal limits  GLUCOSE, CAPILLARY - Abnormal; Notable for the following:    Glucose-Capillary 152 (*)    All other components within normal limits  GLUCOSE, CAPILLARY - Abnormal; Notable for the following:    Glucose-Capillary 158 (*)    All other components within normal limits  GLUCOSE, CAPILLARY - Abnormal; Notable for the following:    Glucose-Capillary 112 (*)    All other components within normal limits  BASIC METABOLIC PANEL - Abnormal; Notable for the following:    Sodium 128 (*)    Chloride 98 (*)    Glucose, Bld 116 (*)    BUN 24 (*)    Calcium 8.1 (*)    All other components within normal limits  GLUCOSE, CAPILLARY - Abnormal; Notable for the following:    Glucose-Capillary 122 (*)    All other components within normal limits  GLUCOSE, CAPILLARY - Abnormal; Notable for the following:    Glucose-Capillary 143 (*)    All other components within normal limits  GLUCOSE, CAPILLARY - Abnormal; Notable for the following:    Glucose-Capillary 114 (*)    All other components within normal limits  GLUCOSE, CAPILLARY - Abnormal; Notable for the following:    Glucose-Capillary 120 (*)    All other components within normal limits  GLUCOSE, CAPILLARY - Abnormal; Notable for the following:    Glucose-Capillary 142 (*)    All other components within normal limits  BASIC METABOLIC PANEL - Abnormal; Notable for the following:    Sodium 131 (*)    Glucose, Bld 106 (*)    BUN 29 (*)    Calcium 7.8 (*)    All other components within normal limits  GLUCOSE,  CAPILLARY - Abnormal; Notable for the following:    Glucose-Capillary 154 (*)    All other components within normal limits  GLUCOSE, CAPILLARY - Abnormal; Notable for the following:    Glucose-Capillary 124 (*)    All other components within normal limits  GLUCOSE, CAPILLARY - Abnormal; Notable for the following:    Glucose-Capillary 129 (*)    All other components within normal limits  GLUCOSE, CAPILLARY - Abnormal; Notable for the following:    Glucose-Capillary 131 (*)    All other components within normal limits  GLUCOSE, CAPILLARY - Abnormal; Notable for the following:    Glucose-Capillary 120 (*)    All other components within normal limits  GLUCOSE, CAPILLARY - Abnormal; Notable for the following:    Glucose-Capillary 146 (*)    All other components within normal limits  COMPREHENSIVE METABOLIC PANEL - Abnormal; Notable for the following:    Sodium 134 (*)    Glucose, Bld 144 (*)    BUN 23 (*)    Calcium 8.0 (*)    Total Protein 4.6 (*)    Albumin 1.8 (*)    AST 53 (*)    Alkaline Phosphatase 135 (*)    Anion gap 4 (*)    All other components within normal limits  MAGNESIUM - Abnormal; Notable for the following:    Magnesium 1.4 (*)    All other components within normal limits  CBC WITH DIFFERENTIAL/PLATELET - Abnormal; Notable for the following:    WBC 3.2 (*)    RBC 3.50 (*)    Hemoglobin 9.1 (*)    HCT 29.0 (*)    Platelets 143 (*)    All other components within normal limits  GLUCOSE, CAPILLARY - Abnormal; Notable for the following:    Glucose-Capillary 139 (*)    All other components within normal limits  GLUCOSE, CAPILLARY - Abnormal; Notable for the following:    Glucose-Capillary 202 (*)    All other components within normal limits  GLUCOSE, CAPILLARY - Abnormal; Notable for the following:    Glucose-Capillary 146 (*)    All other components within normal limits  GLUCOSE, CAPILLARY - Abnormal; Notable for the following:    Glucose-Capillary 169 (*)     All other components within normal limits  GLUCOSE, CAPILLARY - Abnormal; Notable for the following:    Glucose-Capillary 178 (*)    All other components within normal limits  CBC WITH DIFFERENTIAL/PLATELET - Abnormal; Notable for the following:    WBC 3.5 (*)    RBC 3.33 (*)    Hemoglobin 8.6 (*)    HCT 27.7 (*)    MCH 25.8 (*)    Platelets 144 (*)    All other components within normal limits  COMPREHENSIVE METABOLIC PANEL - Abnormal; Notable for the following:    Glucose, Bld 165 (*)    BUN 24 (*)    Calcium 7.9 (*)    Total Protein 4.3 (*)    Albumin 1.6 (*)    AST 44 (*)    Alkaline Phosphatase 133 (*)    Anion gap 4 (*)    All other components within normal limits  GLUCOSE, CAPILLARY - Abnormal; Notable for the following:    Glucose-Capillary 175 (*)    All other components within normal limits  GLUCOSE, CAPILLARY - Abnormal; Notable for the following:    Glucose-Capillary 214 (*)    All other components within normal limits  GLUCOSE, CAPILLARY - Abnormal; Notable for the following:    Glucose-Capillary 176 (*)    All other components within normal limits  GLUCOSE, CAPILLARY - Abnormal; Notable for the following:    Glucose-Capillary 176 (*)    All other components within normal limits  GLUCOSE, CAPILLARY - Abnormal; Notable for the following:    Glucose-Capillary 178 (*)    All other components within normal limits  GLUCOSE, CAPILLARY - Abnormal; Notable for the following:    Glucose-Capillary 174 (*)    All other components within normal limits  MAGNESIUM - Abnormal; Notable for the following:    Magnesium 1.5 (*)  All other components within normal limits  GLUCOSE, CAPILLARY - Abnormal; Notable for the following:    Glucose-Capillary 183 (*)    All other components within normal limits  GLUCOSE, CAPILLARY - Abnormal; Notable for the following:    Glucose-Capillary 218 (*)    All other components within normal limits  GLUCOSE, CAPILLARY - Abnormal; Notable  for the following:    Glucose-Capillary 167 (*)    All other components within normal limits  GLUCOSE, CAPILLARY - Abnormal; Notable for the following:    Glucose-Capillary 169 (*)    All other components within normal limits  BASIC METABOLIC PANEL - Abnormal; Notable for the following:    Potassium 3.0 (*)    Glucose, Bld 177 (*)    BUN 27 (*)    Calcium 8.0 (*)    All other components within normal limits  GLUCOSE, CAPILLARY - Abnormal; Notable for the following:    Glucose-Capillary 157 (*)    All other components within normal limits  CBC - Abnormal; Notable for the following:    RBC 3.30 (*)    Hemoglobin 8.7 (*)    HCT 27.1 (*)    Platelets 95 (*)    All other components within normal limits  GLUCOSE, CAPILLARY - Abnormal; Notable for the following:    Glucose-Capillary 135 (*)    All other components within normal limits  GLUCOSE, CAPILLARY - Abnormal; Notable for the following:    Glucose-Capillary 193 (*)    All other components within normal limits  GLUCOSE, CAPILLARY - Abnormal; Notable for the following:    Glucose-Capillary 196 (*)    All other components within normal limits  GLUCOSE, CAPILLARY - Abnormal; Notable for the following:    Glucose-Capillary 159 (*)    All other components within normal limits  MAGNESIUM - Abnormal; Notable for the following:    Magnesium 1.5 (*)    All other components within normal limits  GLUCOSE, CAPILLARY - Abnormal; Notable for the following:    Glucose-Capillary 151 (*)    All other components within normal limits  PREALBUMIN - Abnormal; Notable for the following:    Prealbumin 6.1 (*)    All other components within normal limits  COMPREHENSIVE METABOLIC PANEL - Abnormal; Notable for the following:    Glucose, Bld 286 (*)    BUN 28 (*)    Calcium 8.4 (*)    Total Protein 5.9 (*)    Albumin 2.0 (*)    AST 58 (*)    Alkaline Phosphatase 212 (*)    All other components within normal limits  GLUCOSE, CAPILLARY -  Abnormal; Notable for the following:    Glucose-Capillary 156 (*)    All other components within normal limits  GLUCOSE, CAPILLARY - Abnormal; Notable for the following:    Glucose-Capillary 200 (*)    All other components within normal limits  GLUCOSE, CAPILLARY - Abnormal; Notable for the following:    Glucose-Capillary 293 (*)    All other components within normal limits  GLUCOSE, CAPILLARY - Abnormal; Notable for the following:    Glucose-Capillary 274 (*)    All other components within normal limits  CBC WITH DIFFERENTIAL/PLATELET - Abnormal; Notable for the following:    RBC 3.31 (*)    Hemoglobin 8.5 (*)    HCT 27.5 (*)    MCH 25.7 (*)    Platelets 73 (*)    All other components within normal limits  GLUCOSE, CAPILLARY - Abnormal; Notable for the following:  Glucose-Capillary 232 (*)    All other components within normal limits  URINALYSIS, ROUTINE W REFLEX MICROSCOPIC (NOT AT [facility]) - Abnormal; Notable for the following:    APPearance CLOUDY (*)    Bilirubin Urine SMALL (*)    Protein, ur 100 (*)    Nitrite POSITIVE (*)    Leukocytes, UA MODERATE (*)    All other components within normal limits  GLUCOSE, CAPILLARY - Abnormal; Notable for the following:    Glucose-Capillary 329 (*)    All other components within normal limits  URINE MICROSCOPIC-ADD ON - Abnormal; Notable for the following:    Squamous Epithelial / LPF 6-30 (*)    Bacteria, UA MANY (*)    All other components within normal limits  GLUCOSE, CAPILLARY - Abnormal; Notable for the following:    Glucose-Capillary 136 (*)    All other components within normal limits  GLUCOSE, CAPILLARY - Abnormal; Notable for the following:    Glucose-Capillary 155 (*)    All other components within normal limits  GLUCOSE, CAPILLARY - Abnormal; Notable for the following:    Glucose-Capillary 195 (*)    All other components within normal limits  GLUCOSE, CAPILLARY - Abnormal; Notable for the following:     Glucose-Capillary 230 (*)    All other components within normal limits  GLUCOSE, CAPILLARY - Abnormal; Notable for the following:    Glucose-Capillary 200 (*)    All other components within normal limits  GLUCOSE, CAPILLARY - Abnormal; Notable for the following:    Glucose-Capillary 162 (*)    All other components within normal limits  BASIC METABOLIC PANEL - Abnormal; Notable for the following:    Potassium 2.8 (*)    Glucose, Bld 124 (*)    BUN 31 (*)    Calcium 8.0 (*)    All other components within normal limits  GLUCOSE, CAPILLARY - Abnormal; Notable for the following:    Glucose-Capillary 147 (*)    All other components within normal limits  CBC - Abnormal; Notable for the following:    RBC 3.46 (*)    Hemoglobin 8.9 (*)    HCT 28.5 (*)    MCH 25.7 (*)    Platelets 111 (*)    All other components within normal limits  GLUCOSE, CAPILLARY - Abnormal; Notable for the following:    Glucose-Capillary 110 (*)    All other components within normal limits  GLUCOSE, CAPILLARY - Abnormal; Notable for the following:    Glucose-Capillary 182 (*)    All other components within normal limits  GLUCOSE, CAPILLARY - Abnormal; Notable for the following:    Glucose-Capillary 173 (*)    All other components within normal limits  GLUCOSE, CAPILLARY - Abnormal; Notable for the following:    Glucose-Capillary 159 (*)    All other components within normal limits  GLUCOSE, CAPILLARY - Abnormal; Notable for the following:    Glucose-Capillary 134 (*)    All other components within normal limits  GLUCOSE, CAPILLARY - Abnormal; Notable for the following:    Glucose-Capillary 107 (*)    All other components within normal limits  GLUCOSE, CAPILLARY - Abnormal; Notable for the following:    Glucose-Capillary 110 (*)    All other components within normal limits  MAGNESIUM - Abnormal; Notable for the following:    Magnesium 1.6 (*)    All other components within normal limits  GLUCOSE, CAPILLARY  - Abnormal; Notable for the following:    Glucose-Capillary 156 (*)    All other  components within normal limits  GLUCOSE, CAPILLARY - Abnormal; Notable for the following:    Glucose-Capillary 163 (*)    All other components within normal limits  GLUCOSE, CAPILLARY - Abnormal; Notable for the following:    Glucose-Capillary 127 (*)    All other components within normal limits  GLUCOSE, CAPILLARY - Abnormal; Notable for the following:    Glucose-Capillary 117 (*)    All other components within normal limits  COMPREHENSIVE METABOLIC PANEL - Abnormal; Notable for the following:    Glucose, Bld 133 (*)    BUN 33 (*)    Calcium 7.9 (*)    Total Protein 5.0 (*)    Albumin 1.7 (*)    AST 51 (*)    Alkaline Phosphatase 146 (*)    All other components within normal limits  GLUCOSE, CAPILLARY - Abnormal; Notable for the following:    Glucose-Capillary 145 (*)    All other components within normal limits  GLUCOSE, CAPILLARY - Abnormal; Notable for the following:    Glucose-Capillary 129 (*)    All other components within normal limits  BASIC METABOLIC PANEL - Abnormal; Notable for the following:    Glucose, Bld 151 (*)    BUN 30 (*)    Calcium 8.0 (*)    All other components within normal limits  GLUCOSE, CAPILLARY - Abnormal; Notable for the following:    Glucose-Capillary 146 (*)    All other components within normal limits  GLUCOSE, CAPILLARY - Abnormal; Notable for the following:    Glucose-Capillary 189 (*)    All other components within normal limits  GLUCOSE, CAPILLARY - Abnormal; Notable for the following:    Glucose-Capillary 153 (*)    All other components within normal limits  GLUCOSE, CAPILLARY - Abnormal; Notable for the following:    Glucose-Capillary 131 (*)    All other components within normal limits  GLUCOSE, CAPILLARY - Abnormal; Notable for the following:    Glucose-Capillary 133 (*)    All other components within normal limits  BASIC METABOLIC PANEL -  Abnormal; Notable for the following:    CO2 34 (*)    Glucose, Bld 113 (*)    BUN 32 (*)    Calcium 8.5 (*)    All other components within normal limits  GLUCOSE, CAPILLARY - Abnormal; Notable for the following:    Glucose-Capillary 159 (*)    All other components within normal limits  GLUCOSE, CAPILLARY - Abnormal; Notable for the following:    Glucose-Capillary 154 (*)    All other components within normal limits  GLUCOSE, CAPILLARY - Abnormal; Notable for the following:    Glucose-Capillary 115 (*)    All other components within normal limits  GLUCOSE, CAPILLARY - Abnormal; Notable for the following:    Glucose-Capillary 123 (*)    All other components within normal limits  GLUCOSE, CAPILLARY - Abnormal; Notable for the following:    Glucose-Capillary 168 (*)    All other components within normal limits  GLUCOSE, CAPILLARY - Abnormal; Notable for the following:    Glucose-Capillary 141 (*)    All other components within normal limits  GLUCOSE, CAPILLARY - Abnormal; Notable for the following:    Glucose-Capillary 183 (*)    All other components within normal limits  GLUCOSE, CAPILLARY - Abnormal; Notable for the following:    Glucose-Capillary 162 (*)    All other components within normal limits  GLUCOSE, CAPILLARY - Abnormal; Notable for the following:    Glucose-Capillary 136 (*)  All other components within normal limits  GLUCOSE, CAPILLARY - Abnormal; Notable for the following:    Glucose-Capillary 122 (*)    All other components within normal limits  GLUCOSE, CAPILLARY - Abnormal; Notable for the following:    Glucose-Capillary 113 (*)    All other components within normal limits  CBC - Abnormal; Notable for the following:    WBC 16.4 (*)    RBC 3.48 (*)    Hemoglobin 9.0 (*)    HCT 29.5 (*)    MCH 25.9 (*)    RDW 16.2 (*)    All other components within normal limits  DIFFERENTIAL - Abnormal; Notable for the following:    Neutro Abs 14.5 (*)    All other  components within normal limits  PREALBUMIN - Abnormal; Notable for the following:    Prealbumin 7.9 (*)    All other components within normal limits  COMPREHENSIVE METABOLIC PANEL - Abnormal; Notable for the following:    Glucose, Bld 163 (*)    BUN 30 (*)    Calcium 8.2 (*)    Total Protein 5.7 (*)    Albumin 1.8 (*)    Alkaline Phosphatase 157 (*)    Anion gap 4 (*)    All other components within normal limits  GLUCOSE, CAPILLARY - Abnormal; Notable for the following:    Glucose-Capillary 169 (*)    All other components within normal limits  GLUCOSE, CAPILLARY - Abnormal; Notable for the following:    Glucose-Capillary 166 (*)    All other components within normal limits  GLUCOSE, CAPILLARY - Abnormal; Notable for the following:    Glucose-Capillary 147 (*)    All other components within normal limits  GLUCOSE, CAPILLARY - Abnormal; Notable for the following:    Glucose-Capillary 153 (*)    All other components within normal limits  GLUCOSE, CAPILLARY - Abnormal; Notable for the following:    Glucose-Capillary 169 (*)    All other components within normal limits  GLUCOSE, CAPILLARY - Abnormal; Notable for the following:    Glucose-Capillary 155 (*)    All other components within normal limits  GLUCOSE, CAPILLARY - Abnormal; Notable for the following:    Glucose-Capillary 125 (*)    All other components within normal limits  GLUCOSE, CAPILLARY - Abnormal; Notable for the following:    Glucose-Capillary 212 (*)    All other components within normal limits  GLUCOSE, CAPILLARY - Abnormal; Notable for the following:    Glucose-Capillary 187 (*)    All other components within normal limits  GLUCOSE, CAPILLARY - Abnormal; Notable for the following:    Glucose-Capillary 186 (*)    All other components within normal limits  GLUCOSE, CAPILLARY - Abnormal; Notable for the following:    Glucose-Capillary 167 (*)    All other components within normal limits  PROTIME-INR -  Abnormal; Notable for the following:    Prothrombin Time 17.1 (*)    All other components within normal limits  APTT - Abnormal; Notable for the following:    aPTT 39 (*)    All other components within normal limits  GLUCOSE, CAPILLARY - Abnormal; Notable for the following:    Glucose-Capillary 155 (*)    All other components within normal limits  CBC - Abnormal; Notable for the following:    WBC 14.7 (*)    RBC 2.89 (*)    Hemoglobin 7.4 (*)    HCT 24.7 (*)    MCH 25.6 (*)    RDW 16.7 (*)  All other components within normal limits  GLUCOSE, CAPILLARY - Abnormal; Notable for the following:    Glucose-Capillary 131 (*)    All other components within normal limits  GLUCOSE, CAPILLARY - Abnormal; Notable for the following:    Glucose-Capillary 179 (*)    All other components within normal limits  BASIC METABOLIC PANEL - Abnormal; Notable for the following:    CO2 33 (*)    Glucose, Bld 188 (*)    BUN 41 (*)    Calcium 8.3 (*)    All other components within normal limits  GLUCOSE, CAPILLARY - Abnormal; Notable for the following:    Glucose-Capillary 216 (*)    All other components within normal limits  GLUCOSE, CAPILLARY - Abnormal; Notable for the following:    Glucose-Capillary 175 (*)    All other components within normal limits  GLUCOSE, CAPILLARY - Abnormal; Notable for the following:    Glucose-Capillary 160 (*)    All other components within normal limits  I-STAT ARTERIAL BLOOD GAS, ED - Abnormal; Notable for the following:    pH, Arterial 7.475 (*)    Bicarbonate 31.8 (*)    Acid-Base Excess 8.0 (*)    All other components within normal limits  URINE CULTURE  MRSA PCR SCREENING  CULTURE, BLOOD (ROUTINE X 2)  CULTURE, BLOOD (ROUTINE X 2)  CULTURE, BLOOD (ROUTINE X 2)  CULTURE, BLOOD (ROUTINE X 2)  CULTURE, BLOOD (ROUTINE X 2)  CULTURE, BLOOD (ROUTINE X 2)  PROCALCITONIN  HIV ANTIBODY (ROUTINE TESTING)  STREP PNEUMONIAE URINARY ANTIGEN  MAGNESIUM  PHOSPHORUS   LEGIONELLA PNEUMOPHILA SEROGP 1 UR AG  MAGNESIUM  PHOSPHORUS  DIFFERENTIAL  LACTIC ACID, PLASMA  TRIGLYCERIDES  GLUCOSE, CAPILLARY  MAGNESIUM  PHOSPHORUS  GLUCOSE, CAPILLARY  GLUCOSE, CAPILLARY  MAGNESIUM  PHOSPHORUS  MISC [facility] TEST (SEND OUT)  MISC [facility] TEST (SEND OUT)  PHOSPHORUS  DIFFERENTIAL  TRIGLYCERIDES  MAGNESIUM  MAGNESIUM  PHOSPHORUS  GLUCOSE, CAPILLARY  MAGNESIUM  GLUCOSE, CAPILLARY  GLUCOSE, CAPILLARY  GLUCOSE, CAPILLARY  GLUCOSE, CAPILLARY  GLUCOSE, CAPILLARY  OSMOLALITY, URINE  SODIUM, URINE, RANDOM  CREATININE, URINE, RANDOM  GLUCOSE, CAPILLARY  GLUCOSE, CAPILLARY  GLUCOSE, CAPILLARY  PHOSPHORUS  DIFFERENTIAL  TRIGLYCERIDES  CEA  CANCER ANTIGEN 19-9  MAGNESIUM  PHOSPHORUS  MAGNESIUM  PHOSPHORUS  GLUCOSE, CAPILLARY  PHOSPHORUS  GLUCOSE, CAPILLARY  PHOSPHORUS  MAGNESIUM  PHOSPHORUS  TRIGLYCERIDES  PHOSPHORUS  MAGNESIUM  GLUCOSE, CAPILLARY  STREP PNEUMONIAE URINARY ANTIGEN  LEGIONELLA PNEUMOPHILA SEROGP 1 UR AG  MAGNESIUM  PHOSPHORUS  TRIGLYCERIDES  I-STAT CG4 LACTIC ACID, ED  TYPE AND SCREEN  ABO/RH  PREPARE RBC (CROSSMATCH)  TYPE AND SCREEN  SURGICAL PATHOLOGY    EKG  EKG Interpretation  Date/Time:  Tuesday May 15 2016 10:35:19 EDT Ventricular Rate:  107 PR Interval:    QRS Duration: 76 QT Interval:  376 QTC Calculation: 502 R Axis:   80 Text Interpretation:  Sinus tachycardia Anterior infarct, old Prolonged QT interval No acute changes Confirmed by [name], MD, [facility] [phone]) on 05/14/2016 1:56:03 PM       Radiology No results found.  Procedures Procedures (including critical care time)  Medications Ordered in ED Medications  0.9 %  sodium chloride infusion ( Intravenous Rate/Dose Change 05/16/16 1740)  TPN (CLINIMIX-E) Adult ( Intravenous Stopped 05/01/2016 1735)    And  fat emulsion 20 % infusion 192 mL (0 mLs Intravenous Stopped 05/08/2016 1735)  0.9 %  sodium chloride infusion ( Intravenous New  Bag/Given 05/10/2016 0117)  .TPN (CLINIMIX-E) Adult (  Intravenous Stopped 05/18/16 1755)    And  fat emulsion 20 % infusion 240 mL (0 mLs Intravenous Stopped 05/18/16 1755)  potassium chloride 10 mEq in 50 mL *CENTRAL LINE* IVPB (10 mEq Intravenous Given 05/01/2016 1445)  TPN (CLINIMIX-E) Adult ( Intravenous Stopped 05/19/16 1746)    And  fat emulsion 20 % infusion 240 mL (0 mLs Intravenous Stopped 05/19/16 1746)  TPN (CLINIMIX-E) Adult ( Intravenous Stopped 05/20/16 1754)    And  fat emulsion 20 % infusion 240 mL (0 mLs Intravenous Stopped 05/20/16 1754)  TPN (CLINIMIX-E) Adult ( Intravenous Stopped 05/21/16 1746)    And  fat emulsion 20 % infusion 240 mL (0 mLs Intravenous Stopped 05/21/16 1746)  TPN (CLINIMIX-E) Adult ( Intravenous New Bag/Given 05/23/16 1700)    And  fat emulsion 20 % infusion 240 mL (240 mLs Intravenous New Bag/Given 05/23/16 1700)  TPN (CLINIMIX-E) Adult ( Intravenous Stopped 05/24/16 0536)    And  fat emulsion 20 % infusion 240 mL (0 mLs Intravenous Stopped 05/24/16 0536)  TPN (CLINIMIX-E) Adult ( Intravenous New Bag/Given 05/23/16 1805)    And  fat emulsion 20 % infusion 240 mL (240 mLs Intravenous New Bag/Given 05/23/16 1800)  TPN (CLINIMIX-E) Adult ( Intravenous Stopped 05/25/16 1747)    And  fat emulsion 20 % infusion 240 mL (0 mLs Intravenous Stopped 05/25/16 1747)  TPN (CLINIMIX-E) Adult ( Intravenous Stopped 05/26/16 1744)    And  fat emulsion 20 % infusion 240 mL (0 mLs Intravenous Stopped 05/26/16 1744)  TPN (CLINIMIX-E) Adult ( Intravenous Stopped 05/27/16 1758)    And  fat emulsion 20 % infusion 240 mL (0 mLs Intravenous Stopped 05/27/16 1758)  TPN (CLINIMIX-E) Adult ( Intravenous Rate/Dose Verify 05/28/16 1600)    And  fat emulsion 20 % infusion 240 mL (240 mLs Intravenous Rate/Dose Verify 05/28/16 1600)  TPN (CLINIMIX-E) Adult ( Intravenous Stopped 05/29/16 1759)    And  fat emulsion 20 % infusion 240 mL (0 mLs Intravenous Stopped 05/29/16 1800)  TPN (CLINIMIX-E) Adult ( Intravenous  Stopped 05/30/16 1706)    And  fat emulsion 20 % infusion 240 mL (0 mLs Intravenous Stopped 05/30/16 1706)  .TPN (CLINIMIX-E) Adult ( Intravenous Stopped 05/31/16 1211)    And  fat emulsion 20 % infusion 234 mL (0 mLs Intravenous Stopped 05/31/16 1212)  .TPN (CLINIMIX-E) Adult ( Intravenous Stopped 06/01/16 1457)    And  fat emulsion 20 % infusion 234 mL (0 mLs Intravenous Stopped 06/01/16 1457)  TPN (CLINIMIX-E) Adult ( Intravenous Stopped 06/03/16 1217)    And  fat emulsion 20 % infusion 234 mL (0 mLs Intravenous Stopped 06/03/16 1217)  TPN (CLINIMIX-E) Adult ( Intravenous Stopped 06/02/16 1147)    And  fat emulsion 20 % infusion 234 mL (0 mLs Intravenous Stopped 06/02/16 1147)  TPN (CLINIMIX-E) Adult ( Intravenous Stopped 06/04/16 1204)    And  fat emulsion 20 % infusion 234 mL (0 mLs Intravenous Stopped 06/04/16 1204)  TPN (CLINIMIX-E) Adult ( Intravenous Stopped 06/05/16 1231)    And  fat emulsion 20 % infusion 234 mL (0 mLs Intravenous Stopped 06/05/16 1231)  TPN (CLINIMIX-E) Adult ( Intravenous Stopped 06/06/16 1241)    And  fat emulsion 20 % infusion 234 mL (0 mLs Intravenous Stopped 06/06/16 1241)  TPN (CLINIMIX-E) Adult ( Intravenous Stopped 06/07/16 1754)    And  fat emulsion 20 % infusion 234 mL (0 mLs Intravenous Stopped 06/07/16 1200)  TPN (CLINIMIX-E) Adult ( Intravenous Stopped 06/08/16 1755)    And  fat emulsion 20 % infusion 234 mL (0 mLs Intravenous Stopped 06/08/16 1755)  TPN (CLINIMIX-E) Adult ( Intravenous Stopped 06/09/16 1241)    And  fat emulsion 20 % infusion 234 mL (0 mLs Intravenous Stopped 06/09/16 1241)  TPN (CLINIMIX-E) Adult ( Intravenous Stopped 06/10/16 1200)    And  fat emulsion 20 % infusion 234 mL (0 mLs Intravenous Stopped 06/10/16 1200)  TPN (CLINIMIX-E) Adult ( Intravenous Stopped 06/11/16 1141)    And  fat emulsion 20 % infusion 234 mL (0 mLs Intravenous Stopped 06/11/16 1141)  TPN (CLINIMIX-E) Adult ( Intravenous Stopped 06/12/16 1246)    And  fat emulsion 20 %  infusion 234 mL (0 mLs Intravenous Stopped 06/12/16 1108)  sodium chloride 0.9 % bolus 1,000 mL (0 mLs Intravenous Stopped 05/08/2016 1222)  vancomycin (VANCOCIN) IVPB 1000 mg/200 mL premix (0 mg Intravenous Stopped 04/22/2016 1525)  potassium chloride 10 mEq in 100 mL IVPB (10 mEq Intravenous Given 05/16/16 0116)  magnesium sulfate IVPB 2 g 50 mL (2 g Intravenous Transfusing/Transfer 05/03/2016 1839)  acetaminophen (TYLENOL) suppository 650 mg (650 mg Rectal Given 05/04/2016 1834)  white petrolatum (VASELINE) gel (1 application  Given 0000000 1344)  magnesium sulfate IVPB 2 g 50 mL (2 g Intravenous Given 05/11/2016 1554)  sodium phosphate 10 mmol in sodium chloride 0.9 % 250 mL infusion (10 mmol Intravenous Given 05/08/2016 1130)  anidulafungin ([name]) 200 mg in sodium chloride 0.9 % 200 mL IVPB (200 mg Intravenous Given 05/03/2016 1703)  anidulafungin ([name]) 100 mg in sodium chloride 0.9 % 100 mL IVPB (100 mg Intravenous Given 05/30/16 1728)  potassium chloride 10 mEq in 50 mL *CENTRAL LINE* IVPB (10 mEq Intravenous Given 05/18/16 1400)  potassium chloride 10 mEq in 50 mL *CENTRAL LINE* IVPB (10 mEq Intravenous Given 05/20/16 1438)  0.9 %  sodium chloride infusion ( Intravenous New Bag/Given 05/20/16 1820)  magnesium sulfate IVPB 2 g 50 mL (2 g Intravenous Given 05/21/16 0410)  furosemide (LASIX) injection 40 mg (40 mg Intravenous Given 05/21/16 1600)  magnesium sulfate IVPB 4 g 100 mL (4 g Intravenous Given 05/22/16 1103)  lidocaine (XYLOCAINE) 2 % viscous mouth solution 15 mL (10 mLs Mouth/Throat Given 05/22/16 1024)  diatrizoate meglumine-sodium (GASTROGRAFIN) 66-10 % solution 30 mL (20 mLs Oral Given 05/22/16 1018)  alteplase (CATHFLO ACTIVASE) injection 2 mg (2 mg Intracatheter Given 05/23/16 0545)  potassium chloride 10 mEq in 100 mL IVPB (10 mEq Intravenous Given 05/23/16 1145)  magnesium sulfate IVPB 2 g 50 mL (2 g Intravenous Given 05/24/16 0920)  magnesium sulfate IVPB 2 g 50 mL (2 g Intravenous Given 05/28/16 1411)    furosemide (LASIX) injection 40 mg (40 mg Intravenous Given 05/30/16 1339)  magnesium sulfate 3 g in dextrose 5 % 100 mL IVPB (3 g Intravenous Given 05/31/16 0925)  furosemide (LASIX) injection 40 mg (40 mg Intravenous Given 06/01/16 1157)  LORazepam (ATIVAN) injection 0.5 mg (0.5 mg Intravenous Given 06/01/16 1638)  magnesium sulfate IVPB 2 g 50 mL (2 g Intravenous Given 06/02/16 0945)  potassium chloride 10 mEq in 100 mL IVPB (10 mEq Intravenous Given 06/03/16 1244)  magnesium sulfate IVPB 2 g 50 mL (2 g Intravenous Given 06/03/16 1402)  potassium chloride 10 mEq in 50 mL *CENTRAL LINE* IVPB (10 mEq Intravenous Given 06/04/16 0946)  magnesium sulfate IVPB 2 g 50 mL (2 g Intravenous Given 06/04/16 1139)  white petrolatum (VASELINE) gel (  Given 06/05/16 0928)  potassium chloride 10 mEq in 50 mL *CENTRAL LINE* IVPB (10 mEq  Intravenous Given 06/06/16 1440)  magnesium sulfate 3 g in dextrose 5 % 100 mL IVPB (3 g Intravenous Given 06/07/16 1240)  magnesium sulfate IVPB 2 g 50 mL (2 g Intravenous Given 06/07/16 1149)  potassium chloride 10 mEq in 50 mL *CENTRAL LINE* IVPB (10 mEq Intravenous Given 06/07/16 1841)  promethazine (PHENERGAN) injection 12.5 mg (12.5 mg Intravenous Given 06/08/16 1553)  sodium chloride 0.9 % bolus 500 mL (500 mLs Intravenous Given 06/09/16 1407)  furosemide (LASIX) injection 60 mg (60 mg Intravenous Given 2016-06-23 0835)  LORazepam (ATIVAN) injection 2 mg (2 mg Intravenous Given 06-23-16 1053)     Initial Impression / Assessment and Plan / ED Course  I have reviewed the triage vital signs and the nursing notes.  Pertinent labs & imaging results that were available during my care of the patient were reviewed by me and considered in my medical decision making (see chart for details).  Clinical Course   Pt comes in with cc of confusion, fevers. She is at a nursing home. Multiple medical comorbidities. She is noted to have SIRs here. Likely source is to be aspiration PNA, though  Xrays improved, pt will have nouts of hypoxia when asleep. PE also considered in the ddx but with fevers and other SIRs - infectious etiology more likely. Will admit. Broad spectrum antibiotics given in the ER.   Final Clinical Impressions(s) / ED Diagnoses   Final diagnoses:  Sepsis, due to unspecified organism ([place])  Sepsis ([place])  S/P PICC central line placement    New Prescriptions Discharge Medication List as of 06/23/16  2:03 PM       Varney Biles, MD 05/16/2016 0000    Varney Biles, MD 07/09/16 [phone]

## 2016-05-15 NOTE — ED Notes (Signed)
MD [name] does not recommend antibiotics at this time. Will draw sepsis labs and will reassess. Recommends no more than 1L of NS at this time.

## 2016-05-15 NOTE — ED Triage Notes (Signed)
Per EMS - pt from [facility]. Pt on 2 IV abx at facility, unknown as to whether prophylaxis for PICC line placed yesterday or if for infection. Unable to draw off PICC line this morning. Pt more confused today. Temporal temperature 105.4. ST, hr 120bpm. 93-94% RA, 98% on 2L. RR 28. Thready radial pulses. Pitting edema in extremities. Hx diabetes, cbg 355.

## 2016-05-15 NOTE — H&P (Signed)
History and Physical    [name] [name] [id number] DOB: 1935-07-02 DOA: 04/19/2016  PCP: [name], MD Patient coming from: SNF  Chief Complaint: SOB, Fever, and confusion  HPI: [name] [name] is a 81 y.o. female with medical history significant of degenerative disc disease, diabetes, diverticulosis, GERD, hiatal hernia, HLD, HTN, chronic hyponatremia recently admitted from 04/14/2016 until 04/27/2016 for treatment of acute GI bleed with gastric outlet obstruction, necrotic esophagitis with superficial fungal infection and ulceration with subsequent development of possible right-sided pneumonitis and aspiration pneumonia. Patient was discharged on Unasyn and completed her course on 05/14/2016.[name] Patient with left arm PICC in place since time of discharge for Unasyn and TPN administration. Patient's only complaint at this point time is shortness of breath and fevers. Denies worsening lower extremity edema, chest pain, palpitations, dysuria. Patient unable to provide further history.  Of note patient presenting in acute encephalopathic state and unable to provide reliable history. History provided by patient to a limited basis, limited nursing home notes, EMS and EDP reports.  ED Course: Objective findings outlined below.  Review of Systems: As per HPI otherwise 10 point review of systems negative.   Ambulatory Status: minimal   Past Medical History:  Diagnosis Date  . DDD (degenerative disc disease), lumbar   . Diabetes mellitus   . Diverticulosis   . Endocervical polyp   . Endometrial polyp   . GERD (gastroesophageal reflux disease)   . Hiatal hernia   . Hx of adenomatous colonic polyps   . Hyperlipidemia   . Hypertension   . Hyponatremia     Past Surgical History:  Procedure Laterality Date  . CHOLECYSTECTOMY    . DILATION AND CURETTAGE OF UTERUS    . ESOPHAGOGASTRODUODENOSCOPY (EGD) WITH PROPOFOL N/A 04/17/2016   Procedure: ESOPHAGOGASTRODUODENOSCOPY (EGD) WITH  PROPOFOL;  Surgeon: Manus Gunning, MD;  Location: [facility];  Service: Gastroenterology;  Laterality: N/A;  . HYSTEROSCOPY    . TONSILLECTOMY      Social History   Social History  . Marital status: Widowed    Spouse name: N/A  . Number of children: 1  . Years of education: N/A   Occupational History  . retired Retired   Social History Main Topics  . Smoking status: Never Smoker  . Smokeless tobacco: Never Used  . Alcohol use No  . Drug use: No  . Sexual activity: No   Other Topics Concern  . Not on file   Social History Narrative  . No narrative on file    Allergies  Allergen Reactions  . Ace Inhibitors Cough  . Prednisone     REACTION: lethargic  . Sitagliptin     Other reaction(s): Unknown    Family History  Problem Relation Age of Onset  . Hypertension Mother   . Stroke Mother   . Diabetes Sister     x 2  . Lung cancer Brother     mets  . Alcohol abuse Father   . Breast cancer Cousin     Mat. 1st cousin-Age 27    Prior to Admission medications   Medication Sig Start Date End Date Taking? Authorizing Provider  acetaminophen (TYLENOL) 325 MG tablet Take 325 mg by mouth every 6 (six) hours as needed for moderate pain or headache.     Historical Provider, MD  albuterol (PROVENTIL) (2.5 MG/3ML) 0.083% nebulizer solution Take 3 mLs (2.5 mg total) by nebulization every 6 (six) hours as needed for wheezing or shortness of breath. 04/27/16   Christina  P Rama, MD  ampicillin-sulbactam 1.5 g in sodium chloride 0.9 % 50 mL Inject 1.5 g into the vein every 6 (six) hours. 04/27/16 05/14/17  [name], MD  antiseptic oral rinse (CPC / CETYLPYRIDINIUM CHLORIDE 0.05%) 0.05 % LIQD solution 7 mLs by Mouth Rinse route 2 times daily at 12 noon and 4 pm. 04/27/16   [name], MD  bisacodyl (DULCOLAX) 10 MG suppository Place 1 suppository (10 mg total) rectally daily as needed for moderate constipation. 04/27/16   [name], MD  chlorhexidine  (PERIDEX) 0.12 % solution 15 mLs by Mouth Rinse route 2 (two) times daily. 04/27/16   [name], MD  erythromycin ophthalmic ointment Place into both eyes at bedtime. 04/27/16   [name], MD  fluconazole (DIFLUCAN) 200-0.9 MG/100ML-% IVPB Inject 100 mLs (200 mg total) into the vein daily. 04/27/16 04/17/17  [name], MD  insulin aspart (NOVOLOG) 100 UNIT/ML injection Inject 0-20 Units into the skin every 4 (four) hours. 04/27/16   [name], MD  ondansetron (ZOFRAN) 4 MG/2ML SOLN injection Inject 2 mLs (4 mg total) into the vein every 6 (six) hours as needed for nausea. 04/27/16   [name], MD  pantoprazole (PROTONIX) 40 MG injection Inject 40 mg into the vein every 12 (twelve) hours. 04/27/16   [name], MD  Potassium Chloride in NaCl (0.9 % NACL WITH KCL 20 MEQ / L) 20-0.9 MEQ/L-% Inject 30 mL/hr into the vein continuous. 04/27/16   [name], MD  sodium chloride flush (NS) 0.9 % SOLN 10-40 mLs by Intracatheter route as needed (flush). 04/27/16   [name], MD  sodium chloride flush (NS) 0.9 % SOLN Inject 3 mLs into the vein every 12 (twelve) hours. 04/27/16   [name], MD    Physical Exam: Vitals:   05/10/2016 1530 05/04/2016 1545 04/27/2016 1600 04/20/2016 1630  BP: 130/59 120/56 (!) 120/54 (!) 130/54  Pulse: 92 92 87   Resp: 20 19 19 23   Temp:      TempSrc:      SpO2: 100% 100% 100%   Weight:      Height:         General: Resting in bed, mild distress Eyes:  PERRL, EOMI, normal lids, iris ENT: Very dry mucous membranes, normal tongue Neck:  no LAD, masses or thyromegaly Cardiovascular:  RRR, 2/6 systolic murmur, trace . No LE edema.  Respiratory: Decreased breath sounds in the bases with crackles, increased effort, on 2 L nasal cannula Abdomen:  soft, ntnd, NABS Skin:  no rash or induration seen on limited exam Musculoskeletal:  grossly normal tone BUE/BLE, good ROM, no bony abnormality Psychiatric: Patient alert and oriented 1.  Follows basic commands., Fairly somnolent Neurologic:  CN 2-12 grossly intact, moves all extremities in coordinated fashion, sensation intact  Labs on Admission: I have personally reviewed following labs and imaging studies  CBC:  Recent Labs Lab 04/25/2016 1107  WBC 8.7  NEUTROABS 7.8*  HGB 8.4*  HCT 26.8*  MCV 85.6  PLT 0000000   Basic Metabolic Panel:  Recent Labs Lab 05/06/2016 1107  NA 131*  K 2.8*  CL 92*  CO2 30  GLUCOSE 261*  BUN 25*  CREATININE 0.85  CALCIUM 7.8*   GFR: Estimated Creatinine Clearance: 52.4 mL/min (by C-G formula based on SCr of 0.85 mg/dL). Liver Function Tests:  Recent Labs Lab 05/07/2016 1107  AST 40  ALT 26  ALKPHOS 73  BILITOT  0.6  PROT 4.8*  ALBUMIN 1.8*   No results for input(s): LIPASE, AMYLASE in the last 168 hours. No results for input(s): AMMONIA in the last 168 hours. Coagulation Profile:  Recent Labs Lab 05/01/2016 1107  INR 1.38   Cardiac Enzymes: No results for input(s): CKTOTAL, CKMB, CKMBINDEX, TROPONINI in the last 168 hours. BNP (last 3 results) No results for input(s): PROBNP in the last 8760 hours. HbA1C: No results for input(s): HGBA1C in the last 72 hours. CBG: No results for input(s): GLUCAP in the last 168 hours. Lipid Profile: No results for input(s): CHOL, HDL, LDLCALC, TRIG, CHOLHDL, LDLDIRECT in the last 72 hours. Thyroid Function Tests: No results for input(s): TSH, T4TOTAL, FREET4, T3FREE, THYROIDAB in the last 72 hours. Anemia Panel: No results for input(s): VITAMINB12, FOLATE, FERRITIN, TIBC, IRON, RETICCTPCT in the last 72 hours. Urine analysis:    Component Value Date/Time   COLORURINE AMBER (A) 05/10/2016 1300   APPEARANCEUR CLEAR 05/12/2016 1300   LABSPEC 1.029 05/01/2016 1300   PHURINE 6.5 04/28/2016 1300   GLUCOSEU 100 (A) 04/23/2016 1300   HGBUR SMALL (A) 04/21/2016 1300   BILIRUBINUR SMALL (A) 04/25/2016 1300   KETONESUR NEGATIVE 04/24/2016 1300   PROTEINUR 100 (A) 05/03/2016 1300    NITRITE NEGATIVE 04/22/2016 1300   LEUKOCYTESUR NEGATIVE 05/05/2016 1300    Creatinine Clearance: Estimated Creatinine Clearance: 52.4 mL/min (by C-G formula based on SCr of 0.85 mg/dL).  Sepsis Labs: @LABRCNTIP (procalcitonin:4,lacticidven:4) ) Recent Results (from the past 240 hour(s))  Blood Culture (routine x 2)     Status: None (Preliminary result)   Collection Time: 05/07/2016 11:00 AM  Result Value Ref Range Status   Specimen Description BLOOD LEFT HAND  Final   Special Requests BOTTLES DRAWN AEROBIC AND ANAEROBIC  5CC  Final   Culture PENDING  Incomplete   Report Status PENDING  Incomplete     Radiological Exams on Admission: Dg Chest Port 1 View  Result Date: 05/02/2016 CLINICAL DATA:  Code sepsis EXAM: PORTABLE CHEST 1 VIEW COMPARISON:  04/25/2016 FINDINGS: Mild improvement in aeration in the lung bases. Persistent bibasilar atelectasis/ infiltrate and small pleural effusions. Negative for edema Left arm PICC tip pulled back slightly with the tip in the left innominate/SVC junction. IMPRESSION: Improved aeration in the lung bases. Persistent bibasilar airspace disease and small bilateral effusions. Electronically Signed   By: Franchot Gallo M.D.   On: 05/17/2016 11:12    EKG: Independently reviewed.   Assessment/Plan Active Problems:   Esophageal necrosis   Sepsis ([place])   Acute respiratory failure (H[facility])   Acute encephalopathy   On total parenteral nutrition (TPN)   Normocytic anemia   Essential hypertension    Sepsis/acute respiratory failure/AMS: Suspect multiple possible etiologies such as HCAP in conjunction with possible esophageal infection given recent history. Patient febrile at nursing home 104.5, heart rate 107, respiratory rate 24, hypoxemia with O2 saturations down to 83% on room air, WBC 8.7 with left shift, lactic acid 1.87. UA without evidence of acute infection. - Stepdown  - Blood culture, urine culture, PICC line culture  - PICC team consulted for  replacement of PICC - ABG - Vancomycin and cefepime  - consider restarting Diflucan but will defer to GI - GI as below  - IVF  - Nothing by mouth given recent esophageal necrosis and fungal infection  - Sepsis and Pneumonia ordersets utilized   Acute GI bleed/esophageal necrosis/gastric outlet obstruction with bowel ulceration: This was the primary reason for her previous admission. During that time  pt was started on 28 day course of Unasyn and Diflucan for treatment of the necrotic debri and fungal forms noted on biopsy (as well as possible aspiration pneumonia). Of note per review of last GI note during last admission on 04/27/2016 patient was to have a follow-up EGD and possible EUS within 2-3 weeks. - Consult with [place] gastro[facility]ology in the a.m for likely repeat EGD/EUS.  - Continue TPN - IV PPI  Metabolic derangements: Suspect secondary to nutritional loss as patient is on TPN. NA 131, K2.8, CL 92, CA 7.8, total protein 4.8 - TPN per pharmacy - Magnesium, phosphorus - Magnesium 2 g - KCl times total of 70 mEq - BMP every 12  Normocytic anemia: Hemoglobin 8.4. Baseline 12. Recent GI bleed. Current hemoglobin appears stable from previous at time of discharge. No reports of blood loss. INR 1.38 - CBC every 12 - FOBT  HTN: - Hydralazine prn  DM:CBG 261 on admission.  - SSI    DVT prophylaxis: FHep  Code Status: FULL  Family Communication: none  Disposition Plan: pending improvement  Consults called: [facility] by rounding team on 05/16/16 Admission status: inpt SDU    Bular Hickok J MD [facility]  If 7PM-7AM, please contact night-coverage [email or web address] Password [facility]  05/11/2016, 4:56 PM

## 2016-05-15 NOTE — Progress Notes (Signed)
CRITICAL VALUE ALERT  Critical value received:  Troponin 0.06  Date of notification:  04/21/2016  Time of notification:  2101  Critical value read back: yes   Nurse who received alert:  Atilano Ina, RN  MD notified (1st page):  Baltazar Najjar  Time of first page:  2102

## 2016-05-15 NOTE — ED Notes (Signed)
Per report PICC line was placed yesterday, unable to get blood return, [place], MD at bedside attempting US guided IV, this RN unable to gain IV access, Chrislyn, RN unable to gain IV access

## 2016-05-15 NOTE — Progress Notes (Addendum)
Pharmacy Antibiotic Note  [name] [name] is a 81 y.o. female admitted on 05/04/2016 with sepsis.  Pharmacy has been consulted for vancomycin dosing.  Vancomycin 1g ordered by EDP.  Plan: -cefepime 2g IV q12h per MD (dose ok) -fluconazole 100mg  IV q24h cont from PTA -vancomycin 750mg  IV q12h for goal trough of 15-37mcg/mL -follow c/s, clinical progression, LOT, ID consult  Height: 5\' 2"  (157.5 cm) Weight: 170 lb 3.2 oz (77.2 kg) IBW/kg (Calculated) : 50.1  Temp (24hrs), Avg:101.5 F (38.6 C), Min:101.5 F (38.6 C), Max:101.5 F (38.6 C)   Recent Labs Lab 05/17/2016 1107 05/01/2016 1118  WBC 8.7  --   CREATININE 0.85  --   LATICACIDVEN  --  1.87    Estimated Creatinine Clearance: 52.4 mL/min (by C-G formula based on SCr of 0.85 mg/dL).    Allergies  Allergen Reactions  . Ace Inhibitors Cough  . Prednisone     REACTION: lethargic  . Sitagliptin     Other reaction(s): Unknown    Antimicrobials this admission: Cefepime 8/29 >>  Vanc 8/29 >>  Fluconazole (cont from last admission/PTA) 8/4>> Unasyn PTA- completed 28 day course  Dose adjustments this admission: n/a  Microbiology results: 8/29 BCx: sent 8/29 UCx: sent    Thank you for allowing pharmacy to be a part of this patient's care.  Ying Blankenhorn D. Isai Gottlieb, PharmD, BCPS Clinical Pharmacist Pager: [phone] 05/04/2016 2:03 PM

## 2016-05-15 NOTE — Care Management Note (Signed)
Case Management Note  Patient Details  Name: FUTURE HAUS MRN: [id number] Date of Birth: 10/07/1934  Subjective/Objective:                  81 yo female pt from [facility]. Pt on 2 IV abx at facility, unknown as to whether prophylaxis for PICC line placed yesterday or if for infection. Unable to draw off PICC line this morning. Pt more confused today.  Action/Plan: Follow for disposition needs./ Antic[facility]ate [facility].   Expected Discharge Date:  05/19/16               Expected Discharge Plan:  Skilled Nursing Facility  In-House Referral:  Clinical Social Work  Discharge planning Services  CM Consult  Post Acute Care Choice:    Choice offered to:     DME Arranged:    DME Agency:     HH Arranged:    [place] Agency:     Status of Service:  In process, will continue to follow  If discussed at Long Length of Stay Meetings, dates discussed:    Additional Comments:  Fuller Mandril, RN 05/16/2016, 2:59 PM

## 2016-05-16 ENCOUNTER — Inpatient Hospital Stay (HOSPITAL_COMMUNITY): Payer: Medicare Other

## 2016-05-16 ENCOUNTER — Encounter (HOSPITAL_COMMUNITY): Payer: Self-pay | Admitting: Anesthesiology

## 2016-05-16 ENCOUNTER — Telehealth: Payer: Self-pay | Admitting: Gastroenterology

## 2016-05-16 DIAGNOSIS — R1110 Vomiting, unspecified: Secondary | ICD-10-CM

## 2016-05-16 DIAGNOSIS — A419 Sepsis, unspecified organism: Secondary | ICD-10-CM

## 2016-05-16 DIAGNOSIS — Z789 Other specified health status: Secondary | ICD-10-CM

## 2016-05-16 DIAGNOSIS — K311 Adult hypertrophic pyloric stenosis: Secondary | ICD-10-CM

## 2016-05-16 DIAGNOSIS — J9601 Acute respiratory failure with hypoxia: Secondary | ICD-10-CM

## 2016-05-16 LAB — CBC
HCT: 26.9 % — ABNORMAL LOW (ref 36.0–46.0)
HEMOGLOBIN: 8.4 g/dL — AB (ref 12.0–15.0)
MCH: 26.5 pg (ref 26.0–34.0)
MCHC: 31.2 g/dL (ref 30.0–36.0)
MCV: 84.9 fL (ref 78.0–100.0)
Platelets: 142 10*3/uL — ABNORMAL LOW (ref 150–400)
RBC: 3.17 MIL/uL — ABNORMAL LOW (ref 3.87–5.11)
RDW: 14.5 % (ref 11.5–15.5)
WBC: 7.2 10*3/uL (ref 4.0–10.5)

## 2016-05-16 LAB — DIFFERENTIAL
Basophils Absolute: 0 10*3/uL (ref 0.0–0.1)
Basophils Relative: 0 %
EOS ABS: 0.1 10*3/uL (ref 0.0–0.7)
EOS PCT: 1 %
LYMPHS ABS: 0.7 10*3/uL (ref 0.7–4.0)
Lymphocytes Relative: 9 %
MONO ABS: 0.3 10*3/uL (ref 0.1–1.0)
Monocytes Relative: 4 %
NEUTROS PCT: 86 %
Neutro Abs: 6.1 10*3/uL (ref 1.7–7.7)

## 2016-05-16 LAB — URINE CULTURE: Culture: NO GROWTH

## 2016-05-16 LAB — MAGNESIUM: MAGNESIUM: 1.8 mg/dL (ref 1.7–2.4)

## 2016-05-16 LAB — COMPREHENSIVE METABOLIC PANEL
ALK PHOS: 74 U/L (ref 38–126)
ALT: 42 U/L (ref 14–54)
AST: 67 U/L — ABNORMAL HIGH (ref 15–41)
Albumin: 1.6 g/dL — ABNORMAL LOW (ref 3.5–5.0)
Anion gap: 7 (ref 5–15)
BUN: 24 mg/dL — ABNORMAL HIGH (ref 6–20)
CALCIUM: 7.3 mg/dL — AB (ref 8.9–10.3)
CO2: 28 mmol/L (ref 22–32)
CREATININE: 0.76 mg/dL (ref 0.44–1.00)
Chloride: 98 mmol/L — ABNORMAL LOW (ref 101–111)
Glucose, Bld: 102 mg/dL — ABNORMAL HIGH (ref 65–99)
Potassium: 3.6 mmol/L (ref 3.5–5.1)
Sodium: 133 mmol/L — ABNORMAL LOW (ref 135–145)
Total Bilirubin: 0.5 mg/dL (ref 0.3–1.2)
Total Protein: 4.5 g/dL — ABNORMAL LOW (ref 6.5–8.1)

## 2016-05-16 LAB — GLUCOSE, CAPILLARY
GLUCOSE-CAPILLARY: 102 mg/dL — AB (ref 65–99)
GLUCOSE-CAPILLARY: 70 mg/dL (ref 65–99)
GLUCOSE-CAPILLARY: 98 mg/dL (ref 65–99)
Glucose-Capillary: 83 mg/dL (ref 65–99)

## 2016-05-16 LAB — HIV ANTIBODY (ROUTINE TESTING W REFLEX): HIV Screen 4th Generation wRfx: NONREACTIVE

## 2016-05-16 LAB — TRIGLYCERIDES: TRIGLYCERIDES: 104 mg/dL (ref <150)

## 2016-05-16 LAB — PHOSPHORUS: Phosphorus: 2.6 mg/dL (ref 2.5–4.6)

## 2016-05-16 LAB — PREALBUMIN: PREALBUMIN: 3.5 mg/dL — AB (ref 18–38)

## 2016-05-16 LAB — LACTIC ACID, PLASMA: Lactic Acid, Venous: 1.4 mmol/L (ref 0.5–1.9)

## 2016-05-16 LAB — STREP PNEUMONIAE URINARY ANTIGEN: Strep Pneumo Urinary Antigen: NEGATIVE

## 2016-05-16 MED ORDER — INSULIN ASPART 100 UNIT/ML ~~LOC~~ SOLN
0.0000 [IU] | SUBCUTANEOUS | Status: DC
  Administered 2016-05-17 (×2): 5 [IU] via SUBCUTANEOUS
  Administered 2016-05-17: 3 [IU] via SUBCUTANEOUS
  Administered 2016-05-17: 2 [IU] via SUBCUTANEOUS
  Administered 2016-05-17 – 2016-05-18 (×2): 3 [IU] via SUBCUTANEOUS
  Administered 2016-05-18: 5 [IU] via SUBCUTANEOUS
  Administered 2016-05-18 – 2016-05-19 (×4): 3 [IU] via SUBCUTANEOUS
  Administered 2016-05-19 – 2016-05-20 (×5): 2 [IU] via SUBCUTANEOUS
  Administered 2016-05-20 (×2): 3 [IU] via SUBCUTANEOUS
  Administered 2016-05-20: 2 [IU] via SUBCUTANEOUS
  Administered 2016-05-20: 3 [IU] via SUBCUTANEOUS
  Administered 2016-05-20: 2 [IU] via SUBCUTANEOUS
  Administered 2016-05-20 – 2016-05-21 (×2): 3 [IU] via SUBCUTANEOUS
  Administered 2016-05-21 – 2016-05-22 (×5): 2 [IU] via SUBCUTANEOUS
  Administered 2016-05-22 – 2016-05-23 (×2): 3 [IU] via SUBCUTANEOUS
  Administered 2016-05-23: 2 [IU] via SUBCUTANEOUS
  Administered 2016-05-23: 8 [IU] via SUBCUTANEOUS
  Administered 2016-05-23: 0 [IU] via SUBCUTANEOUS
  Administered 2016-05-23: 3 [IU] via SUBCUTANEOUS
  Administered 2016-05-23: 5 [IU] via SUBCUTANEOUS
  Administered 2016-05-24 (×2): 0 [IU] via SUBCUTANEOUS
  Administered 2016-05-24 – 2016-05-28 (×8): 2 [IU] via SUBCUTANEOUS
  Administered 2016-05-28 – 2016-05-29 (×4): 3 [IU] via SUBCUTANEOUS
  Administered 2016-05-29 – 2016-05-30 (×7): 2 [IU] via SUBCUTANEOUS
  Administered 2016-05-31: 3 [IU] via SUBCUTANEOUS
  Administered 2016-05-31: 2 [IU] via SUBCUTANEOUS
  Administered 2016-05-31 (×3): 3 [IU] via SUBCUTANEOUS
  Administered 2016-06-01: 5 [IU] via SUBCUTANEOUS
  Administered 2016-06-01 – 2016-06-02 (×8): 3 [IU] via SUBCUTANEOUS
  Administered 2016-06-02: 2 [IU] via SUBCUTANEOUS
  Administered 2016-06-02: 5 [IU] via SUBCUTANEOUS
  Administered 2016-06-02 – 2016-06-03 (×7): 3 [IU] via SUBCUTANEOUS
  Administered 2016-06-04: 5 [IU] via SUBCUTANEOUS
  Administered 2016-06-04: 3 [IU] via SUBCUTANEOUS
  Administered 2016-06-04: 2 [IU] via SUBCUTANEOUS
  Administered 2016-06-04: 11 [IU] via SUBCUTANEOUS
  Administered 2016-06-04 (×2): 8 [IU] via SUBCUTANEOUS
  Administered 2016-06-05 (×3): 3 [IU] via SUBCUTANEOUS
  Administered 2016-06-05: 2 [IU] via SUBCUTANEOUS
  Administered 2016-06-05: 5 [IU] via SUBCUTANEOUS
  Administered 2016-06-06 (×3): 3 [IU] via SUBCUTANEOUS
  Administered 2016-06-06 – 2016-06-07 (×4): 2 [IU] via SUBCUTANEOUS
  Administered 2016-06-07: 3 [IU] via SUBCUTANEOUS
  Administered 2016-06-07: 2 [IU] via SUBCUTANEOUS
  Administered 2016-06-08: 3 [IU] via SUBCUTANEOUS
  Administered 2016-06-08: 2 [IU] via SUBCUTANEOUS
  Administered 2016-06-08 (×2): 3 [IU] via SUBCUTANEOUS
  Administered 2016-06-08: 2 [IU] via SUBCUTANEOUS
  Administered 2016-06-09 (×4): 3 [IU] via SUBCUTANEOUS
  Administered 2016-06-09 (×2): 2 [IU] via SUBCUTANEOUS
  Administered 2016-06-10: 3 [IU] via SUBCUTANEOUS
  Administered 2016-06-10: 2 [IU] via SUBCUTANEOUS
  Administered 2016-06-10: 3 [IU] via SUBCUTANEOUS
  Administered 2016-06-10 – 2016-06-11 (×3): 2 [IU] via SUBCUTANEOUS
  Administered 2016-06-11 (×2): 3 [IU] via SUBCUTANEOUS
  Administered 2016-06-11: 5 [IU] via SUBCUTANEOUS
  Administered 2016-06-11 – 2016-06-12 (×3): 3 [IU] via SUBCUTANEOUS
  Administered 2016-06-12: 2 [IU] via SUBCUTANEOUS
  Administered 2016-06-12 (×3): 3 [IU] via SUBCUTANEOUS
  Administered 2016-06-13: 5 [IU] via SUBCUTANEOUS
  Administered 2016-06-13 (×2): 3 [IU] via SUBCUTANEOUS

## 2016-05-16 MED ORDER — FAT EMULSION 20 % IV EMUL
192.0000 mL | INTRAVENOUS | Status: AC
  Administered 2016-05-16: 192 mL via INTRAVENOUS
  Filled 2016-05-16: qty 250

## 2016-05-16 MED ORDER — SODIUM CHLORIDE 0.9 % IV SOLN
INTRAVENOUS | Status: AC
  Administered 2016-05-17: 01:00:00 via INTRAVENOUS

## 2016-05-16 MED ORDER — TRACE MINERALS CR-CU-MN-SE-ZN 10-1000-500-60 MCG/ML IV SOLN
INTRAVENOUS | Status: AC
  Administered 2016-05-16: 17:00:00 via INTRAVENOUS
  Filled 2016-05-16: qty 1440

## 2016-05-16 MED ORDER — ALBUTEROL SULFATE (2.5 MG/3ML) 0.083% IN NEBU: 2.5000 mg | INHALATION_SOLUTION | RESPIRATORY_TRACT | Status: DC | PRN

## 2016-05-16 MED ORDER — SODIUM CHLORIDE 0.9 % IV SOLN: INTRAVENOUS | Status: DC

## 2016-05-16 MED ORDER — WHITE PETROLATUM GEL
Status: AC
  Administered 2016-05-16: 1
  Filled 2016-05-16: qty 1

## 2016-05-16 MED ORDER — ACETAMINOPHEN 650 MG RE SUPP
650.0000 mg | RECTAL | Status: DC | PRN
  Administered 2016-05-29 – 2016-06-09 (×3): 650 mg via RECTAL
  Filled 2016-05-16 (×3): qty 1

## 2016-05-16 NOTE — Consult Note (Signed)
[facility] Consult: 10:16 AM 05/16/2016  LOS: 1 day    Referring Provider: Dr Thereasa Solo  Primary Care Physician:  [name], MD Primary Gastroenterologist:  Dr. Havery Moros     Reason for Consultation:  Sepsis.     HPI: [name] [name] is a 81 y.o. female.  Hx DDD.  IDDM.  Chronic hyponatremia.  GERD, HH. 2011 Colonoscopy: diverticulosis, no recurrent adenomatous polyps.  Admission 7/29 - 04/27/16 for GI bleed, GOO. Pneumonitis, aspiration PNA.     04/17/16 EGD for anemia, melena.  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 Pathology showed fungal elements in esophagus, HP gastric polyp, gastritis, duodenitis.  04/22/16 EGD for follow up.  Slightly improved esophageal appearance but persistent, widespread ulcerated, necrotic appearance.  Lumen narrower, Dr Havery Moros suspected early stricture.    Non-bleeding GU without stigmata.  Pyloric stenosis due to gastric mass.  Improved appearence of duodenal ulcers. Pathology on gastric polyp was hyperplastic.  CT 8/1.  ? Nodular liver. Thickened edematous esophagus, edematous stomach.  Colon diverticulosis. Minor abdominal ascites.  Required 1 PRBC.  Hgb nadir 7.9, 9.0 at discharge Discharged to SNF with PICC for home TPN, Unasyn, Diflucan (completed), BID IV Protonix (later switched to po BID) . Limited clears being consumed at discharge. At SNF, has essentially been NPO except for meds.  Pt is set up for EUS tomorrow 8/31 0730 at [facility].    Returned to ED yesterday 7/29  with confusion.  C/o dyspnea. Ruled in for sepsis, vs SIRS, hypoxic resp failure.   She tells me that she has not been getting po, not event ice chips at SNF.  However has po meds, including Protonix, listed.  No abdominal  pain.  Occasional nausea.  + yellow, mucoid cough.  No issues with bleeding or bruising.  Bed and wheelchair bound. Np bloody stools   BUN is elevated to 25 but GFR >60.  AST/ALT is 67/42.  T bili and alk phos normal.   Hgb 8.4.  WBCs normal.  Platelets 142.   Prealbumin 3.5.   Troponin # 1 is 0.06.  Chest films: aeration improved, persisent bil infiltrates and effusions.  PICC tip in left innominate/SVC junction.       Past Medical History:  Diagnosis Date  . DDD (degenerative disc disease), lumbar   . Diabetes mellitus   . Diverticulosis   . Endocervical polyp   . Endometrial polyp   . GERD (gastroesophageal reflux disease)   . Hiatal hernia   . Hx of adenomatous colonic polyps   . Hyperlipidemia   . Hypertension   . Hyponatremia     Past Surgical History:  Procedure Laterality Date  . CHOLECYSTECTOMY    . DILATION AND CURETTAGE OF UTERUS    . ESOPHAGOGASTRODUODENOSCOPY (EGD) WITH PROPOFOL N/A 04/17/2016   Procedure: ESOPHAGOGASTRODUODENOSCOPY (EGD) WITH PROPOFOL;  Surgeon: Manus Gunning, MD;  Location: [facility];  Service: Gastroenterology;  Laterality: N/A;  . HYSTEROSCOPY    . TONSILLECTOMY      Prior to Admission  medications   Medication Sig Start Date End Date Taking? Authorizing Provider  acetaminophen (TYLENOL) 325 MG tablet Take 325 mg by mouth every 6 (six) hours as needed for moderate pain or headache.    Yes Historical Provider, MD  ADULT TPN Inject into the vein See admin instructions. With lipids to run at 70 ml's/hr continuous   Yes Historical Provider, MD  albuterol (PROVENTIL) (2.5 MG/3ML) 0.083% nebulizer solution Take 3 mLs (2.5 mg total) by nebulization every 6 (six) hours as needed for wheezing or shortness of breath. 04/27/16  Yes [name], MD  alteplase (CATHFLO ACTIVASE) 2 MG injection 2 mg by Intracatheter route once as needed for open catheter.   Yes Historical Provider, MD  bisacodyl (DULCOLAX) 10 MG suppository Place 1 suppository  (10 mg total) rectally daily as needed for moderate constipation. Patient taking differently: Place 10 mg rectally daily as needed (for constipation).  04/27/16  Yes [name], MD  dextrose 10 % infusion Inject into the vein See admin instructions. "68 ml's/ hr as needed for supplement dextrose 10% hang if there is any interruption of TPN"   Yes Historical Provider, MD  insulin aspart (NOVOLOG) 100 UNIT/ML injection Inject 0-20 Units into the skin every 4 (four) hours. Patient taking differently: Inject 0-10 Units into the skin 4 (four) times daily -  before meals and at bedtime. Per sliding scale: BGL 0-150 = 0 units; 151-200 = 2 units; 201-250 = 4 units; 251-300 = 6 units; 301-350 = 8 units; 351-400 = 10 units; >401 = NOTIFY THE DOCTOR 04/27/16  Yes [name], MD  Insulin Glargine (LANTUS SOLOSTAR) 100 UNIT/ML Solostar Pen Inject 16 Units into the skin every morning.   Yes Historical Provider, MD  Multiple Vitamin (MULTIVITAMIN) LIQD Take 10 mLs by mouth daily.   Yes Historical Provider, MD  ondansetron (ZOFRAN) 4 MG tablet Take 4 mg by mouth See admin instructions. Every six to eight hours as needed for nausea or vomiting   Yes Historical Provider, MD  pantoprazole (PROTONIX) 40 MG tablet Take 40 mg by mouth 2 (two) times daily.   Yes Historical Provider, MD  promethazine 25 mg in sodium chloride 0.9 % 1,000 mL Inject 12.5 mg into the vein every 6 (six) hours as needed (for nausea).   Yes Historical Provider, MD  ampicillin-sulbactam 1.5 g in sodium chloride 0.9 % 50 mL Inject 1.5 g into the vein every 6 (six) hours. 04/27/16 05/14/17  [name], MD  antiseptic oral rinse (CPC / CETYLPYRIDINIUM CHLORIDE 0.05%) 0.05 % LIQD solution 7 mLs by Mouth Rinse route 2 times daily at 12 noon and 4 pm. 04/27/16   [name], MD  chlorhexidine (PERIDEX) 0.12 % solution 15 mLs by Mouth Rinse route 2 (two) times daily. 04/27/16   [name], MD  erythromycin ophthalmic ointment Place  into both eyes at bedtime. 04/27/16   [name], MD  fluconazole (DIFLUCAN) 200-0.9 MG/100ML-% IVPB Inject 100 mLs (200 mg total) into the vein daily. 04/27/16 04/17/17  [name], MD  ondansetron (ZOFRAN) 4 MG/2ML SOLN injection Inject 2 mLs (4 mg total) into the vein every 6 (six) hours as needed for nausea. 04/27/16   [name], MD  pantoprazole (PROTONIX) 40 MG injection Inject 40 mg into the vein every 12 (twelve) hours. 04/27/16   [name], MD  Potassium Chloride in NaCl (0.9 % NACL WITH KCL 20 MEQ / L) 20-0.9 MEQ/L-% Inject 30 mL/hr into  the vein continuous. 04/27/16   [name], MD  sodium chloride flush (NS) 0.9 % SOLN 10-40 mLs by Intracatheter route as needed (flush). 04/27/16   [name], MD  sodium chloride flush (NS) 0.9 % SOLN Inject 3 mLs into the vein every 12 (twelve) hours. 04/27/16   [name], MD    Scheduled Meds: . antiseptic oral rinse  7 mL Mouth Rinse q12n4p  . ceFEPime (MAXIPIME) IV  2 g Intravenous Q12H  . chlorhexidine  15 mL Mouth Rinse BID  . heparin  5,000 Units Subcutaneous Q8H  . insulin aspart  0-5 Units Subcutaneous QHS  . insulin aspart  0-9 Units Subcutaneous TID WC  . pantoprazole  40 mg Intravenous Q12H  . sodium chloride flush  3 mL Intravenous Q12H  . vancomycin  750 mg Intravenous Q12H   Infusions: . sodium chloride 100 mL/hr at 05/16/16 0420   PRN Meds: acetaminophen, albuterol, hydrALAZINE, ondansetron   Allergies as of 04/30/2016 - Review Complete 04/23/2016  Allergen Reaction Noted  . Ace inhibitors Cough 04/06/2011  . Prednisone Other (See Comments) 01/11/2010  . Sitagliptin Other (See Comments) 01/03/2016    Family History  Problem Relation Age of Onset  . Hypertension Mother   . Stroke Mother   . Diabetes Sister     x 2  . Lung cancer Brother     mets  . Alcohol abuse Father   . Breast cancer Cousin     Mat. 1st cousin-Age 93    Social History   Social History  . Marital  status: Widowed    Spouse name: N/A  . Number of children: 1  . Years of education: N/A   Occupational History  . retired Retired   Social History Main Topics  . Smoking status: Never Smoker  . Smokeless tobacco: Never Used  . Alcohol use No  . Drug use: No  . Sexual activity: No   Other Topics Concern  . Not on file   Social History Narrative  . No narrative on file    REVIEW OF SYSTEMS: Constitutional:  Weakness is chronic ENT:  No nose bleeds Pulm:  Per HPI CV:  No palpitations, no LE edema.  GU:  No hematuria, no frequency GI:  Per HPI Heme:  No unusual bleeding.  Bruises easily   Transfusions:  Per HPI Neuro:  No headaches, no peripheral tingling or numbness Derm:  No itching, no rash or sores.  Endocrine:  No sweats or chills.  No polyuria or dysuria Immunization:  Not queried Travel:  None beyond local counties in last few months.    PHYSICAL EXAM: Vital signs in last 24 hours: Vitals:   05/16/16 0427 05/16/16 0903  BP: 107/68 (!) 104/46  Pulse: 80   Resp: (!) 22   Temp: 98.4 F (36.9 C) 98.7 F (37.1 C)   Wt Readings from Last 3 Encounters:  05/17/2016 82.7 kg (182 lb 5.1 oz)  04/27/16 81 kg (178 lb 9.2 oz)  01/20/16 78.5 kg (173 lb)    General: obese, acutely ill and looks in very poor health Head:  + edema.    Eyes:  Conjunctiva pink.  Ears:  Not HOH  Nose:  No discharge Mouth:  Dry MM and tongue, rust discoloration on tongue Neck:  No mass or JVD Lungs:  + cough.  Poor BS bil but clear.  No labored resps.  Heart: RRR.  No mrg.  S1, s2 present.   Abdomen:  Obese, soft,  NT, ND.  No mrg.   Rectal: deferred   Musc/Skeltl: no joint swelling or redness Extremities:  Edematous UE with purpura.  Just mild LE edema  Neurologic:  Oriented to 2018, [place], self.  Appropriate.  Moves all 4s without tremor.  Strength not tested Skin:  Poor turgor   Psych:  Calm, cooperative.    Intake/Output from previous day: 08/29 0701 - 08/30 0700 In: 2715  [I.V.:2465; IV Piggyback:250] Out: 50 [Urine:50] Intake/Output this shift: No intake/output data recorded.  LAB RESULTS:  Recent Labs  05/03/2016 1107 05/08/2016 1933 05/16/16 0721  WBC 8.7 6.6 7.2  HGB 8.4* 7.3* 8.4*  HCT 26.8* 23.8* 26.9*  PLT 179 150 142*   BMET Lab Results  Component Value Date   NA 133 (L) 05/16/2016   NA 132 (L) 05/06/2016   NA 131 (L) 05/14/2016   K 3.6 05/16/2016   K 2.6 (LL) 04/21/2016   K 2.8 (L) 05/08/2016   CL 98 (L) 05/16/2016   CL 95 (L) 05/14/2016   CL 92 (L) 05/12/2016   CO2 28 05/16/2016   CO2 30 04/21/2016   CO2 30 04/27/2016   GLUCOSE 102 (H) 05/16/2016   GLUCOSE 170 (H) 04/27/2016   GLUCOSE 261 (H) 04/18/2016   BUN 24 (H) 05/16/2016   BUN 23 (H) 05/07/2016   BUN 25 (H) 05/13/2016   CREATININE 0.76 05/16/2016   CREATININE 0.70 05/09/2016   CREATININE 0.85 05/14/2016   CALCIUM 7.3 (L) 05/16/2016   CALCIUM 7.2 (L) 05/14/2016   CALCIUM 7.8 (L) 05/03/2016   LFT  Recent Labs  04/22/2016 1107 05/16/16 0721  PROT 4.8* 4.5*  ALBUMIN 1.8* 1.6*  AST 40 67*  ALT 26 42  ALKPHOS 73 74  BILITOT 0.6 0.5   PT/INR Lab Results  Component Value Date   INR 1.38 05/13/2016   INR 1.13 04/15/2016   INR 1.16 03/26/2016   Hepatitis Panel No results for input(s): HEPBSAG, HCVAB, HEPAIGM, HEPBIGM in the last 72 hours. C-Diff No components found for: CDIFF Lipase  No results found for: LIPASE  Drugs of Abuse  No results found for: LABOPIA, COCAINSCRNUR, LABBENZ, AMPHETMU, THCU, LABBARB   RADIOLOGY STUDIES: Dg Chest Port 1 View  Result Date: 04/17/2016 CLINICAL DATA:  Code sepsis EXAM: PORTABLE CHEST 1 VIEW COMPARISON:  04/25/2016 FINDINGS: Mild improvement in aeration in the lung bases. Persistent bibasilar atelectasis/ infiltrate and small pleural effusions. Negative for edema Left arm PICC tip pulled back slightly with the tip in the left innominate/SVC junction. IMPRESSION: Improved aeration in the lung bases. Persistent bibasilar  airspace disease and small bilateral effusions. Electronically Signed   By: Charles  Clark M.D.   On: 05/11/2016 11:12    ENDOSCOPIC STUDIES: Per HPI  IMPRESSION:   *  Severe, necrotic esophagitis in setting of GOO.  Esophageal biopsies showed fungal elements, received/completed IV Diflucan and on chronic Protonix.  *  Gastric mass/polyp.  Biopsied twice: hyperplastic.  Has EUS set for tomorrow 0730 at [facility].  *  Protein calorie malnutrition.  On TPN.    *  New sepsis.  Suspect recurrent and/or ongoing aspiration PNA   *  Thrombocytopenia, non-critical.  Has had issues with this in past.    *   Normocytic anemia.    PLAN:     *  EUS cancelled, d/w Dr Jacobs.  This is not urgent, the gastric mass in question has biopsied a few weeks ago hyperplastic polyp.   EGD set for tomorrow AM with Dr N.      Azucena Freed  05/16/2016, 10:16 AM Pager: [phone]

## 2016-05-16 NOTE — Telephone Encounter (Signed)
Sorry to hear this thanks for forwarding. Dr. Silverio Decamp has already seen and scheduled for EGD tomorrow, will await findings

## 2016-05-16 NOTE — Telephone Encounter (Signed)
Ok, if she recovers well enough then should reschedule the EUS.  I'll forward to in patient team and Dr. Havery Moros.

## 2016-05-16 NOTE — Telephone Encounter (Signed)
Pt notified that the message was received and I will also make Dr Ardis Hughs aware.  FYI:  Dr Ardis Hughs

## 2016-05-16 NOTE — Progress Notes (Signed)
PARENTERAL NUTRITION CONSULT NOTE - FOLLOW UP  Pharmacy Consult for TPN Indication: Gastric Outlet Obstruction  Allergies  Allergen Reactions  . Ace Inhibitors Cough  . Prednisone Other (See Comments)    LETHARGY  . Sitagliptin Other (See Comments)    Noted on [facility]    Patient Measurements: Height: 5\' 2"  (157.5 cm) Weight: 182 lb 5.1 oz (82.7 kg) IBW/kg (Calculated) : 50.1 Adjusted Body Weight: 75 kg Usual Weight: 80 kg  Vital Signs: Temp: 98.7 F (37.1 C) (08/30 0903) Temp Source: Axillary (08/30 0903) BP: 104/46 (08/30 0903) Pulse Rate: 80 (08/30 0427) Intake/Output from previous day: 08/29 0701 - 08/30 0700 In: 2715 [I.V.:2465; IV Piggyback:250] Out: 50 [Urine:50] Intake/Output from this shift: No intake/output data recorded.  Labs:  Recent Labs  04/27/2016 1107 04/18/2016 1933 05/16/16 0721  WBC 8.7 6.6 7.2  HGB 8.4* 7.3* 8.4*  HCT 26.8* 23.8* 26.9*  PLT 179 150 142*  INR 1.38  --   --      Recent Labs  05/14/2016 1107 04/22/2016 1933 05/16/16 0721  NA 131* 132* 133*  K 2.8* 2.6* 3.6  CL 92* 95* 98*  CO2 30 30 28   GLUCOSE 261* 170* 102*  BUN 25* 23* 24*  CREATININE 0.85 0.70 0.76  CALCIUM 7.8* 7.2* 7.3*  MG  --  2.0 1.8  PHOS  --  2.6 2.6  PROT 4.8*  --  4.5*  ALBUMIN 1.8*  --  1.6*  AST 40  --  67*  ALT 26  --  42  ALKPHOS 73  --  74  BILITOT 0.6  --  0.5  PREALBUMIN  --  4.4* 3.5*  TRIG  --   --  104   Estimated Creatinine Clearance: 57.7 mL/min (by C-G formula based on SCr of 0.8 mg/dL).    Recent Labs  05/02/2016 1857 04/26/2016 2209 05/16/16 0902  GLUCAP 161* 133* 102*    TPN at [facility]: (information obtained on 8/30 from [place] - pharmacist at [place]) Clinimix E 5/15 1440 ml + 20% lipids 192 ml Trace Elements, Multivitamin 35 units of regular insulin ordered for each bag - per discussion there is question if this was actually being added to each bag at the facility  Insulin Requirements in the past 24  hours:  No SSI required since admission  Current Nutrition:  TPN to restart tonight  Assessment: Recently admitted to [facility] with gastric outlet obstruction due to necrotic esophagitis and discharged on home Unasyn and home TPN to [facility].  Admitted to [facility] 8/29 with shortness of breath, fever, and confusion.  PICC line was removed on admission due to concern for line infection.  Per discussion with Dr. Thereasa Solo he suspects she had a transient aspiration event and is not continuing antibiotics at this time.  She is to continue nutrition support with TPN.  GI: gastric outlet obstruction, chronic TPN. Prealbumin 3.5, Triglycerides 104. Endo: hx DM, was requiring 35 units of regular insulin in TPN last admission and at [facility]. CBGs have declined on admission ([phone]) likely due to no provision of dextrose. Given her current CBG of 102 and questionable compliance with addition of insulin to her TPN bags at the facility (see note above) I am hesitant to restart back at a full 35 units of insulin tonight.  She has SSI ordered TID with meals and at bedtime - will adjust this to q4h and moderate scale (previously on resistant) Lytes: hypokalemia on admission, received K runs, K now  3.6. Mag 1.8, Phos 2.6. Renal: SCr 0.76, CrCl 58 ml/min. NS at 125 ml/hr.  Pulm: transient aspiration event. 4L [place]. Cards: BP soft, HR ok Hepatobil: AST elevated 67, ALT 42, TBili 0.5. Neuro: some confusion noted ID: antibiotics given on admission and discontinued 8/30.  Monitor off ABX. Best Practices: Protonix IV, SCDs TPN Access: central line placed 8/30 TPN start date: chronic TPN patient   Nutritional Goals:  1250-1400 kCal, 70-85 grams of protein per day  Plan:  Restart Clinimix E 5/15 at 60 ml/hr + 20% lipids at 8 ml/hr Will include 20 units of regular insulin in each bag (approximately 1/2 of what she was prescribed at the facility) Change CBGs and SSI to moderate scale with q4h checks Daily  multivitamin and trace elements in TPN Decrease NS to 60 ml/hr when TPN starts TPN labs on Thursday (CMET, Mag, Phos)  Legrand Como, Pharm.D., BCPS, AAHIVP Clinical Pharmacist Phone: [phone] or [phone] Pager: [phone] 05/16/2016, 12:30 PM

## 2016-05-16 NOTE — Progress Notes (Signed)
Initial Nutrition Assessment  DOCUMENTATION CODES:   Obesity unspecified  INTERVENTION:    TPN per pharmacy  NUTRITION DIAGNOSIS:   Inadequate oral intake related to inability to eat as evidenced by NPO status  GOAL:   Patient will meet greater than or equal to 90% of their needs  MONITOR:   Diet advancement, PO intake, Labs, Weight trends, I & O's  REASON FOR ASSESSMENT:   Consult New TPN/TNA  ASSESSMENT:    81 y.o. Female wiht PMH of degenerative disc disease, diabetes, diverticulosis, GERD, hiatal hernia, HLD, HTN, chronic hyponatremia recently admitted from 04/14/2016 until 04/27/2016 for treatment of acute GI bleed with gastric outlet obstruction, necrotic esophagitis with superficial fungal infection and ulceration with subsequent development of possible right-sided pneumonitis and aspiration pneumonia. Patient was discharged on Unasyn and completed her course on 05/14/2016.[name] Patient with left arm PICC in place since time of discharge for Unasyn and TPN administration. Patient's only complaint at this point time is shortness of breath and fevers. Denies worsening lower extremity edema, chest pain, palpitations, dysuria. Patient unable to provide further history.  RD unable to obtain nutrition hx >> pt receiving PICC line. Pt was recently at [facility] and discharged to [facility] on TPN. RD note reviewed 04/27/16 >> pt was receiving Clear Liquids. Unable to complete Nutrition Focused Physical Exam at this time.  Patient to receive TPN with Clinimix E 5/15 @ 60 ml/hr and lipids @ 5 ml/hr.  Provides 1440 ml, 1460 kcal, and 72 grams protein per day.  Meets 97% minimum estimated energy needs and 100% minimum estimated protein needs.  Diet Order:  Diet NPO time specified ADULT TPN TPN (CLINIMIX-E) Adult  Skin:  Reviewed, no issues  Last BM:  N/A  Height:   Ht Readings from Last 1 Encounters:  04/17/2016 5\' 2"  (1.575 m)    Weight:   Wt Readings  from Last 1 Encounters:  04/25/2016 182 lb 5.1 oz (82.7 kg)    Ideal Body Weight:  50 kg  BMI:  Body mass index is 33.35 kg/m.  Estimated Nutritional Needs:   Kcal:  1500-1700  Protein:  70-80 gm  Fluid:  1.5-1.7 L  EDUCATION NEEDS:   No education needs identified at this time  Arthur Holms, RD, LDN Pager #: [phone] After-Hours Pager #: [phone]

## 2016-05-16 NOTE — Progress Notes (Addendum)
[place] TEAM 1 - Stepdown/ICU TEAM  [name] [name]  [id number] DOB: [birth year] DOA: 05/10/2016 PCP: [name], MD    Brief Narrative:  81 y.o. female with history of degenerative disc disease, DM, diverticulosis, GERD, hiatal hernia, HLD, HTN, and chronic hyponatremia who was admitted 04/14/2016 > 04/27/2016 for treatment of acute GI bleed with gastric outlet obstruction due to necrotic esophagitis with superficial fungal infection and ulceration with subsequent development of possible right-sided pneumonitis and aspiration pneumonia. Patient was discharged on Unasyn and Diflucan and completed her course on 05/14/2016.  A Left arm PICC was placed for home Unasyn and TPN. She returned to the ED c/o shortness of breath and fevers. In the ED she was noted to be confused.    Subjective: The patient will awaken to voice and can tell me where she is but tells me the date is 66.  She cannot provide a detailed history.  She does not appear to be in acute respiratory distress nor does she appear to be in acute pain.  Her PICC line was removed at admission for fear of line infection and she has currently lost her peripheral IV with failed attempts to acquire new IV thus far.  Assessment & Plan:  SIRS v/s Sepsis / acute hypoxic respiratory failure / AMS febrile at nursing home to 104.5 - UA unremarkable - chest x-ray improved compared to recent -  sepsis physiology greatly improved/nearly resolved - wean oxygen as able - perhaps this represented a transient aspiration event - PCO2 not markedly elevated on admission and ABG - trend procalcitonin  Acute GI bleed / esophageal necrosis / gastric outlet obstruction with bowel ulceration GI previously suggested patient needed a follow-up EGD and possible EUS within 2-3 weeks, and pt was scheduled for same as oupt on 8/31 - consult called to  GI - was tx w/ a 28 day course of IV Unasyn and Diflucan - was on clear liquid diet at time of d/c     Chronic Hyponatremia Appears stable  - follow trend   Hypokalemia Corrected  Normocytic anemia Hemoglobin 8.4 - baseline at time of recent D/C ~9.0  - recent GI bleed - no evidence of large scale active bleeding   HTN Currently well-controlled/borderline hypotensive  DM2 CBG currently controlled - follow closely with lack of IV access at present and limited oral intake due to altered mental status  Limited IV access Will attempt to avoid replacing PICC until clear pt does not have a bacteremia - if a peripheral IV can not be obtained will consult PCCM for central line   DVT prophylaxis: SCDs Code Status: NO CODE - DNR Family Communication: no family present at time of exam  Disposition Plan: SDU  Consultants:  none  Procedures: none  Antimicrobials:  Cefepime 8/29 Diflucan 8/29 Vancomycin 8/29   Objective: Blood pressure 107/68, pulse 80, temperature 98.4 F (36.9 C), temperature source Oral, resp. rate (!) 22, height 5\' 2"  (1.575 m), weight 82.7 kg (182 lb 5.1 oz), SpO2 100 %.  Intake/Output Summary (Last 24 hours) at 05/16/16 0848 Last data filed at 05/16/16 0600  Gross per 24 hour  Intake             2715 ml  Output               50 ml  Net             2665 ml   Filed Weights   05/14/2016 1113 04/21/2016 1944  Weight:  77.2 kg (170 lb 3.2 oz) 82.7 kg (182 lb 5.1 oz)    Examination: General: No acute respiratory distress Lungs: Poor air movement bilateral bases but no wheezing or crackles other fields Cardiovascular: Regular rate and rhythm without murmur gallop or rub normal S1 and S2 Abdomen: Nontender, overweight, soft, bowel sounds positive, no rebound, no ascites, no appreciable mass Extremities: No significant cyanosis, or clubbing;  1+ edema bilateral lower extremities  CBC:  Recent Labs Lab 05/17/2016 1107 05/09/2016 1933 05/16/16 0721  WBC 8.7 6.6 7.2  NEUTROABS 7.8*  --  6.1  HGB 8.4* 7.3* 8.4*  HCT 26.8* 23.8* 26.9*  MCV 85.6 85.9 84.9   PLT 179 150 [id number]*   Basic Metabolic Panel:  Recent Labs Lab 04/19/2016 1107 04/23/2016 1933 05/16/16 0721  NA 131* 132* 133*  K 2.8* 2.6* 3.6  CL 92* 95* 98*  CO2 30 30 28   GLUCOSE 261* 170* 102*  BUN 25* 23* 24*  CREATININE 0.85 0.70 0.76  CALCIUM 7.8* 7.2* 7.3*  MG  --  2.0 1.8  PHOS  --  2.6 2.6   GFR: Estimated Creatinine Clearance: 57.7 mL/min (by C-G formula based on SCr of 0.8 mg/dL).  Liver Function Tests:  Recent Labs Lab 04/26/2016 1107 05/16/16 0721  AST 40 67*  ALT 26 42  ALKPHOS 73 74  BILITOT 0.6 0.5  PROT 4.8* 4.5*  ALBUMIN 1.8* 1.6*    Coagulation Profile:  Recent Labs Lab 04/28/2016 1107  INR 1.38    Cardiac Enzymes:  Recent Labs Lab 04/30/2016 1933  TROPONINI 0.06*    HbA1C: Hgb A1c MFr Bld  Date/Time Value Ref Range Status  04/15/2016 01:15 PM 6.6 (H) 4.8 - 5.6 % Final    Comment:    (NOTE)         Pre-diabetes: 5.7 - 6.4         Diabetes: >6.4         Glycemic control for adults with diabetes: <7.0     CBG:  Recent Labs Lab 05/17/2016 1857 04/24/2016 2209  GLUCAP 161* 133*    Recent Results (from the past 240 hour(s))  Blood Culture (routine x 2)     Status: None (Preliminary result)   Collection Time: 04/25/2016 11:00 AM  Result Value Ref Range Status   Specimen Description BLOOD LEFT HAND  Final   Special Requests BOTTLES DRAWN AEROBIC AND ANAEROBIC  5CC  Final   Culture PENDING  Incomplete   Report Status PENDING  Incomplete  MRSA PCR Screening     Status: None   Collection Time: 05/02/2016  6:54 PM  Result Value Ref Range Status   MRSA by PCR NEGATIVE NEGATIVE Final    Comment:        The GeneXpert MRSA Assay (FDA approved for NASAL specimens only), is one component of a comprehensive MRSA colonization surveillance program. It is not intended to diagnose MRSA infection nor to guide or monitor treatment for MRSA infections.      Scheduled Meds: . antiseptic oral rinse  7 mL Mouth Rinse q12n4p  . ceFEPime  (MAXIPIME) IV  2 g Intravenous Q12H  . chlorhexidine  15 mL Mouth Rinse BID  . heparin  5,000 Units Subcutaneous Q8H  . insulin aspart  0-5 Units Subcutaneous QHS  . insulin aspart  0-9 Units Subcutaneous TID WC  . pantoprazole  40 mg Intravenous Q12H  . sodium chloride flush  3 mL Intravenous Q12H  . vancomycin  750 mg Intravenous Q12H  Continuous Infusions: . sodium chloride 100 mL/hr at 05/16/16 0420     LOS: 1 day   Cherene Altes, MD [facility] Office  [phone] Pager - Text Page per Amion as per below:  On-Call/Text Page:      [email or web address]      password [email or web address]  If 7PM-7AM, please contact night-coverage [email or web address] Password [email or web address] 05/16/2016, 8:48 AM

## 2016-05-16 NOTE — Procedures (Signed)
Central Venous Catheter Insertion Procedure Note [name] [name] [id number] 07/06/1935  Procedure: Insertion of Central Venous Catheter Indications: Assessment of intravascular volume, Drug and/or fluid administration and Frequent blood sampling  Procedure Details Consent: Risks of procedure as well as the alternatives and risks of each were explained to the (patient/caregiver).  Consent for procedure obtained. Time Out: Verified patient identification, verified procedure, site/side was marked, verified correct patient position, special equipment/implants available, medications/allergies/relevent history reviewed, required imaging and test results available.  Performed  Maximum sterile technique was used including antiseptics, cap, gloves, gown, hand hygiene, mask and sheet. Skin prep: Chlorhexidine; local anesthetic administered A antimicrobial bonded/coated triple lumen catheter was placed in the left internal jugular vein using the Seldinger technique. Ultrasound guidance used.Yes.   Catheter placed to 20 cm. Blood aspirated via all 3 ports and then flushed x 3. Line sutured x 2 and dressing applied.  Evaluation Blood flow good Complications: No apparent complications Patient did tolerate procedure well. Chest X-ray ordered to verify placement.  CXR: pending.  [name] [name] Minor ACNP [name] [name] PCCM Pager [phone] till 3 pm If no answer page [phone] 05/16/2016, 11:48 AM  [name], M.D. [facility] Pulmonary/Critical Care Medicine. Pager: [phone]. After hours pager: [phone].

## 2016-05-16 NOTE — Progress Notes (Signed)
Verified that new IJ central line was in the SVC via radiology report prior to hanging first bag of TNA.

## 2016-05-17 ENCOUNTER — Ambulatory Visit (HOSPITAL_COMMUNITY): Admission: RE | Admit: 2016-05-17 | Payer: Medicare Other | Source: Ambulatory Visit | Admitting: Gastroenterology

## 2016-05-17 ENCOUNTER — Encounter (HOSPITAL_COMMUNITY): Admission: RE | Payer: Self-pay | Source: Ambulatory Visit

## 2016-05-17 ENCOUNTER — Inpatient Hospital Stay (HOSPITAL_COMMUNITY): Payer: Medicare Other | Admitting: Anesthesiology

## 2016-05-17 ENCOUNTER — Encounter (HOSPITAL_COMMUNITY): Admission: EM | Disposition: E | Payer: Self-pay | Source: Home / Self Care | Attending: Internal Medicine

## 2016-05-17 ENCOUNTER — Inpatient Hospital Stay (HOSPITAL_COMMUNITY): Payer: Medicare Other

## 2016-05-17 ENCOUNTER — Encounter (HOSPITAL_COMMUNITY): Payer: Self-pay | Admitting: *Deleted

## 2016-05-17 DIAGNOSIS — K319 Disease of stomach and duodenum, unspecified: Secondary | ICD-10-CM

## 2016-05-17 DIAGNOSIS — Z95828 Presence of other vascular implants and grafts: Secondary | ICD-10-CM

## 2016-05-17 DIAGNOSIS — B49 Unspecified mycosis: Secondary | ICD-10-CM | POA: Diagnosis present

## 2016-05-17 DIAGNOSIS — K208 Other esophagitis: Secondary | ICD-10-CM

## 2016-05-17 DIAGNOSIS — B379 Candidiasis, unspecified: Secondary | ICD-10-CM

## 2016-05-17 DIAGNOSIS — E1100 Type 2 diabetes mellitus with hyperosmolarity without nonketotic hyperglycemic-hyperosmolar coma (NKHHC): Secondary | ICD-10-CM

## 2016-05-17 DIAGNOSIS — K2210 Ulcer of esophagus without bleeding: Secondary | ICD-10-CM | POA: Diagnosis present

## 2016-05-17 DIAGNOSIS — D371 Neoplasm of uncertain behavior of stomach: Secondary | ICD-10-CM

## 2016-05-17 HISTORY — PX: ESOPHAGOGASTRODUODENOSCOPY (EGD) WITH PROPOFOL: SHX5813

## 2016-05-17 LAB — BLOOD CULTURE ID PANEL (REFLEXED)
Acinetobacter baumannii: NOT DETECTED
CANDIDA TROPICALIS: NOT DETECTED
Candida albicans: NOT DETECTED
Candida glabrata: DETECTED — AB
Candida krusei: NOT DETECTED
Candida parapsilosis: NOT DETECTED
ENTEROBACTERIACEAE SPECIES: NOT DETECTED
Enterobacter cloacae complex: NOT DETECTED
Enterococcus species: NOT DETECTED
Escherichia coli: NOT DETECTED
HAEMOPHILUS INFLUENZAE: NOT DETECTED
KLEBSIELLA PNEUMONIAE: NOT DETECTED
Klebsiella oxytoca: NOT DETECTED
Listeria monocytogenes: NOT DETECTED
NEISSERIA MENINGITIDIS: NOT DETECTED
PROTEUS SPECIES: NOT DETECTED
Pseudomonas aeruginosa: NOT DETECTED
STAPHYLOCOCCUS SPECIES: NOT DETECTED
STREPTOCOCCUS AGALACTIAE: NOT DETECTED
STREPTOCOCCUS SPECIES: NOT DETECTED
Serratia marcescens: NOT DETECTED
Staphylococcus aureus (BCID): NOT DETECTED
Streptococcus pneumoniae: NOT DETECTED
Streptococcus pyogenes: NOT DETECTED

## 2016-05-17 LAB — GLUCOSE, CAPILLARY
GLUCOSE-CAPILLARY: 117 mg/dL — AB (ref 65–99)
GLUCOSE-CAPILLARY: 131 mg/dL — AB (ref 65–99)
GLUCOSE-CAPILLARY: 141 mg/dL — AB (ref 65–99)
GLUCOSE-CAPILLARY: 169 mg/dL — AB (ref 65–99)
GLUCOSE-CAPILLARY: 219 mg/dL — AB (ref 65–99)
Glucose-Capillary: 220 mg/dL — ABNORMAL HIGH (ref 65–99)
Glucose-Capillary: 185 mg/dL — ABNORMAL HIGH (ref 65–99)

## 2016-05-17 LAB — COMPREHENSIVE METABOLIC PANEL
ALT: 30 U/L (ref 14–54)
ANION GAP: 5 (ref 5–15)
AST: 41 U/L (ref 15–41)
Albumin: 1.3 g/dL — ABNORMAL LOW (ref 3.5–5.0)
Alkaline Phosphatase: 66 U/L (ref 38–126)
BUN: 20 mg/dL (ref 6–20)
CHLORIDE: 100 mmol/L — AB (ref 101–111)
CO2: 28 mmol/L (ref 22–32)
Calcium: 6.9 mg/dL — ABNORMAL LOW (ref 8.9–10.3)
Creatinine, Ser: 0.74 mg/dL (ref 0.44–1.00)
GFR calc Af Amer: >60 mL/min (ref >60)
GFR calc non Af Amer: >60 mL/min (ref >60)
GLUCOSE: 152 mg/dL — AB (ref 65–99)
POTASSIUM: 3.2 mmol/L — AB (ref 3.5–5.1)
SODIUM: 133 mmol/L — AB (ref 135–145)
TOTAL PROTEIN: 3.9 g/dL — AB (ref 6.5–8.1)
Total Bilirubin: 0.2 mg/dL — ABNORMAL LOW (ref 0.3–1.2)

## 2016-05-17 LAB — CBC
HCT: 22.5 % — ABNORMAL LOW (ref 36.0–46.0)
HEMOGLOBIN: 7.1 g/dL — AB (ref 12.0–15.0)
MCH: 27.1 pg (ref 26.0–34.0)
MCHC: 31.6 g/dL (ref 30.0–36.0)
MCV: 85.9 fL (ref 78.0–100.0)
Platelets: 124 10*3/uL — ABNORMAL LOW (ref 150–400)
RBC: 2.62 MIL/uL — ABNORMAL LOW (ref 3.87–5.11)
RDW: 14.4 % (ref 11.5–15.5)
WBC: 4.3 10*3/uL (ref 4.0–10.5)

## 2016-05-17 LAB — MAGNESIUM: Magnesium: 1.8 mg/dL (ref 1.7–2.4)

## 2016-05-17 LAB — PHOSPHORUS: Phosphorus: 2.5 mg/dL (ref 2.5–4.6)

## 2016-05-17 LAB — LEGIONELLA PNEUMOPHILA SEROGP 1 UR AG: L. PNEUMOPHILA SEROGP 1 UR AG: NEGATIVE

## 2016-05-17 SURGERY — ESOPHAGEAL ENDOSCOPIC ULTRASOUND (EUS) RADIAL
Anesthesia: Monitor Anesthesia Care

## 2016-05-17 SURGERY — ESOPHAGOGASTRODUODENOSCOPY (EGD) WITH PROPOFOL
Anesthesia: Monitor Anesthesia Care

## 2016-05-17 SURGERY — EGD (ESOPHAGOGASTRODUODENOSCOPY)
Anesthesia: Monitor Anesthesia Care

## 2016-05-17 MED ORDER — ONDANSETRON HCL 4 MG/2ML IJ SOLN
INTRAMUSCULAR | Status: DC | PRN
  Administered 2016-05-17: 4 mg via INTRAVENOUS

## 2016-05-17 MED ORDER — PROPOFOL 10 MG/ML IV BOLUS
INTRAVENOUS | Status: DC | PRN
  Administered 2016-05-17: 30 mg via INTRAVENOUS

## 2016-05-17 MED ORDER — TRACE MINERALS CR-CU-MN-SE-ZN 10-1000-500-60 MCG/ML IV SOLN
INTRAVENOUS | Status: AC
  Administered 2016-05-17: 18:00:00 via INTRAVENOUS
  Filled 2016-05-17: qty 1560

## 2016-05-17 MED ORDER — FAT EMULSION 20 % IV EMUL
240.0000 mL | INTRAVENOUS | Status: AC
  Administered 2016-05-17: 240 mL via INTRAVENOUS
  Filled 2016-05-17: qty 250

## 2016-05-17 MED ORDER — PHENOL 1.4 % MT LIQD
1.0000 | OROMUCOSAL | Status: DC | PRN
  Administered 2016-05-17 – 2016-06-01 (×2): 1 via OROMUCOSAL
  Filled 2016-05-17: qty 177

## 2016-05-17 MED ORDER — SODIUM PHOSPHATES 45 MMOLE/15ML IV SOLN
10.0000 mmol | Freq: Once | INTRAVENOUS | Status: AC
  Administered 2016-05-17: 10 mmol via INTRAVENOUS
  Filled 2016-05-17: qty 3.33

## 2016-05-17 MED ORDER — SODIUM CHLORIDE 0.9 % IV SOLN
100.0000 mg | INTRAVENOUS | Status: AC
  Administered 2016-05-18 – 2016-05-30 (×13): 100 mg via INTRAVENOUS
  Filled 2016-05-17 (×16): qty 100

## 2016-05-17 MED ORDER — POTASSIUM CHLORIDE 10 MEQ/50ML IV SOLN
10.0000 meq | INTRAVENOUS | Status: AC
  Administered 2016-05-17 (×3): 10 meq via INTRAVENOUS
  Filled 2016-05-17 (×4): qty 50

## 2016-05-17 MED ORDER — BUTAMBEN-TETRACAINE-BENZOCAINE 2-2-14 % EX AERO
INHALATION_SPRAY | CUTANEOUS | Status: DC | PRN
  Administered 2016-05-17: 1 via TOPICAL

## 2016-05-17 MED ORDER — SODIUM CHLORIDE 0.9 % IV SOLN
INTRAVENOUS | Status: DC | PRN
  Administered 2016-05-17: 09:00:00 via INTRAVENOUS

## 2016-05-17 MED ORDER — PROPOFOL 500 MG/50ML IV EMUL
INTRAVENOUS | Status: DC | PRN
  Administered 2016-05-17: 50 ug/kg/min via INTRAVENOUS

## 2016-05-17 MED ORDER — SODIUM CHLORIDE 0.9 % IV SOLN
200.0000 mg | Freq: Once | INTRAVENOUS | Status: AC
  Administered 2016-05-17: 200 mg via INTRAVENOUS
  Filled 2016-05-17: qty 200

## 2016-05-17 MED ORDER — MAGNESIUM SULFATE 2 GM/50ML IV SOLN
2.0000 g | Freq: Once | INTRAVENOUS | Status: AC
  Administered 2016-05-17: 2 g via INTRAVENOUS
  Filled 2016-05-17: qty 50

## 2016-05-17 NOTE — Anesthesia Preprocedure Evaluation (Signed)
Anesthesia Evaluation  Patient identified by MRN, date of birth, ID band Patient awake    Reviewed: Allergy & Precautions, NPO status , Patient's Chart, lab work & pertinent test results  Airway Mallampati: II  TM Distance: >3 FB Neck ROM: Full    Dental  (+) Teeth Intact, Dental Advisory Given   Pulmonary    breath sounds clear to auscultation       Cardiovascular hypertension,  Rhythm:Regular Rate:Normal     Neuro/Psych    GI/Hepatic   Endo/Other  diabetes  Renal/GU      Musculoskeletal   Abdominal   Peds  Hematology   Anesthesia Other Findings   Reproductive/Obstetrics                             Anesthesia Physical Anesthesia Plan  ASA: III  Anesthesia Plan: MAC   Post-op Pain Management:    Induction: Intravenous  Airway Management Planned: Nasal Cannula and Natural Airway  Additional Equipment:   Intra-op Plan:   Post-operative Plan:   Informed Consent: I have reviewed the patients History and Physical, chart, labs and discussed the procedure including the risks, benefits and alternatives for the proposed anesthesia with the patient or authorized representative who has indicated his/her understanding and acceptance.     Plan Discussed with: CRNA and Anesthesiologist  Anesthesia Plan Comments:         Anesthesia Quick Evaluation

## 2016-05-17 NOTE — Anesthesia Procedure Notes (Signed)
Procedure Name: MAC Date/Time: 04/30/2016 9:47 AM Performed by: Jacquiline Doe A Pre-anesthesia Checklist: Patient identified, Emergency Drugs available, Suction available and Patient being monitored Patient Re-evaluated:Patient Re-evaluated prior to inductionOxygen Delivery Method: Nasal cannula Intubation Type: IV induction Airway Equipment and Method: Patient positioned with wedge pillow and Bite block Placement Confirmation: positive ETCO2 Dental Injury: Teeth and Oropharynx as per pre-operative assessment

## 2016-05-17 NOTE — Consult Note (Signed)
[facility] Surgery Consult Note  [name] [name] [facility] 08-10-1935  [id number].    Requesting MD: Dr. Silverio Decamp Chief Complaint/Reason for Consult: gastric outlet obstruction HPI:  81 year old female with PMH DM2, HTN, HLD, diverticulosis, hiatal hernia, severe reflux esophagitis, with a gastric prepyloric polypoid mass that was biopsied on 2 prior EGDs , noted to be hyperplastic, who presented to ED with confusion and respiratory distress likely secondary to aspiration with positive SIRS. Previously admitted 04/14/16-04/27/16 for treatment of acute GI bleed/gastric outlet obstruction/esophagitis. She has been NPO and on TPN and Unasyn at a SNF. Patient reported that she continued to vomit daily at [facility]. EGD was performed today by Dr. Silverio Decamp for evaluation of gastric outlet obstruction and biopsies of the esophaguas, gastric tumor at pylorus and pre-pyloric region of stomach were taken. General surgery has been asked to consult regarding possible palliative gastric bypass.  Biopsy of stomach 04/24/16 by Dr. Havery Moros: HYPERPLASTIC POLYP, NO EVIDENCE OF DYSPLASIA OR MALIGNANCY Past abdominal surgeries: cholecystectomy ~1972 Does not smoke or drink alcohol  ROS: All systems reviewed and otherwise negative except for as above  Family History  Problem Relation Age of Onset  . Hypertension Mother   . Stroke Mother   . Diabetes Sister     x 2  . Lung cancer Brother     mets  . Alcohol abuse Father   . Breast cancer Cousin     Mat. 1st cousin-Age 80    Past Medical History:  Diagnosis Date  . DDD (degenerative disc disease), lumbar   . Diabetes mellitus   . Diverticulosis   . Endocervical polyp   . Endometrial polyp   . GERD (gastroesophageal reflux disease)   . Hiatal hernia   . Hx of adenomatous colonic polyps   . Hyperlipidemia   . Hypertension   . Hyponatremia     Past Surgical History:  Procedure Laterality Date  . CHOLECYSTECTOMY    . DILATION AND CURETTAGE OF UTERUS     . ESOPHAGOGASTRODUODENOSCOPY (EGD) WITH PROPOFOL N/A 04/17/2016   Procedure: ESOPHAGOGASTRODUODENOSCOPY (EGD) WITH PROPOFOL;  Surgeon: Manus Gunning, MD;  Location: [facility] ENDOSCOPY;  Service: Gastroenterology;  Laterality: N/A;  . HYSTEROSCOPY    . TONSILLECTOMY      Social History:  reports that she has never smoked. She has never used smokeless tobacco. She reports that she does not drink alcohol or use drugs.  Allergies:  Allergies  Allergen Reactions  . Ace Inhibitors Cough  . Prednisone Other (See Comments)    LETHARGY  . Sitagliptin Other (See Comments)    Noted on MAR    Medications Prior to Admission  Medication Sig Dispense Refill  . acetaminophen (TYLENOL) 325 MG tablet Take 325 mg by mouth every 6 (six) hours as needed for moderate pain or headache.     . ADULT TPN Inject into the vein See admin instructions. With lipids to run at 70 ml's/hr continuous    . albuterol (PROVENTIL) (2.5 MG/3ML) 0.083% nebulizer solution Take 3 mLs (2.5 mg total) by nebulization every 6 (six) hours as needed for wheezing or shortness of breath. 75 mL 12  . alteplase (CATHFLO ACTIVASE) 2 MG injection 2 mg by Intracatheter route once as needed for open catheter.    . bisacodyl (DULCOLAX) 10 MG suppository Place 1 suppository (10 mg total) rectally daily as needed for moderate constipation. (Patient taking differently: Place 10 mg rectally daily as needed (for constipation). ) 12 suppository 0  . dextrose 10 % infusion Inject into the  vein See admin instructions. "68 ml's/ hr as needed for supplement dextrose 10% hang if there is any interruption of TPN"    . insulin aspart (NOVOLOG) 100 UNIT/ML injection Inject 0-20 Units into the skin every 4 (four) hours. (Patient taking differently: Inject 0-10 Units into the skin 4 (four) times daily -  before meals and at bedtime. Per sliding scale: BGL 0-150 = 0 units; 151-200 = 2 units; 201-250 = 4 units; 251-300 = 6 units; 301-350 = 8 units; 351-400 = 10  units; >401 = NOTIFY THE DOCTOR) 10 mL 11  . Insulin Glargine (LANTUS SOLOSTAR) 100 UNIT/ML Solostar Pen Inject 16 Units into the skin every morning.    . Multiple Vitamin (MULTIVITAMIN) LIQD Take 10 mLs by mouth daily.    . ondansetron (ZOFRAN) 4 MG tablet Take 4 mg by mouth See admin instructions. Every six to eight hours as needed for nausea or vomiting    . pantoprazole (PROTONIX) 40 MG tablet Take 40 mg by mouth 2 (two) times daily.    . promethazine 25 mg in sodium chloride 0.9 % 1,000 mL Inject 12.5 mg into the vein every 6 (six) hours as needed (for nausea).    [name] ampicillin-sulbactam 1.5 g in sodium chloride 0.9 % 50 mL Inject 1.5 g into the vein every 6 (six) hours.    [name] antiseptic oral rinse (CPC / CETYLPYRIDINIUM CHLORIDE 0.05%) 0.05 % LIQD solution 7 mLs by Mouth Rinse route 2 times daily at 12 noon and 4 pm.  0  . chlorhexidine (PERIDEX) 0.12 % solution 15 mLs by Mouth Rinse route 2 (two) times daily. 120 mL 0  . erythromycin ophthalmic ointment Place into both eyes at bedtime. 3.5 g 0  . fluconazole (DIFLUCAN) 200-0.9 MG/100ML-% IVPB Inject 100 mLs (200 mg total) into the vein daily. 100 mL   . ondansetron (ZOFRAN) 4 MG/2ML SOLN injection Inject 2 mLs (4 mg total) into the vein every 6 (six) hours as needed for nausea. 2 mL 0  . pantoprazole (PROTONIX) 40 MG injection Inject 40 mg into the vein every 12 (twelve) hours. 1 each   . Potassium Chloride in NaCl (0.9 % NACL WITH KCL 20 MEQ / L) 20-0.9 MEQ/L-% Inject 30 mL/hr into the vein continuous.    . sodium chloride flush (NS) 0.9 % SOLN 10-40 mLs by Intracatheter route as needed (flush).    . sodium chloride flush (NS) 0.9 % SOLN Inject 3 mLs into the vein every 12 (twelve) hours.      Blood pressure (!) 122/55, pulse 67, temperature 97.7 F (36.5 C), temperature source Oral, resp. rate 20, height 5\' 2"  (1.575 m), weight 82.7 kg (182 lb 5.1 oz), SpO2 100 %. Physical Exam: General: pleasant, WD/WN white female who is laying in bed  in NAD HEENT: head is normocephalic, atraumatic.  Mouth is pink and moist Heart: regular, rate, and rhythm.  No obvious murmurs, gallops, or rubs noted.  Lungs: CTAB, no wheezes or crackles noted.  Respiratory effort nonlabored Abd: overweight, soft, NT/ND, +BS, no masses, hernias, or organomegaly. Large transverse scar noted from previous cholecystectomy  MS: all 4 extremities are symmetrical with no cyanosis or clubbing. 1+ edema BLE Skin: warm and dry with no masses, lesions, or rashes Psych: alert and oriented to person and place with an appropriate affect.   Results for orders placed or performed during the hospital encounter of 04/21/2016 (from the past 48 hour(s))  I-Stat Arterial Blood Gas, ED - (order at [facility] and [facility]  only)     Status: Abnormal   Collection Time: 05/04/2016  5:57 PM  Result Value Ref Range   pH, Arterial 7.475 (H) 7.350 - 7.450   pCO2 arterial 43.4 32.0 - 48.0 mmHg   pO2, Arterial 86.0 83.0 - 108.0 mmHg   Bicarbonate 31.8 (H) 20.0 - 28.0 mmol/L   TCO2 33 0 - 100 mmol/L   O2 Saturation 97.0 %   Acid-Base Excess 8.0 (H) 0.0 - 2.0 mmol/L   Patient temperature 99.4 F    Collection site RADIAL, ALLEN'S TEST ACCEPTABLE    Drawn by RT    Sample type ARTERIAL   MRSA PCR Screening     Status: None   Collection Time: 05/08/2016  6:54 PM  Result Value Ref Range   MRSA by PCR NEGATIVE NEGATIVE    Comment:        The GeneXpert MRSA Assay (FDA approved for NASAL specimens only), is one component of a comprehensive MRSA colonization surveillance program. It is not intended to diagnose MRSA infection nor to guide or monitor treatment for MRSA infections.   Glucose, capillary     Status: Abnormal   Collection Time: 05/04/2016  6:57 PM  Result Value Ref Range   Glucose-Capillary 161 (H) 65 - 99 mg/dL  Basic metabolic panel     Status: Abnormal   Collection Time: 04/30/2016  7:33 PM  Result Value Ref Range   Sodium 132 (L) 135 - 145 mmol/L   Potassium 2.6 (LL) 3.5 - 5.1  mmol/L    Comment: CRITICAL RESULT CALLED TO, READ BACK BY AND VERIFIED WITHClyda Greener RN [phone] GREEN R    Chloride 95 (L) 101 - 111 mmol/L   CO2 30 22 - 32 mmol/L   Glucose, Bld 170 (H) 65 - 99 mg/dL   BUN 23 (H) 6 - 20 mg/dL   Creatinine, Ser 0.70 0.44 - 1.00 mg/dL   Calcium 7.2 (L) 8.9 - 10.3 mg/dL   GFR calc non Af Amer >60 >60 mL/min   GFR calc Af Amer >60 >60 mL/min    Comment: (NOTE) The eGFR has been calculated using the CKD EPI equation. This calculation has not been validated in all clinical situations. eGFR's persistently <60 mL/min signify possible Chronic Kidney Disease.    Anion gap 7 5 - 15  CBC     Status: Abnormal   Collection Time: 05/04/2016  7:33 PM  Result Value Ref Range   WBC 6.6 4.0 - 10.5 K/uL   RBC 2.77 (L) 3.87 - 5.11 MIL/uL   Hemoglobin 7.3 (L) 12.0 - 15.0 g/dL   HCT 23.8 (L) 36.0 - 46.0 %   MCV 85.9 78.0 - 100.0 fL   MCH 26.4 26.0 - 34.0 pg   MCHC 30.7 30.0 - 36.0 g/dL   RDW 14.5 11.5 - 15.5 %   Platelets 150 150 - 400 K/uL  Procalcitonin     Status: None   Collection Time: 05/17/2016  7:33 PM  Result Value Ref Range   Procalcitonin 0.57 ng/mL    Comment:        Interpretation: PCT > 0.5 ng/mL and <= 2 ng/mL: Systemic infection (sepsis) is possible, but other conditions are known to elevate PCT as well. (NOTE)         ICU PCT Algorithm               Non ICU PCT Algorithm    ----------------------------     ------------------------------  PCT < 0.25 ng/mL                 PCT < 0.1 ng/mL     Stopping of antibiotics            Stopping of antibiotics       strongly encouraged.               strongly encouraged.    ----------------------------     ------------------------------       PCT level decrease by               PCT < 0.25 ng/mL       >= 80% from peak PCT       OR PCT 0.25 - 0.5 ng/mL          Stopping of antibiotics                                             encouraged.     Stopping of antibiotics            encouraged.    ----------------------------     ------------------------------       PCT level decrease by              PCT >= 0.25 ng/mL       < 80% from peak PCT        AND PCT >= 0.5 ng/mL             Continuing antibiotics                                              encouraged.       Continuing antibiotics            encouraged.    ----------------------------     ------------------------------     PCT level increase compared          PCT > 0.5 ng/mL         with peak PCT AND          PCT >= 0.5 ng/mL             Escalation of antibiotics                                          strongly encouraged.      Escalation of antibiotics        strongly encouraged.   HIV antibody     Status: None   Collection Time: 05/01/2016  7:33 PM  Result Value Ref Range   HIV Screen 4th Generation wRfx Non Reactive Non Reactive    Comment: (NOTE) Performed At: [facility] [place], [place] [id number] Lindon Romp MD EY:[id number]   Magnesium     Status: None   Collection Time: 04/28/2016  7:33 PM  Result Value Ref Range   Magnesium 2.0 1.7 - 2.4 mg/dL  Phosphorus     Status: None   Collection Time: 04/17/2016  7:33 PM  Result Value Ref Range   Phosphorus 2.6 2.5 - 4.6 mg/dL  Troponin I     Status: Abnormal   Collection  Time: 04/28/2016  7:33 PM  Result Value Ref Range   Troponin I 0.06 (HH) <0.03 ng/mL    Comment: CRITICAL RESULT CALLED TO, READ BACK BY AND VERIFIED WITHManon Hilding RN [phone] GREEN R   Prealbumin     Status: Abnormal   Collection Time: 04/28/2016  7:33 PM  Result Value Ref Range   Prealbumin 4.4 (L) 18 - 38 mg/dL  Glucose, capillary     Status: Abnormal   Collection Time: 04/30/2016 10:09 PM  Result Value Ref Range   Glucose-Capillary 133 (H) 65 - 99 mg/dL  Comprehensive metabolic panel     Status: Abnormal   Collection Time: 05/16/16  7:21 AM  Result Value Ref Range   Sodium 133 (L) 135 - 145 mmol/L   Potassium 3.6 3.5 - 5.1 mmol/L    Chloride 98 (L) 101 - 111 mmol/L   CO2 28 22 - 32 mmol/L   Glucose, Bld 102 (H) 65 - 99 mg/dL   BUN 24 (H) 6 - 20 mg/dL   Creatinine, Ser 0.76 0.44 - 1.00 mg/dL   Calcium 7.3 (L) 8.9 - 10.3 mg/dL   Total Protein 4.5 (L) 6.5 - 8.1 g/dL   Albumin 1.6 (L) 3.5 - 5.0 g/dL   AST 67 (H) 15 - 41 U/L   ALT 42 14 - 54 U/L   Alkaline Phosphatase 74 38 - 126 U/L   Total Bilirubin 0.5 0.3 - 1.2 mg/dL   GFR calc non Af Amer >60 >60 mL/min   GFR calc Af Amer >60 >60 mL/min    Comment: (NOTE) The eGFR has been calculated using the CKD EPI equation. This calculation has not been validated in all clinical situations. eGFR's persistently <60 mL/min signify possible Chronic Kidney Disease.    Anion gap 7 5 - 15  Prealbumin     Status: Abnormal   Collection Time: 05/16/16  7:21 AM  Result Value Ref Range   Prealbumin 3.5 (L) 18 - 38 mg/dL  Magnesium     Status: None   Collection Time: 05/16/16  7:21 AM  Result Value Ref Range   Magnesium 1.8 1.7 - 2.4 mg/dL  Phosphorus     Status: None   Collection Time: 05/16/16  7:21 AM  Result Value Ref Range   Phosphorus 2.6 2.5 - 4.6 mg/dL  CBC     Status: Abnormal   Collection Time: 05/16/16  7:21 AM  Result Value Ref Range   WBC 7.2 4.0 - 10.5 K/uL   RBC 3.17 (L) 3.87 - 5.11 MIL/uL   Hemoglobin 8.4 (L) 12.0 - 15.0 g/dL   HCT 26.9 (L) 36.0 - 46.0 %   MCV 84.9 78.0 - 100.0 fL   MCH 26.5 26.0 - 34.0 pg   MCHC 31.2 30.0 - 36.0 g/dL   RDW 14.5 11.5 - 15.5 %   Platelets 142 (L) 150 - 400 K/uL  Differential     Status: None   Collection Time: 05/16/16  7:21 AM  Result Value Ref Range   Neutrophils Relative % 86 %   Neutro Abs 6.1 1.7 - 7.7 K/uL   Lymphocytes Relative 9 %   Lymphs Abs 0.7 0.7 - 4.0 K/uL   Monocytes Relative 4 %   Monocytes Absolute 0.3 0.1 - 1.0 K/uL   Eosinophils Relative 1 %   Eosinophils Absolute 0.1 0.0 - 0.7 K/uL   Basophils Relative 0 %   Basophils Absolute 0.0 0.0 - 0.1 K/uL  Lactic acid, plasma  Status: None    Collection Time: 05/16/16  7:21 AM  Result Value Ref Range   Lactic Acid, Venous 1.4 0.5 - 1.9 mmol/L  Triglycerides     Status: None   Collection Time: 05/16/16  7:21 AM  Result Value Ref Range   Triglycerides 104 <150 mg/dL  Glucose, capillary     Status: Abnormal   Collection Time: 05/16/16  9:02 AM  Result Value Ref Range   Glucose-Capillary 102 (H) 65 - 99 mg/dL  Glucose, capillary     Status: None   Collection Time: 05/16/16  1:06 PM  Result Value Ref Range   Glucose-Capillary 70 65 - 99 mg/dL  Glucose, capillary     Status: None   Collection Time: 05/16/16  6:00 PM  Result Value Ref Range   Glucose-Capillary 83 65 - 99 mg/dL  Glucose, capillary     Status: None   Collection Time: 05/16/16  9:11 PM  Result Value Ref Range   Glucose-Capillary 98 65 - 99 mg/dL   Comment 1 Notify RN   Glucose, capillary     Status: Abnormal   Collection Time: 05/12/2016 12:05 AM  Result Value Ref Range   Glucose-Capillary 117 (H) 65 - 99 mg/dL   Comment 1 Notify RN   Glucose, capillary     Status: Abnormal   Collection Time: 05/11/2016  4:35 AM  Result Value Ref Range   Glucose-Capillary 141 (H) 65 - 99 mg/dL  Comprehensive metabolic panel     Status: Abnormal   Collection Time: 05/14/2016  6:17 AM  Result Value Ref Range   Sodium 133 (L) 135 - 145 mmol/L   Potassium 3.2 (L) 3.5 - 5.1 mmol/L   Chloride 100 (L) 101 - 111 mmol/L   CO2 28 22 - 32 mmol/L   Glucose, Bld 152 (H) 65 - 99 mg/dL   BUN 20 6 - 20 mg/dL   Creatinine, Ser 0.74 0.44 - 1.00 mg/dL   Calcium 6.9 (L) 8.9 - 10.3 mg/dL   Total Protein 3.9 (L) 6.5 - 8.1 g/dL   Albumin 1.3 (L) 3.5 - 5.0 g/dL   AST 41 15 - 41 U/L   ALT 30 14 - 54 U/L   Alkaline Phosphatase 66 38 - 126 U/L   Total Bilirubin 0.2 (L) 0.3 - 1.2 mg/dL   GFR calc non Af Amer >60 >60 mL/min   GFR calc Af Amer >60 >60 mL/min    Comment: (NOTE) The eGFR has been calculated using the CKD EPI equation. This calculation has not been validated in all clinical  situations. eGFR's persistently <60 mL/min signify possible Chronic Kidney Disease.    Anion gap 5 5 - 15  Magnesium     Status: None   Collection Time: 05/14/2016  6:17 AM  Result Value Ref Range   Magnesium 1.8 1.7 - 2.4 mg/dL  Phosphorus     Status: None   Collection Time: 05/11/2016  6:17 AM  Result Value Ref Range   Phosphorus 2.5 2.5 - 4.6 mg/dL  CBC     Status: Abnormal   Collection Time: 04/17/2016  7:45 AM  Result Value Ref Range   WBC 4.3 4.0 - 10.5 K/uL   RBC 2.62 (L) 3.87 - 5.11 MIL/uL   Hemoglobin 7.1 (L) 12.0 - 15.0 g/dL   HCT 22.5 (L) 36.0 - 46.0 %   MCV 85.9 78.0 - 100.0 fL   MCH 27.1 26.0 - 34.0 pg   MCHC 31.6 30.0 - 36.0 g/dL  RDW 14.4 11.5 - 15.5 %   Platelets 124 (L) 150 - 400 K/uL  Glucose, capillary     Status: Abnormal   Collection Time: 04/27/2016  7:48 AM  Result Value Ref Range   Glucose-Capillary 131 (H) 65 - 99 mg/dL  Glucose, capillary     Status: Abnormal   Collection Time: 05/08/2016 11:38 AM  Result Value Ref Range   Glucose-Capillary 169 (H) 65 - 99 mg/dL   Dg Chest Port 1 View  Result Date: 04/28/2016 CLINICAL DATA:  Pneumonia. EXAM: PORTABLE CHEST 1 VIEW COMPARISON:  05/16/2016. FINDINGS: Interim placement of NG tube, its tip is below left hemidiaphragm. Left IJ line stable position. Cardiomegaly with diffuse bilateral from interstitial prominence and bilateral pleural effusions consistent congestive heart failure. Slight interim improvement from prior exam. Low lung volumes with bibasilar atelectasis. No pneumothorax. IMPRESSION: 1. Interim placement NG tube. Tip below left hemidiaphragm. Left IJ line stable position. 2. Cardiomegaly with diffuse bilateral from interstitial prominence and bilateral pleural effusions consistent congestive heart failure. Slight interim improvement. 3. Low lung volumes with mild bibasilar atelectasis . Electronically Signed   By: Marcello Moores  Register   On: 05/13/2016 06:36   Dg Chest Port 1 View  Result Date:  05/16/2016 CLINICAL DATA:  81 year old female status post central line placement. EXAM: PORTABLE CHEST 1 VIEW COMPARISON:  Chest x-ray 06/06/2016. FINDINGS: Previously noted left upper extremity PICC is been removed. New left internal jugular central venous catheter with tip terminating in the mid to distal superior vena cava. Lung volumes are low. No definite pneumothorax. There is cephalization of the pulmonary vasculature, indistinctness of the interstitial markings, and patchy airspace disease throughout the lungs bilaterally suggestive of moderate pulmonary edema. Small bilateral pleural effusions (left greater than right). Cardiomegaly. Upper mediastinal contours are within normal limits allowing for patient's rotation to the right. Aortic atherosclerosis. IMPRESSION: 1. Support apparatus, as above. 2. No pneumothorax or other complicating features following central line placement. 3. The appearance of the chest suggests developing congestive heart failure, as above. 4. Aortic atherosclerosis. Electronically Signed   By: Vinnie Langton M.D.   On: 05/16/2016 12:26      Assessment/Plan SIRS vs Sepsis / acute hypoxic respiratory failure / AMS Chronic Hyponatremia Hypokalemia - 3.2 today, receiving replacement Normocytic anemia -Hemoglobin 7.1 - baseline at time of recent D/C ~9.0 - recent GI bleed Malnourished - prealbumin 3.4 HTN DM2  Acute GI bleed / esophageal necrosis / gastric outlet obstruction with bowel ulceration -Biopsy of stomach 04/24/16 by Dr. Havery Moros: HYPERPLASTIC POLYP, NO EVIDENCE OF DYSPLASIA OR MALIGNANCY -Upper GI endoscopy earlier today showed severe reflux esophagitis, gastric tumor at the pylorus and in the prepyloric region of the stomach, gastric outlet obstruction, gastric tumor. Biopsies pending. -has been on TPN since 04/18/16 -will discuss possibility of palliative gastric bypass with MD    Jill Alexanders, [facility] Surgery 04/28/2016, 1:10  PM Pager: [phone] Consults: [phone] Mon-Fri 7:00 am-4:30 pm Sat-Sun 7:00 am-11:30 am

## 2016-05-17 NOTE — Consult Note (Signed)
[place] [facility]    Date of Admission:  05/11/2016          Reason for Consult: Automatic consultation for fungemia     Principal Problem:   Fungemia Active Problems:   Acute encephalopathy   Gastric outlet obstruction   Esophageal necrosis   Erosive esophagitis   DM2 (diabetes mellitus, type 2) (HCC)   Sepsis (HCC)   Acute respiratory failure (HCC)   On total parenteral nutrition (TPN)   Normocytic anemia   Essential hypertension   . antiseptic oral rinse  7 mL Mouth Rinse q12n4p  . chlorhexidine  15 mL Mouth Rinse BID  . insulin aspart  0-15 Units Subcutaneous Q4H  . magnesium sulfate 1 - 4 g bolus IVPB  2 g Intravenous Once  . pantoprazole  40 mg Intravenous Q12H  . sodium chloride flush  3 mL Intravenous Q12H  . sodium phosphate  Dextrose 5% IVPB  10 mmol Intravenous Once    Recommendations: 1. Repeat blood cultures 2. Start anidulafungin pending final culture results   Assessment: She has fungemia which could be related to her esophagitis or her PICC and parenteral nutrition. Given that she has had recent exposure to fluconazole and we do not know the speciation yet I will start her on anidulafungin. I have ordered repeat blood cultures.    HPI: [name] [name] is a 81 y.o. female who was hospitalized in July with a GI bleed. She was found to have a hyperplastic mass causing gastric outlet obstruction. She was also found to have erosive esophagitis. She was discharged home with a PICC receiving TPN. She was also on IV ampicillin sulbactam for possible aspiration pneumonia. She was also on fluconazole for possible esophageal candidiasis. As best I can tell from the records she stopped antimicrobial therapy on 05/14/2016. On 05/10/2016 she developed a fever to 105.2 and confusion and was readmitted. One of 2 admission blood cultures is growing yeast yet to be identified. Her PICC line has been removed. She received 1 dose of broad  empiric antibiotics on admission but then had them stopped. She has defervesced and her mental status is improving. She underwent EGD today with similar findings. She has severe erosive esophagitis.   Review of Systems: Review of Systems  Unable to perform ROS: Mental acuity    Past Medical History:  Diagnosis Date  . DDD (degenerative disc disease), lumbar   . Diabetes mellitus   . Diverticulosis   . Endocervical polyp   . Endometrial polyp   . GERD (gastroesophageal reflux disease)   . Hiatal hernia   . Hx of adenomatous colonic polyps   . Hyperlipidemia   . Hypertension   . Hyponatremia     Social History  Substance Use Topics  . Smoking status: Never Smoker  . Smokeless tobacco: Never Used  . Alcohol use No    Family History  Problem Relation Age of Onset  . Hypertension Mother   . Stroke Mother   . Diabetes Sister     x 2  . Lung cancer Brother     mets  . Alcohol abuse Father   . Breast cancer Cousin     Mat. 1st cousin-Age 62   Allergies  Allergen Reactions  . Ace Inhibitors Cough  . Prednisone Other (See Comments)    LETHARGY  . Sitagliptin Other (See Comments)    Noted on MAR    OBJECTIVE: Blood pressure [name])  128/55, pulse 74, temperature 97.8 F (36.6 C), temperature source Oral, resp. rate 17, height 5\' 2"  (1.575 m), weight 182 lb 5.1 oz (82.7 kg), SpO2 100 %.  Physical Exam  Constitutional:  She is alert but slightly confused. Her stepdaughters are at the bedside.  HENT:  New left IJ central line.  Cardiovascular: Normal rate and regular rhythm.   No murmur heard. Pulmonary/Chest: Effort normal and breath sounds normal. She has no wheezes. She has no rales.  Abdominal: Soft. There is no tenderness.    Lab Results Lab Results  Component Value Date   WBC 4.3 05/04/2016   HGB 7.1 (L) 05/07/2016   HCT 22.5 (L) 05/12/2016   MCV 85.9 05/05/2016   PLT 124 (L) 04/20/2016    Lab Results  Component Value Date   CREATININE 0.74 04/26/2016     BUN 20 05/06/2016   NA 133 (L) 04/29/2016   K 3.2 (L) 05/14/2016   CL 100 (L) 04/19/2016   CO2 28 04/24/2016    Lab Results  Component Value Date   ALT 30 05/16/2016   AST 41 04/29/2016   ALKPHOS 66 04/18/2016   BILITOT 0.2 (L) 04/25/2016     Microbiology: Recent Results (from the past 240 hour(s))  Blood Culture (routine x 2)     Status: None (Preliminary result)   Collection Time: 05/05/2016 11:00 AM  Result Value Ref Range Status   Specimen Description BLOOD LEFT HAND  Final   Special Requests BOTTLES DRAWN AEROBIC AND ANAEROBIC  5CC  Final   Culture NO GROWTH 2 DAYS  Final   Report Status PENDING  Incomplete  Blood Culture (routine x 2)     Status: Abnormal (Preliminary result)   Collection Time: 05/06/2016 11:13 AM  Result Value Ref Range Status   Specimen Description BLOOD RIGHT ANTECUBITAL  Final   Special Requests BOTTLES DRAWN AEROBIC AND ANAEROBIC  5CC  Final   Culture  Setup Time YEAST AEROBIC BOTTLE ONLY Organism ID to follow  (A)  Final   Culture YEAST (A)  Final   Report Status PENDING  Incomplete  Urine culture     Status: None   Collection Time: 05/09/2016  1:00 PM  Result Value Ref Range Status   Specimen Description URINE, CATHETERIZED  Final   Special Requests NONE  Final   Culture NO GROWTH  Final   Report Status 05/16/2016 FINAL  Final  MRSA PCR Screening     Status: None   Collection Time: 04/22/2016  6:54 PM  Result Value Ref Range Status   MRSA by PCR NEGATIVE NEGATIVE Final    Comment:        The GeneXpert MRSA Assay (FDA approved for NASAL specimens only), is one component of a comprehensive MRSA colonization surveillance program. It is not intended to diagnose MRSA infection nor to guide or monitor treatment for MRSA infections.     Michel Bickers, MD [facility] [phone] pager   [phone] cell 04/20/2016, 4:25 PM

## 2016-05-17 NOTE — Progress Notes (Signed)
PROGRESS NOTE    [name] [name]  [id number] DOB: [birth year] DOA: 04/22/2016 PCP: [name], MD   Brief Narrative:  81 y.o.WF PMHx degenerative disc disease, DM Type 2 controlled with complications, Diverticulosis, GERD, Hiatal Hernia, HLD, HTN, and Chronic Hyponatremia   who was admitted 04/14/2016 > 04/27/2016 for treatment of acute GI bleed with gastric outlet obstruction due to necrotic esophagitis with superficial fungal infection and ulceration with subsequent development of possible right-sided pneumonitis and aspiration pneumonia. Patient was discharged on Unasyn and Diflucan and completed her course on 05/14/2016.  A Left arm PICC was placed for home Unasyn and TPN. She returned to the ED c/o shortness of breath and fevers. In the ED she was noted to be confused.     Subjective: 8/31 A/O 2 (does not know when, why). Follows commands. NAD   Assessment & Plan:   Principal Problem:   Fungemia Active Problems:   DM2 (diabetes mellitus, type 2) ([place])   Gastric outlet obstruction   Esophageal necrosis   Sepsis ([place])   Acute respiratory failure ([place])   Acute encephalopathy   On total parenteral nutrition (TPN)   Normocytic anemia   Essential hypertension   Erosive esophagitis   Candida glabrata infection   Sepsis /Fungemia (positive Candida glabrata) -febrile at nursing home to 104.5  - UA unremarkable  - chest x-ray improved compared to recent  -ID has started patient on Anidulafungin -Have left message with ID: Stop TPN?  Acute hypoxic respiratory failure / AMS -Resolved  Acute GI bleed / esophageal necrosis / gastric outlet obstruction with bowel ulceration -EGD: Gastric tumor, esophagitis E results below -8/31 biopsy esophagitis pending  Gastric tumor -8/31 biopsy tumor pending -Awaiting CCS recommendations  Chronic Hyponatremia Appears stable  - follow trend   Hypokalemia -Potassium IV 40 mEq   Normocytic anemia -Hemoglobin 8.4 -  baseline at time of recent D/C ~9.0  - recent GI bleed - no evidence of large scale active bleeding   HTN -Currently well-controlled/borderline hypotensive -Echocardiogram pending: Preparation surgery  DM type 2 controlled without complication -[id number] Hemoglobin A1c = 6.6 -Outer SSI     DVT prophylaxis: SCD Code Status: DO NOT RESUSCITATE Family Communication: None Disposition Plan: Await CCS recommendation   Consultants:  Dr.Kavitha V Nandigam GI Dr. Ralene Ok [facility]    Procedures/Significant Events:  8/31 EGD:- Severe reflux esophagitis. Biopsied.- Gastric tumor at the pylorus and in the prepyloric region of the stomach.  Biopsied.- Gastric outlet obstruction                        Cultures 8/29 blood left hand NGTD 8/29 blood right AC positive Candida glabrata 8/29 urine negative 8/29 MRSA by PCR negative   Antimicrobials: Anidulafungin 8/31>>   Devices    LINES / TUBES:      Continuous Infusions: . Marland KitchenTPN (CLINIMIX-E) Adult 65 mL/hr at 05/09/2016 1734   And  . fat emulsion 240 mL (05/12/2016 1735)  . sodium chloride 60 mL/hr at 04/18/2016 0117     Objective: Vitals:   05/10/2016 1025 04/20/2016 1030 04/18/2016 1100 04/25/2016 1500  BP: 106/78 114/83 (!) 122/55 (!) 128/55  Pulse: 88 86 67 74  Resp: 19 20 20 17   Temp:   97.7 F (36.5 C) 97.8 F (36.6 C)  TempSrc:   Oral Oral  SpO2: 98% 98% 100% 100%  Weight:      Height:        Intake/Output Summary (Last 24 hours) at 05/15/2016  [place] filed at 05/16/2016 1200  Gross per 24 hour  Intake             2186 ml  Output              100 ml  Net             2086 ml   Filed Weights   04/17/2016 1113 04/18/2016 1944  Weight: 77.2 kg (170 lb 3.2 oz) 82.7 kg (182 lb 5.1 oz)    Examination:  General: A/O 2 (does not know when, why), No acute respiratory distress Eyes: negative scleral hemorrhage, negative anisocoria, negative icterus ENT: Negative Runny nose, negative gingival bleeding, Neck:  Negative  scars, masses, torticollis, lymphadenopathy, JVD Lungs: Clear to auscultation bilaterally without wheezes or crackles Cardiovascular: Regular rate and rhythm without murmur gallop or rub normal S1 and S2 Abdomen: negative abdominal pain, nondistended, positive soft, bowel sounds, no rebound, no ascites, no appreciable mass Extremities: No significant cyanosis, clubbing, or edema bilateral lower extremities Skin: Negative rashes, lesions, ulcers Psychiatric:  Negative depression, negative anxiety, negative fatigue, negative mania  Central nervous system:  Cranial nerves II through XII intact, tongue/uvula midline, all extremities muscle strength 5/5, sensation intact throughout,negative dysarthria, negative expressive aphasia, negative receptive aphasia.  .     Data Reviewed: Care during the described time interval was provided by me .  I have reviewed this patient's available data, including medical history, events of note, physical examination, and all test results as part of my evaluation. I have personally reviewed and interpreted all radiology studies.  CBC:  Recent Labs Lab 04/23/2016 1107 05/06/2016 1933 05/16/16 0721 04/25/2016 0745  WBC 8.7 6.6 7.2 4.3  NEUTROABS 7.8*  --  6.1  --   HGB 8.4* 7.3* 8.4* 7.1*  HCT 26.8* 23.8* 26.9* 22.5*  MCV 85.6 85.9 84.9 85.9  PLT 179 150 142* [id number]*   Basic Metabolic Panel:  Recent Labs Lab 04/30/2016 1107 04/28/2016 1933 05/16/16 0721 05/16/2016 0617  NA 131* 132* 133* 133*  K 2.8* 2.6* 3.6 3.2*  CL 92* 95* 98* 100*  CO2 30 30 28 28   GLUCOSE 261* 170* 102* 152*  BUN 25* 23* 24* 20  CREATININE 0.85 0.70 0.76 0.74  CALCIUM 7.8* 7.2* 7.3* 6.9*  MG  --  2.0 1.8 1.8  PHOS  --  2.6 2.6 2.5   GFR: Estimated Creatinine Clearance: 57.7 mL/min (by C-G formula based on SCr of 0.8 mg/dL). Liver Function Tests:  Recent Labs Lab 05/04/2016 1107 05/16/16 0721 05/16/2016 0617  AST 40 67* 41  ALT 26 42 30  ALKPHOS 73 74 66  BILITOT 0.6 0.5 0.2*    PROT 4.8* 4.5* 3.9*  ALBUMIN 1.8* 1.6* 1.3*   No results for input(s): LIPASE, AMYLASE in the last 168 hours. No results for input(s): AMMONIA in the last 168 hours. Coagulation Profile:  Recent Labs Lab 04/30/2016 1107  INR 1.38   Cardiac Enzymes:  Recent Labs Lab 04/30/2016 1933  TROPONINI 0.06*   BNP (last 3 results) No results for input(s): PROBNP in the last 8760 hours. HbA1C: No results for input(s): HGBA1C in the last 72 hours. CBG:  Recent Labs Lab 05/15/2016 0005 05/07/2016 0435 05/14/2016 0748 04/26/2016 1138 05/17/16 1559  GLUCAP 117* 141* 131* 169* 220*   Lipid Profile:  Recent Labs  05/16/16 0721  TRIG 104   Thyroid Function Tests: No results for input(s): TSH, T4TOTAL, FREET4, T3FREE, THYROIDAB in the last 72 hours. Anemia Panel: No results  for input(s): VITAMINB12, FOLATE, FERRITIN, TIBC, IRON, RETICCTPCT in the last 72 hours. Urine analysis:    Component Value Date/Time   COLORURINE AMBER (A) 05/07/2016 1300   APPEARANCEUR CLEAR 05/07/2016 1300   LABSPEC 1.029 04/18/2016 1300   PHURINE 6.5 05/02/2016 1300   GLUCOSEU 100 (A) 05/04/2016 1300   HGBUR SMALL (A) 05/04/2016 1300   BILIRUBINUR SMALL (A) 05/07/2016 1300   KETONESUR NEGATIVE 05/02/2016 1300   PROTEINUR 100 (A) 05/09/2016 1300   NITRITE NEGATIVE 05/05/2016 1300   LEUKOCYTESUR NEGATIVE 04/26/2016 1300   Sepsis Labs: @LABRCNTIP (procalcitonin:4,lacticidven:4)  ) Recent Results (from the past 240 hour(s))  Blood Culture (routine x 2)     Status: None (Preliminary result)   Collection Time: 04/27/2016 11:00 AM  Result Value Ref Range Status   Specimen Description BLOOD LEFT HAND  Final   Special Requests BOTTLES DRAWN AEROBIC AND ANAEROBIC  5CC  Final   Culture  Setup Time   Final    YEAST AEROBIC BOTTLE ONLY CRITICAL RESULT CALLED TO, READ BACK BY AND VERIFIED WITH: Colin Rhein, PHARM AT 1812 ON [id number] BY S. YARBROUGH    Culture TOO YOUNG TO READ  Final   Report Status PENDING  Incomplete   Blood Culture (routine x 2)     Status: Abnormal (Preliminary result)   Collection Time: 04/25/2016 11:13 AM  Result Value Ref Range Status   Specimen Description BLOOD RIGHT ANTECUBITAL  Final   Special Requests BOTTLES DRAWN AEROBIC AND ANAEROBIC  5CC  Final   Culture  Setup Time (A)  Final    YEAST AEROBIC BOTTLE ONLY Organism ID to follow CRITICAL RESULT CALLED TO, READ BACK BY AND VERIFIED WITH: Colin Rhein, PHARM AT 1706 ON [id number] BY Rhea Bleacher    Culture YEAST (A)  Final   Report Status PENDING  Incomplete  Blood Culture ID Panel (Reflexed)     Status: Abnormal   Collection Time: 04/30/2016 11:13 AM  Result Value Ref Range Status   Enterococcus species NOT DETECTED NOT DETECTED Final   Listeria monocytogenes NOT DETECTED NOT DETECTED Final   Staphylococcus species NOT DETECTED NOT DETECTED Final   Staphylococcus aureus NOT DETECTED NOT DETECTED Final   Streptococcus species NOT DETECTED NOT DETECTED Final   Streptococcus agalactiae NOT DETECTED NOT DETECTED Final   Streptococcus pneumoniae NOT DETECTED NOT DETECTED Final   Streptococcus pyogenes NOT DETECTED NOT DETECTED Final   Acinetobacter baumannii NOT DETECTED NOT DETECTED Final   Enterobacteriaceae species NOT DETECTED NOT DETECTED Final   Enterobacter cloacae complex NOT DETECTED NOT DETECTED Final   Escherichia coli NOT DETECTED NOT DETECTED Final   Klebsiella oxytoca NOT DETECTED NOT DETECTED Final   Klebsiella pneumoniae NOT DETECTED NOT DETECTED Final   Proteus species NOT DETECTED NOT DETECTED Final   Serratia marcescens NOT DETECTED NOT DETECTED Final   Haemophilus influenzae NOT DETECTED NOT DETECTED Final   Neisseria meningitidis NOT DETECTED NOT DETECTED Final   Pseudomonas aeruginosa NOT DETECTED NOT DETECTED Final   Candida albicans NOT DETECTED NOT DETECTED Final   Candida glabrata DETECTED (A) NOT DETECTED Final    Comment: CRITICAL RESULT CALLED TO, READ BACK BY AND VERIFIED WITH: Michele Mcalpine AT 1706 ON  [id number] BY S. YARBROUGH    Candida krusei NOT DETECTED NOT DETECTED Final   Candida parapsilosis NOT DETECTED NOT DETECTED Final   Candida tropicalis NOT DETECTED NOT DETECTED Final  Urine culture     Status: None   Collection Time: 04/30/2016  1:00 PM  Result Value  Ref Range Status   Specimen Description URINE, CATHETERIZED  Final   Special Requests NONE  Final   Culture NO GROWTH  Final   Report Status 05/16/2016 FINAL  Final  MRSA PCR Screening     Status: None   Collection Time: 04/20/2016  6:54 PM  Result Value Ref Range Status   MRSA by PCR NEGATIVE NEGATIVE Final    Comment:        The GeneXpert MRSA Assay (FDA approved for NASAL specimens only), is one component of a comprehensive MRSA colonization surveillance program. It is not intended to diagnose MRSA infection nor to guide or monitor treatment for MRSA infections.          Radiology Studies: Dg Chest Port 1 View  Result Date: 05/11/2016 CLINICAL DATA:  Pneumonia. EXAM: PORTABLE CHEST 1 VIEW COMPARISON:  05/16/2016. FINDINGS: Interim placement of NG tube, its tip is below left hemidiaphragm. Left IJ line stable position. Cardiomegaly with diffuse bilateral from interstitial prominence and bilateral pleural effusions consistent congestive heart failure. Slight interim improvement from prior exam. Low lung volumes with bibasilar atelectasis. No pneumothorax. IMPRESSION: 1. Interim placement NG tube. Tip below left hemidiaphragm. Left IJ line stable position. 2. Cardiomegaly with diffuse bilateral from interstitial prominence and bilateral pleural effusions consistent congestive heart failure. Slight interim improvement. 3. Low lung volumes with mild bibasilar atelectasis . Electronically Signed   By: Marcello Moores  Register   On: 04/30/2016 06:36   Dg Chest Port 1 View  Result Date: 05/16/2016 CLINICAL DATA:  81 year old female status post central line placement. EXAM: PORTABLE CHEST 1 VIEW COMPARISON:  Chest x-ray 06/06/2016.  FINDINGS: Previously noted left upper extremity PICC is been removed. New left internal jugular central venous catheter with tip terminating in the mid to distal superior vena cava. Lung volumes are low. No definite pneumothorax. There is cephalization of the pulmonary vasculature, indistinctness of the interstitial markings, and patchy airspace disease throughout the lungs bilaterally suggestive of moderate pulmonary edema. Small bilateral pleural effusions (left greater than right). Cardiomegaly. Upper mediastinal contours are within normal limits allowing for patient's rotation to the right. Aortic atherosclerosis. IMPRESSION: 1. Support apparatus, as above. 2. No pneumothorax or other complicating features following central line placement. 3. The appearance of the chest suggests developing congestive heart failure, as above. 4. Aortic atherosclerosis. Electronically Signed   By: Vinnie Langton M.D.   On: 05/16/2016 12:26        Scheduled Meds: . [START ON 05/18/2016] anidulafungin  100 mg Intravenous Q24H  . anidulafungin  200 mg Intravenous Once  . antiseptic oral rinse  7 mL Mouth Rinse q12n4p  . chlorhexidine  15 mL Mouth Rinse BID  . insulin aspart  0-15 Units Subcutaneous Q4H  . pantoprazole  40 mg Intravenous Q12H  . sodium chloride flush  3 mL Intravenous Q12H   Continuous Infusions: . Marland KitchenTPN (CLINIMIX-E) Adult 65 mL/hr at 05/11/2016 1734   And  . fat emulsion 240 mL (04/17/2016 1735)  . sodium chloride 60 mL/hr at 05/15/2016 0117     LOS: 2 days    Time spent: 40 minutes    Meilin Brosh, Geraldo Docker, MD [facility] Pager [phone]   If 7PM-7AM, please contact night-coverage [email or web address] Password [facility] 04/27/2016, 6:22 PM

## 2016-05-17 NOTE — Op Note (Signed)
[facility] Patient Name: [name] [name] Procedure Date : 04/25/2016 MRN: [id number] Attending MD: Mauri Pole , MD Date of Birth: Apr 14, 1935 CSN: [id number] Age: 81 Admit Type: Inpatient Procedure:                Upper GI endoscopy Indications:              Persistent vomiting of unknown cause Providers:                Mauri Pole, MD, Cleda Daub, RN, Elspeth Cho Tech., Technician, Jacquiline Doe, CRNA Referring MD:              Medicines:                Monitored Anesthesia Care Complications:            No immediate complications. Estimated Blood Loss:     Estimated blood loss was minimal. Procedure:                Pre-Anesthesia Assessment:                           - Prior to the procedure, a History and Physical                            was performed, and patient medications and                            allergies were reviewed. The patient's tolerance of                            previous anesthesia was also reviewed. The risks                            and benefits of the procedure and the sedation                            options and risks were discussed with the patient.                            All questions were answered, and informed consent                            was obtained. Prior Anticoagulants: The patient has                            taken no previous anticoagulant or antiplatelet                            agents. ASA Grade Assessment: IV - A patient with                            severe systemic disease that is a constant threat  to life. After reviewing the risks and benefits,                            the patient was deemed in satisfactory condition to                            undergo the procedure.                           After obtaining informed consent, the endoscope was                            passed under direct vision. Throughout the                  procedure, the patient's blood pressure, pulse, and                            oxygen saturations were monitored continuously. The                            [id number] scope was introduced through the                            mouth, and advanced to the prepyloric region,                            stomach. The upper GI endoscopy was accomplished                            without difficulty. The patient tolerated the                            procedure well. Scope In: Scope Out: Findings:      Severe esophagitis with diffuse ulceration and sloughing of mucosa from       EG junction at 37cm to proximal esophagus. Biopsies were taken with a       cold forceps for histology.      ~100-150 cc of clear fluid was suction from esophagus and stomach. A       large, polypoid, non-circumferential subepithelial mass with no bleeding       and no stigmata of recent bleeding was found in the prepyloric region of       the stomach extending throught the pylorus causing gastric outlet       obstruction with pin point pyloric channel. Tunnel biopsies were taken       with a cold forceps for histology.      An examination of the duodenum was not performed. Impression:               - Severe reflux esophagitis. Biopsied.                           - Rule out malignancy, gastric tumor at the pylorus                            and in the prepyloric  region of the stomach.                            Biopsied.                           Gastric outlet obstruction Moderate Sedation:      N/A Recommendation:           - NPO.                           - NG tube to intermittent gastric suction                           -IV PPI twiced daily                           - Continue TPN and present medications.                           - Await pathology results.                           - Surgery consult for possible palliative gastric                            bypass Procedure Code(s):         --- Professional ---                           [phone], 52, Esophagogastroduodenoscopy, flexible,                            transoral; with biopsy, single or multiple Diagnosis Code(s):        --- Professional ---                           K21.0, Gastro-esophageal reflux disease with                            esophagitis                           D49.0, Neoplasm of unspecified behavior of                            digestive system                           R11.10, Vomiting, unspecified CPT copyright 2016 [employer]. All rights reserved. The codes documented in this report are preliminary and upon coder review may  be revised to meet current compliance requirements. Mauri Pole, MD 05/02/2016 10:28:30 AM This report has been signed electronically. Number of Addenda: 0

## 2016-05-17 NOTE — Progress Notes (Signed)
Obtained phone consent for EGD from daughter - 2nd witness Remo Lipps RN

## 2016-05-17 NOTE — Progress Notes (Signed)
PARENTERAL NUTRITION CONSULT NOTE - FOLLOW UP  Pharmacy Consult:  TPN Indication:  Gastric Outlet Obstruction  Allergies  Allergen Reactions  . Ace Inhibitors Cough  . Prednisone Other (See Comments)    LETHARGY  . Sitagliptin Other (See Comments)    Noted on [facility]    Patient Measurements: Height: 5\' 2"  (157.5 cm) Weight: 182 lb 5.1 oz (82.7 kg) IBW/kg (Calculated) : 50.1 Adjusted Body Weight: 75 kg Usual Weight: 80 kg  Vital Signs: Temp: 97.7 F (36.5 C) (08/31 0445) Temp Source: Oral (08/31 0445) BP: 134/56 (08/31 0700) Pulse Rate: 73 (08/31 0700) Intake/Output from previous day: 08/30 0701 - 08/31 0700 In: 2106.2 [I.V.:2106.2] Out: -  Intake/Output from this shift: No intake/output data recorded.  Labs:  Recent Labs  04/26/2016 1107 05/13/2016 1933 05/16/16 0721  WBC 8.7 6.6 7.2  HGB 8.4* 7.3* 8.4*  HCT 26.8* 23.8* 26.9*  PLT 179 150 142*  INR 1.38  --   --      Recent Labs  05/11/2016 1107 05/10/2016 1933 05/16/16 0721 05/11/2016 0617  NA 131* 132* 133* 133*  K 2.8* 2.6* 3.6 3.2*  CL 92* 95* 98* 100*  CO2 30 30 28 28   GLUCOSE 261* 170* 102* 152*  BUN 25* 23* 24* 20  CREATININE 0.85 0.70 0.76 0.74  CALCIUM 7.8* 7.2* 7.3* 6.9*  MG  --  2.0 1.8 1.8  PHOS  --  2.6 2.6 2.5  PROT 4.8*  --  4.5* 3.9*  ALBUMIN 1.8*  --  1.6* 1.3*  AST 40  --  67* 41  ALT 26  --  42 30  ALKPHOS 73  --  74 66  BILITOT 0.6  --  0.5 0.2*  PREALBUMIN  --  4.4* 3.5*  --   TRIG  --   --  104  --    Estimated Creatinine Clearance: 57.7 mL/min (by C-G formula based on SCr of 0.8 mg/dL).    Recent Labs  05/16/16 2111 05/09/2016 0005 05/15/2016 0435  GLUCAP 98 117* 141*     Insulin Requirements in the past 24 hours:  0 units moderate SSI + 20 units regular insulin in TPN  Assessment: 58 YOF recently admitted to [facility] with gastric outlet obstruction due to necrotic esophagitis and discharged on home Unasyn and TPN to [facility].  Admitted to [facility] 04/18/2016 with shortness  of breath, fever, and confusion.  PICC line was removed on admission due to concern for line infection.  Per discussion with Dr. Thereasa Solo he suspects she had a transient aspiration event and is not continuing antibiotics at this time.  She is to continue nutrition support with TPN.  GI: GOO on chronic TPN. Prealbumin low at 3.5.  BM x 1.  EGD today. Endo: hx DM, was requiring 35 units of regular insulin in TPN during last admission and at [facility].  CBGs currently controlled with SSI + insulin in TPN Lytes: hyponatremia prior to TPN initiation - K+ low at 3.2, low Na/CL Renal: SCr 0.74 stable, CrCL 58 ml/min Pulm: transient aspiration event - reduced to 3L [place] Cards: VSS Hepatobil: LFTs normalized, TG WNL Neuro: some confusion noted ID: antibiotics given on admission and d/c'ed 8/30.  Monitor off abx.  Afebrile, WBC WNL Best Practices: Protonix IV, SCDs TPN Access: central line placed 05/16/16 TPN start date: chronic TPN patient  Current Nutrition:  TPN   TPN at [facility]: (information obtained on 8/30 from [facility] - pharmacist at [place]) Clinimix E 5/15  1440 ml + 20% lipids 192 ml 35 units of regular insulin ordered for each bag - per discussion there is question if this was actually being added to each bag at the facility  Nutritional Goals:  1500-1700 kCal, 70-80 grams of protein per day   Plan:  - Increase Clinimix E 5/15 to 65 ml/hr and 20% lipids to 10 ml/hr.  TPN will provide 1588 kCal and 78gm of protein per day, meeting 100% of patient's needs. - Daily multivitamin and trace elements in TPN - Continue moderate SSI Q4H + 20 units of regular insulin in TPN (required 35 units per day in the past) - Continue NS at 60 ml/hr per MD - KCL x 4 runs - Mag sulfate 2gm IV x 1 - NaPhos 10 mmol IV x 1 - F/U AM labs and EGD results   Eilam Shrewsbury D. Mina Marble, PharmD, BCPS Pager:  [phone] 04/17/2016, 7:53 AM

## 2016-05-17 NOTE — Transfer of Care (Signed)
Immediate Anesthesia Transfer of Care Note  Patient: [name] [name]  Procedure(s) Performed: Procedure(s): ESOPHAGOGASTRODUODENOSCOPY (EGD) WITH PROPOFOL (N/A)  Patient Location: Endoscopy Unit  Anesthesia Type:MAC  Level of Consciousness: awake, oriented, sedated, patient cooperative and responds to stimulation  Airway & Oxygen Therapy: Patient Spontanous Breathing and Patient connected to nasal cannula oxygen  Post-op Assessment: Report given to RN, Post -op Vital signs reviewed and stable, Patient moving all extremities and Patient moving all extremities X 4  Post vital signs: Reviewed and stable  Last Vitals:  Vitals:   05/12/2016 0902 05/16/2016 1020  BP: 130/68 106/78  Pulse: 78 88  Resp: (!) 21 19  Temp:      Last Pain:  Vitals:   05/07/2016 0700  TempSrc: Oral  PainSc:          Complications: No apparent anesthesia complications

## 2016-05-17 NOTE — NC FL2 (Signed)
[place] MEDICAID FL2 LEVEL OF CARE SCREENING TOOL     IDENTIFICATION  Patient Name: [name] [name] Birthdate: [birth year] Sex: female Admission Date (Current Location): 05/04/2016  [facility] and [place] Number:  Herbalist and Address:  [facility] [place]. [facility], [place] [address] [place], [place] [address]      Provider Number: [id number]  Attending Physician Name and Address:  Allie Bossier, MD  Relative Name and Phone Number:  [name], daughter, [phone]    Current Level of Care: Hospital Recommended Level of Care: [place] Prior Approval Number:    Date Approved/Denied:   PASRR Number: [id number]  Discharge Plan: SNF    Current Diagnoses: Patient Active Problem List   Diagnosis Date Noted  . Fungemia 05/11/2016  . Erosive esophagitis   . Sepsis ([place]) 05/08/2016  . Acute respiratory failure ([place]) 05/07/2016  . Acute encephalopathy 04/30/2016  . On total parenteral nutrition (TPN) 04/21/2016  . Normocytic anemia 04/26/2016  . Essential hypertension 04/23/2016  . 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 GERD with stricture 04/25/2016  . Upper GI bleed   . Gastric mass   . Aspiration into airway   . Gastric outlet obstruction   . Melena 04/15/2016  . Fall at home 04/15/2016  . Intramural leiomyoma of uterus 08/25/2015  . Urinary incontinence 06/02/2013  . Nocturia 06/02/2013  . Rectocele 06/02/2013  . Vaginal atrophy 06/02/2013  . Sebaceous cyst of labia 05/01/2013  . DM2 (diabetes mellitus, type 2) ([place])   . Reflux   . Sleep apnea     Orientation RESPIRATION BLADDER Height & Weight     Self, Place, Situation  Normal Continent Weight: 82.7 kg (182 lb 5.1 oz) Height:  5\' 2"  (157.5 cm)  BEHAVIORAL SYMPTOMS/MOOD NEUROLOGICAL BOWEL NUTRITION STATUS      Incontinent NG/panda (TPN)  AMBULATORY STATUS COMMUNICATION OF NEEDS Skin     Verbally Other (Comment) (Skin Tear)                       Personal Care  Assistance Level of Assistance  Bathing, Feeding, Dressing           Functional Limitations Info             SPECIAL CARE FACTORS FREQUENCY  PT (By licensed PT)                    Contractures Contractures Info: Not present    Additional Factors Info  Code Status, Allergies, Insulin Sliding Scale Code Status Info: DNR Allergies Info: Ace Inhibitors, Prednisone, Sitagliptin   Insulin Sliding Scale Info: insulin aspart (novoLOG) injection 0-15 Units;       Current Medications (05/14/2016):  This is the current hospital active medication list Current Facility-Administered Medications  Medication Dose Route Frequency Provider Last Rate Last Dose  . Marland KitchenTPN (CLINIMIX-E) Adult   Intravenous Continuous TPN Thuy D Dang, RPH       And  . fat emulsion 20 % infusion 240 mL  240 mL Intravenous Continuous TPN Thuy D Dang, RPH      . 0.9 %  sodium chloride infusion   Intravenous Continuous Norva Riffle, RPH 60 mL/hr at 05/10/2016 0117    . acetaminophen (TYLENOL) suppository 650 mg  650 mg Rectal Q4H PRN Cherene Altes, MD      . albuterol (PROVENTIL) (2.5 MG/3ML) 0.083% nebulizer solution 2.5 mg  2.5 mg Nebulization Q2H PRN Cherene Altes, MD      . [  START ON 05/18/2016] anidulafungin ([name]) 100 mg in sodium chloride 0.9 % 100 mL IVPB  100 mg Intravenous Q24H Michel Bickers, MD      . anidulafungin ([name]) 200 mg in sodium chloride 0.9 % 200 mL IVPB  200 mg Intravenous Once Michel Bickers, MD   200 mg at 05/14/2016 1703  . antiseptic oral rinse (CPC / CETYLPYRIDINIUM CHLORIDE 0.05%) solution 7 mL  7 mL Mouth Rinse q12n4p Waldemar Dickens, MD   7 mL at 05/05/2016 1555  . chlorhexidine (PERIDEX) 0.12 % solution 15 mL  15 mL Mouth Rinse BID Waldemar Dickens, MD   15 mL at 05/16/16 2200  . TPN (CLINIMIX-E) Adult   Intravenous Continuous TPN Norva Riffle, RPH 60 mL/hr at 05/16/16 1714     And  . fat emulsion 20 % infusion 192 mL  192 mL Intravenous Continuous TPN Norva Riffle,  RPH 8 mL/hr at 05/16/16 1715 192 mL at 05/16/16 1715  . hydrALAZINE (APRESOLINE) injection 5-10 mg  5-10 mg Intravenous Q4H PRN Waldemar Dickens, MD      . insulin aspart (novoLOG) injection 0-15 Units  0-15 Units Subcutaneous Q4H Norva Riffle, RPH   5 Units at 04/25/2016 1622  . ondansetron (ZOFRAN) injection 4 mg  4 mg Intravenous Q6H PRN Waldemar Dickens, MD      . pantoprazole (PROTONIX) injection 40 mg  40 mg Intravenous Q12H Waldemar Dickens, MD   40 mg at 05/16/16 2130  . phenol (CHLORASEPTIC) mouth spray 1 spray  1 spray Mouth/Throat PRN Cherene Altes, MD   1 spray at 05/06/2016 1300  . sodium chloride flush (NS) 0.9 % injection 3 mL  3 mL Intravenous Q12H Waldemar Dickens, MD   3 mL at 04/19/2016 0116  . sodium phosphate 10 mmol in sodium chloride 0.9 % 250 mL infusion  10 mmol Intravenous Once Thuy D Dang, RPH   10 mmol at 05/09/2016 1130     Discharge Medications: Please see discharge summary for a list of discharge medications.  Relevant Imaging Results:  Relevant Lab Results:   Additional Information ss# [id number]  Benard Halsted, LCSWA

## 2016-05-17 NOTE — Progress Notes (Signed)
PHARMACY - PHYSICIAN COMMUNICATION CRITICAL VALUE ALERT - BLOOD CULTURE IDENTIFICATION (BCID)  Results for orders placed or performed during the hospital encounter of 05/13/2016  Blood Culture ID Panel (Reflexed) (Collected: 05/16/2016 11:13 AM)  Result Value Ref Range   Enterococcus species NOT DETECTED NOT DETECTED   Listeria monocytogenes NOT DETECTED NOT DETECTED   Staphylococcus species NOT DETECTED NOT DETECTED   Staphylococcus aureus NOT DETECTED NOT DETECTED   Streptococcus species NOT DETECTED NOT DETECTED   Streptococcus agalactiae NOT DETECTED NOT DETECTED   Streptococcus pneumoniae NOT DETECTED NOT DETECTED   Streptococcus pyogenes NOT DETECTED NOT DETECTED   Acinetobacter baumannii NOT DETECTED NOT DETECTED   Enterobacteriaceae species NOT DETECTED NOT DETECTED   Enterobacter cloacae complex NOT DETECTED NOT DETECTED   Escherichia coli NOT DETECTED NOT DETECTED   Klebsiella oxytoca NOT DETECTED NOT DETECTED   Klebsiella pneumoniae NOT DETECTED NOT DETECTED   Proteus species NOT DETECTED NOT DETECTED   Serratia marcescens NOT DETECTED NOT DETECTED   Haemophilus influenzae NOT DETECTED NOT DETECTED   Neisseria meningitidis NOT DETECTED NOT DETECTED   Pseudomonas aeruginosa NOT DETECTED NOT DETECTED   Candida albicans NOT DETECTED NOT DETECTED   Candida glabrata DETECTED (A) NOT DETECTED   Candida krusei NOT DETECTED NOT DETECTED   Candida parapsilosis NOT DETECTED NOT DETECTED   Candida tropicalis NOT DETECTED NOT DETECTED    Name of physician (or Provider) Contacted: Text paged Dr. Sherral Hammers with updates.   Changes to prescribed antibiotics required: Dr. Megan Salon (ID) has seen patient and started her on anidulafungin, appropriate therapy for C. glabrata.  Maryanna Shape, PharmD, BCPS  Clinical Pharmacist  Pager: [phone]  04/21/2016  5:46 PM

## 2016-05-17 NOTE — Clinical Social Work Note (Signed)
Clinical Social Work Assessment  Patient Details  Name: [name] [name] MRN: [id number] Date of Birth: 10-13-1934  Date of referral:  05/09/2016               Reason for consult:  Facility Placement                Permission sought to share information with:  Facility Sport and exercise psychologist, Family Supports Permission granted to share information::  Yes, Verbal Permission Granted  Name::        Agency::  [facility]  Relationship::     Contact Information:     Housing/Transportation Living arrangements for the past 2 months:  Single Family Home Source of Information:  Patient Patient Interpreter Needed:  None Criminal Activity/Legal Involvement Pertinent to Current Situation/Hospitalization:  No - Comment as needed Significant Relationships:  Adult Children Lives with:  Facility Resident Do you feel safe going back to the place where you live?  Yes Need for family participation in patient care:  Yes (Comment)  Care giving concerns:  Pt has been staying at [facility] for medical care- no concerns with care there   Social Worker assessment / plan:  CSW spoke with pt and pt family at bedside concerning plan for time of DC.  Patient is agreeable to return to [place] when stable   Employment status:  Retired Nurse, adult PT Recommendations:  Not assessed at this time Information / Referral to community resources:     Patient/Family's Response to care:  Agreeable to return to SNF   Patient/Family's Understanding of and Emotional Response to Diagnosis, Current Treatment, and Prognosis:  Patient somewhat overwhelmed by current medical issues and is hopeful to know a direction for her care soon.  Emotional Assessment Appearance:  Appears stated age Attitude/Demeanor/Rapport:    Affect (typically observed):  Appropriate Orientation:  Oriented to Self, Oriented to Place, Oriented to Situation Alcohol / Substance use:  Not Applicable Psych  involvement (Current and /or in the community):  No (Comment)  Discharge Needs  Concerns to be addressed:  Care Coordination Readmission within the last 30 days:  Yes Current discharge risk:  Chronically ill Barriers to Discharge:  Continued Medical Work up   Jorge Ny, LCSW 05/16/2016, 5:03 PM

## 2016-05-18 ENCOUNTER — Inpatient Hospital Stay (HOSPITAL_COMMUNITY): Payer: Medicare Other

## 2016-05-18 DIAGNOSIS — Y718 Miscellaneous cardiovascular devices associated with adverse incidents, not elsewhere classified: Secondary | ICD-10-CM

## 2016-05-18 DIAGNOSIS — T80212A Local infection due to central venous catheter, initial encounter: Secondary | ICD-10-CM

## 2016-05-18 DIAGNOSIS — Z01810 Encounter for preprocedural cardiovascular examination: Secondary | ICD-10-CM

## 2016-05-18 LAB — GLUCOSE, CAPILLARY
GLUCOSE-CAPILLARY: 167 mg/dL — AB (ref 65–99)
GLUCOSE-CAPILLARY: 185 mg/dL — AB (ref 65–99)
GLUCOSE-CAPILLARY: 208 mg/dL — AB (ref 65–99)
Glucose-Capillary: 125 mg/dL — ABNORMAL HIGH (ref 65–99)
Glucose-Capillary: 156 mg/dL — ABNORMAL HIGH (ref 65–99)
Glucose-Capillary: 193 mg/dL — ABNORMAL HIGH (ref 65–99)

## 2016-05-18 LAB — BASIC METABOLIC PANEL
Anion gap: 7 (ref 5–15)
BUN: 20 mg/dL (ref 6–20)
CHLORIDE: 101 mmol/L (ref 101–111)
CO2: 25 mmol/L (ref 22–32)
Calcium: 7.2 mg/dL — ABNORMAL LOW (ref 8.9–10.3)
Creatinine, Ser: 0.69 mg/dL (ref 0.44–1.00)
GFR calc Af Amer: >60 mL/min (ref >60)
GFR calc non Af Amer: >60 mL/min (ref >60)
GLUCOSE: 154 mg/dL — AB (ref 65–99)
POTASSIUM: 3.5 mmol/L (ref 3.5–5.1)
Sodium: 133 mmol/L — ABNORMAL LOW (ref 135–145)

## 2016-05-18 LAB — ECHOCARDIOGRAM COMPLETE
HEIGHTINCHES: 62 in
Weight: 2917.13 oz

## 2016-05-18 LAB — MAGNESIUM: Magnesium: 2.1 mg/dL (ref 1.7–2.4)

## 2016-05-18 LAB — PHOSPHORUS: Phosphorus: 2.5 mg/dL (ref 2.5–4.6)

## 2016-05-18 MED ORDER — FAT EMULSION 20 % IV EMUL
240.0000 mL | INTRAVENOUS | Status: AC
  Administered 2016-05-18: 240 mL via INTRAVENOUS
  Filled 2016-05-18: qty 250

## 2016-05-18 MED ORDER — TRACE MINERALS CR-CU-MN-SE-ZN 10-1000-500-60 MCG/ML IV SOLN
INTRAVENOUS | Status: AC
  Administered 2016-05-18: 18:00:00 via INTRAVENOUS
  Filled 2016-05-18: qty 1560

## 2016-05-18 MED ORDER — POTASSIUM CHLORIDE 10 MEQ/50ML IV SOLN
10.0000 meq | INTRAVENOUS | Status: AC
  Administered 2016-05-18 (×3): 10 meq via INTRAVENOUS
  Filled 2016-05-18 (×3): qty 50

## 2016-05-18 NOTE — Progress Notes (Signed)
PARENTERAL NUTRITION CONSULT NOTE - FOLLOW UP  Pharmacy Consult:  TPN Indication:  Gastric Outlet Obstruction  Allergies  Allergen Reactions  . Ace Inhibitors Cough  . Prednisone Other (See Comments)    LETHARGY  . Sitagliptin Other (See Comments)    Noted on [facility]    Patient Measurements: Height: 5\' 2"  (157.5 cm) Weight: 182 lb 5.1 oz (82.7 kg) IBW/kg (Calculated) : 50.1 Adjusted Body Weight: 75 kg Usual Weight: 80 kg  Vital Signs: Temp: 97.9 F (36.6 C) (09/01 0322) Temp Source: Oral (09/01 0322) BP: 142/60 (09/01 0600) Pulse Rate: 87 (09/01 0600) Intake/Output from previous day: 08/31 0701 - 09/01 0700 In: 2546 [I.V.:2194; IV Piggyback:352] Out: 100 [Emesis/NG output:100] Intake/Output from this shift: No intake/output data recorded.  Insulin Requirements in the past 24 hours:  19 units moderate SSI + 20 units regular insulin in TPN  Assessment: 10 YOF recently admitted to [facility] with gastric outlet obstruction due to necrotic esophagitis and discharged on home Unasyn and TPN to [facility].  Admitted to [facility] 05/12/2016 with shortness of breath, fever, and confusion.  PICC line was removed on admission due to concern for line infection.  Per discussion with Dr. Thereasa Solo he suspects she had a transient aspiration event and is not continuing antibiotics at this time.  She is to continue nutrition support with TPN.  GI: GOO on chronic TPN. Albumin low at 1.3. Prealbumin low at 3.5. Last BM was 8/31.  EGD yesterday showed severe reflux esophagitis, a gastric tumor and gastric outlet obstruction. Got biopsy to rule out malignancy. GI recommends post-pyloric TFs per radiology soon. Endo: hx DM, was requiring 35 units of regular insulin in TPN during last admission and at [facility]. CBGs now elevated (150-220s) with SSI + 20 units of insulin in TPN Lytes: hyponatremia prior to TPN initiation. WNL, K up to 3.5 today after replacement, Mg up to 2.1. CoCa ok at 9.4 Renal: SCr stable, CrCL  ~34ml/min Pulm: transient aspiration event - reduced to 3L [place] Cards: VSS Hepatobil: LFTs normalized, TG WNL Neuro: some confusion noted ID: Blood cx detected candida glabrata. Started on anidulafungin for fungemia. Spoke with ID and since new PICC placed after positive blood cx the yare ok with continuing TPN for now. If repeat cx remains positive may have to consider line holiday. Afebrile, WBC wnl Best Practices: Protonix IV, SCDs TPN Access: central line placed 05/16/16 TPN start date: chronic TPN patient  Current Nutrition:  NPO TPN   TPN at [facility]: (information obtained on 8/30 from [facility] - pharmacist at [place]) Clinimix E 5/15 1440 ml + 20% lipids 192 ml 35 units of regular insulin ordered for each bag - per discussion there is question if this was actually being added to each bag at the facility  Nutritional Goals: per RD on 8/30 1500-1700 kCal 70-80 grams of protein per day  Plan:  Continue Clinimix E 5/15 at 62ml/hr Continue 20% lipid emulsion at 17ml/hr TPN provides 78 g of protein and 1560 kCals per day meeting 100% of patient needs Add MVI and TE in TPN Increase to 30 units of regular insulin in TPN Continue moderate SSI and adjust as needed Monitor TPN labs, repeat blood cx's (See ID section), Bmet tomorrow F/U trial of post-pyloric TFs?  Give 76mEq of KCl IV x 3 runs  Elenor Quinones, PharmD, [facility] Clinical Pharmacist Pager [phone] 05/18/2016 7:10 AM

## 2016-05-18 NOTE — Progress Notes (Signed)
  Echocardiogram 2D Echocardiogram has been performed.  [name] [name] 05/18/2016, 10:10 AM

## 2016-05-18 NOTE — Evaluation (Signed)
Physical Therapy Evaluation Patient Details Name: [name] [name] MRN: [id number] DOB: 1934/10/14 Today's Date: 05/18/2016   History of Present Illness  81 y.o.femalewith history of degenerative disc disease, DM, diverticulosis, GERD, hiatal hernia, HLD, HTN, and chronic hyponatremia who was admitted 04/14/2016 >04/27/2016 for treatment of acute GI bleed with gastric outlet obstruction due to necrotic esophagitis with superficial fungal infection and ulceration with subsequent development of possible right-sided pneumonitis and aspiration pneumonia. She returned to the ED c/o shortness of breath, fevers, and confusion. +sepsis (?due to PICC)  Clinical Impression  Pt admitted with above diagnosis. Pt currently with functional limitations due to the deficits listed below (see PT Problem List). Patient very motivated (and slightly impulsive). Anticipate good progress with therapy as medical issues resolve. Pt will benefit from skilled PT to increase their independence and safety with mobility to allow discharge to the venue listed below.       Follow Up Recommendations SNF    Equipment Recommendations  None recommended by PT    Recommendations for Other Services OT consult     Precautions / Restrictions Precautions Precautions: Fall Precaution Comments: NG tube; central IJ for TPN; peripheral IV; O2      Mobility  Bed Mobility Overal bed mobility: Needs Assistance Bed Mobility: Supine to Sit     Supine to sit: Mod assist;HOB elevated     General bed mobility comments: Min assist to scoot to EOB; pt reaching to pull on PT vs using her arms to assist herself; maximal cues  Transfers Overall transfer level: Needs assistance Equipment used: Rolling walker (2 wheeled);None Transfers: Sit to/from [employer] to Stand: Min assist Stand pivot transfers: Min guard       General transfer comment: Assist to rise, stabilize, control descent. VCs safety, hand  placement.  assisted from bed to [facility] to recliner  Ambulation/Gait             General Gait Details: unable due to incr HR  Stairs            Wheelchair Mobility    Modified Rankin (Stroke Patients Only)       Balance Overall balance assessment: Needs assistance Sitting-balance support: No upper extremity supported;Feet supported Sitting balance-Leahy Scale: Fair     Standing balance support: Bilateral upper extremity supported Standing balance-Leahy Scale: Poor Standing balance comment: stood in crouched position for pericare x 45 seconds, required seated rest and stood another 45 seconds                             Pertinent Vitals/Pain HR 108-138 (max after standing and transfer to chair) SaO2 96% on room air  Pain Assessment: No/denies pain    Home Living Family/patient expects to be discharged to:: Skilled nursing facility Living Arrangements: Alone               Additional Comments: has been at SNF since 8/11 discharge    Prior Function Level of Independence: Independent with assistive device(s)         Comments: prior to July admission     Hand Dominance        Extremity/Trunk Assessment   Upper Extremity Assessment: RUE deficits/detail;Generalized weakness RUE Deficits / Details: edematous         Lower Extremity Assessment: Generalized weakness (knee OA)      Cervical / Trunk Assessment: Other exceptions  Communication   Communication: No difficulties  Cognition Arousal/Alertness: Awake/alert Behavior  During Therapy: WFL for tasks assessed/performed Overall Cognitive Status: No family/caregiver present to determine baseline cognitive functioning                      General Comments General comments (skin integrity, edema, etc.): Impulsive and requires multiple cues to slow down, wait, leave her lines alone    Exercises        Assessment/Plan    PT Assessment Patient needs continued PT  services  PT Diagnosis Generalized weakness;Difficulty walking   PT Problem List Decreased strength;Decreased range of motion;Decreased activity tolerance;Decreased balance;Decreased cognition;Decreased mobility;Decreased knowledge of use of DME;Decreased safety awareness;Cardiopulmonary status limiting activity;Obesity  PT Treatment Interventions DME instruction;Gait training;Functional mobility training;Balance training;Therapeutic exercise;Therapeutic activities;Cognitive remediation;Patient/family education   PT Goals (Current goals can be found in the Care Plan section) Acute Rehab PT Goals Patient Stated Goal: Keep my strength up. I don't like lying around in bed PT Goal Formulation: With patient Time For Goal Achievement: 06/01/16 Potential to Achieve Goals: Good    Frequency Min 2X/week   Barriers to discharge        Co-evaluation               End of Session Equipment Utilized During Treatment: Gait belt;Oxygen Activity Tolerance: Patient limited by fatigue;Treatment limited secondary to medical complications (Comment) (HR up to 138) Patient left: in chair;with call bell/phone within reach;with nursing/sitter in room Nurse Communication: Mobility status         Time: [id number] PT Time Calculation (min) (ACUTE ONLY): 29 min   Charges:   PT Evaluation $PT Eval Moderate Complexity: 1 Procedure PT Treatments $Therapeutic Activity: 8-22 mins   PT G Codes:        [name] [name] 06/05/2016, 6:20 PM Pager [phone]

## 2016-05-18 NOTE — Progress Notes (Signed)
1 Day Post-Op  Subjective: Pt with NAE  Objective: Vital signs in last 24 hours: Temp:  [97.7 F (36.5 C)-98.4 F (36.9 C)] 98.2 F (36.8 C) (09/01 0743) Pulse Rate:  [67-95] 87 (09/01 0600) Resp:  [17-21] 19 (09/01 0743) BP: (106-142)/(47-83) 131/57 (09/01 0743) SpO2:  [93 %-100 %] 99 % (09/01 0600) Last BM Date: 04/25/2016  Intake/Output from previous day: 08/31 0701 - 09/01 0700 In: 2546 [I.V.:2194; IV Piggyback:352] Out: 100 [Emesis/NG output:100] Intake/Output this shift: No intake/output data recorded.  General appearance: alert and cooperative GI: soft, non-tender; bowel sounds normal; no masses,  no organomegaly  Lab Results:   Recent Labs  05/16/16 0721 05/11/2016 0745  WBC 7.2 4.3  HGB 8.4* 7.1*  HCT 26.9* 22.5*  PLT 142* 124*   BMET  Recent Labs  05/14/2016 0617 05/18/16 0316  NA 133* 133*  K 3.2* 3.5  CL 100* 101  CO2 28 25  GLUCOSE 152* 154*  BUN 20 20  CREATININE 0.74 0.69  CALCIUM 6.9* 7.2*   PT/INR  Recent Labs  05/08/2016 1107  LABPROT 17.1*  INR 1.38   ABG  Recent Labs  05/09/2016 1757  PHART 7.475*  HCO3 31.8*    Studies/Results: Dg Chest Port 1 View  Result Date: 05/02/2016 CLINICAL DATA:  Pneumonia. EXAM: PORTABLE CHEST 1 VIEW COMPARISON:  05/16/2016. FINDINGS: Interim placement of NG tube, its tip is below left hemidiaphragm. Left IJ line stable position. Cardiomegaly with diffuse bilateral from interstitial prominence and bilateral pleural effusions consistent congestive heart failure. Slight interim improvement from prior exam. Low lung volumes with bibasilar atelectasis. No pneumothorax. IMPRESSION: 1. Interim placement NG tube. Tip below left hemidiaphragm. Left IJ line stable position. 2. Cardiomegaly with diffuse bilateral from interstitial prominence and bilateral pleural effusions consistent congestive heart failure. Slight interim improvement. 3. Low lung volumes with mild bibasilar atelectasis . Electronically Signed   By:  Marcello Moores  Register   On: 04/30/2016 06:36   Dg Chest Port 1 View  Result Date: 05/16/2016 CLINICAL DATA:  81 year old female status post central line placement. EXAM: PORTABLE CHEST 1 VIEW COMPARISON:  Chest x-ray 06/06/2016. FINDINGS: Previously noted left upper extremity PICC is been removed. New left internal jugular central venous catheter with tip terminating in the mid to distal superior vena cava. Lung volumes are low. No definite pneumothorax. There is cephalization of the pulmonary vasculature, indistinctness of the interstitial markings, and patchy airspace disease throughout the lungs bilaterally suggestive of moderate pulmonary edema. Small bilateral pleural effusions (left greater than right). Cardiomegaly. Upper mediastinal contours are within normal limits allowing for patient's rotation to the right. Aortic atherosclerosis. IMPRESSION: 1. Support apparatus, as above. 2. No pneumothorax or other complicating features following central line placement. 3. The appearance of the chest suggests developing congestive heart failure, as above. 4. Aortic atherosclerosis. Electronically Signed   By: Vinnie Langton M.D.   On: 05/16/2016 12:26    Anti-infectives: Anti-infectives    Start     Dose/Rate Route Frequency Ordered Stop   05/18/16 1700  anidulafungin ([name]) 100 mg in sodium chloride 0.9 % 100 mL IVPB     100 mg over 90 Minutes Intravenous Every 24 hours 05/03/2016 1627     04/23/2016 1700  anidulafungin ([name]) 200 mg in sodium chloride 0.9 % 200 mL IVPB     200 mg over 180 Minutes Intravenous  Once 05/14/2016 1627 04/17/2016 2003   05/16/16 0200  vancomycin (VANCOCIN) IVPB 750 mg/150 ml premix  Status:  Discontinued  750 mg 150 mL/hr over 60 Minutes Intravenous Every 12 hours 04/22/2016 1409 05/16/16 1045   05/13/2016 1400  fluconazole (DIFLUCAN) IVPB 100 mg  Status:  Discontinued     100 mg 50 mL/hr over 60 Minutes Intravenous Every 24 hours 05/14/2016 1317 04/26/2016 1653   05/06/2016 1400   vancomycin (VANCOCIN) IVPB 1000 mg/200 mL premix  Status:  Discontinued     1,000 mg 200 mL/hr over 60 Minutes Intravenous  Once 05/03/2016 1353 05/17/2016 1407   05/02/2016 1330  vancomycin (VANCOCIN) IVPB 1000 mg/200 mL premix     1,000 mg 200 mL/hr over 60 Minutes Intravenous  Once 04/22/2016 1325 04/22/2016 1525   04/18/2016 1300  ceFEPIme (MAXIPIME) 2 g in dextrose 5 % 50 mL IVPB  Status:  Discontinued     2 g 100 mL/hr over 30 Minutes Intravenous Every 12 hours 04/26/2016 1257 05/16/16 1043      Assessment/Plan: SIRS vs Sepsis / acute hypoxic respiratory failure / AMS Chronic Hyponatremia Hypokalemia  Normocytic anemia -Hemoglobin 7.1 - baseline at time of recent D/C ~9.0 - recent GI bleed Malnourished - prealbumin 3.4 HTN DM2  Acute GI bleed / 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  Once Nutrition optimized may be able to tol pyloroplasty vs. Gastro-jej.  Will follow   LOS: 3 days    Rosario Jacks., [facility] 05/18/2016

## 2016-05-18 NOTE — Progress Notes (Signed)
[name] [name]  [name] [name]  [id number] DOB: 1935/05/21 DOA: 05/13/2016 PCP: [name], MD    Brief Narrative:  81 y.o. female with history of degenerative disc disease, DM, diverticulosis, GERD, hiatal hernia, HLD, HTN, and chronic hyponatremia who was admitted 04/14/2016 > 04/27/2016 for treatment of acute GI bleed with gastric outlet obstruction due to necrotic esophagitis with superficial fungal infection and ulceration with subsequent development of possible right-sided pneumonitis and aspiration pneumonia. Patient was discharged on Unasyn and Diflucan and completed her course on 05/14/2016.  A Left arm PICC was placed for home Unasyn and TPN. She returned to the ED c/o shortness of breath and fevers. In the ED she was noted to be confused.    Subjective: The pt is resting comfortably.  There is no evidence of respiratory distress or uncontrolled pain.    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 gastro-jej - suggestion is for IR to place post-pyloric feeding tube for enteral feeds, though I suspect this will be quite difficult - will request, but keep current NG in place for suction until it can be replaced  w/ a post-pyloric tube   Chronic Hyponatremia stable  - follow trend   Hypokalemia Corrected - follow   Normocytic anemia Hemoglobin 8.4 - baseline at time of recent D/C ~9.0  - recent GI bleed - no evidence of large scale active bleeding   HTN Follow w/o change in tx today   DM2 CBG reasonably controlled   DVT prophylaxis: SCDs Code Status: NO CODE - DNR Family Communication: no family present at time of exam  Disposition Plan: SDU  Consultants:  GI Gen Surgery  ID  Procedures: 8/30 CVL insertion per PCCM 8/31 EGD - Severe esophagitis with diffuse ulceration and sloughing of mucosa from EG junction at 37cm to proximal esophagus / large, polypoid, non-circumferential subepithelial mass in the prepyloric region causing GOO  9/1 TTE  Antimicrobials:  Cefepime 8/29 Diflucan 8/29 Vancomycin 8/29  anidulafungin 8/31 >  Objective: Blood pressure (!) 131/57, pulse 87, temperature 98.2 F (36.8 C), temperature source Oral, resp. rate 19, height 5\' 2"  (1.575 m), weight 82.7 kg (182 lb 5.1 oz), SpO2 99 %.  Intake/Output Summary (Last 24 hours) at 05/18/16 1410 Last data filed at 05/18/16 0600  Gross per 24 hour  Intake             1504 ml  Output                0 ml  Net             1504 ml   Filed Weights   04/24/2016 1113 05/01/2016 1944  Weight: 77.2 kg (170 lb 3.2 oz) 82.7 kg (182 lb 5.1 oz)  Examination: General: No acute respiratory distress - alert - mildly confused  Lungs: Poor air movement bilateral bases - no wheezing or crackles other fields Cardiovascular: Regular rate and rhythm without murmur  Abdomen: Nontender, overweight, soft, bowel sounds positive, no rebound, no ascites, no appreciable mass Extremities: No significant cyanosis, or clubbing;  1+ edema bilateral lower extremities stable   CBC:  Recent Labs Lab 05/14/2016 1107 05/06/2016 1933 05/16/16 0721 05/16/2016 0745  WBC 8.7 6.6 7.2 4.3  NEUTROABS 7.8*  --  6.1  --   HGB 8.4* 7.3* 8.4* 7.1*   HCT 26.8* 23.8* 26.9* 22.5*  MCV 85.6 85.9 84.9 85.9  PLT 179 150 142* [id number]*   Basic Metabolic Panel:  Recent Labs Lab 05/05/2016 1107 04/19/2016 1933 05/16/16 0721 04/22/2016 0617 05/18/16 0316  NA 131* 132* 133* 133* 133*  K 2.8* 2.6* 3.6 3.2* 3.5  CL 92* 95* 98* 100* 101  CO2 30 30 28 28 25   GLUCOSE 261* 170* 102* 152* 154*  BUN 25* 23* 24* 20 20  CREATININE 0.85 0.70 0.76 0.74 0.69  CALCIUM 7.8* 7.2* 7.3* 6.9* 7.2*  MG  --  2.0 1.8 1.8 2.1  PHOS  --  2.6 2.6 2.5 2.5   GFR: Estimated Creatinine Clearance: 57.7 mL/min (by C-G formula based on SCr of 0.8 mg/dL).  Liver Function Tests:  Recent Labs Lab 05/01/2016 1107 05/16/16 0721 05/13/2016 0617  AST 40 67* 41  ALT 26 42 30  ALKPHOS 73 74 66  BILITOT 0.6 0.5 0.2*  PROT 4.8* 4.5* 3.9*  ALBUMIN 1.8* 1.6* 1.3*    Coagulation Profile:  Recent Labs Lab 05/12/2016 1107  INR 1.38    Cardiac Enzymes:  Recent Labs Lab 05/09/2016 1933  TROPONINI 0.06*    HbA1C: Hgb A1c MFr Bld  Date/Time Value Ref Range Status  04/15/2016 01:15 PM 6.6 (H) 4.8 - 5.6 % Final    Comment:    (NOTE)         Pre-diabetes: 5.7 - 6.4         Diabetes: >6.4         Glycemic control for adults with diabetes: <7.0     CBG:  Recent Labs Lab 04/22/2016 1923 04/22/2016 2317 05/18/16 0327 05/18/16 0740 05/18/16 1313  GLUCAP 185* 219* 156* 167* 193*    Recent Results (from the past 240 hour(s))  Blood Culture (routine x 2)     Status: None (Preliminary result)   Collection Time: 05/04/2016 11:00 AM  Result Value Ref Range Status   Specimen Description BLOOD LEFT HAND  Final   Special Requests BOTTLES DRAWN AEROBIC AND ANAEROBIC  5CC  Final   Culture  Setup Time   Final    YEAST AEROBIC BOTTLE ONLY CRITICAL RESULT CALLED TO, READ BACK BY AND VERIFIED WITH: Colin Rhein, PHARM AT 1812 ON [id number] BY Rhea Bleacher    Culture YEAST CULTURE REINCUBATED FOR BETTER GROWTH   Final   Report Status PENDING  Incomplete  Blood Culture (routine x 2)      Status: Abnormal (Preliminary result)   Collection Time: 05/08/2016 11:13 AM  Result Value Ref Range Status   Specimen Description BLOOD RIGHT ANTECUBITAL  Final   Special Requests BOTTLES DRAWN AEROBIC AND ANAEROBIC  5CC  Final   Culture  Setup Time (A)  Final    YEAST AEROBIC BOTTLE ONLY CRITICAL RESULT CALLED TO, READ BACK BY AND VERIFIED WITH: Colin Rhein, PHARM AT [place] ON [id number] BY Rhea Bleacher    Culture CANDIDA  GLABRATA (A)  Final   Report Status PENDING  Incomplete  Blood Culture ID Panel (Reflexed)     Status: Abnormal   Collection Time: 04/27/2016 11:13 AM  Result Value Ref Range Status   Enterococcus species NOT DETECTED NOT DETECTED Final   Listeria monocytogenes NOT DETECTED NOT DETECTED Final   Staphylococcus species NOT DETECTED NOT DETECTED Final   Staphylococcus aureus NOT DETECTED NOT DETECTED Final   Streptococcus species NOT DETECTED NOT DETECTED Final   Streptococcus agalactiae NOT DETECTED NOT DETECTED Final   Streptococcus pneumoniae NOT DETECTED NOT DETECTED Final   Streptococcus pyogenes NOT DETECTED NOT DETECTED Final   Acinetobacter baumannii NOT DETECTED NOT DETECTED Final   Enterobacteriaceae species NOT DETECTED NOT DETECTED Final   Enterobacter cloacae complex NOT DETECTED NOT DETECTED Final   Escherichia coli NOT DETECTED NOT DETECTED Final   Klebsiella oxytoca NOT DETECTED NOT DETECTED Final   Klebsiella pneumoniae NOT DETECTED NOT DETECTED Final   Proteus species NOT DETECTED NOT DETECTED Final   Serratia marcescens NOT DETECTED NOT DETECTED Final   Haemophilus influenzae NOT DETECTED NOT DETECTED Final   Neisseria meningitidis NOT DETECTED NOT DETECTED Final   Pseudomonas aeruginosa NOT DETECTED NOT DETECTED Final   Candida albicans NOT DETECTED NOT DETECTED Final   Candida glabrata DETECTED (A) NOT DETECTED Final    Comment: CRITICAL RESULT CALLED TO, READ BACK BY AND VERIFIED WITH: Michele Mcalpine AT 1706 ON [id number] BY S. YARBROUGH    Candida krusei  NOT DETECTED NOT DETECTED Final   Candida parapsilosis NOT DETECTED NOT DETECTED Final   Candida tropicalis NOT DETECTED NOT DETECTED Final  Urine culture     Status: None   Collection Time: 05/03/2016  1:00 PM  Result Value Ref Range Status   Specimen Description URINE, CATHETERIZED  Final   Special Requests NONE  Final   Culture NO GROWTH  Final   Report Status 05/16/2016 FINAL  Final  MRSA PCR Screening     Status: None   Collection Time: 04/21/2016  6:54 PM  Result Value Ref Range Status   MRSA by PCR NEGATIVE NEGATIVE Final    Comment:        The GeneXpert MRSA Assay (FDA approved for NASAL specimens only), is one component of a comprehensive MRSA colonization surveillance program. It is not intended to diagnose MRSA infection nor to guide or monitor treatment for MRSA infections.   Culture, blood (routine x 2)     Status: None (Preliminary result)   Collection Time: 04/20/2016  6:19 PM  Result Value Ref Range Status   Specimen Description BLOOD LEFT HAND  Final   Special Requests IN PEDIATRIC BOTTLE 1.5CC  Final   Culture NO GROWTH < 24 HOURS  Final   Report Status PENDING  Incomplete  Culture, blood (routine x 2)     Status: None (Preliminary result)   Collection Time: 05/16/2016  6:36 PM  Result Value Ref Range Status   Specimen Description BLOOD RIGHT HAND  Final   Special Requests IN PEDIATRIC BOTTLE 0.5CC  Final   Culture NO GROWTH < 24 HOURS  Final   Report Status PENDING  Incomplete     Scheduled Meds: . anidulafungin  100 mg Intravenous Q24H  . antiseptic oral rinse  7 mL Mouth Rinse q12n4p  . chlorhexidine  15 mL Mouth Rinse BID  . insulin aspart  0-15 Units Subcutaneous Q4H  . pantoprazole  40 mg Intravenous Q12H  . potassium chloride  10 mEq Intravenous Q1 Hr  x 3  . sodium chloride flush  3 mL Intravenous Q12H   Continuous Infusions: . Marland KitchenTPN (CLINIMIX-E) Adult 65 mL/hr at 05/09/2016 1734   And  . fat emulsion 240 mL (04/18/2016 1735)  . Marland KitchenTPN (CLINIMIX-E)  Adult     And  . fat emulsion       LOS: 3 days   Cherene Altes, MD [facility] Office  [phone] Pager - Text Page per Amion as per below:  On-Call/Text Page:      [email or web address]      password [email or web address]  If 7PM-7AM, please contact night-coverage [email or web address] Password [email or web address] 05/18/2016, 2:10 PM

## 2016-05-18 NOTE — Care Management Important Message (Signed)
Important Message  Patient Details  Name: [name] [name] MRN: [id number] Date of Birth: 1935/05/13   Medicare Important Message Given:  Yes    Angie Piercey Abena 05/18/2016, 9:31 AM

## 2016-05-18 NOTE — Progress Notes (Signed)
[facility]   Reason for visit: Follow up on C glabrata picc line infection  Interval History: last fever 8/29, WBC wnl, new picc line placed, blood cultures repeated.     Physical Exam: Constitutional:  Vitals:   05/18/16 0600 05/18/16 0743  BP: (!) 142/60 (!) 131/57  Pulse: 87   Resp: 19 19  Temp:  98.2 F (36.8 C)   patient appears in NAD Respiratory: Normal respiratory effort; CTA B Cardiovascular: RRR GI: soft  Review of Systems: Constitutional: negative for fevers and chills Respiratory: negative for cough  Lab Results  Component Value Date   WBC 4.3 04/20/2016   HGB 7.1 (L) 04/17/2016   HCT 22.5 (L) 04/20/2016   MCV 85.9 04/22/2016   PLT 124 (L) 05/11/2016    Lab Results  Component Value Date   CREATININE 0.69 05/18/2016   BUN 20 05/18/2016   NA 133 (L) 05/18/2016   K 3.5 05/18/2016   CL 101 05/18/2016   CO2 25 05/18/2016    Lab Results  Component Value Date   ALT 30 05/09/2016   AST 41 04/29/2016   ALKPHOS 66 05/01/2016     Microbiology: Recent Results (from the past 240 hour(s))  Blood Culture (routine x 2)     Status: None (Preliminary result)   Collection Time: 05/17/2016 11:00 AM  Result Value Ref Range Status   Specimen Description BLOOD LEFT HAND  Final   Special Requests BOTTLES DRAWN AEROBIC AND ANAEROBIC  5CC  Final   Culture  Setup Time   Final    YEAST AEROBIC BOTTLE ONLY CRITICAL RESULT CALLED TO, READ BACK BY AND VERIFIED WITH: Colin Rhein, PHARM AT 1812 ON [id number] BY Rhea Bleacher    Culture YEAST CULTURE REINCUBATED FOR BETTER GROWTH   Final   Report Status PENDING  Incomplete  Blood Culture (routine x 2)     Status: Abnormal (Preliminary result)   Collection Time: 05/09/2016 11:13 AM  Result Value Ref Range Status   Specimen Description BLOOD RIGHT ANTECUBITAL  Final   Special Requests BOTTLES DRAWN AEROBIC AND ANAEROBIC  5CC  Final   Culture  Setup Time (A)  Final    YEAST AEROBIC BOTTLE  ONLY CRITICAL RESULT CALLED TO, READ BACK BY AND VERIFIED WITH: Colin Rhein, PHARM AT 1706 ON [id number] BY S. YARBROUGH    Culture CANDIDA GLABRATA (A)  Final   Report Status PENDING  Incomplete  Blood Culture ID Panel (Reflexed)     Status: Abnormal   Collection Time: 04/17/2016 11:13 AM  Result Value Ref Range Status   Enterococcus species NOT DETECTED NOT DETECTED Final   Listeria monocytogenes NOT DETECTED NOT DETECTED Final   Staphylococcus species NOT DETECTED NOT DETECTED Final   Staphylococcus aureus NOT DETECTED NOT DETECTED Final   Streptococcus species NOT DETECTED NOT DETECTED Final   Streptococcus agalactiae NOT DETECTED NOT DETECTED Final   Streptococcus pneumoniae NOT DETECTED NOT DETECTED Final   Streptococcus pyogenes NOT DETECTED NOT DETECTED Final   Acinetobacter baumannii NOT DETECTED NOT DETECTED Final   Enterobacteriaceae species NOT DETECTED NOT DETECTED Final   Enterobacter cloacae complex NOT DETECTED NOT DETECTED Final   Escherichia coli NOT DETECTED NOT DETECTED Final   Klebsiella oxytoca NOT DETECTED NOT DETECTED Final   Klebsiella pneumoniae NOT DETECTED NOT DETECTED Final   Proteus species NOT DETECTED NOT DETECTED Final   Serratia marcescens NOT DETECTED NOT DETECTED Final   Haemophilus influenzae NOT DETECTED NOT DETECTED Final  Neisseria meningitidis NOT DETECTED NOT DETECTED Final   Pseudomonas aeruginosa NOT DETECTED NOT DETECTED Final   Candida albicans NOT DETECTED NOT DETECTED Final   Candida glabrata DETECTED (A) NOT DETECTED Final    Comment: CRITICAL RESULT CALLED TO, READ BACK BY AND VERIFIED WITH: Michele Mcalpine AT 1706 ON [id number] BY S. YARBROUGH    Candida krusei NOT DETECTED NOT DETECTED Final   Candida parapsilosis NOT DETECTED NOT DETECTED Final   Candida tropicalis NOT DETECTED NOT DETECTED Final  Urine culture     Status: None   Collection Time: 04/25/2016  1:00 PM  Result Value Ref Range Status   Specimen Description URINE, CATHETERIZED   Final   Special Requests NONE  Final   Culture NO GROWTH  Final   Report Status 05/16/2016 FINAL  Final  MRSA PCR Screening     Status: None   Collection Time: 05/01/2016  6:54 PM  Result Value Ref Range Status   MRSA by PCR NEGATIVE NEGATIVE Final    Comment:        The GeneXpert MRSA Assay (FDA approved for NASAL specimens only), is one component of a comprehensive MRSA colonization surveillance program. It is not intended to diagnose MRSA infection nor to guide or monitor treatment for MRSA infections.     Impression/Plan:  1. C glabrata line infection - on anidulafungin pending sensitivities.  Was previously on diflucan.  TTE negative for vegetation.   2. Nutrition - on TPN, ok to continue.

## 2016-05-18 NOTE — Progress Notes (Signed)
Daily Rounding Note  05/18/2016, 9:27 AM  LOS: 3 days   SUBJECTIVE:   Chief complaint: confusion Pt sleeping a lot.  NGT in place.  No complaints but difficult to arouse.   OBJECTIVE:         Vital signs in last 24 hours:    Temp:  [97.7 F (36.5 C)-98.4 F (36.9 C)] 98.2 F (36.8 C) (09/01 0743) Pulse Rate:  [67-95] 87 (09/01 0600) Resp:  [17-21] 19 (09/01 0743) BP: (106-142)/(47-83) 131/57 (09/01 0743) SpO2:  [93 %-100 %] 99 % (09/01 0600) Last BM Date: 04/18/2016 Filed Weights   05/05/2016 1113 05/03/2016 1944  Weight: 77.2 kg (170 lb 3.2 oz) 82.7 kg (182 lb 5.1 oz)   General: 2 d echo in progress.  Pt sleeping, snoring   Heart: RRR Chest: rough BS Abdomen: soft, quiet, NT, obese  Extremities: + LE edema Neuro/Psych:  Sleeping, did not arouse with exam or to her name.  No attempt made to wake her up.    Intake/Output from previous day: 08/31 0701 - 09/01 0700 In: 2546 [I.V.:2194; IV Piggyback:352] Out: 100 [Emesis/NG output:100]  Intake/Output this shift: No intake/output data recorded.  Lab Results:  Recent Labs  04/25/2016 1933 05/16/16 0721 05/12/2016 0745  WBC 6.6 7.2 4.3  HGB 7.3* 8.4* 7.1*  HCT 23.8* 26.9* 22.5*  PLT 150 142* 124*   BMET  Recent Labs  05/16/16 0721 05/01/2016 0617 05/18/16 0316  NA 133* 133* 133*  K 3.6 3.2* 3.5  CL 98* 100* 101  CO2 28 28 25   GLUCOSE 102* 152* 154*  BUN 24* 20 20  CREATININE 0.76 0.74 0.69  CALCIUM 7.3* 6.9* 7.2*   LFT  Recent Labs  05/04/2016 1107 05/16/16 0721 04/17/2016 0617  PROT 4.8* 4.5* 3.9*  ALBUMIN 1.8* 1.6* 1.3*  AST 40 67* 41  ALT 26 42 30  ALKPHOS 73 74 66  BILITOT 0.6 0.5 0.2*   PT/INR  Recent Labs  04/26/2016 1107  LABPROT 17.1*  INR 1.38   Hepatitis Panel No results for input(s): HEPBSAG, HCVAB, HEPAIGM, HEPBIGM in the last 72 hours.  Studies/Results: Dg Chest Port 1 View  Result Date: 04/25/2016 CLINICAL DATA:  Pneumonia.  EXAM: PORTABLE CHEST 1 VIEW COMPARISON:  05/16/2016. FINDINGS: Interim placement of NG tube, its tip is below left hemidiaphragm. Left IJ line stable position. Cardiomegaly with diffuse bilateral from interstitial prominence and bilateral pleural effusions consistent congestive heart failure. Slight interim improvement from prior exam. Low lung volumes with bibasilar atelectasis. No pneumothorax. IMPRESSION: 1. Interim placement NG tube. Tip below left hemidiaphragm. Left IJ line stable position. 2. Cardiomegaly with diffuse bilateral from interstitial prominence and bilateral pleural effusions consistent congestive heart failure. Slight interim improvement. 3. Low lung volumes with mild bibasilar atelectasis . Electronically Signed   By: Marcello Moores  Register   On: 05/05/2016 06:36   Dg Chest Port 1 View  Result Date: 05/16/2016 CLINICAL DATA:  81 year old female status post central line placement. EXAM: PORTABLE CHEST 1 VIEW COMPARISON:  Chest x-ray 06/06/2016. FINDINGS: Previously noted left upper extremity PICC is been removed. New left internal jugular central venous catheter with tip terminating in the mid to distal superior vena cava. Lung volumes are low. No definite pneumothorax. There is cephalization of the pulmonary vasculature, indistinctness of the interstitial markings, and patchy airspace disease throughout the lungs bilaterally suggestive of moderate pulmonary edema. Small bilateral pleural effusions (left greater than right). Cardiomegaly. Upper mediastinal contours are within  normal limits allowing for patient's rotation to the right. Aortic atherosclerosis. IMPRESSION: 1. Support apparatus, as above. 2. No pneumothorax or other complicating features following central line placement. 3. The appearance of the chest suggests developing congestive heart failure, as above. 4. Aortic atherosclerosis. Electronically Signed   By: Vinnie Langton M.D.   On: 05/16/2016 12:26   Scheduled Meds: .  anidulafungin  100 mg Intravenous Q24H  . antiseptic oral rinse  7 mL Mouth Rinse q12n4p  . chlorhexidine  15 mL Mouth Rinse BID  . insulin aspart  0-15 Units Subcutaneous Q4H  . pantoprazole  40 mg Intravenous Q12H  . sodium chloride flush  3 mL Intravenous Q12H   Continuous Infusions: . Marland KitchenTPN (CLINIMIX-E) Adult 65 mL/hr at 05/09/2016 1734   And  . fat emulsion 240 mL (05/16/2016 1735)   PRN Meds:.acetaminophen, albuterol, hydrALAZINE, ondansetron, phenol  ASSESMENT:   *  Severe, necrotic esophagitis in setting of GOO.  Esophageal biopsies showed fungal elements, received/completed IV Diflucan and on chronic Protonix. Repeat EGD 8/31: severe reflux esophagitis.  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   *  Gastric mass/polyp.  Biopsied twice: hyperplastic.  Has EUS set for tomorrow 0730 at [facility].  *  Protein calorie malnutrition.  On TPN.    *  New sepsis.  Suspect aspiration PNA  Fungemia. On Anidulafungin.     *  CHF per xray.  Echo in progress.   *  Thrombocytopenia, non-critical.  Has had issues with this in past.    *   Normocytic anemia.     PLAN   *  Dr Rosendo Gros of surgery recommending post pyloric FT placement per radiology, continue TNA.  Possible pyloroplasty vs gastro-Jej once nutrition optimized.    *  IV BID Protonix remains in place  *  Await path from mass biopsy (hyperplastic on early 04/2016 biopsies).    *  GI will follow at a distance.       Azucena Freed  05/18/2016, 9:27 AM Pager: [phone]

## 2016-05-18 DEATH — deceased

## 2016-05-19 DIAGNOSIS — T827XXA Infection and inflammatory reaction due to other cardiac and vascular devices, implants and grafts, initial encounter: Secondary | ICD-10-CM

## 2016-05-19 DIAGNOSIS — Y712 Prosthetic and other implants, materials and accessory cardiovascular devices associated with adverse incidents: Secondary | ICD-10-CM

## 2016-05-19 DIAGNOSIS — Z978 Presence of other specified devices: Secondary | ICD-10-CM

## 2016-05-19 LAB — GLUCOSE, CAPILLARY
GLUCOSE-CAPILLARY: 168 mg/dL — AB (ref 65–99)
GLUCOSE-CAPILLARY: 150 mg/dL — AB (ref 65–99)
Glucose-Capillary: 136 mg/dL — ABNORMAL HIGH (ref 65–99)
Glucose-Capillary: 118 mg/dL — ABNORMAL HIGH (ref 65–99)
Glucose-Capillary: 149 mg/dL — ABNORMAL HIGH (ref 65–99)
Glucose-Capillary: 136 mg/dL — ABNORMAL HIGH (ref 65–99)

## 2016-05-19 LAB — BASIC METABOLIC PANEL
Anion gap: 4 — ABNORMAL LOW (ref 5–15)
BUN: 17 mg/dL (ref 6–20)
CO2: 25 mmol/L (ref 22–32)
Calcium: 7.5 mg/dL — ABNORMAL LOW (ref 8.9–10.3)
Chloride: 105 mmol/L (ref 101–111)
Creatinine, Ser: 0.66 mg/dL (ref 0.44–1.00)
GFR calc Af Amer: >60 mL/min (ref >60)
GFR calc non Af Amer: >60 mL/min (ref >60)
Glucose, Bld: 141 mg/dL — ABNORMAL HIGH (ref 65–99)
Potassium: 3.9 mmol/L (ref 3.5–5.1)
Sodium: 134 mmol/L — ABNORMAL LOW (ref 135–145)

## 2016-05-19 LAB — CULTURE, BLOOD (ROUTINE X 2)

## 2016-05-19 MED ORDER — TRACE MINERALS CR-CU-MN-SE-ZN 10-1000-500-60 MCG/ML IV SOLN
INTRAVENOUS | Status: AC
  Administered 2016-05-19: 18:00:00 via INTRAVENOUS
  Filled 2016-05-19: qty 1560

## 2016-05-19 MED ORDER — FAT EMULSION 20 % IV EMUL
240.0000 mL | INTRAVENOUS | Status: AC
  Administered 2016-05-19: 240 mL via INTRAVENOUS
  Filled 2016-05-19: qty 250

## 2016-05-19 NOTE — Progress Notes (Signed)
Spoke with [name] [name] in [place], she left me know patient could not have post pyloric coretrak placed on the weekend unless Md called and spoke with radiologist to ok it. MD [facility] text paged and notified with number to call radiologist.

## 2016-05-19 NOTE — Progress Notes (Signed)
PARENTERAL NUTRITION CONSULT NOTE - FOLLOW UP  Pharmacy Consult:  TPN Indication:  Gastric Outlet Obstruction  Allergies  Allergen Reactions  . Ace Inhibitors Cough  . Prednisone Other (See Comments)    LETHARGY  . Sitagliptin Other (See Comments)    Noted on [facility]    Patient Measurements: Height: 5\' 2"  (157.5 cm) Weight: 193 lb 2 oz (87.6 kg) IBW/kg (Calculated) : 50.1 Adjusted Body Weight: 75 kg Usual Weight: 80 kg  Vital Signs: Temp: 98.3 F (36.8 C) (09/02 0319) Temp Source: Oral (09/02 0319) BP: 136/53 (09/02 0319) Pulse Rate: 89 (09/02 0319) Intake/Output from previous day: 09/01 0701 - 09/02 0700 In: 1955.7 [I.V.:1825.7; IV Piggyback:130] Out: [id number] [Urine:1550] Intake/Output from this shift: No intake/output data recorded.  Insulin Requirements in the past 24 hours:  18 units moderate SSI + 30 units regular insulin in TPN  Assessment: 26 YOF recently admitted to [facility] with gastric outlet obstruction due to necrotic esophagitis and discharged on home Unasyn and TPN to [facility].  Admitted to [facility] 05/16/2016 with shortness of breath, fever, and confusion.  PICC line was removed on admission due to concern for line infection.  Per discussion with Dr. Thereasa Solo he suspects she had a transient aspiration event and is not continuing antibiotics at this time.  She is to continue nutrition support with TPN.  GI: GOO on chronic TPN. Albumin low at 1.3. Prealbumin low at 3.5. Last BM was 8/31.  EGD yesterday showed severe reflux esophagitis, a gastric tumor and gastric outlet obstruction. Got biopsy to rule out malignancy. GI recommends IR to place post-pyloric TFs soon. Endo: hx DM, was requiring 35 units of regular insulin in TPN during last admission and at [facility]. CBGs remain elevated (120-200s) with SSI + 30 units of insulin in TPN Lytes: hyponatremia prior to TPN initiation. WNL, K up to 3.9 today after replacement, Mg at 2.1 on 9/1. CoCa ok at 9.7 Renal: SCr stable, CrCL  ~54ml/min. UOP not all charted Pulm: transient aspiration event - reduced to 3L [place] Cards: VSS Hepatobil: LFTs normalized, TG WNL. Tbili 0.2. Neuro: some confusion noted ID: Blood cx detected candida glabrata. Started on anidulafungin for fungemia. Spoke with ID and since new PICC placed after positive blood cx the yare ok with continuing TPN for now. If repeat cx remains positive may have to consider line holiday. So far it is ngtd. Afebrile, WBC wnl Best Practices: Protonix IV, SCDs TPN Access: central line placed 05/16/16 TPN start date: chronic TPN patient  Current Nutrition:  NPO TPN   TPN at [facility]: (information obtained on 8/30 from [place] - pharmacist at [place]) Clinimix E 5/15 1440 ml + 20% lipids 192 ml 35 units of regular insulin ordered for each bag - per discussion there is question if this was actually being added to each bag at the facility  Nutritional Goals: per RD on 8/30 1500-1700 kCal 70-80 grams of protein per day  Plan:  Continue Clinimix E 5/15 at 51ml/hr Continue 20% lipid emulsion at 61ml/hr TPN provides 78 g of protein and 1560 kCals per day meeting 100% of patient needs Add MVI and TE in TPN Increase to 40 units of regular insulin in TPN Continue moderate SSI and adjust as needed Monitor TPN labs, repeat blood cx's (See ID section) F/U trial of post-pyloric TFs soon?  Elenor Quinones, PharmD, BCPS Clinical Pharmacist Pager [phone] 05/19/2016 7:06 AM

## 2016-05-19 NOTE — Progress Notes (Signed)
[place] for Infectious Disease   Reason for visit: Follow up on C glabrata picc line infection  Interval History: last fever 8/29, repeat blood cultures ngtd    Physical Exam: Constitutional:  Vitals:   05/19/16 0319 05/19/16 0800  BP: (!) 136/53 (!) 127/53  Pulse: 89 85  Resp: 19 19  Temp: 98.3 F (36.8 C) 99 F (37.2 C)   patient appears in NAD Respiratory: Normal respiratory effort; CTA B Cardiovascular: RRR GI: soft  Review of Systems: Constitutional: negative for fevers and chills Respiratory: negative for cough  Lab Results  Component Value Date   WBC 4.3 04/29/2016   HGB 7.1 (L) 04/19/2016   HCT 22.5 (L) 04/18/2016   MCV 85.9 04/17/2016   PLT 124 (L) 04/19/2016    Lab Results  Component Value Date   CREATININE 0.66 05/19/2016   BUN 17 05/19/2016   NA 134 (L) 05/19/2016   K 3.9 05/19/2016   CL 105 05/19/2016   CO2 25 05/19/2016    Lab Results  Component Value Date   ALT 30 04/18/2016   AST 41 05/16/2016   ALKPHOS 66 05/04/2016     Microbiology: Recent Results (from the past 240 hour(s))  Blood Culture (routine x 2)     Status: None (Preliminary result)   Collection Time: 04/17/2016 11:00 AM  Result Value Ref Range Status   Specimen Description BLOOD LEFT HAND  Final   Special Requests BOTTLES DRAWN AEROBIC AND ANAEROBIC  5CC  Final   Culture  Setup Time   Final    YEAST AEROBIC BOTTLE ONLY CRITICAL RESULT CALLED TO, READ BACK BY AND VERIFIED WITH: Colin Rhein, PHARM AT 1812 ON [id number] BY Rhea Bleacher    Culture YEAST CULTURE REINCUBATED FOR BETTER GROWTH   Final   Report Status PENDING  Incomplete  Blood Culture (routine x 2)     Status: Abnormal   Collection Time: 04/25/2016 11:13 AM  Result Value Ref Range Status   Specimen Description BLOOD RIGHT ANTECUBITAL  Final   Special Requests BOTTLES DRAWN AEROBIC AND ANAEROBIC  5CC  Final   Culture  Setup Time (A)  Final    YEAST AEROBIC BOTTLE ONLY CRITICAL RESULT CALLED TO, READ BACK BY  AND VERIFIED WITH: Colin Rhein, PHARM AT 1706 ON [id number] BY S. YARBROUGH    Culture CANDIDA GLABRATA (A)  Final   Report Status 05/19/2016 FINAL  Final  Blood Culture ID Panel (Reflexed)     Status: Abnormal   Collection Time: 05/16/2016 11:13 AM  Result Value Ref Range Status   Enterococcus species NOT DETECTED NOT DETECTED Final   Listeria monocytogenes NOT DETECTED NOT DETECTED Final   Staphylococcus species NOT DETECTED NOT DETECTED Final   Staphylococcus aureus NOT DETECTED NOT DETECTED Final   Streptococcus species NOT DETECTED NOT DETECTED Final   Streptococcus agalactiae NOT DETECTED NOT DETECTED Final   Streptococcus pneumoniae NOT DETECTED NOT DETECTED Final   Streptococcus pyogenes NOT DETECTED NOT DETECTED Final   Acinetobacter baumannii NOT DETECTED NOT DETECTED Final   Enterobacteriaceae species NOT DETECTED NOT DETECTED Final   Enterobacter cloacae complex NOT DETECTED NOT DETECTED Final   Escherichia coli NOT DETECTED NOT DETECTED Final   Klebsiella oxytoca NOT DETECTED NOT DETECTED Final   Klebsiella pneumoniae NOT DETECTED NOT DETECTED Final   Proteus species NOT DETECTED NOT DETECTED Final   Serratia marcescens NOT DETECTED NOT DETECTED Final   Haemophilus influenzae NOT DETECTED NOT DETECTED Final   Neisseria meningitidis NOT DETECTED  NOT DETECTED Final   Pseudomonas aeruginosa NOT DETECTED NOT DETECTED Final   Candida albicans NOT DETECTED NOT DETECTED Final   Candida glabrata DETECTED (A) NOT DETECTED Final    Comment: CRITICAL RESULT CALLED TO, READ BACK BY AND VERIFIED WITH: Michele Mcalpine AT 1706 ON [id number] BY S. YARBROUGH    Candida krusei NOT DETECTED NOT DETECTED Final   Candida parapsilosis NOT DETECTED NOT DETECTED Final   Candida tropicalis NOT DETECTED NOT DETECTED Final  Urine culture     Status: None   Collection Time: 05/03/2016  1:00 PM  Result Value Ref Range Status   Specimen Description URINE, CATHETERIZED  Final   Special Requests NONE  Final    Culture NO GROWTH  Final   Report Status 05/16/2016 FINAL  Final  MRSA PCR Screening     Status: None   Collection Time: 04/22/2016  6:54 PM  Result Value Ref Range Status   MRSA by PCR NEGATIVE NEGATIVE Final    Comment:        The GeneXpert MRSA Assay (FDA approved for NASAL specimens only), is one component of a comprehensive MRSA colonization surveillance program. It is not intended to diagnose MRSA infection nor to guide or monitor treatment for MRSA infections.   Culture, blood (routine x 2)     Status: None (Preliminary result)   Collection Time: 05/16/2016  6:19 PM  Result Value Ref Range Status   Specimen Description BLOOD LEFT HAND  Final   Special Requests IN PEDIATRIC BOTTLE 1.5CC  Final   Culture NO GROWTH < 24 HOURS  Final   Report Status PENDING  Incomplete  Culture, blood (routine x 2)     Status: None (Preliminary result)   Collection Time: 05/13/2016  6:36 PM  Result Value Ref Range Status   Specimen Description BLOOD RIGHT HAND  Final   Special Requests IN PEDIATRIC BOTTLE 0.5CC  Final   Culture NO GROWTH < 24 HOURS  Final   Report Status PENDING  Incomplete    Impression/Plan:  1. C glabrata line infection - on anidulafungin pending sensitivities.  Was previously on diflucan.  TTE negative for vegetation.   2. Nutrition - on TPN, ok to continue.

## 2016-05-19 NOTE — Progress Notes (Signed)
[facility] TEAM 1 - Stepdown/ICU TEAM  EARLEEN SMIKLE  [id number] DOB: Mar 30, 1935 DOA: 04/29/2016 PCP: [name], MD    Brief Narrative:  81 y.o. female with history of degenerative disc disease, DM, diverticulosis, GERD, hiatal hernia, HLD, HTN, and chronic hyponatremia who was admitted 04/14/2016 > 04/27/2016 for treatment of acute GI bleed with gastric outlet obstruction due to necrotic esophagitis with superficial fungal infection and ulceration with subsequent development of possible right-sided pneumonitis and aspiration pneumonia. Patient was discharged on Unasyn and Diflucan and completed her course on 05/14/2016.  A Left arm PICC was placed for home Unasyn and TPN. She returned to the ED c/o shortness of breath and fevers. In the ED she was noted to be confused.    Subjective: The patient is alert and conversant.  She is approaching her baseline mental status per her family in the room.  She denies nausea or vomiting at present.  She denies chest pain or shortness of breath.  We have spoken at length about her current active medical issues and her future plan for care.  Assessment & Plan:  Sepsis due to Candida glabrata fungemia - suspected PICC source  febrile at nursing home to 104.5 - UA unremarkable - chest x-ray improved compared to recent -  PICC removed at time of admission - has new CVL placed 8/30 - cont antifungal per ID recs - temp curve trending down   Acute hypoxic respiratory failure Likely transient pneumonitis due to aspiration v/s simple hypoventilation due to AMS - resolved  AMS likely multifactorial - appears to be improving   Esophageal necrosis / gastric outlet obstruction with bowel ulceration / gastric prepyloric polypoid mass  EGD this admit confirms peri-pyloric polypoid mass extending to pylorus and leading to obstruction - Gen Surg following w/ plans for eventual pyloroplasty vs. gastro-jej - suggestion is for IR to place post-pyloric feeding  tube for enteral feeds, though I suspect this will be quite difficult - keep current NG in place for suction until it can be replaced w/ a post-pyloric tube - continue TNA until enteral feeds an option  Chronic Hyponatremia stable  - follow trend   Hypokalemia Corrected - follow   Normocytic anemia Hemoglobin 8.4 - baseline at time of recent D/C ~9.0  - recent GI bleed - no evidence of large scale active bleeding - recheck hemoglobin in a.m.  HTN Follow w/o change in tx today   DM2 CBG reasonably controlled   DVT prophylaxis: SCDs Code Status: NO CODE - DNR Family Communication: Lengthy discussion with daughter and granddaughter at bedside Disposition Plan: SDU  Consultants:  GI Gen Surgery  ID  Procedures: 8/30 CVL insertion per PCCM 8/31 EGD - Severe esophagitis with diffuse ulceration and sloughing of mucosa from EG junction at 37cm to proximal esophagus / large, polypoid, non-circumferential subepithelial mass in the prepyloric region causing GOO  9/1 TTE - EF 60-65 % - no wall motion abnormalities  Antimicrobials:  Cefepime 8/29 Diflucan 8/29 Vancomycin 8/29  anidulafungin 8/31 >  Objective: Blood pressure (!) 142/61, pulse 95, temperature 97.6 F (36.4 C), temperature source Oral, resp. rate (!) 23, height 5\' 2"  (1.575 m), weight 87.6 kg (193 lb 2 oz), SpO2 95 %.  Intake/Output Summary (Last 24 hours) at 05/19/16 1510 Last data filed at 05/19/16 1409  Gross per 24 hour  Intake          2555.66 ml  Output             1500  ml  Net          1055.66 ml   Filed Weights   05/03/2016 1113 05/01/2016 1944 05/19/16 0319  Weight: 77.2 kg (170 lb 3.2 oz) 82.7 kg (182 lb 5.1 oz) 87.6 kg (193 lb 2 oz)    Examination: General: No acute respiratory distress - alert - more oriented Lungs: Clear to auscultation throughout without wheezes Cardiovascular: Regular rate and rhythm without murmur  Abdomen: Nontender, overweight, soft, bowel sounds positive, no rebound, no  ascites, no appreciable mass Extremities: No significant cyanosis, or clubbing;  2+ pedal edema bilateral lower extremities    CBC:  Recent Labs Lab 05/14/2016 1107 05/11/2016 1933 05/16/16 0721 05/01/2016 0745  WBC 8.7 6.6 7.2 4.3  NEUTROABS 7.8*  --  6.1  --   HGB 8.4* 7.3* 8.4* 7.1*  HCT 26.8* 23.8* 26.9* 22.5*  MCV 85.6 85.9 84.9 85.9  PLT 179 150 142* [id number]*   Basic Metabolic Panel:  Recent Labs Lab 04/26/2016 1933 05/16/16 0721 04/30/2016 0617 05/18/16 0316 05/19/16 0354  NA 132* 133* 133* 133* 134*  K 2.6* 3.6 3.2* 3.5 3.9  CL 95* 98* 100* 101 105  CO2 30 28 28 25 25   GLUCOSE 170* 102* 152* 154* 141*  BUN 23* 24* 20 20 17   CREATININE 0.70 0.76 0.74 0.69 0.66  CALCIUM 7.2* 7.3* 6.9* 7.2* 7.5*  MG 2.0 1.8 1.8 2.1  --   PHOS 2.6 2.6 2.5 2.5  --    GFR: Estimated Creatinine Clearance: 59.6 mL/min (by C-G formula based on SCr of 0.8 mg/dL).  Liver Function Tests:  Recent Labs Lab 04/18/2016 1107 05/16/16 0721 05/12/2016 0617  AST 40 67* 41  ALT 26 42 30  ALKPHOS 73 74 66  BILITOT 0.6 0.5 0.2*  PROT 4.8* 4.5* 3.9*  ALBUMIN 1.8* 1.6* 1.3*    Coagulation Profile:  Recent Labs Lab 04/29/2016 1107  INR 1.38    Cardiac Enzymes:  Recent Labs Lab 04/17/2016 1933  TROPONINI 0.06*    HbA1C: Hgb A1c MFr Bld  Date/Time Value Ref Range Status  04/15/2016 01:15 PM 6.6 (H) 4.8 - 5.6 % Final    Comment:    (NOTE)         Pre-diabetes: 5.7 - 6.4         Diabetes: >6.4         Glycemic control for adults with diabetes: <7.0     CBG:  Recent Labs Lab 05/18/16 1927 05/18/16 2347 05/19/16 0327 05/19/16 0744 05/19/16 1153  GLUCAP 208* 125* 136* 118* 149*    Recent Results (from the past 240 hour(s))  Blood Culture (routine x 2)     Status: Abnormal (Preliminary result)   Collection Time: 04/24/2016 11:00 AM  Result Value Ref Range Status   Specimen Description BLOOD LEFT HAND  Final   Special Requests BOTTLES DRAWN AEROBIC AND ANAEROBIC  5CC  Final    Culture  Setup Time   Final    YEAST AEROBIC BOTTLE ONLY CRITICAL RESULT CALLED TO, READ BACK BY AND VERIFIED WITH: Colin Rhein, PHARM AT 1812 ON [id number] BY Rhea Bleacher    Culture (A)  Final    CANDIDA GLABRATA WILL REFER TO [facility] FOR ANTIFUNGAL SUSCEPTIBILITIES PER PHYSICIAN REQUEST    Report Status PENDING  Incomplete  Blood Culture (routine x 2)     Status: Abnormal   Collection Time: 05/14/2016 11:13 AM  Result Value Ref Range Status   Specimen Description BLOOD RIGHT ANTECUBITAL  Final   Special  Requests BOTTLES DRAWN AEROBIC AND ANAEROBIC  5CC  Final   Culture  Setup Time (A)  Final    YEAST AEROBIC BOTTLE ONLY CRITICAL RESULT CALLED TO, READ BACK BY AND VERIFIED WITH: Colin Rhein, PHARM AT [place] ON [id number] BY S. YARBROUGH    Culture CANDIDA GLABRATA (A)  Final   Report Status 05/19/2016 FINAL  Final  Blood Culture ID Panel (Reflexed)     Status: Abnormal   Collection Time: 04/24/2016 11:13 AM  Result Value Ref Range Status   Enterococcus species NOT DETECTED NOT DETECTED Final   Listeria monocytogenes NOT DETECTED NOT DETECTED Final   Staphylococcus species NOT DETECTED NOT DETECTED Final   Staphylococcus aureus NOT DETECTED NOT DETECTED Final   Streptococcus species NOT DETECTED NOT DETECTED Final   Streptococcus agalactiae NOT DETECTED NOT DETECTED Final   Streptococcus pneumoniae NOT DETECTED NOT DETECTED Final   Streptococcus pyogenes NOT DETECTED NOT DETECTED Final   Acinetobacter baumannii NOT DETECTED NOT DETECTED Final   Enterobacteriaceae species NOT DETECTED NOT DETECTED Final   Enterobacter cloacae complex NOT DETECTED NOT DETECTED Final   Escherichia coli NOT DETECTED NOT DETECTED Final   Klebsiella oxytoca NOT DETECTED NOT DETECTED Final   Klebsiella pneumoniae NOT DETECTED NOT DETECTED Final   Proteus species NOT DETECTED NOT DETECTED Final   Serratia marcescens NOT DETECTED NOT DETECTED Final   Haemophilus influenzae NOT DETECTED NOT DETECTED Final   Neisseria  meningitidis NOT DETECTED NOT DETECTED Final   Pseudomonas aeruginosa NOT DETECTED NOT DETECTED Final   Candida albicans NOT DETECTED NOT DETECTED Final   Candida glabrata DETECTED (A) NOT DETECTED Final    Comment: CRITICAL RESULT CALLED TO, READ BACK BY AND VERIFIED WITH: Michele Mcalpine AT 1706 ON [id number] BY S. YARBROUGH    Candida krusei NOT DETECTED NOT DETECTED Final   Candida parapsilosis NOT DETECTED NOT DETECTED Final   Candida tropicalis NOT DETECTED NOT DETECTED Final  Urine culture     Status: None   Collection Time: 05/14/2016  1:00 PM  Result Value Ref Range Status   Specimen Description URINE, CATHETERIZED  Final   Special Requests NONE  Final   Culture NO GROWTH  Final   Report Status 05/16/2016 FINAL  Final  MRSA PCR Screening     Status: None   Collection Time: 05/14/2016  6:54 PM  Result Value Ref Range Status   MRSA by PCR NEGATIVE NEGATIVE Final    Comment:        The GeneXpert MRSA Assay (FDA approved for NASAL specimens only), is one component of a comprehensive MRSA colonization surveillance program. It is not intended to diagnose MRSA infection nor to guide or monitor treatment for MRSA infections.   Culture, blood (routine x 2)     Status: None (Preliminary result)   Collection Time: 04/19/2016  6:19 PM  Result Value Ref Range Status   Specimen Description BLOOD LEFT HAND  Final   Special Requests IN PEDIATRIC BOTTLE 1.5CC  Final   Culture NO GROWTH < 24 HOURS  Final   Report Status PENDING  Incomplete  Culture, blood (routine x 2)     Status: None (Preliminary result)   Collection Time: 04/26/2016  6:36 PM  Result Value Ref Range Status   Specimen Description BLOOD RIGHT HAND  Final   Special Requests IN PEDIATRIC BOTTLE 0.5CC  Final   Culture NO GROWTH < 24 HOURS  Final   Report Status PENDING  Incomplete     Scheduled Meds: . anidulafungin  100 mg Intravenous Q24H  . antiseptic oral rinse  7 mL Mouth Rinse q12n4p  . chlorhexidine  15 mL Mouth Rinse  BID  . insulin aspart  0-15 Units Subcutaneous Q4H  . pantoprazole  40 mg Intravenous Q12H  . sodium chloride flush  3 mL Intravenous Q12H   Continuous Infusions: . Marland KitchenTPN (CLINIMIX-E) Adult 65 mL/hr at 05/18/16 1754   And  . fat emulsion 240 mL (05/18/16 1754)  . Marland KitchenTPN (CLINIMIX-E) Adult     And  . fat emulsion       LOS: 4 days   Cherene Altes, MD [facility] Office  [phone] Pager - Text Page per Amion as per below:  On-Call/Text Page:      [email or web address]      password [email or web address]  If 7PM-7AM, please contact night-coverage [email or web address] Password [email or web address] 05/19/2016, 3:10 PM

## 2016-05-19 NOTE — Progress Notes (Signed)
[facility] Progress Note 2 Days Post-Op  Subjective: Patient is without complaints.  No distress  Objective: Vital signs in last 24 hours: Temp:  [98.2 F (36.8 C)-98.5 F (36.9 C)] 98.3 F (36.8 C) (09/02 0319) Pulse Rate:  [81-104] 89 (09/02 0319) Resp:  [17-24] 19 (09/02 0319) BP: (130-147)/(53-66) 136/53 (09/02 0319) SpO2:  [94 %-100 %] 97 % (09/02 0319) Weight:  [87.6 kg (193 lb 2 oz)] 87.6 kg (193 lb 2 oz) (09/02 0319) Last BM Date: 05/04/2016  Intake/Output from previous day: 09/01 0701 - 09/02 0700 In: 1955.7 [I.V.:1825.7; IV Piggyback:130] Out: [id number] [Urine:1550] Intake/Output this shift: No intake/output data recorded.  General: No acute distress.  On TPN.    Lungs: Clear.  No rales  Abd: Soft, distended, completely not tender.  NGT in place with minimal output  Extremities: Edematous and large.    Neuro: Intact.   Lab Results:  @LABLAST2 (wbc:2,hgb:2,hct:2,plt:2) BMET ) Recent Labs  05/18/16 0316 05/19/16 0354  NA 133* 134*  K 3.5 3.9  CL 101 105  CO2 25 25  GLUCOSE 154* 141*  BUN 20 17  CREATININE 0.69 0.66  CALCIUM 7.2* 7.5*   PT/INR No results for input(s): LABPROT, INR in the last 72 hours. ABG No results for input(s): PHART, HCO3 in the last 72 hours.  Invalid input(s): PCO2, PO2  Studies/Results: No results found.  Anti-infectives: Anti-infectives    Start     Dose/Rate Route Frequency Ordered Stop   05/18/16 1700  anidulafungin ([name]) 100 mg in sodium chloride 0.9 % 100 mL IVPB     100 mg over 90 Minutes Intravenous Every 24 hours 04/19/2016 1627     05/14/2016 1700  anidulafungin ([name]) 200 mg in sodium chloride 0.9 % 200 mL IVPB     200 mg over 180 Minutes Intravenous  Once 04/29/2016 1627 05/03/2016 2003   05/16/16 0200  vancomycin (VANCOCIN) IVPB 750 mg/150 ml premix  Status:  Discontinued     750 mg 150 mL/hr over 60 Minutes Intravenous Every 12 hours 05/02/2016 1409 05/16/16 1045   05/04/2016 1400  fluconazole (DIFLUCAN) IVPB 100 mg   Status:  Discontinued     100 mg 50 mL/hr over 60 Minutes Intravenous Every 24 hours 05/05/2016 1317 05/14/2016 1653   05/07/2016 1400  vancomycin (VANCOCIN) IVPB 1000 mg/200 mL premix  Status:  Discontinued     1,000 mg 200 mL/hr over 60 Minutes Intravenous  Once 04/21/2016 1353 05/08/2016 1407   05/04/2016 1330  vancomycin (VANCOCIN) IVPB 1000 mg/200 mL premix     1,000 mg 200 mL/hr over 60 Minutes Intravenous  Once 04/21/2016 1325 05/17/2016 1525   05/14/2016 1300  ceFEPIme (MAXIPIME) 2 g in dextrose 5 % 50 mL IVPB  Status:  Discontinued     2 g 100 mL/hr over 30 Minutes Intravenous Every 12 hours 05/04/2016 1257 05/16/16 1043      Assessment/Plan: s/p Procedure(s): ESOPHAGOGASTRODUODENOSCOPY (EGD) WITH PROPOFOL Continue TPN  Plans are to try to place post-pyloric feeding toube in place in radiology today. Eventually may need gastrojejunostomy  LOS: 4 days   Kathryne Eriksson. Dahlia Bailiff, MD, FACS [phone] [facility] Surgery 05/19/2016

## 2016-05-20 LAB — GLUCOSE, CAPILLARY
GLUCOSE-CAPILLARY: 195 mg/dL — AB (ref 65–99)
GLUCOSE-CAPILLARY: 145 mg/dL — AB (ref 65–99)
GLUCOSE-CAPILLARY: 156 mg/dL — AB (ref 65–99)
Glucose-Capillary: 166 mg/dL — ABNORMAL HIGH (ref 65–99)

## 2016-05-20 LAB — BASIC METABOLIC PANEL
ANION GAP: 3 — AB (ref 5–15)
BUN: 13 mg/dL (ref 6–20)
CHLORIDE: 111 mmol/L (ref 101–111)
CO2: 22 mmol/L (ref 22–32)
Calcium: 6.6 mg/dL — ABNORMAL LOW (ref 8.9–10.3)
Creatinine, Ser: 0.51 mg/dL (ref 0.44–1.00)
GFR calc non Af Amer: >60 mL/min (ref >60)
GLUCOSE: 143 mg/dL — AB (ref 65–99)
Potassium: 3.4 mmol/L — ABNORMAL LOW (ref 3.5–5.1)
Sodium: 136 mmol/L (ref 135–145)

## 2016-05-20 LAB — CBC
HEMATOCRIT: 20.2 % — AB (ref 36.0–46.0)
HEMOGLOBIN: 6.2 g/dL — AB (ref 12.0–15.0)
MCH: 26.8 pg (ref 26.0–34.0)
MCHC: 31.2 g/dL (ref 30.0–36.0)
MCV: 86 fL (ref 78.0–100.0)
Platelets: 140 10*3/uL — ABNORMAL LOW (ref 150–400)
RBC: 2.35 MIL/uL — ABNORMAL LOW (ref 3.87–5.11)
RDW: 14.5 % (ref 11.5–15.5)
WBC: 3.7 10*3/uL — AB (ref 4.0–10.5)

## 2016-05-20 LAB — PREPARE RBC (CROSSMATCH)

## 2016-05-20 MED ORDER — TRACE MINERALS CR-CU-MN-SE-ZN 10-1000-500-60 MCG/ML IV SOLN
INTRAVENOUS | Status: AC
  Administered 2016-05-20: 18:00:00 via INTRAVENOUS
  Filled 2016-05-20: qty 1560

## 2016-05-20 MED ORDER — FAT EMULSION 20 % IV EMUL
240.0000 mL | INTRAVENOUS | Status: AC
  Administered 2016-05-20: 240 mL via INTRAVENOUS
  Filled 2016-05-20: qty 250

## 2016-05-20 MED ORDER — SODIUM CHLORIDE 0.9 % IV SOLN
Freq: Once | INTRAVENOUS | Status: AC
  Administered 2016-05-20: 18:00:00 via INTRAVENOUS

## 2016-05-20 MED ORDER — POTASSIUM CHLORIDE 10 MEQ/50ML IV SOLN
10.0000 meq | INTRAVENOUS | Status: AC
Start: 2016-05-20 — End: 2016-05-20
  Administered 2016-05-20 (×4): 10 meq via INTRAVENOUS
  Filled 2016-05-20 (×4): qty 50

## 2016-05-20 NOTE — Progress Notes (Signed)
3 Days Post-Op  Subjective: No complaints Denies abdominal pain  Objective: Vital signs in last 24 hours: Temp:  [97.6 F (36.4 C)-100.7 F (38.2 C)] 97.6 F (36.4 C) (09/03 0246) Pulse Rate:  [81-95] 87 (09/03 0400) Resp:  [18-23] 19 (09/03 0400) BP: (140-159)/(50-67) 159/61 (09/03 0400) SpO2:  [92 %-95 %] 95 % (09/03 0400) Last BM Date: 04/25/2016  Intake/Output from previous day: 09/02 0701 - 09/03 0700 In: 1401.8 [I.V.:1401.8] Out: 1750 [Urine:1600; Emesis/NG output:150] Intake/Output this shift: No intake/output data recorded.  Awake and alert Abdomen soft, NT No blood in NG  Lab Results:   Recent Labs  05/20/16 0545  WBC 3.7*  HGB 6.2*  HCT 20.2*  PLT 140*   BMET  Recent Labs  05/19/16 0354 05/20/16 0545  NA 134* 136  K 3.9 3.4*  CL 105 111  CO2 25 22  GLUCOSE 141* 143*  BUN 17 13  CREATININE 0.66 0.51  CALCIUM 7.5* 6.6*   PT/INR No results for input(s): LABPROT, INR in the last 72 hours. ABG No results for input(s): PHART, HCO3 in the last 72 hours.  Invalid input(s): PCO2, PO2  Studies/Results: No results found.  Anti-infectives: Anti-infectives    Start     Dose/Rate Route Frequency Ordered Stop   05/18/16 1700  anidulafungin ([name]) 100 mg in sodium chloride 0.9 % 100 mL IVPB     100 mg over 90 Minutes Intravenous Every 24 hours 05/05/2016 1627     04/27/2016 1700  anidulafungin ([name]) 200 mg in sodium chloride 0.9 % 200 mL IVPB     200 mg over 180 Minutes Intravenous  Once 05/11/2016 1627 05/08/2016 2003   05/16/16 0200  vancomycin (VANCOCIN) IVPB 750 mg/150 ml premix  Status:  Discontinued     750 mg 150 mL/hr over 60 Minutes Intravenous Every 12 hours 04/24/2016 1409 05/16/16 1045   05/05/2016 1400  fluconazole (DIFLUCAN) IVPB 100 mg  Status:  Discontinued     100 mg 50 mL/hr over 60 Minutes Intravenous Every 24 hours 04/17/2016 1317 04/18/2016 1653   04/26/2016 1400  vancomycin (VANCOCIN) IVPB 1000 mg/200 mL premix  Status:  Discontinued     1,000 mg 200 mL/hr over 60 Minutes Intravenous  Once 05/08/2016 1353 04/25/2016 1407   05/06/2016 1330  vancomycin (VANCOCIN) IVPB 1000 mg/200 mL premix     1,000 mg 200 mL/hr over 60 Minutes Intravenous  Once 04/17/2016 1325 05/04/2016 1525   05/16/2016 1300  ceFEPIme (MAXIPIME) 2 g in dextrose 5 % 50 mL IVPB  Status:  Discontinued     2 g 100 mL/hr over 30 Minutes Intravenous Every 12 hours 05/09/2016 1257 05/16/16 1043      Assessment/Plan: s/p Procedure(s): ESOPHAGOGASTRODUODENOSCOPY (EGD) WITH PROPOFOL (N/A)  Continue NPO and NG. Needs post pyloric feeds Hgb down.  Will need PRBC's Still potential for OR this week for bypass of ulcer  LOS: 5 days    Michail Boyte A 05/20/2016

## 2016-05-20 NOTE — Progress Notes (Signed)
[facility] for Infectious Disease   Reason for visit: Follow up on C glabrata picc line infection  Interval History: Tmax 100.7, repeat blood cultures ngtd    Physical Exam: Constitutional:  Vitals:   05/20/16 1155 05/20/16 1200  BP: (!) 153/63 (!) 153/63  Pulse: 93 87  Resp: (!) 24 (!) 24  Temp: 98.7 F (37.1 C)    patient appears in NAD Respiratory: Normal respiratory effort; CTA B Cardiovascular: RRR GI: soft  Review of Systems: Constitutional: negative for fevers and chills Respiratory: negative for cough  Lab Results  Component Value Date   WBC 3.7 (L) 05/20/2016   HGB 6.2 (LL) 05/20/2016   HCT 20.2 (L) 05/20/2016   MCV 86.0 05/20/2016   PLT 140 (L) 05/20/2016    Lab Results  Component Value Date   CREATININE 0.51 05/20/2016   BUN 13 05/20/2016   NA 136 05/20/2016   K 3.4 (L) 05/20/2016   CL 111 05/20/2016   CO2 22 05/20/2016    Lab Results  Component Value Date   ALT 30 04/28/2016   AST 41 05/07/2016   ALKPHOS 66 05/01/2016     Microbiology: Recent Results (from the past 240 hour(s))  Blood Culture (routine x 2)     Status: Abnormal (Preliminary result)   Collection Time: 04/17/2016 11:00 AM  Result Value Ref Range Status   Specimen Description BLOOD LEFT HAND  Final   Special Requests BOTTLES DRAWN AEROBIC AND ANAEROBIC  5CC  Final   Culture  Setup Time   Final    YEAST AEROBIC BOTTLE ONLY CRITICAL RESULT CALLED TO, READ BACK BY AND VERIFIED WITH: Colin Rhein, PHARM AT 1812 ON [id number] BY S. YARBROUGH    Culture (A)  Final    CANDIDA GLABRATA WILL REFER TO [facility] FOR ANTIFUNGAL SUSCEPTIBILITIES PER PHYSICIAN REQUEST    Report Status PENDING  Incomplete  Blood Culture (routine x 2)     Status: Abnormal   Collection Time: 05/13/2016 11:13 AM  Result Value Ref Range Status   Specimen Description BLOOD RIGHT ANTECUBITAL  Final   Special Requests BOTTLES DRAWN AEROBIC AND ANAEROBIC  5CC  Final   Culture  Setup Time (A)  Final    YEAST AEROBIC  BOTTLE ONLY CRITICAL RESULT CALLED TO, READ BACK BY AND VERIFIED WITH: Colin Rhein, PHARM AT 1706 ON [id number] BY S. YARBROUGH    Culture CANDIDA GLABRATA (A)  Final   Report Status 05/19/2016 FINAL  Final  Blood Culture ID Panel (Reflexed)     Status: Abnormal   Collection Time: 05/14/2016 11:13 AM  Result Value Ref Range Status   Enterococcus species NOT DETECTED NOT DETECTED Final   Listeria monocytogenes NOT DETECTED NOT DETECTED Final   Staphylococcus species NOT DETECTED NOT DETECTED Final   Staphylococcus aureus NOT DETECTED NOT DETECTED Final   Streptococcus species NOT DETECTED NOT DETECTED Final   Streptococcus agalactiae NOT DETECTED NOT DETECTED Final   Streptococcus pneumoniae NOT DETECTED NOT DETECTED Final   Streptococcus pyogenes NOT DETECTED NOT DETECTED Final   Acinetobacter baumannii NOT DETECTED NOT DETECTED Final   Enterobacteriaceae species NOT DETECTED NOT DETECTED Final   Enterobacter cloacae complex NOT DETECTED NOT DETECTED Final   Escherichia coli NOT DETECTED NOT DETECTED Final   Klebsiella oxytoca NOT DETECTED NOT DETECTED Final   Klebsiella pneumoniae NOT DETECTED NOT DETECTED Final   Proteus species NOT DETECTED NOT DETECTED Final   Serratia marcescens NOT DETECTED NOT DETECTED Final   Haemophilus influenzae NOT DETECTED  NOT DETECTED Final   Neisseria meningitidis NOT DETECTED NOT DETECTED Final   Pseudomonas aeruginosa NOT DETECTED NOT DETECTED Final   Candida albicans NOT DETECTED NOT DETECTED Final   Candida glabrata DETECTED (A) NOT DETECTED Final    Comment: CRITICAL RESULT CALLED TO, READ BACK BY AND VERIFIED WITH: Michele Mcalpine AT 1706 ON [id number] BY S. YARBROUGH    Candida krusei NOT DETECTED NOT DETECTED Final   Candida parapsilosis NOT DETECTED NOT DETECTED Final   Candida tropicalis NOT DETECTED NOT DETECTED Final  Urine culture     Status: None   Collection Time: 05/16/2016  1:00 PM  Result Value Ref Range Status   Specimen Description URINE,  CATHETERIZED  Final   Special Requests NONE  Final   Culture NO GROWTH  Final   Report Status 05/16/2016 FINAL  Final  MRSA PCR Screening     Status: None   Collection Time: 05/13/2016  6:54 PM  Result Value Ref Range Status   MRSA by PCR NEGATIVE NEGATIVE Final    Comment:        The GeneXpert MRSA Assay (FDA approved for NASAL specimens only), is one component of a comprehensive MRSA colonization surveillance program. It is not intended to diagnose MRSA infection nor to guide or monitor treatment for MRSA infections.   Culture, blood (routine x 2)     Status: None (Preliminary result)   Collection Time: 05/12/2016  6:19 PM  Result Value Ref Range Status   Specimen Description BLOOD LEFT HAND  Final   Special Requests IN PEDIATRIC BOTTLE 1.5CC  Final   Culture NO GROWTH 2 DAYS  Final   Report Status PENDING  Incomplete  Culture, blood (routine x 2)     Status: None (Preliminary result)   Collection Time: 05/17/16  6:36 PM  Result Value Ref Range Status   Specimen Description BLOOD RIGHT HAND  Final   Special Requests IN PEDIATRIC BOTTLE 0.5CC  Final   Culture NO GROWTH 2 DAYS  Final   Report Status PENDING  Incomplete    Impression/Plan:  1. C glabrata line infection - on anidulafungin pending sensitivities.  Was previously on diflucan.  TTE negative for vegetation.   2. Nutrition - on TPN

## 2016-05-20 NOTE — Progress Notes (Signed)
[place] TEAM 1 - Stepdown/ICU TEAM  [name] [name]  [id number] DOB: 1934/11/17 DOA: 04/22/2016 PCP: [name], MD    Brief Narrative:  81 y.o. female with history of degenerative disc disease, DM, diverticulosis, GERD, hiatal hernia, HLD, HTN, and chronic hyponatremia who was admitted 04/14/2016 > 04/27/2016 for treatment of acute GI bleed with gastric outlet obstruction due to necrotic esophagitis with superficial fungal infection and ulceration with subsequent development of possible right-sided pneumonitis and aspiration pneumonia. Patient was discharged on Unasyn and Diflucan and completed her course on 05/14/2016.  A Left arm PICC was placed for home Unasyn and TPN. She returned to the ED c/o shortness of breath and fevers. In the ED she was noted to be confused.    Subjective: The patient is awake alert and conversant.  She is in good spirits.  She denies any complaints at this time.  Assessment & Plan:  Sepsis due to Candida glabrata fungemia - suspected PICC source  febrile at nursing home to 104.5 - UA unremarkable - chest x-ray improved compared to recent -  PICC removed at time of admission - has new CVL placed 8/30 - cont antifungal per ID recs - temp curve trending down   Normocytic anemia Hemoglobin 8.4 - baseline at time of recent D/C ~9.0  - recent GI bleed - no evidence of large scale active bleeding at present - suspect this represents poor production related to malnutrition as well as dilution from volume resuscitation - transfuse 2 units PRBC today  Esophageal necrosis / gastric outlet obstruction with bowel ulceration / gastric prepyloric polypoid mass  EGD this admit confirms peri-pyloric polypoid mass extending to pylorus and leading to obstruction - Gen Surg following w/ plans for eventual pyloroplasty vs. gastro-jej - suggestion is for IR to place post-pyloric feeding tube for enteral feeds, though I suspect this will be quite difficult - keep current NG  in place for suction until it can be replaced w/ a post-pyloric tube - continue TNA until enteral feeds an option - unfortunately radiology unable to place post-pyloric tube over weekend  Acute hypoxic respiratory failure Likely transient pneumonitis due to aspiration v/s simple hypoventilation due to AMS - resolved  AMS likely multifactorial - appears to have returned to her baseline mental status  Chronic Hyponatremia stable  - follow trend - sodium presently normal   Hypokalemia Correct further today - follow   HTN Follow w/o change in tx today   DM2 CBG reasonably controlled   DVT prophylaxis: SCDs Code Status: NO CODE - DNR Family Communication: No family present at time of exam today Disposition Plan: SDU  Consultants:  GI Gen Surgery  ID  Procedures: 8/30 CVL insertion per PCCM 8/31 EGD - Severe esophagitis with diffuse ulceration and sloughing of mucosa from EG junction at 37cm to proximal esophagus / large, polypoid, non-circumferential subepithelial mass in the prepyloric region causing GOO  9/1 TTE - EF 60-65 % - no wall motion abnormalities  Antimicrobials:  Cefepime 8/29 Diflucan 8/29 Vancomycin 8/29  anidulafungin 8/31 >  Objective: Blood pressure (!) 153/63, pulse 87, temperature 98.7 F (37.1 C), temperature source Axillary, resp. rate (!) 24, height 5\' 2"  (1.575 m), weight 87.6 kg (193 lb 2 oz), SpO2 96 %.  Intake/Output Summary (Last 24 hours) at 05/20/16 1507 Last data filed at 05/20/16 0800  Gross per 24 hour  Intake           801.75 ml  Output  1400 ml  Net          -598.25 ml   Filed Weights   04/23/2016 1113 05/03/2016 1944 05/19/16 0319  Weight: 77.2 kg (170 lb 3.2 oz) 82.7 kg (182 lb 5.1 oz) 87.6 kg (193 lb 2 oz)    Examination: General: No acute respiratory distress - alert And oriented Lungs: Clear to auscultation throughout  Cardiovascular: Regular rate and rhythm without murmur  Abdomen: Nontender, overweight, soft,  bowel sounds positive, no rebound, no ascites, no appreciable mass Extremities: No significant cyanosis, or clubbing;  2+ pedal edema B  CBC:  Recent Labs Lab 05/11/2016 1107 05/16/2016 1933 05/16/16 0721 05/03/2016 0745 05/20/16 0545  WBC 8.7 6.6 7.2 4.3 3.7*  NEUTROABS 7.8*  --  6.1  --   --   HGB 8.4* 7.3* 8.4* 7.1* 6.2*  HCT 26.8* 23.8* 26.9* 22.5* 20.2*  MCV 85.6 85.9 84.9 85.9 86.0  PLT 179 150 142* 124* [id number]*   Basic Metabolic Panel:  Recent Labs Lab 04/21/2016 1933 05/16/16 0721 05/12/2016 0617 05/18/16 0316 05/19/16 0354 05/20/16 0545  NA 132* 133* 133* 133* 134* 136  K 2.6* 3.6 3.2* 3.5 3.9 3.4*  CL 95* 98* 100* 101 105 111  CO2 30 28 28 25 25 22   GLUCOSE 170* 102* 152* 154* 141* 143*  BUN 23* 24* 20 20 17 13   CREATININE 0.70 0.76 0.74 0.69 0.66 0.51  CALCIUM 7.2* 7.3* 6.9* 7.2* 7.5* 6.6*  MG 2.0 1.8 1.8 2.1  --   --   PHOS 2.6 2.6 2.5 2.5  --   --    GFR: Estimated Creatinine Clearance: 59.6 mL/min (by C-G formula based on SCr of 0.8 mg/dL).  Liver Function Tests:  Recent Labs Lab 05/01/2016 1107 05/16/16 0721 05/01/2016 0617  AST 40 67* 41  ALT 26 42 30  ALKPHOS 73 74 66  BILITOT 0.6 0.5 0.2*  PROT 4.8* 4.5* 3.9*  ALBUMIN 1.8* 1.6* 1.3*    Coagulation Profile:  Recent Labs Lab 04/21/2016 1107  INR 1.38    Cardiac Enzymes:  Recent Labs Lab 05/14/2016 1933  TROPONINI 0.06*    HbA1C: Hgb A1c MFr Bld  Date/Time Value Ref Range Status  04/15/2016 01:15 PM 6.6 (H) 4.8 - 5.6 % Final    Comment:    (NOTE)         Pre-diabetes: 5.7 - 6.4         Diabetes: >6.4         Glycemic control for adults with diabetes: <7.0     CBG:  Recent Labs Lab 05/19/16 1606 05/19/16 1914 05/19/16 2318 05/20/16 0748 05/20/16 1153  GLUCAP 168* 136* 150* 166* 195*    Recent Results (from the past 240 hour(s))  Blood Culture (routine x 2)     Status: Abnormal (Preliminary result)   Collection Time: 05/11/2016 11:00 AM  Result Value Ref Range Status    Specimen Description BLOOD LEFT HAND  Final   Special Requests BOTTLES DRAWN AEROBIC AND ANAEROBIC  5CC  Final   Culture  Setup Time   Final    YEAST AEROBIC BOTTLE ONLY CRITICAL RESULT CALLED TO, READ BACK BY AND VERIFIED WITH: Colin Rhein, PHARM AT 1812 ON [id number] BY Rhea Bleacher    Culture (A)  Final    CANDIDA GLABRATA WILL REFER TO [facility] FOR ANTIFUNGAL SUSCEPTIBILITIES PER PHYSICIAN REQUEST    Report Status PENDING  Incomplete  Blood Culture (routine x 2)     Status: Abnormal   Collection Time:  05/16/2016 11:13 AM  Result Value Ref Range Status   Specimen Description BLOOD RIGHT ANTECUBITAL  Final   Special Requests BOTTLES DRAWN AEROBIC AND ANAEROBIC  5CC  Final   Culture  Setup Time (A)  Final    YEAST AEROBIC BOTTLE ONLY CRITICAL RESULT CALLED TO, READ BACK BY AND VERIFIED WITH: Colin Rhein, PHARM AT [place] ON [id number] BY S. YARBROUGH    Culture CANDIDA GLABRATA (A)  Final   Report Status 05/19/2016 FINAL  Final  Blood Culture ID Panel (Reflexed)     Status: Abnormal   Collection Time: 05/01/2016 11:13 AM  Result Value Ref Range Status   Enterococcus species NOT DETECTED NOT DETECTED Final   Listeria monocytogenes NOT DETECTED NOT DETECTED Final   Staphylococcus species NOT DETECTED NOT DETECTED Final   Staphylococcus aureus NOT DETECTED NOT DETECTED Final   Streptococcus species NOT DETECTED NOT DETECTED Final   Streptococcus agalactiae NOT DETECTED NOT DETECTED Final   Streptococcus pneumoniae NOT DETECTED NOT DETECTED Final   Streptococcus pyogenes NOT DETECTED NOT DETECTED Final   Acinetobacter baumannii NOT DETECTED NOT DETECTED Final   Enterobacteriaceae species NOT DETECTED NOT DETECTED Final   Enterobacter cloacae complex NOT DETECTED NOT DETECTED Final   Escherichia coli NOT DETECTED NOT DETECTED Final   Klebsiella oxytoca NOT DETECTED NOT DETECTED Final   Klebsiella pneumoniae NOT DETECTED NOT DETECTED Final   Proteus species NOT DETECTED NOT DETECTED Final   Serratia  marcescens NOT DETECTED NOT DETECTED Final   Haemophilus influenzae NOT DETECTED NOT DETECTED Final   Neisseria meningitidis NOT DETECTED NOT DETECTED Final   Pseudomonas aeruginosa NOT DETECTED NOT DETECTED Final   Candida albicans NOT DETECTED NOT DETECTED Final   Candida glabrata DETECTED (A) NOT DETECTED Final    Comment: CRITICAL RESULT CALLED TO, READ BACK BY AND VERIFIED WITH: Michele Mcalpine AT 1706 ON [id number] BY S. YARBROUGH    Candida krusei NOT DETECTED NOT DETECTED Final   Candida parapsilosis NOT DETECTED NOT DETECTED Final   Candida tropicalis NOT DETECTED NOT DETECTED Final  Urine culture     Status: None   Collection Time: 04/24/2016  1:00 PM  Result Value Ref Range Status   Specimen Description URINE, CATHETERIZED  Final   Special Requests NONE  Final   Culture NO GROWTH  Final   Report Status 05/16/2016 FINAL  Final  MRSA PCR Screening     Status: None   Collection Time: 05/05/2016  6:54 PM  Result Value Ref Range Status   MRSA by PCR NEGATIVE NEGATIVE Final    Comment:        The GeneXpert MRSA Assay (FDA approved for NASAL specimens only), is one component of a comprehensive MRSA colonization surveillance program. It is not intended to diagnose MRSA infection nor to guide or monitor treatment for MRSA infections.   Culture, blood (routine x 2)     Status: None (Preliminary result)   Collection Time: 04/30/2016  6:19 PM  Result Value Ref Range Status   Specimen Description BLOOD LEFT HAND  Final   Special Requests IN PEDIATRIC BOTTLE 1.5CC  Final   Culture NO GROWTH 2 DAYS  Final   Report Status PENDING  Incomplete  Culture, blood (routine x 2)     Status: None (Preliminary result)   Collection Time: 05/10/2016  6:36 PM  Result Value Ref Range Status   Specimen Description BLOOD RIGHT HAND  Final   Special Requests IN PEDIATRIC BOTTLE 0.5CC  Final   Culture NO GROWTH  2 DAYS  Final   Report Status PENDING  Incomplete     Scheduled Meds: . anidulafungin  100  mg Intravenous Q24H  . antiseptic oral rinse  7 mL Mouth Rinse q12n4p  . chlorhexidine  15 mL Mouth Rinse BID  . insulin aspart  0-15 Units Subcutaneous Q4H  . pantoprazole  40 mg Intravenous Q12H  . potassium chloride  10 mEq Intravenous Q1 Hr x 4  . sodium chloride flush  3 mL Intravenous Q12H   Continuous Infusions: . Marland KitchenTPN (CLINIMIX-E) Adult 65 mL/hr at 05/19/16 1747   And  . fat emulsion 240 mL (05/19/16 1746)  . Marland KitchenTPN (CLINIMIX-E) Adult     And  . fat emulsion       LOS: 5 days   Cherene Altes, MD [facility] Office  [phone] Pager - Text Page per Amion as per below:  On-Call/Text Page:      [email or web address]      password [email or web address]  If 7PM-7AM, please contact night-coverage [email or web address] Password [email or web address] 05/20/2016, 3:07 PM

## 2016-05-20 NOTE — Progress Notes (Signed)
PARENTERAL NUTRITION CONSULT NOTE - FOLLOW UP  Pharmacy Consult:  TPN Indication:  Gastric Outlet Obstruction  Allergies  Allergen Reactions  . Ace Inhibitors Cough  . Prednisone Other (See Comments)    LETHARGY  . Sitagliptin Other (See Comments)    Noted on [facility]   Patient Measurements: Height: 5\' 2"  (157.5 cm) Weight: 193 lb 2 oz (87.6 kg) IBW/kg (Calculated) : 50.1 Adjusted Body Weight: 75 kg Usual Weight: 80 kg  Vital Signs: Temp: 97.6 F (36.4 C) (09/03 0246) Temp Source: Oral (09/03 0246) BP: 159/61 (09/03 0400) Pulse Rate: 87 (09/03 0400) Intake/Output from previous day: 09/02 0701 - 09/03 0700 In: 1401.8 [I.V.:1401.8] Out: 1750 [Urine:1600; Emesis/NG output:150] Intake/Output from this shift: No intake/output data recorded.  Insulin Requirements in the past 24 hours:  12 units moderate SSI + 40 units regular insulin in TPN  Assessment: 54 YOF recently admitted to [facility] with gastric outlet obstruction due to necrotic esophagitis and discharged on home Unasyn and TPN to [facility].  Admitted to [facility] 05/14/2016 with shortness of breath, fever, and confusion.  PICC line was removed on admission due to concern for line infection.  Per discussion with Dr. Thereasa Solo he suspects she had a transient aspiration event and is not continuing antibiotics at this time.  She is to continue nutrition support with TPN.  GI: GOO on chronic TPN. Albumin low at 1.3. Prealbumin low at 3.5. Last BM was 8/31.  EGD yesterday showed severe reflux esophagitis, a gastric tumor and gastric outlet obstruction. Got biopsy to rule out malignancy. GI recommends IR to place post-pyloric TFs soon. Endo: hx DM, was requiring 35 units of regular insulin in TPN during last admission and at [facility]. CBGs are now controlled (110-160s) with SSI + 40 units of insulin in TPN Lytes: hyponatremia prior to TPN initiation. wnl exc for K down to 3.4, Mg at 2.1 and Phos 2.5 on 9/1. CoCa 8.8 Renal: SCr stable, CrCL  ~53ml/min. UOP not all charted Pulm: transient aspiration event - reduced to 3L [place] Cards: VSS Hepatobil: LFTs normalized, TG WNL. Tbili 0.2. Neuro: some confusion noted ID: Blood cx detected candida glabrata. Started on anidulafungin for fungemia. Spoke with ID and since new PICC placed after positive blood cx the yare ok with continuing TPN for now. If repeat cx remains positive may have to consider line holiday. So far it is ngtd. Afebrile, WBC wnl Best Practices: Protonix IV, SCDs TPN Access: central line placed 05/16/16 TPN start date: chronic TPN patient  Current Nutrition:  NPO TPN   TPN at [facility]: (information obtained on 8/30 from [place] - pharmacist at [place]) Clinimix E 5/15 1440 ml + 20% lipids 192 ml 35 units of regular insulin ordered for each bag - per discussion there is question if this was actually being added to each bag at the facility  Nutritional Goals: per RD on 8/30 1500-1700 kCal 70-80 grams of protein per day  Plan:  Continue Clinimix E 5/15 at 30ml/hr Continue 20% lipid emulsion at 76ml/hr TPN provides 78 g of protein and 1560 kCals per day meeting 100% of patient needs Add MVI and TE in TPN Continue 40 units of regular insulin in TPN Continue moderate SSI and adjust as needed Monitor TPN labs, repeat blood cx's (See ID section) F/U trial of post-pyloric TFs soon?  Elenor Quinones, PharmD, BCPS Clinical Pharmacist Pager [phone] 05/20/2016 7:47 AM

## 2016-05-21 LAB — CBC
HCT: 32.7 % — ABNORMAL LOW (ref 36.0–46.0)
HEMOGLOBIN: 10.5 g/dL — AB (ref 12.0–15.0)
MCH: 26.7 pg (ref 26.0–34.0)
MCHC: 32.1 g/dL (ref 30.0–36.0)
MCV: 83.2 fL (ref 78.0–100.0)
Platelets: 182 10*3/uL (ref 150–400)
RBC: 3.93 MIL/uL (ref 3.87–5.11)
RDW: 15.1 % (ref 11.5–15.5)
WBC: 4.7 10*3/uL (ref 4.0–10.5)

## 2016-05-21 LAB — COMPREHENSIVE METABOLIC PANEL
ALBUMIN: 1.7 g/dL — AB (ref 3.5–5.0)
ALT: 22 U/L (ref 14–54)
AST: 27 U/L (ref 15–41)
Alkaline Phosphatase: 74 U/L (ref 38–126)
Anion gap: 4 — ABNORMAL LOW (ref 5–15)
BUN: 14 mg/dL (ref 6–20)
CHLORIDE: 105 mmol/L (ref 101–111)
CO2: 25 mmol/L (ref 22–32)
Calcium: 7.8 mg/dL — ABNORMAL LOW (ref 8.9–10.3)
Creatinine, Ser: 0.68 mg/dL (ref 0.44–1.00)
GFR calc Af Amer: >60 mL/min (ref >60)
GLUCOSE: 109 mg/dL — AB (ref 65–99)
POTASSIUM: 4.2 mmol/L (ref 3.5–5.1)
SODIUM: 134 mmol/L — AB (ref 135–145)
Total Bilirubin: 0.6 mg/dL (ref 0.3–1.2)
Total Protein: 4.6 g/dL — ABNORMAL LOW (ref 6.5–8.1)

## 2016-05-21 LAB — TYPE AND SCREEN
ABO/RH(D): A POS
ANTIBODY SCREEN: NEGATIVE
Unit division: 0
Unit division: 0

## 2016-05-21 LAB — DIFFERENTIAL
Basophils Absolute: 0 10*3/uL (ref 0.0–0.1)
Basophils Relative: 0 %
EOS PCT: 4 %
Eosinophils Absolute: 0.2 10*3/uL (ref 0.0–0.7)
LYMPHS ABS: 1.4 10*3/uL (ref 0.7–4.0)
LYMPHS PCT: 29 %
Monocytes Absolute: 0.5 10*3/uL (ref 0.1–1.0)
Monocytes Relative: 10 %
NEUTROS PCT: 56 %
Neutro Abs: 2.6 10*3/uL (ref 1.7–7.7)

## 2016-05-21 LAB — GLUCOSE, CAPILLARY
GLUCOSE-CAPILLARY: 110 mg/dL — AB (ref 65–99)
GLUCOSE-CAPILLARY: 131 mg/dL — AB (ref 65–99)
GLUCOSE-CAPILLARY: 145 mg/dL — AB (ref 65–99)
Glucose-Capillary: 109 mg/dL — ABNORMAL HIGH (ref 65–99)
Glucose-Capillary: 141 mg/dL — ABNORMAL HIGH (ref 65–99)
Glucose-Capillary: 108 mg/dL — ABNORMAL HIGH (ref 65–99)
Glucose-Capillary: 183 mg/dL — ABNORMAL HIGH (ref 65–99)

## 2016-05-21 LAB — MAGNESIUM: MAGNESIUM: 1.4 mg/dL — AB (ref 1.7–2.4)

## 2016-05-21 LAB — TRIGLYCERIDES: TRIGLYCERIDES: 69 mg/dL (ref <150)

## 2016-05-21 LAB — PREALBUMIN: Prealbumin: 8.1 mg/dL — ABNORMAL LOW (ref 18–38)

## 2016-05-21 LAB — PHOSPHORUS: Phosphorus: 3.3 mg/dL (ref 2.5–4.6)

## 2016-05-21 MED ORDER — HYDRALAZINE HCL 20 MG/ML IJ SOLN
5.0000 mg | Freq: Four times a day (QID) | INTRAMUSCULAR | Status: DC
  Administered 2016-05-21 – 2016-05-27 (×21): 5 mg via INTRAVENOUS
  Filled 2016-05-21 (×23): qty 1

## 2016-05-21 MED ORDER — TRACE MINERALS CR-CU-MN-SE-ZN 10-1000-500-60 MCG/ML IV SOLN
INTRAVENOUS | Status: AC
  Administered 2016-05-21 – 2016-05-23 (×2): via INTRAVENOUS
  Filled 2016-05-21: qty 1560

## 2016-05-21 MED ORDER — FAT EMULSION 20 % IV EMUL
240.0000 mL | INTRAVENOUS | Status: AC
  Administered 2016-05-21 – 2016-05-23 (×2): 240 mL via INTRAVENOUS
  Filled 2016-05-21: qty 250

## 2016-05-21 MED ORDER — MAGNESIUM SULFATE 2 GM/50ML IV SOLN
2.0000 g | Freq: Once | INTRAVENOUS | Status: AC
  Administered 2016-05-21: 2 g via INTRAVENOUS
  Filled 2016-05-21: qty 50

## 2016-05-21 MED ORDER — FUROSEMIDE 10 MG/ML IJ SOLN
40.0000 mg | Freq: Once | INTRAMUSCULAR | Status: AC
  Administered 2016-05-21: 40 mg via INTRAVENOUS
  Filled 2016-05-21: qty 4

## 2016-05-21 NOTE — Progress Notes (Signed)
[facility] Progress Note 4 Days Post-Op  Subjective: Patient is asleep.  Has not gotten post-pyloric FT because it is not emergency.  Objective: Vital signs in last 24 hours: Temp:  [97.9 F (36.6 C)-98.9 F (37.2 C)] 98.9 F (37.2 C) (09/04 0814) Pulse Rate:  [80-93] 88 (09/04 0600) Resp:  [19-24] 19 (09/04 0600) BP: (134-177)/(53-80) 177/68 (09/04 0814) SpO2:  [91 %-99 %] 94 % (09/04 0600) Last BM Date: 05/20/16  Intake/Output from previous day: 09/03 0701 - 09/04 0700 In: 2686.3 [I.V.:1656.3; Blood:700; IV Piggyback:330] Out: 3200 [Urine:3200] Intake/Output this shift: Total I/O In: -  Out: 350 [Urine:350]  General: No distress.  Sleeping comfortably.  Minimal output recorded from NGT.  Seems kind of odd..  Lungs: clear  Abd: Soft, good bowel sounds.  Extremities: No changes  Neuro: Calm, not delirious.  Pleasantly confused at times.  Lab Results:  @LABLAST2 (wbc:2,hgb:2,hct:2,plt:2) BMET ) Recent Labs  05/20/16 0545 05/21/16 0240  NA 136 134*  K 3.4* 4.2  CL 111 105  CO2 22 25  GLUCOSE 143* 109*  BUN 13 14  CREATININE 0.51 0.68  CALCIUM 6.6* 7.8*   PT/INR No results for input(s): LABPROT, INR in the last 72 hours. ABG No results for input(s): PHART, HCO3 in the last 72 hours.  Invalid input(s): PCO2, PO2  Studies/Results: No results found.  Anti-infectives: Anti-infectives    Start     Dose/Rate Route Frequency Ordered Stop   05/18/16 1700  anidulafungin ([name]) 100 mg in sodium chloride 0.9 % 100 mL IVPB     100 mg over 90 Minutes Intravenous Every 24 hours 05/09/2016 1627     04/28/2016 1700  anidulafungin ([name]) 200 mg in sodium chloride 0.9 % 200 mL IVPB     200 mg over 180 Minutes Intravenous  Once 05/06/2016 1627 05/15/2016 2003   05/16/16 0200  vancomycin (VANCOCIN) IVPB 750 mg/150 ml premix  Status:  Discontinued     750 mg 150 mL/hr over 60 Minutes Intravenous Every 12 hours 04/25/2016 1409 05/16/16 1045   04/17/2016 1400  fluconazole  (DIFLUCAN) IVPB 100 mg  Status:  Discontinued     100 mg 50 mL/hr over 60 Minutes Intravenous Every 24 hours 05/09/2016 1317 05/08/2016 1653   05/09/2016 1400  vancomycin (VANCOCIN) IVPB 1000 mg/200 mL premix  Status:  Discontinued     1,000 mg 200 mL/hr over 60 Minutes Intravenous  Once 05/08/2016 1353 05/17/2016 1407   05/03/2016 1330  vancomycin (VANCOCIN) IVPB 1000 mg/200 mL premix     1,000 mg 200 mL/hr over 60 Minutes Intravenous  Once 04/30/2016 1325 05/03/2016 1525   04/25/2016 1300  ceFEPIme (MAXIPIME) 2 g in dextrose 5 % 50 mL IVPB  Status:  Discontinued     2 g 100 mL/hr over 30 Minutes Intravenous Every 12 hours 04/29/2016 1257 05/16/16 1043      Assessment/Plan: s/p Procedure(s): ESOPHAGOGASTRODUODENOSCOPY (EGD) WITH PROPOFOL No changes for today.  Should get postpyloric feeding tube soon.  LOS: 6 days   Kathryne Eriksson. Dahlia Bailiff, MD, FACS [phone] [facility] 05/21/2016

## 2016-05-21 NOTE — Progress Notes (Signed)
[place] TEAM 1 - Stepdown/ICU TEAM  [name] [name]  [id number] DOB: 14-Aug-1935 DOA: 04/23/2016 PCP: [name], MD    Brief Narrative:  81 y.o. female with history of degenerative disc disease, DM, diverticulosis, GERD, hiatal hernia, HLD, HTN, and chronic hyponatremia who was admitted 04/14/2016 > 04/27/2016 for treatment of acute GI bleed with gastric outlet obstruction due to necrotic esophagitis with superficial fungal infection and ulceration with subsequent development of possible right-sided pneumonitis and aspiration pneumonia. Patient was discharged on Unasyn and Diflucan and completed her course on 05/14/2016.  A Left arm PICC was placed for home Unasyn and TPN. She returned to the ED c/o shortness of breath and fevers. In the ED she was noted to be confused.    Subjective: The patient is without new complaints today.  She denies shortness of breath chest pain fevers chills nausea or vomiting.  Assessment & Plan:  Sepsis due to Candida glabrata fungemia - suspected PICC source  febrile at nursing home to 104.5 - UA unremarkable - chest x-ray improved compared to recent -  PICC removed at time of admission - new CVL placed 8/30 - cont antifungal per ID recs - sepsis physiology has resolved  Normocytic anemia Hemoglobin 8.4 - baseline at time of recent D/C ~9.0  - recent GI bleed - no evidence of large scale active bleeding - suspect drop represents poor production related to malnutrition as well as dilution from volume resuscitation - transfused 2 units PRBC 9/3 - hemoglobin dramatically improved - follow trend  Esophageal necrosis / gastric outlet obstruction with bowel ulceration / gastric prepyloric polypoid mass  EGD this admit confirms peri-pyloric polypoid mass extending to pylorus and leading to obstruction - Gen Surg following w/ plans for eventual pyloroplasty vs. gastro-jej - suggestion is for IR to place post-pyloric feeding tube for enteral feeds, though I  suspect this will be quite difficult - keep current NG in place for suction until it can be replaced w/ a post-pyloric tube - continue TNA until enteral feeds an option - unfortunately radiology unable to place post-pyloric tube over weekend/holiday  Acute hypoxic respiratory failure Likely transient pneumonitis due to aspiration v/s simple hypoventilation due to AMS - resolved  AMS likely multifactorial - appears to have returned to her baseline mental status  Chronic Hyponatremia stable  - follow trend - sodium stable   Hypokalemia Corrected to target range  HTN Blood pressure trending upward/poorly controlled - adjust medical therapy and follow  DM2 CBG reasonably controlled   DVT prophylaxis: SCDs Code Status: NO CODE - DNR Family Communication: No family present at time of exam today Disposition Plan: SDU until feeding tube placed/attempted   Consultants:  GI Gen Surgery  ID  Procedures: 8/30 CVL insertion per PCCM 8/31 EGD - Severe esophagitis with diffuse ulceration and sloughing of mucosa from EG junction at 37cm to proximal esophagus / large, polypoid, non-circumferential subepithelial mass in the prepyloric region causing GOO  9/1 TTE - EF 60-65 % - no wall motion abnormalities  Antimicrobials:  Cefepime 8/29 Diflucan 8/29 Vancomycin 8/29  anidulafungin 8/31 >  Objective: Blood pressure (!) 177/68, pulse 88, temperature 97.6 F (36.4 C), temperature source Oral, resp. rate 19, height 5\' 2"  (1.575 m), weight 87.6 kg (193 lb 2 oz), SpO2 94 %.  Intake/Output Summary (Last 24 hours) at 05/21/16 1537 Last data filed at 05/21/16 1409  Gross per 24 hour  Intake          2486.25 ml  Output  5100 ml  Net         -2613.75 ml   Filed Weights   04/30/2016 1113 04/30/2016 1944 05/19/16 0319  Weight: 77.2 kg (170 lb 3.2 oz) 82.7 kg (182 lb 5.1 oz) 87.6 kg (193 lb 2 oz)    Examination: General: No acute respiratory distress - alert/oriented Lungs: Clear  to auscultation throughout - no wheeze  Cardiovascular: Regular rate and rhythm without murmur  Abdomen: Nontender, overweight, soft, bowel sounds positive, no appreciable mass Extremities: No significant cyanosis, or clubbing;  2+ pedal edema B - 1+ edema hands B  CBC:  Recent Labs Lab 04/20/2016 1107 05/14/2016 1933 05/16/16 0721 04/22/2016 0745 05/20/16 0545 05/21/16 0240  WBC 8.7 6.6 7.2 4.3 3.7* 4.7  NEUTROABS 7.8*  --  6.1  --   --  2.6  HGB 8.4* 7.3* 8.4* 7.1* 6.2* 10.5*  HCT 26.8* 23.8* 26.9* 22.5* 20.2* 32.7*  MCV 85.6 85.9 84.9 85.9 86.0 83.2  PLT 179 150 142* 124* 140* [id number]   Basic Metabolic Panel:  Recent Labs Lab 05/08/2016 1933 05/16/16 0721 05/12/2016 0617 05/18/16 0316 05/19/16 0354 05/20/16 0545 05/21/16 0240  NA 132* 133* 133* 133* 134* 136 134*  K 2.6* 3.6 3.2* 3.5 3.9 3.4* 4.2  CL 95* 98* 100* 101 105 111 105  CO2 30 28 28 25 25 22 25   GLUCOSE 170* 102* 152* 154* 141* 143* 109*  BUN 23* 24* 20 20 17 13 14   CREATININE 0.70 0.76 0.74 0.69 0.66 0.51 0.68  CALCIUM 7.2* 7.3* 6.9* 7.2* 7.5* 6.6* 7.8*  MG 2.0 1.8 1.8 2.1  --   --  1.4*  PHOS 2.6 2.6 2.5 2.5  --   --  3.3   GFR: Estimated Creatinine Clearance: 59.6 mL/min (by C-G formula based on SCr of 0.8 mg/dL).  Liver Function Tests:  Recent Labs Lab 04/25/2016 1107 05/16/16 0721 05/09/2016 0617 05/21/16 0240  AST 40 67* 41 27  ALT 26 42 30 22  ALKPHOS 73 74 66 74  BILITOT 0.6 0.5 0.2* 0.6  PROT 4.8* 4.5* 3.9* 4.6*  ALBUMIN 1.8* 1.6* 1.3* 1.7*    Coagulation Profile:  Recent Labs Lab 04/18/2016 1107  INR 1.38    Cardiac Enzymes:  Recent Labs Lab 05/01/2016 1933  TROPONINI 0.06*    HbA1C: Hgb A1c MFr Bld  Date/Time Value Ref Range Status  04/15/2016 01:15 PM 6.6 (H) 4.8 - 5.6 % Final    Comment:    (NOTE)         Pre-diabetes: 5.7 - 6.4         Diabetes: >6.4         Glycemic control for adults with diabetes: <7.0     CBG:  Recent Labs Lab 05/20/16 1911 05/20/16 2319  05/21/16 0342 05/21/16 0812 05/21/16 1239  GLUCAP 156* 145* 109* 141* 108*    Recent Results (from the past 240 hour(s))  Blood Culture (routine x 2)     Status: Abnormal (Preliminary result)   Collection Time: 05/09/2016 11:00 AM  Result Value Ref Range Status   Specimen Description BLOOD LEFT HAND  Final   Special Requests BOTTLES DRAWN AEROBIC AND ANAEROBIC  5CC  Final   Culture  Setup Time   Final    YEAST AEROBIC BOTTLE ONLY CRITICAL RESULT CALLED TO, READ BACK BY AND VERIFIED WITH: Colin Rhein, PHARM AT [id number] ON [id number] BY Rhea Bleacher    Culture (A)  Final    CANDIDA GLABRATA WILL REFER TO [facility] FOR  ANTIFUNGAL SUSCEPTIBILITIES PER PHYSICIAN REQUEST    Report Status PENDING  Incomplete  Blood Culture (routine x 2)     Status: Abnormal   Collection Time: 04/24/2016 11:13 AM  Result Value Ref Range Status   Specimen Description BLOOD RIGHT ANTECUBITAL  Final   Special Requests BOTTLES DRAWN AEROBIC AND ANAEROBIC  5CC  Final   Culture  Setup Time (A)  Final    YEAST AEROBIC BOTTLE ONLY CRITICAL RESULT CALLED TO, READ BACK BY AND VERIFIED WITH: Colin Rhein, PHARM AT 1706 ON [id number] BY S. YARBROUGH    Culture CANDIDA GLABRATA (A)  Final   Report Status 05/19/2016 FINAL  Final  Blood Culture ID Panel (Reflexed)     Status: Abnormal   Collection Time: 05/03/2016 11:13 AM  Result Value Ref Range Status   Enterococcus species NOT DETECTED NOT DETECTED Final   Listeria monocytogenes NOT DETECTED NOT DETECTED Final   Staphylococcus species NOT DETECTED NOT DETECTED Final   Staphylococcus aureus NOT DETECTED NOT DETECTED Final   Streptococcus species NOT DETECTED NOT DETECTED Final   Streptococcus agalactiae NOT DETECTED NOT DETECTED Final   Streptococcus pneumoniae NOT DETECTED NOT DETECTED Final   Streptococcus pyogenes NOT DETECTED NOT DETECTED Final   Acinetobacter baumannii NOT DETECTED NOT DETECTED Final   Enterobacteriaceae species NOT DETECTED NOT DETECTED Final   Enterobacter  cloacae complex NOT DETECTED NOT DETECTED Final   Escherichia coli NOT DETECTED NOT DETECTED Final   Klebsiella oxytoca NOT DETECTED NOT DETECTED Final   Klebsiella pneumoniae NOT DETECTED NOT DETECTED Final   Proteus species NOT DETECTED NOT DETECTED Final   Serratia marcescens NOT DETECTED NOT DETECTED Final   Haemophilus influenzae NOT DETECTED NOT DETECTED Final   Neisseria meningitidis NOT DETECTED NOT DETECTED Final   Pseudomonas aeruginosa NOT DETECTED NOT DETECTED Final   Candida albicans NOT DETECTED NOT DETECTED Final   Candida glabrata DETECTED (A) NOT DETECTED Final    Comment: CRITICAL RESULT CALLED TO, READ BACK BY AND VERIFIED WITH: Michele Mcalpine AT 1706 ON [id number] BY S. YARBROUGH    Candida krusei NOT DETECTED NOT DETECTED Final   Candida parapsilosis NOT DETECTED NOT DETECTED Final   Candida tropicalis NOT DETECTED NOT DETECTED Final  Urine culture     Status: None   Collection Time: 05/01/2016  1:00 PM  Result Value Ref Range Status   Specimen Description URINE, CATHETERIZED  Final   Special Requests NONE  Final   Culture NO GROWTH  Final   Report Status 05/16/2016 FINAL  Final  MRSA PCR Screening     Status: None   Collection Time: 05/04/2016  6:54 PM  Result Value Ref Range Status   MRSA by PCR NEGATIVE NEGATIVE Final    Comment:        The GeneXpert MRSA Assay (FDA approved for NASAL specimens only), is one component of a comprehensive MRSA colonization surveillance program. It is not intended to diagnose MRSA infection nor to guide or monitor treatment for MRSA infections.   Culture, blood (routine x 2)     Status: None (Preliminary result)   Collection Time: 05/03/2016  6:19 PM  Result Value Ref Range Status   Specimen Description BLOOD LEFT HAND  Final   Special Requests IN PEDIATRIC BOTTLE 1.5CC  Final   Culture NO GROWTH 4 DAYS  Final   Report Status PENDING  Incomplete  Culture, blood (routine x 2)     Status: None (Preliminary result)   Collection  Time: 05/05/2016  6:36 PM  Result Value Ref Range Status   Specimen Description BLOOD RIGHT HAND  Final   Special Requests IN PEDIATRIC BOTTLE 0.5CC  Final   Culture NO GROWTH 4 DAYS  Final   Report Status PENDING  Incomplete     Scheduled Meds: . anidulafungin  100 mg Intravenous Q24H  . antiseptic oral rinse  7 mL Mouth Rinse q12n4p  . chlorhexidine  15 mL Mouth Rinse BID  . insulin aspart  0-15 Units Subcutaneous Q4H  . pantoprazole  40 mg Intravenous Q12H  . sodium chloride flush  3 mL Intravenous Q12H   Continuous Infusions: . Marland KitchenTPN (CLINIMIX-E) Adult 65 mL/hr at 05/20/16 1755   And  . fat emulsion 240 mL (05/20/16 1755)  . Marland KitchenTPN (CLINIMIX-E) Adult     And  . fat emulsion       LOS: 6 days   Cherene Altes, MD [facility] Office  [phone] Pager - Text Page per Amion as per below:  On-Call/Text Page:      [email or web address]      password [email or web address]  If 7PM-7AM, please contact night-coverage [email or web address] Password [facility] 05/21/2016, 3:37 PM

## 2016-05-21 NOTE — Progress Notes (Signed)
CSW will continue to follow for eventual return to [place], [place] Social Worker [phone]

## 2016-05-21 NOTE — Evaluation (Signed)
Occupational Therapy Evaluation Patient Details Name: [name] [name] MRN: [id number] DOB: Nov 22, 1934 Today's Date: 05/21/2016    History of Present Illness 81 y.o.femalewith history of degenerative disc disease, DM, diverticulosis, GERD, hiatal hernia, HLD, HTN, and chronic hyponatremia who was admitted 04/14/2016 >04/27/2016 for treatment of acute GI bleed with gastric outlet obstruction due to necrotic esophagitis with superficial fungal infection and ulceration with subsequent development of possible right-sided pneumonitis and aspiration pneumonia. She returned to the ED c/o shortness of breath, fevers, and confusion. +sepsis (?due to PICC)   Clinical Impression   Pt admitted with above. She demonstrates the below listed deficits and will benefit from continued OT to maximize safety and independence with BADLs.  Pt requires min - mod A for ADLs.  She fatigues quickly with activity.  Recommend SNF level rehab at discharge.       Follow Up Recommendations  SNF    Equipment Recommendations  None recommended by OT    Recommendations for Other Services       Precautions / Restrictions Precautions Precautions: Fall Precaution Comments: NG tube; central IJ for TPN; peripheral IV; O2      Mobility Bed Mobility Overal bed mobility: Needs Assistance Bed Mobility: Supine to Sit     Supine to sit: Mod assist     General bed mobility comments: Requires assist to move LEs off bed and assist to lift trunk, as well as assist to scoot to EOB.  Requires cues for sequencing   Transfers Overall transfer level: Needs assistance Equipment used: Rolling walker (2 wheeled);None Transfers: Sit to/from [employer] to Stand: Min assist Stand pivot transfers: Min assist       General transfer comment: Min A to move into standing and for balance     Balance Overall balance assessment: Needs assistance Sitting-balance support: Feet supported Sitting  balance-Leahy Scale: Fair     Standing balance support: Bilateral upper extremity supported Standing balance-Leahy Scale: Poor                              ADL Overall ADL's : Needs assistance/impaired Eating/Feeding: NPO   Grooming: Brushing hair;Minimal assistance;Sitting Grooming Details (indicate cue type and reason): Pt requires encouragement to attempt to do for herself  Upper Body Bathing: Moderate assistance;Sitting   Lower Body Bathing: Moderate assistance;Sit to/from stand   Upper Body Dressing : Moderate assistance;Sitting   Lower Body Dressing: Maximal assistance;Sit to/from stand   Toilet Transfer: Minimal assistance;Stand-pivot;BSC;RW   Toileting- Clothing Manipulation and Hygiene: Moderate assistance;Sit to/from stand       Functional mobility during ADLs: Minimal assistance;Rolling walker General ADL Comments: Pt fatigues quickly with activity      Vision     Perception     Praxis      Pertinent Vitals/Pain Pain Assessment: No/denies pain     Hand Dominance     Extremity/Trunk Assessment Upper Extremity Assessment Upper Extremity Assessment: Generalized weakness   Lower Extremity Assessment Lower Extremity Assessment: Generalized weakness;Defer to PT evaluation       Communication Communication Communication: No difficulties   Cognition Arousal/Alertness: Awake/alert Behavior During Therapy: WFL for tasks assessed/performed Overall Cognitive Status: No family/caregiver present to determine baseline cognitive functioning                     General Comments       Exercises       Shoulder Instructions  Home Living Family/patient expects to be discharged to:: Skilled nursing facility Living Arrangements: Alone                               Additional Comments: has been at SNF since 8/11 discharge      Prior Functioning/Environment Level of Independence: Independent with assistive device(s)         Comments: prior to July admission    OT Diagnosis: Generalized weakness   OT Problem List: Decreased strength;Decreased activity tolerance;Impaired balance (sitting and/or standing);Decreased safety awareness;Decreased knowledge of use of DME or AE   OT Treatment/Interventions: Self-care/ADL training;DME and/or AE instruction;Therapeutic activities;Patient/family education;Therapeutic exercise;Energy conservation    OT Goals(Current goals can be found in the care plan section) Acute Rehab OT Goals Patient Stated Goal: to get stronger  OT Goal Formulation: With patient Time For Goal Achievement: 06/04/16 Potential to Achieve Goals: Good ADL Goals Pt Will Perform Grooming: with min assist;standing Pt Will Perform Upper Body Bathing: with supervision;with set-up;sitting Pt Will Perform Lower Body Bathing: with min assist;sit to/from stand Pt Will Transfer to Toilet: with min guard assist Pt Will Perform Toileting - Clothing Manipulation and hygiene: with min assist  OT Frequency: Min 2X/week   Barriers to D/C: Decreased caregiver support          Co-evaluation              End of Session Equipment Utilized During Treatment: Rolling walker Nurse Communication: Mobility status  Activity Tolerance: Patient limited by fatigue Patient left: in chair;with call bell/phone within reach   Time: 1541-1606 OT Time Calculation (min): 25 min Charges:  OT General Charges $OT Visit: 1 Procedure OT Evaluation $OT Eval Moderate Complexity: 1 Procedure OT Treatments $Therapeutic Activity: 8-22 mins G-Codes:    Glee Lashomb M 06-14-16, 5:48 PM

## 2016-05-21 NOTE — Progress Notes (Signed)
PARENTERAL NUTRITION CONSULT NOTE - FOLLOW UP  Pharmacy Consult:  TPN Indication:  Gastric Outlet Obstruction  Allergies  Allergen Reactions  . Ace Inhibitors Cough  . Prednisone Other (See Comments)    LETHARGY  . Sitagliptin Other (See Comments)    Noted on MAR   Patient Measurements: Height: 5\' 2"  (157.5 cm) Weight: 193 lb 2 oz (87.6 kg) IBW/kg (Calculated) : 50.1 Adjusted Body Weight: 75 kg Usual Weight: 80 kg  Vital Signs: Temp: 98 F (36.7 C) (09/04 0400) Temp Source: Oral (09/04 0400) BP: 174/73 (09/04 0600) Pulse Rate: 88 (09/04 0600) Intake/Output from previous day: 09/03 0701 - 09/04 0700 In: 2686.3 [I.V.:1656.3; Blood:700; IV Piggyback:330] Out: 3200 [Urine:3200] Intake/Output from this shift: Total I/O In: -  Out: 350 [Urine:350]  Insulin Requirements in the past 24 hours:  13 units moderate SSI + 40 units regular insulin in TPN  Assessment: 89 YOF recently admitted to [facility] with gastric outlet obstruction due to necrotic esophagitis and discharged on home Unasyn and TPN to [facility].  Admitted to [facility] 05/13/2016 with shortness of breath, fever, and confusion.  PICC line was removed on admission due to concern for line infection- found to have fungemia.   She is to continue nutrition support with TPN.  GI: GOO on chronic TPN. Albumin up to 1.7. Prealbumin up at 8.1. Last BM was 8/31.  EGD yesterday showed severe reflux esophagitis, a gastric tumor and gastric outlet obstruction. Got biopsy to rule out malignancy. GI recommends IR to place post-pyloric TFs soon. Per surgery, likely to go to OR this week for bypass of ulcer.  Endo: hx DM, was requiring 35 units of regular insulin in TPN during last admission and at [facility]. CBGs are now controlled (110-160s) with SSI + 40 units of insulin in TPN Lytes: hyponatremia prior to TPN initiation. Na currently 134 -stable. K improved at 4.2. Mg is low today at 1.4- 2g ordered this AM. Phos remains wnl. CoCa 9.6.  Renal:  SCr stable, CrCL ~55-77ml/min. UOP not all charted Pulm: transient aspiration event - now on RA Cards: Hypertensive (174/73), HR 80s, NSR Hepatobil: LFTs normalized, TG WNL. Tbili 0.6. Neuro: some confusion noted ID: Blood cx detected candida glabrata. Started on anidulafungin for fungemia. Spoke with ID and since new PICC placed after positive blood cx they are ok with continuing TPN for now. If repeat cx remains positive may have to consider line holiday. So far it is ngtd x 3 days. Afebrile, WBC wnl Best Practices: Protonix IV BID, SCDs TPN Access: central line placed 05/16/16 TPN start date: chronic TPN patient  Current Nutrition:  NPO TPN   TPN at [facility]: (information obtained on 8/30 from [place] - pharmacist at [place]) Clinimix E 5/15 1440 ml + 20% lipids 192 ml 35 units of regular insulin ordered for each bag - per discussion there is question if this was actually being added to each bag at the facility  Nutritional Goals: per RD on 8/30 1500-1700 kCal 70-80 grams of protein per day  Plan:  Continue Clinimix E 5/15 at 34ml/hr Continue 20% lipid emulsion at 58ml/hr TPN provides 78 g of protein and 1560 kCals per day meeting 100% of patient needs Add MVI and TE in TPN Continue 40 units of regular insulin in TPN Continue moderate SSI and adjust as needed Monitor TPN labs, repeat blood cx's (See ID section) F/U trial of post-pyloric TFs  Sloan Leiter, PharmD, BCPS Clinical Pharmacist [phone] 05/21/2016 7:45 AM

## 2016-05-22 ENCOUNTER — Inpatient Hospital Stay (HOSPITAL_COMMUNITY): Payer: Medicare Other

## 2016-05-22 DIAGNOSIS — C169 Malignant neoplasm of stomach, unspecified: Secondary | ICD-10-CM | POA: Insufficient documentation

## 2016-05-22 DIAGNOSIS — K3189 Other diseases of stomach and duodenum: Secondary | ICD-10-CM | POA: Diagnosis present

## 2016-05-22 DIAGNOSIS — K635 Polyp of colon: Secondary | ICD-10-CM

## 2016-05-22 DIAGNOSIS — Z931 Gastrostomy status: Secondary | ICD-10-CM

## 2016-05-22 DIAGNOSIS — J690 Pneumonitis due to inhalation of food and vomit: Secondary | ICD-10-CM

## 2016-05-22 DIAGNOSIS — E119 Type 2 diabetes mellitus without complications: Secondary | ICD-10-CM

## 2016-05-22 LAB — CULTURE, BLOOD (ROUTINE X 2)
CULTURE: NO GROWTH
Culture: NO GROWTH

## 2016-05-22 LAB — GLUCOSE, CAPILLARY
GLUCOSE-CAPILLARY: 128 mg/dL — AB (ref 65–99)
GLUCOSE-CAPILLARY: 121 mg/dL — AB (ref 65–99)
GLUCOSE-CAPILLARY: 138 mg/dL — AB (ref 65–99)
Glucose-Capillary: 128 mg/dL — ABNORMAL HIGH (ref 65–99)
Glucose-Capillary: 156 mg/dL — ABNORMAL HIGH (ref 65–99)
Glucose-Capillary: 164 mg/dL — ABNORMAL HIGH (ref 65–99)

## 2016-05-22 LAB — MISC LABCORP TEST (SEND OUT): Labcorp test code: 182220

## 2016-05-22 LAB — PROTIME-INR
INR: 1.22
Prothrombin Time: 15.5 seconds — ABNORMAL HIGH (ref 11.4–15.2)

## 2016-05-22 LAB — CBC
HEMATOCRIT: 33.7 % — AB (ref 36.0–46.0)
Hemoglobin: 10.8 g/dL — ABNORMAL LOW (ref 12.0–15.0)
MCH: 26.4 pg (ref 26.0–34.0)
MCHC: 32 g/dL (ref 30.0–36.0)
MCV: 82.4 fL (ref 78.0–100.0)
Platelets: 196 10*3/uL (ref 150–400)
RBC: 4.09 MIL/uL (ref 3.87–5.11)
RDW: 15.4 % (ref 11.5–15.5)
WBC: 5.5 10*3/uL (ref 4.0–10.5)

## 2016-05-22 LAB — BASIC METABOLIC PANEL
ANION GAP: 8 (ref 5–15)
BUN: 16 mg/dL (ref 6–20)
CHLORIDE: 101 mmol/L (ref 101–111)
CO2: 24 mmol/L (ref 22–32)
Calcium: 8.1 mg/dL — ABNORMAL LOW (ref 8.9–10.3)
Creatinine, Ser: 0.68 mg/dL (ref 0.44–1.00)
Glucose, Bld: 113 mg/dL — ABNORMAL HIGH (ref 65–99)
POTASSIUM: 3.7 mmol/L (ref 3.5–5.1)
SODIUM: 133 mmol/L — AB (ref 135–145)

## 2016-05-22 LAB — MAGNESIUM: MAGNESIUM: 1.5 mg/dL — AB (ref 1.7–2.4)

## 2016-05-22 MED ORDER — DIATRIZOATE MEGLUMINE & SODIUM 66-10 % PO SOLN
30.0000 mL | Freq: Once | ORAL | Status: AC
  Administered 2016-05-22: 20 mL via ORAL

## 2016-05-22 MED ORDER — TRACE MINERALS CR-CU-MN-SE-ZN 10-1000-500-60 MCG/ML IV SOLN
INTRAVENOUS | Status: AC
  Administered 2016-05-22: 17:00:00 via INTRAVENOUS
  Filled 2016-05-22: qty 1560

## 2016-05-22 MED ORDER — VITAL HIGH PROTEIN PO LIQD: 1000.0000 mL | ORAL | Status: DC

## 2016-05-22 MED ORDER — LIDOCAINE VISCOUS 2 % MT SOLN
15.0000 mL | Freq: Once | OROMUCOSAL | Status: AC
  Administered 2016-05-22: 10 mL via OROMUCOSAL

## 2016-05-22 MED ORDER — DIATRIZOATE MEGLUMINE & SODIUM 66-10 % PO SOLN
ORAL | Status: AC
  Administered 2016-05-22: 20 mL via ORAL
  Filled 2016-05-22: qty 30

## 2016-05-22 MED ORDER — VITAL AF 1.2 CAL PO LIQD
1000.0000 mL | ORAL | Status: DC
  Administered 2016-05-22: 1000 mL
  Filled 2016-05-22 (×3): qty 1000

## 2016-05-22 MED ORDER — FAT EMULSION 20 % IV EMUL
240.0000 mL | INTRAVENOUS | Status: AC
  Administered 2016-05-22: 240 mL via INTRAVENOUS
  Filled 2016-05-22: qty 250

## 2016-05-22 MED ORDER — VITAL AF 1.2 CAL PO LIQD: 1000.0000 mL | ORAL | Status: DC

## 2016-05-22 MED ORDER — LIDOCAINE VISCOUS 2 % MT SOLN
OROMUCOSAL | Status: AC
  Administered 2016-05-22: 10 mL via OROMUCOSAL
  Filled 2016-05-22: qty 15

## 2016-05-22 MED ORDER — MAGNESIUM SULFATE 4 GM/100ML IV SOLN
4.0000 g | Freq: Once | INTRAVENOUS | Status: AC
  Administered 2016-05-22: 4 g via INTRAVENOUS
  Filled 2016-05-22: qty 100

## 2016-05-22 NOTE — Anesthesia Postprocedure Evaluation (Signed)
Anesthesia Post Note  Patient: [name] [name]  Procedure(s) Performed: Procedure(s) (LRB): ESOPHAGOGASTRODUODENOSCOPY (EGD) WITH PROPOFOL (N/A)  Patient location during evaluation: Endoscopy Anesthesia Type: MAC Level of consciousness: awake and awake and alert Pain management: pain level controlled Vital Signs Assessment: post-procedure vital signs reviewed and stable Respiratory status: spontaneous breathing, nonlabored ventilation and respiratory function stable Cardiovascular status: blood pressure returned to baseline Anesthetic complications: no    Last Vitals:  Vitals:   05/22/16 0526 05/22/16 1124  BP: (!) 172/70 (!) 171/70  Pulse:    Resp:    Temp:      Last Pain:  Vitals:   05/22/16 1100  TempSrc:   PainSc: 0-No pain                 Kymia Simi COKER

## 2016-05-22 NOTE — Progress Notes (Signed)
INFECTIOUS DISEASE PROGRESS NOTE  ID: ILONA PEPPERS is a 81 y.o. female with  Principal Problem:   Fungemia Active Problems:   DM2 (diabetes mellitus, type 2) ([place])   Gastric outlet obstruction   Esophageal necrosis   Sepsis ([place])   Acute respiratory failure (HCC)   Acute encephalopathy   On total parenteral nutrition (TPN)   Normocytic anemia   Essential hypertension   Erosive esophagitis   Candida glabrata infection  Subjective: Without complaints  Abtx:  Anti-infectives    Start     Dose/Rate Route Frequency Ordered Stop   05/18/16 1700  anidulafungin ([name]) 100 mg in sodium chloride 0.9 % 100 mL IVPB     100 mg over 90 Minutes Intravenous Every 24 hours 05/04/2016 1627     04/29/2016 1700  anidulafungin ([name]) 200 mg in sodium chloride 0.9 % 200 mL IVPB     200 mg over 180 Minutes Intravenous  Once 05/05/2016 1627 05/01/2016 2003   05/16/16 0200  vancomycin (VANCOCIN) IVPB 750 mg/150 ml premix  Status:  Discontinued     750 mg 150 mL/hr over 60 Minutes Intravenous Every 12 hours 05/12/2016 1409 05/16/16 1045   04/23/2016 1400  fluconazole (DIFLUCAN) IVPB 100 mg  Status:  Discontinued     100 mg 50 mL/hr over 60 Minutes Intravenous Every 24 hours 05/10/2016 1317 05/01/2016 1653   04/18/2016 1400  vancomycin (VANCOCIN) IVPB 1000 mg/200 mL premix  Status:  Discontinued     1,000 mg 200 mL/hr over 60 Minutes Intravenous  Once 04/29/2016 1353 05/17/2016 1407   05/14/2016 1330  vancomycin (VANCOCIN) IVPB 1000 mg/200 mL premix     1,000 mg 200 mL/hr over 60 Minutes Intravenous  Once 04/28/2016 1325 05/10/2016 1525   05/11/2016 1300  ceFEPIme (MAXIPIME) 2 g in dextrose 5 % 50 mL IVPB  Status:  Discontinued     2 g 100 mL/hr over 30 Minutes Intravenous Every 12 hours 05/01/2016 1257 05/16/16 1043      Medications:  Scheduled: . anidulafungin  100 mg Intravenous Q24H  . antiseptic oral rinse  7 mL Mouth Rinse q12n4p  . chlorhexidine  15 mL Mouth Rinse BID  . feeding supplement (VITAL AF  1.2 CAL)  1,000 mL Per Tube Q24H  . hydrALAZINE  5 mg Intravenous Q6H  . insulin aspart  0-15 Units Subcutaneous Q4H  . pantoprazole  40 mg Intravenous Q12H  . sodium chloride flush  3 mL Intravenous Q12H    Objective: Vital signs in last 24 hours: Temp:  [98.5 F (36.9 C)-98.9 F (37.2 C)] 98.7 F (37.1 C) (09/05 0337) Pulse Rate:  [98-111] 106 (09/05 0337) Resp:  [19-22] 19 (09/05 0337) BP: (158-177)/(57-75) 171/70 (09/05 1124) SpO2:  [91 %-95 %] 94 % (09/05 0337)   General appearance: alert, cooperative and no distress Resp: clear to auscultation bilaterally Cardio: regular rate and rhythm GI: normal findings: bowel sounds normal and soft, non-tender  Lab Results  Recent Labs  05/21/16 0240 05/22/16 0500  WBC 4.7 5.5  HGB 10.5* 10.8*  HCT 32.7* 33.7*  NA 134* 133*  K 4.2 3.7  CL 105 101  CO2 25 24  BUN 14 16  CREATININE 0.68 0.68   Liver Panel  Recent Labs  05/21/16 0240  PROT 4.6*  ALBUMIN 1.7*  AST 27  ALT 22  ALKPHOS 74  BILITOT 0.6   Sedimentation Rate No results for input(s): ESRSEDRATE in the last 72 hours. C-Reactive Protein No results for input(s): CRP in  the last 72 hours.  Microbiology: Recent Results (from the past 240 hour(s))  Blood Culture (routine x 2)     Status: Abnormal (Preliminary result)   Collection Time: 05/14/2016 11:00 AM  Result Value Ref Range Status   Specimen Description BLOOD LEFT HAND  Final   Special Requests BOTTLES DRAWN AEROBIC AND ANAEROBIC  5CC  Final   Culture  Setup Time   Final    YEAST AEROBIC BOTTLE ONLY CRITICAL RESULT CALLED TO, READ BACK BY AND VERIFIED WITH: Colin Rhein, PHARM AT 1812 ON [id number] BY S. YARBROUGH    Culture (A)  Final    CANDIDA GLABRATA WILL REFER TO [facility] FOR ANTIFUNGAL SUSCEPTIBILITIES PER PHYSICIAN REQUEST    Report Status PENDING  Incomplete  Blood Culture (routine x 2)     Status: Abnormal   Collection Time: 05/07/2016 11:13 AM  Result Value Ref Range Status   Specimen  Description BLOOD RIGHT ANTECUBITAL  Final   Special Requests BOTTLES DRAWN AEROBIC AND ANAEROBIC  5CC  Final   Culture  Setup Time (A)  Final    YEAST AEROBIC BOTTLE ONLY CRITICAL RESULT CALLED TO, READ BACK BY AND VERIFIED WITH: Colin Rhein, PHARM AT 1706 ON [id number] BY S. YARBROUGH    Culture CANDIDA GLABRATA (A)  Final   Report Status 05/19/2016 FINAL  Final  Blood Culture ID Panel (Reflexed)     Status: Abnormal   Collection Time: 04/23/2016 11:13 AM  Result Value Ref Range Status   Enterococcus species NOT DETECTED NOT DETECTED Final   Listeria monocytogenes NOT DETECTED NOT DETECTED Final   Staphylococcus species NOT DETECTED NOT DETECTED Final   Staphylococcus aureus NOT DETECTED NOT DETECTED Final   Streptococcus species NOT DETECTED NOT DETECTED Final   Streptococcus agalactiae NOT DETECTED NOT DETECTED Final   Streptococcus pneumoniae NOT DETECTED NOT DETECTED Final   Streptococcus pyogenes NOT DETECTED NOT DETECTED Final   Acinetobacter baumannii NOT DETECTED NOT DETECTED Final   Enterobacteriaceae species NOT DETECTED NOT DETECTED Final   Enterobacter cloacae complex NOT DETECTED NOT DETECTED Final   Escherichia coli NOT DETECTED NOT DETECTED Final   Klebsiella oxytoca NOT DETECTED NOT DETECTED Final   Klebsiella pneumoniae NOT DETECTED NOT DETECTED Final   Proteus species NOT DETECTED NOT DETECTED Final   Serratia marcescens NOT DETECTED NOT DETECTED Final   Haemophilus influenzae NOT DETECTED NOT DETECTED Final   Neisseria meningitidis NOT DETECTED NOT DETECTED Final   Pseudomonas aeruginosa NOT DETECTED NOT DETECTED Final   Candida albicans NOT DETECTED NOT DETECTED Final   Candida glabrata DETECTED (A) NOT DETECTED Final    Comment: CRITICAL RESULT CALLED TO, READ BACK BY AND VERIFIED WITH: Michele Mcalpine AT 1706 ON [id number] BY S. YARBROUGH    Candida krusei NOT DETECTED NOT DETECTED Final   Candida parapsilosis NOT DETECTED NOT DETECTED Final   Candida tropicalis NOT  DETECTED NOT DETECTED Final  Urine culture     Status: None   Collection Time: 04/27/2016  1:00 PM  Result Value Ref Range Status   Specimen Description URINE, CATHETERIZED  Final   Special Requests NONE  Final   Culture NO GROWTH  Final   Report Status 05/16/2016 FINAL  Final  MRSA PCR Screening     Status: None   Collection Time: 05/03/2016  6:54 PM  Result Value Ref Range Status   MRSA by PCR NEGATIVE NEGATIVE Final    Comment:        The GeneXpert MRSA Assay (FDA approved for NASAL  specimens only), is one component of a comprehensive MRSA colonization surveillance program. It is not intended to diagnose MRSA infection nor to guide or monitor treatment for MRSA infections.   Culture, blood (routine x 2)     Status: None (Preliminary result)   Collection Time: 04/21/2016  6:19 PM  Result Value Ref Range Status   Specimen Description BLOOD LEFT HAND  Final   Special Requests IN PEDIATRIC BOTTLE 1.5CC  Final   Culture NO GROWTH 4 DAYS  Final   Report Status PENDING  Incomplete  Culture, blood (routine x 2)     Status: None (Preliminary result)   Collection Time: 05/16/2016  6:36 PM  Result Value Ref Range Status   Specimen Description BLOOD RIGHT HAND  Final   Special Requests IN PEDIATRIC BOTTLE 0.5CC  Final   Culture NO GROWTH 4 DAYS  Final   Report Status PENDING  Incomplete    Studies/Results: Dg Naso G Tube Plc W/fl W/rad  Result Date: 05/22/2016 CLINICAL DATA:  Replacement of NG tube and placement of a feeding tube. EXAM: NASO G TUBE PLACEMENT WITH FL AND WITH RAD CONTRAST:  20 cc Gastrografin FLUOROSCOPY TIME:  Fluoroscopy Time:  20 minutes Radiation Exposure Index (if provided by the fluoroscopic device): 5341.6 mGy Number of Acquired Spot Images: 0 COMPARISON:  CT scan 04/17/2016 FINDINGS: The radiology technologist Baxter Flattery Dingus). Remove the NG tube which was coronal back on itself in the esophagus and replace and NG tube into the stomach. She also advanced a feeding tube  into the stomach but could not advance it into the duodenum. I could not advance it into the duodenum either. IMPRESSION: NG tube and feeding tubes in the stomach. The feeding tube could not be advanced into the duodenum. Electronically Signed   By: Marijo Sanes M.D.   On: 05/22/2016 11:39     Assessment/Plan: C glabrata fungemia  Aspiration PNA  Gastric mass- hyperplastic polyp on Bx 8-6  DM2  G-tube placed today.  No change in eraxis (sensi pending) Plan for 14 days.  Repeat BCx 8-31 ngtd  Total days of antibiotics: 7 eraxis         Bobby Rumpf Infectious Diseases (pager) [phone] [email or web address] 05/22/2016, 2:23 PM  LOS: 7 days

## 2016-05-22 NOTE — Progress Notes (Signed)
PROGRESS NOTE    [name] [name]  [id number] DOB: 20-Apr-1935 DOA: 05/03/2016 PCP: [name], MD   Brief Narrative:  81 y.o.WF PMHx degenerative disc disease, DM Type 2 controlled with complications, Diverticulosis, GERD, Hiatal Hernia, HLD, HTN, and Chronic Hyponatremia   who was admitted 04/14/2016 > 04/27/2016 for treatment of acute GI bleed with gastric outlet obstruction due to necrotic esophagitis with superficial fungal infection and ulceration with subsequent development of possible right-sided pneumonitis and aspiration pneumonia. Patient was discharged on Unasyn and Diflucan and completed her course on 05/14/2016.  A Left arm PICC was placed for home Unasyn and TPN. She returned to the ED c/o shortness of breath and fevers. In the ED she was noted to be confused.     Subjective: 9/5 A/O  4 (sleepy having just returned from placement of postpyloric NG tube). Follows commands. NAD   Assessment & Plan:   Principal Problem:   Fungemia Active Problems:   DM2 (diabetes mellitus, type 2) ([place])   Gastric outlet obstruction   Esophageal necrosis   Sepsis ([place])   Acute respiratory failure ([place])   Acute encephalopathy   On total parenteral nutrition (TPN)   Normocytic anemia   Essential hypertension   Erosive esophagitis   Candida glabrata infection   Gastric mass   Gastric adenocarcinoma (HCC)   Sepsis /Fungemia (positive Candida glabrata) -febrile at nursing home to 104.5  - UA unremarkable  - chest x-ray improved compared to recent  -Continue per ID Anidulafungin -Stop TPN now that postpyloric NG tube placed: Speak with ID in [place].?  Acute hypoxic respiratory failure / AMS -Resolved  Acute GI bleed / esophageal necrosis / gastric outlet obstruction with bowel ulceration -EGD: Gastric tumor, esophagitis E results below -8/31 biopsy: See results below   Gastric tumor Stomach/pyloric channel mass- SUSPICIOUS FOR ADENOCARCINOMA -8/31 biopsy tumor  pending -CCS recommendations: Stated most likely will not perform resection until next week after nutritional status improved. -After speaking with [facility] today another EGD would not change plan. Patient needs resection of affected area.  Chronic Hyponatremia -Appears stable   Recent Labs Lab 05/18/16 0316 05/19/16 0354 05/20/16 0545 05/21/16 0240 05/22/16 0500  NA 133* 134* 136 134* 133*   Hypokalemia -Resolved  Hypomagnesemia -Magnesium IV 4 gm  Normocytic anemia(Baseline Hg ~9.0)  Recent Labs Lab 05/16/16 0721 05/10/2016 0745 05/20/16 0545 05/21/16 0240 05/22/16 0500  HGB 8.4* 7.1* 6.2* 10.5* 10.8*   HTN -Improved -Hydralazine 5 mg QID -Hydralazine PRN -Echocardiogram: Essentially normal see results below   DM type 2 controlled without complication -[id number] Hemoglobin A1c = 6.6 -Moderate SSI     DVT prophylaxis: SCD Code Status: DO NOT RESUSCITATE Family Communication: None Disposition Plan: Await [facility] recommendation   Consultants:  Dr.Kavitha V [facility] GI Dr. Ralene Ok [facility]    Procedures/Significant Events:  8/31 EGD:- Severe reflux esophagitis. Biopsied.- Gastric tumor at the pylorus and in the prepyloric region of the stomach.  Biopsied.- Gastric outlet obstruction 8/31 Biopsy Stomach/pyloric channel mass- SUSPICIOUS FOR ADENOCARCINOMA. - Esophagus, biopsy:- EXUDATE WITH FOCAL NECROSIS C/W ULCER. Negative malignancy 9/1 Echocardiogram:- Left ventricle: mild focal basal hypertrophy of the septum. -LVEF =60% to 65%. 9/5 placement postpyloric NG tube                         Cultures 8/29 blood left hand /right AC positive Candida glabrata 8/29 urine negative 8/29 MRSA by PCR negative 8/31 blood left/right hand negative   Antimicrobials: Anidulafungin 8/31>>  Devices    LINES / TUBES:      Continuous Infusions: . Marland KitchenTPN (CLINIMIX-E) Adult 65 mL/hr at 05/22/16 1714   And  . fat emulsion 240 mL (05/22/16 1714)      Objective: Vitals:   05/22/16 0337 05/22/16 0526 05/22/16 1124 05/22/16 1700  BP: (!) 162/71 (!) 172/70 (!) 171/70 (!) 147/66  Pulse: (!) 106   94  Resp: 19   19  Temp: 98.7 F (37.1 C)     TempSrc: Axillary     SpO2: 94%   94%  Weight:      Height:        Intake/Output Summary (Last 24 hours) at 05/22/16 1837 Last data filed at 05/22/16 1600  Gross per 24 hour  Intake             1530 ml  Output             1350 ml  Net              180 ml   Filed Weights   05/02/2016 1113 05/03/2016 1944 05/19/16 0319  Weight: 77.2 kg (170 lb 3.2 oz) 82.7 kg (182 lb 5.1 oz) 87.6 kg (193 lb 2 oz)    Examination:  General: A/O 2 (does not know when, why), No acute respiratory distress Eyes: negative scleral hemorrhage, negative anisocoria, negative icterus ENT: Negative Runny nose, negative gingival bleeding, Neck:  Negative scars, masses, torticollis, lymphadenopathy, JVD Lungs: Clear to auscultation bilaterally without wheezes or crackles Cardiovascular: Regular rate and rhythm without murmur gallop or rub normal S1 and S2 Abdomen: negative abdominal pain, nondistended, positive soft, bowel sounds, no rebound, no ascites, no appreciable mass Extremities: No significant cyanosis, clubbing, or edema bilateral lower extremities Skin: Negative rashes, lesions, ulcers Psychiatric:  Negative depression, negative anxiety, negative fatigue, negative mania  Central nervous system:  Cranial nerves II through XII intact, tongue/uvula midline, all extremities muscle strength 5/5, sensation intact throughout,negative dysarthria, negative expressive aphasia, negative receptive aphasia.  .     Data Reviewed: Care during the described time interval was provided by me .  I have reviewed this patient's available data, including medical history, events of note, physical examination, and all test results as part of my evaluation. I have personally reviewed and interpreted all radiology  studies.  CBC:  Recent Labs Lab 05/16/16 0721 05/03/2016 0745 05/20/16 0545 05/21/16 0240 05/22/16 0500  WBC 7.2 4.3 3.7* 4.7 5.5  NEUTROABS 6.1  --   --  2.6  --   HGB 8.4* 7.1* 6.2* 10.5* 10.8*  HCT 26.9* 22.5* 20.2* 32.7* 33.7*  MCV 84.9 85.9 86.0 83.2 82.4  PLT 142* 124* 140* 182 [id number]   Basic Metabolic Panel:  Recent Labs Lab 05/12/2016 1933 05/16/16 0721 04/19/2016 0617 05/18/16 0316 05/19/16 0354 05/20/16 0545 05/21/16 0240 05/22/16 0500  NA 132* 133* 133* 133* 134* 136 134* 133*  K 2.6* 3.6 3.2* 3.5 3.9 3.4* 4.2 3.7  CL 95* 98* 100* 101 105 111 105 101  CO2 30 28 28 25 25 22 25 24   GLUCOSE 170* 102* 152* 154* 141* 143* 109* 113*  BUN 23* 24* 20 20 17 13 14 16   CREATININE 0.70 0.76 0.74 0.69 0.66 0.51 0.68 0.68  CALCIUM 7.2* 7.3* 6.9* 7.2* 7.5* 6.6* 7.8* 8.1*  MG 2.0 1.8 1.8 2.1  --   --  1.4* 1.5*  PHOS 2.6 2.6 2.5 2.5  --   --  3.3  --    GFR: Estimated Creatinine  Clearance: 59.6 mL/min (by C-G formula based on SCr of 0.8 mg/dL). Liver Function Tests:  Recent Labs Lab 05/16/16 0721 05/12/2016 0617 05/21/16 0240  AST 67* 41 27  ALT 42 30 22  ALKPHOS 74 66 74  BILITOT 0.5 0.2* 0.6  PROT 4.5* 3.9* 4.6*  ALBUMIN 1.6* 1.3* 1.7*   No results for input(s): LIPASE, AMYLASE in the last 168 hours. No results for input(s): AMMONIA in the last 168 hours. Coagulation Profile:  Recent Labs Lab 05/22/16 0500  INR 1.22   Cardiac Enzymes:  Recent Labs Lab 05/09/2016 1933  TROPONINI 0.06*   BNP (last 3 results) No results for input(s): PROBNP in the last 8760 hours. HbA1C: No results for input(s): HGBA1C in the last 72 hours. CBG:  Recent Labs Lab 05/21/16 2359 05/22/16 0430 05/22/16 0803 05/22/16 1303 05/22/16 1626  GLUCAP 110* 128* 121* 156* 128*   Lipid Profile:  Recent Labs  05/21/16 0240  TRIG 69   Thyroid Function Tests: No results for input(s): TSH, T4TOTAL, FREET4, T3FREE, THYROIDAB in the last 72 hours. Anemia Panel: No results for  input(s): VITAMINB12, FOLATE, FERRITIN, TIBC, IRON, RETICCTPCT in the last 72 hours. Urine analysis:    Component Value Date/Time   COLORURINE AMBER (A) 04/30/2016 1300   APPEARANCEUR CLEAR 04/28/2016 1300   LABSPEC 1.029 05/09/2016 1300   PHURINE 6.5 05/16/2016 1300   GLUCOSEU 100 (A) 05/02/2016 1300   HGBUR SMALL (A) 04/24/2016 1300   BILIRUBINUR SMALL (A) 04/23/2016 1300   KETONESUR NEGATIVE 05/09/2016 1300   PROTEINUR 100 (A) 04/19/2016 1300   NITRITE NEGATIVE 05/07/2016 1300   LEUKOCYTESUR NEGATIVE 04/24/2016 1300   Sepsis Labs: @LABRCNTIP (procalcitonin:4,lacticidven:4)  ) Recent Results (from the past 240 hour(s))  Blood Culture (routine x 2)     Status: Abnormal (Preliminary result)   Collection Time: 04/17/2016 11:00 AM  Result Value Ref Range Status   Specimen Description BLOOD LEFT HAND  Final   Special Requests BOTTLES DRAWN AEROBIC AND ANAEROBIC  5CC  Final   Culture  Setup Time   Final    YEAST AEROBIC BOTTLE ONLY CRITICAL RESULT CALLED TO, READ BACK BY AND VERIFIED WITH: Colin Rhein, PHARM AT 1812 ON [id number] BY S. YARBROUGH    Culture (A)  Final    CANDIDA GLABRATA WILL REFER TO [facility] FOR ANTIFUNGAL SUSCEPTIBILITIES PER PHYSICIAN REQUEST    Report Status PENDING  Incomplete  Blood Culture (routine x 2)     Status: Abnormal   Collection Time: 05/04/2016 11:13 AM  Result Value Ref Range Status   Specimen Description BLOOD RIGHT ANTECUBITAL  Final   Special Requests BOTTLES DRAWN AEROBIC AND ANAEROBIC  5CC  Final   Culture  Setup Time (A)  Final    YEAST AEROBIC BOTTLE ONLY CRITICAL RESULT CALLED TO, READ BACK BY AND VERIFIED WITH: Colin Rhein, PHARM AT 1706 ON [id number] BY S. YARBROUGH    Culture CANDIDA GLABRATA (A)  Final   Report Status 05/19/2016 FINAL  Final  Blood Culture ID Panel (Reflexed)     Status: Abnormal   Collection Time: 04/28/2016 11:13 AM  Result Value Ref Range Status   Enterococcus species NOT DETECTED NOT DETECTED Final   Listeria monocytogenes NOT  DETECTED NOT DETECTED Final   Staphylococcus species NOT DETECTED NOT DETECTED Final   Staphylococcus aureus NOT DETECTED NOT DETECTED Final   Streptococcus species NOT DETECTED NOT DETECTED Final   Streptococcus agalactiae NOT DETECTED NOT DETECTED Final   Streptococcus pneumoniae NOT DETECTED NOT DETECTED Final  Streptococcus pyogenes NOT DETECTED NOT DETECTED Final   Acinetobacter baumannii NOT DETECTED NOT DETECTED Final   Enterobacteriaceae species NOT DETECTED NOT DETECTED Final   Enterobacter cloacae complex NOT DETECTED NOT DETECTED Final   Escherichia coli NOT DETECTED NOT DETECTED Final   Klebsiella oxytoca NOT DETECTED NOT DETECTED Final   Klebsiella pneumoniae NOT DETECTED NOT DETECTED Final   Proteus species NOT DETECTED NOT DETECTED Final   Serratia marcescens NOT DETECTED NOT DETECTED Final   Haemophilus influenzae NOT DETECTED NOT DETECTED Final   Neisseria meningitidis NOT DETECTED NOT DETECTED Final   Pseudomonas aeruginosa NOT DETECTED NOT DETECTED Final   Candida albicans NOT DETECTED NOT DETECTED Final   Candida glabrata DETECTED (A) NOT DETECTED Final    Comment: CRITICAL RESULT CALLED TO, READ BACK BY AND VERIFIED WITH: Michele Mcalpine AT 1706 ON [id number] BY S. YARBROUGH    Candida krusei NOT DETECTED NOT DETECTED Final   Candida parapsilosis NOT DETECTED NOT DETECTED Final   Candida tropicalis NOT DETECTED NOT DETECTED Final  Urine culture     Status: None   Collection Time: 05/14/2016  1:00 PM  Result Value Ref Range Status   Specimen Description URINE, CATHETERIZED  Final   Special Requests NONE  Final   Culture NO GROWTH  Final   Report Status 05/16/2016 FINAL  Final  MRSA PCR Screening     Status: None   Collection Time: 04/27/2016  6:54 PM  Result Value Ref Range Status   MRSA by PCR NEGATIVE NEGATIVE Final    Comment:        The GeneXpert MRSA Assay (FDA approved for NASAL specimens only), is one component of a comprehensive MRSA  colonization surveillance program. It is not intended to diagnose MRSA infection nor to guide or monitor treatment for MRSA infections.   Culture, blood (routine x 2)     Status: None   Collection Time: 05/02/2016  6:19 PM  Result Value Ref Range Status   Specimen Description BLOOD LEFT HAND  Final   Special Requests IN PEDIATRIC BOTTLE 1.5CC  Final   Culture NO GROWTH 5 DAYS  Final   Report Status 05/22/2016 FINAL  Final  Culture, blood (routine x 2)     Status: None   Collection Time: 04/29/2016  6:36 PM  Result Value Ref Range Status   Specimen Description BLOOD RIGHT HAND  Final   Special Requests IN PEDIATRIC BOTTLE 0.5CC  Final   Culture NO GROWTH 5 DAYS  Final   Report Status 05/22/2016 FINAL  Final         Radiology Studies: Dg Naso G Tube Plc W/fl W/rad  Result Date: 05/22/2016 CLINICAL DATA:  Replacement of NG tube and placement of a feeding tube. EXAM: NASO G TUBE PLACEMENT WITH FL AND WITH RAD CONTRAST:  20 cc Gastrografin FLUOROSCOPY TIME:  Fluoroscopy Time:  20 minutes Radiation Exposure Index (if provided by the fluoroscopic device): 5341.6 mGy Number of Acquired Spot Images: 0 COMPARISON:  CT scan 04/17/2016 FINDINGS: The radiology technologist Baxter Flattery Dingus). Remove the NG tube which was coronal back on itself in the esophagus and replace and NG tube into the stomach. She also advanced a feeding tube into the stomach but could not advance it into the duodenum. I could not advance it into the duodenum either. IMPRESSION: NG tube and feeding tubes in the stomach. The feeding tube could not be advanced into the duodenum. Electronically Signed   By: Marijo Sanes M.D.   On: 05/22/2016 11:39  Scheduled Meds: . anidulafungin  100 mg Intravenous Q24H  . antiseptic oral rinse  7 mL Mouth Rinse q12n4p  . chlorhexidine  15 mL Mouth Rinse BID  . feeding supplement (VITAL AF 1.2 CAL)  1,000 mL Per Tube Q24H  . hydrALAZINE  5 mg Intravenous Q6H  . insulin aspart  0-15  Units Subcutaneous Q4H  . pantoprazole  40 mg Intravenous Q12H  . sodium chloride flush  3 mL Intravenous Q12H   Continuous Infusions: . Marland KitchenTPN (CLINIMIX-E) Adult 65 mL/hr at 05/22/16 1714   And  . fat emulsion 240 mL (05/22/16 1714)     LOS: 7 days    Time spent: 40 minutes    Berton Butrick, Geraldo Docker, MD [facility] Pager [phone]   If 7PM-7AM, please contact night-coverage [email or web address] Password [facility] 05/22/2016, 6:37 PM

## 2016-05-22 NOTE — Progress Notes (Signed)
Physical Therapy Treatment Patient Details Name: [name] [name] MRN: [id number] DOB: [birth year] Today's Date: 05/22/2016    History of Present Illness 81 y.o.femalewith history of degenerative disc disease, DM, diverticulosis, GERD, hiatal hernia, HLD, HTN, and chronic hyponatremia who was admitted 04/14/2016 >04/27/2016 for treatment of acute GI bleed with gastric outlet obstruction due to necrotic esophagitis with superficial fungal infection and ulceration with subsequent development of possible right-sided pneumonitis and aspiration pneumonia. She returned to the ED c/o shortness of breath, fevers, and confusion. +sepsis (?due to PICC)    PT Comments    Patient progressing slowly due to medical complications and sedation from procedure this morning.  Feel she will continue to benefit from skilled PT in the acute setting prior to d/c to SNF level rehab.   Follow Up Recommendations  SNF     Equipment Recommendations  None recommended by PT    Recommendations for Other Services       Precautions / Restrictions Precautions Precaution Comments: NG tube; central IJ for TPN; peripheral IV; O2, panda    Mobility  Bed Mobility Overal bed mobility: Needs Assistance   Rolling: Min assist Sidelying to sit: Mod assist;HOB elevated       General bed mobility comments: cues to push up with one hand and assist to lift trunk, move feet off bed  Transfers Overall transfer level: Needs assistance Equipment used: Rolling walker (2 wheeled) Transfers: Sit to/from Stand Sit to Stand: Mod assist Stand pivot transfers: Mod assist       General transfer comment: lifting assist, cues for safety stand to sit, stand pivot to [facility], then to chair min/mod A for balance, safety, cues for hand placement  Ambulation/Gait                 Stairs            Wheelchair Mobility    Modified Rankin (Stroke Patients Only)       Balance   Sitting-balance support: Feet  supported Sitting balance-Leahy Scale: Fair     Standing balance support: Bilateral upper extremity supported Standing balance-Leahy Scale: Poor Standing balance comment: standing from [facility] for pericare, increased time for reaching up to walker from armrests                    Cognition Arousal/Alertness: Awake/alert Behavior During Therapy: WFL for tasks assessed/performed Overall Cognitive Status: No family/caregiver present to determine baseline cognitive functioning                      Exercises      General Comments General comments (skin integrity, edema, etc.): HR 122 with OOB       Pertinent Vitals/Pain Pain Assessment: No/denies pain    Home Living                      Prior Function            PT Goals (current goals can now be found in the care plan section) Progress towards PT goals: Progressing toward goals (slow, limited today due to sedation from procedure)    Frequency  Min 2X/week    PT Plan Current plan remains appropriate    Co-evaluation             End of Session Equipment Utilized During Treatment: Gait belt;Oxygen Activity Tolerance: Patient limited by fatigue;Treatment limited secondary to medical complications (Comment) (elevated HR) Patient left: in chair;with call bell/phone within reach;with  family/visitor present     Time: [id number] PT Time Calculation (min) (ACUTE ONLY): 15 min  Charges:  $Therapeutic Activity: 8-22 mins                    G Codes:      Reginia Naas 2016-06-11, 1:32 PM  Magda Kiel, [place] 06/11/2016

## 2016-05-22 NOTE — Progress Notes (Addendum)
Nutrition Consult/Follow Up  DOCUMENTATION CODES:   Obesity unspecified  INTERVENTION:    Initiate Vital AF 1.2 formula at 20 ml/hr   TPN per pharmacy  NUTRITION DIAGNOSIS:   Inadequate oral intake related to inability to eat as evidenced by NPO status, ongoing  GOAL:   Patient will meet greater than or equal to 90% of their needs, met  MONITOR:   TF tolerance, Diet advancement, Labs, Weight trends, I & O's  ASSESSMENT:    81 y.o. Female wiht PMH of degenerative disc disease, diabetes, diverticulosis, GERD, hiatal hernia, HLD, HTN, chronic hyponatremia recently admitted from 04/14/2016 until 04/27/2016 for treatment of acute GI bleed with gastric outlet obstruction, necrotic esophagitis with superficial fungal infection and ulceration with subsequent development of possible right-sided pneumonitis and aspiration pneumonia. Patient was discharged on Unasyn and completed her course on 05/14/2016.[name] Patient with left arm PICC in place since time of discharge for Unasyn and TPN administration. Patient's only complaint at this point time is shortness of breath and fevers. Denies worsening lower extremity edema, chest pain, palpitations, dysuria. Patient unable to provide further history.  Pt in [place] upon visit. NGT replaced >> to LIS. Small bore feeding tube in place (L nare) >> tip in stomach; could not be advanced into duodenum.  Spoke with Dr. Sherral Hammers >> plan is to trial trickle tube feeding. RD initiated Vital AF 1.2 formula at 20 ml/hr as well as Adult Tube Feeding Protocol.  Patient continues to receives TPN with Clinimix E 5/15 @ 65 ml/hr and lipids @ 10 ml/hr.  Provides 1587 kcal and 78 grams protein per day.  Meets 100% minimum estimated energy needs and 100% minimum estimated protein needs.  Diet Order:  Diet NPO time specified ADULT TPN TPN (CLINIMIX-E) Adult TPN (CLINIMIX-E) Adult  Skin:  Reviewed, no issues  Last BM:  9/3  Height:   Ht Readings  from Last 1 Encounters:  05/10/2016 '5\' 2"'$  (1.575 m)    Weight:   Wt Readings from Last 1 Encounters:  05/19/16 193 lb 2 oz (87.6 kg)    Ideal Body Weight:  50 kg  BMI:  Body mass index is 35.32 kg/m.  Estimated Nutritional Needs:   Kcal:  1500-1700  Protein:  70-80 gm  Fluid:  1.5-1.7 L  EDUCATION NEEDS:   No education needs identified at this time  Arthur Holms, RD, LDN Pager #: [phone] After-Hours Pager #: [phone]

## 2016-05-22 NOTE — Progress Notes (Signed)
PARENTERAL NUTRITION CONSULT NOTE - FOLLOW UP  Pharmacy Consult:  TPN Indication:  Gastric Outlet Obstruction  Allergies  Allergen Reactions  . Ace Inhibitors Cough  . Prednisone Other (See Comments)    LETHARGY  . Sitagliptin Other (See Comments)    Noted on [facility]   Patient Measurements: Height: 5\' 2"  (157.5 cm) Weight: 193 lb 2 oz (87.6 kg) IBW/kg (Calculated) : 50.1 Adjusted Body Weight: 75 kg Usual Weight: 80 kg  Vital Signs: Temp: 98.7 F (37.1 C) (09/05 0337) Temp Source: Axillary (09/05 0337) BP: 172/70 (09/05 0526) Pulse Rate: 106 (09/05 0337) Intake/Output from previous day: 09/04 0701 - 09/05 0700 In: [id number] [I.V.:1605; IV Piggyback:100] Out: 4500 [Urine:4500] Intake/Output from this shift: No intake/output data recorded.  Insulin Requirements in the past 24 hours:  9 units moderate SSI + 40 units regular insulin in TPN  Assessment: 89 YOF recently admitted to [facility] with gastric outlet obstruction due to necrotic esophagitis and discharged on home Unasyn and TPN to [facility].  Admitted to [facility] 04/17/2016 with shortness of breath, fever, and confusion.  PICC line was removed on admission due to concern for line infection- found to have fungemia.   She is to continue nutrition support with TPN.  GI: GOO on chronic TPN. Albumin up to 1.7. Prealbumin up at 8.1. EGD showed severe reflux esophagitis, a gastric tumor and gastric outlet obstruction. Got biopsy to rule out malignancy. GI recommends IR to place post-pyloric TFs - should be today. Per surgery, likely to go to OR this week for bypass of ulcer.  Endo: hx DM, was requiring 35 units of regular insulin in TPN during last admission and at [facility]. CBGs are now controlled (108-128) with SSI + 40 units of insulin in TPN Lytes: hyponatremia prior to TPN initiation. Na currently 133. K 3.7. Mg 1.5 after 2 gm given yesterday;  Renal: SCr stable, CrCL ~55-58ml/min. UOP not all charted Hepatobil: LFTs normalized, TG WNL.  Tbili 0.6. ID: Blood cx detected candida glabrata. Started on anidulafungin for fungemia. Spoke with ID and since new PICC placed after positive blood cx they are ok with continuing TPN for now. If repeat cx remains positive may have to consider line holiday. So far it is ngtd x 4 days. Afebrile, WBC wnl Best Practices: Protonix IV BID, SCDs TPN Access: central line placed 05/16/16 TPN start date: on TPN PTA  Current Nutrition:  NPO TPN   TPN at [facility]: (information obtained on 8/30 from [place] - pharmacist at [place]) Clinimix E 5/15 1440 ml + 20% lipids 192 ml 35 units of regular insulin ordered for each bag - per discussion there is question if this was actually being added to each bag at the facility  Nutritional Goals: per RD on 8/30 1500-1700 kCal 70-80 grams of protein per day  Plan:  Continue Clinimix E 5/15 at 1ml/hr Continue 20% lipid emulsion at 78ml/hr TPN provides 78 g of protein and 1560 kCals per day meeting 100% of patient needs MVI and TE in TPN Continue 40 units of regular insulin in TPN Continue moderate SSI and adjust as needed Monitor TPN labs, repeat blood cx's (See ID section) F/U trial of post-pyloric TFs 4 gm magnesium for repletion  Eudelia Bunch, Pharm.D. [id number] 05/22/2016 10:47 AM

## 2016-05-22 NOTE — Progress Notes (Signed)
[facility] Progress Note  5 Days Post-Op  Subjective: In and out of sleep. C/o throat irriation. Denies abdominal pain. No BM's documented.  Objective: Vital signs in last 24 hours: Temp:  [97.6 F (36.4 C)-98.9 F (37.2 C)] 98.7 F (37.1 C) (09/05 0337) Pulse Rate:  [98-111] 106 (09/05 0337) Resp:  [19-22] 19 (09/05 0337) BP: (158-177)/(57-75) 172/70 (09/05 0526) SpO2:  [91 %-95 %] 94 % (09/05 0337) Last BM Date: 05/20/16  Intake/Output from previous day: 09/04 0701 - 09/05 0700 In: 1705 [I.V.:1605; IV Piggyback:100] Out: 4500 [Urine:4500] Intake/Output this shift: No intake/output data recorded.  PE: Gen:  Alert, NAD HEENT: 14 Fr NGT in L nare , 35F NGT in R nare - both clamped Pulm:  CTABL, no W/R/R Abd: Soft, NT,mild distention, +BS   Lab Results:   Recent Labs  05/21/16 0240 05/22/16 0500  WBC 4.7 5.5  HGB 10.5* 10.8*  HCT 32.7* 33.7*  PLT 182 196   BMET  Recent Labs  05/21/16 0240 05/22/16 0500  NA 134* 133*  K 4.2 3.7  CL 105 101  CO2 25 24  GLUCOSE 109* 113*  BUN 14 16  CREATININE 0.68 0.68  CALCIUM 7.8* 8.1*   PT/INR  Recent Labs  05/22/16 0500  LABPROT 15.5*  INR 1.22   CMP     Component Value Date/Time   NA 133 (L) 05/22/2016 0500   K 3.7 05/22/2016 0500   CL 101 05/22/2016 0500   CO2 24 05/22/2016 0500   GLUCOSE 113 (H) 05/22/2016 0500   BUN 16 05/22/2016 0500   CREATININE 0.68 05/22/2016 0500   CREATININE 0.78 11/07/2012 1457   CALCIUM 8.1 (L) 05/22/2016 0500   PROT 4.6 (L) 05/21/2016 0240   ALBUMIN 1.7 (L) 05/21/2016 0240   AST 27 05/21/2016 0240   ALT 22 05/21/2016 0240   ALKPHOS 74 05/21/2016 0240   BILITOT 0.6 05/21/2016 0240   GFRNONAA >60 05/22/2016 0500   GFRAA >60 05/22/2016 0500   Lipase  No results found for: LIPASE  Studies/Results: No results found.  Anti-infectives: Anti-infectives    Start     Dose/Rate Route Frequency Ordered Stop   05/18/16 1700  anidulafungin ([name]) 100 mg in  sodium chloride 0.9 % 100 mL IVPB     100 mg over 90 Minutes Intravenous Every 24 hours 04/23/2016 1627     04/28/2016 1700  anidulafungin ([name]) 200 mg in sodium chloride 0.9 % 200 mL IVPB     200 mg over 180 Minutes Intravenous  Once 05/06/2016 1627 04/25/2016 2003   05/16/16 0200  vancomycin (VANCOCIN) IVPB 750 mg/150 ml premix  Status:  Discontinued     750 mg 150 mL/hr over 60 Minutes Intravenous Every 12 hours 05/14/2016 1409 05/16/16 1045   05/14/2016 1400  fluconazole (DIFLUCAN) IVPB 100 mg  Status:  Discontinued     100 mg 50 mL/hr over 60 Minutes Intravenous Every 24 hours 05/16/2016 1317 04/27/2016 1653   04/29/2016 1400  vancomycin (VANCOCIN) IVPB 1000 mg/200 mL premix  Status:  Discontinued     1,000 mg 200 mL/hr over 60 Minutes Intravenous  Once 04/30/2016 1353 05/03/2016 1407   05/05/2016 1330  vancomycin (VANCOCIN) IVPB 1000 mg/200 mL premix     1,000 mg 200 mL/hr over 60 Minutes Intravenous  Once 05/14/2016 1325 05/13/2016 1525   04/18/2016 1300  ceFEPIme (MAXIPIME) 2 g in dextrose 5 % 50 mL IVPB  Status:  Discontinued     2 g 100 mL/hr over 30  Minutes Intravenous Every 12 hours 05/07/2016 1257 05/16/16 1043     Assessment/Plan Acute GI bleed / esophageal necrosis / gastric outlet obstruction with bowel ulceration s/p Procedure(s): ESOPHAGOGASTRODUODENOSCOPY (EGD) WITH PROPOFOL - 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

## 2016-05-22 NOTE — Plan of Care (Signed)
Problem: Nutrition: Goal: Adequate nutrition will be maintained Outcome: Progressing Pt is currently on TPN but is scheduled for cortrak NGT placement today.

## 2016-05-22 NOTE — Care Management Important Message (Signed)
Important Message  Patient Details  Name: [name] [name] MRN: [id number] Date of Birth: 1935-04-29   Medicare Important Message Given:  Yes    Chrstopher Malenfant Abena 05/22/2016, 4:36 PM

## 2016-05-22 NOTE — Progress Notes (Signed)
Daily Rounding Note  05/22/2016, 12:43 PM  LOS: 7 days   SUBJECTIVE:   Chief complaint: epigastric area pain.   Persistent discomfort in epigastrium, mild to moderate intensity.    Dr Sherral Hammers called GI regarding results of gastric biopsy: within an area of inflammation and ulceration are atypical features that may represent adenoca.  To confirm the diagnosis, Dr  Saralyn Pilar feels he needs a deeper biopsy.   Post pyloric FT only just placed today.  Has been receiving TNA.   Remains on NGT at LIS.  Not much output: 178ml on 8/31, 13ml on 9/2, 69ml  on 9/3.   Surgery following and planning possible surgery next week.     OBJECTIVE:         Vital signs in last 24 hours:    Temp:  [98.5 F (36.9 C)-98.9 F (37.2 C)] 98.7 F (37.1 C) (09/05 0337) Pulse Rate:  [98-111] 106 (09/05 0337) Resp:  [19-22] 19 (09/05 0337) BP: (158-177)/(57-75) 171/70 (09/05 1124) SpO2:  [91 %-95 %] 94 % (09/05 0337) Last BM Date: 05/20/16 Filed Weights   04/26/2016 1113 05/13/2016 1944 05/19/16 0319  Weight: 77.2 kg (170 lb 3.2 oz) 82.7 kg (182 lb 5.1 oz) 87.6 kg (193 lb 2 oz)   General: obese, weak, looks ill and frail.  But looks better than last week.    ENT: NGT in right nares currently clamped, feeding tube inn left nares Heart: RRR Chest: diminished but clear, rough vocal quality.  No cough.  Some dyspnea with minor effort Abdomen: soft, active BS, NT, ND  Extremities: non-pitting LE edema Neuro/Psych:  Alert. Oriented x 3.  Appropriate.  Moves all 4s, strength not tested.  MS much improved c/w last week.   Intake/Output from previous day: 09/04 0701 - 09/05 0700 In: 1705 [I.V.:1605; IV Piggyback:100] Out: 4500 [Urine:4500]  Intake/Output this shift: No intake/output data recorded.  Lab Results:  Recent Labs  05/20/16 0545 05/21/16 0240 05/22/16 0500  WBC 3.7* 4.7 5.5  HGB 6.2* 10.5* 10.8*  HCT 20.2* 32.7* 33.7*  PLT 140* 182 196    BMET  Recent Labs  05/20/16 0545 05/21/16 0240 05/22/16 0500  NA 136 134* 133*  K 3.4* 4.2 3.7  CL 111 105 101  CO2 22 25 24   GLUCOSE 143* 109* 113*  BUN 13 14 16   CREATININE 0.51 0.68 0.68  CALCIUM 6.6* 7.8* 8.1*   LFT  Recent Labs  05/21/16 0240  PROT 4.6*  ALBUMIN 1.7*  AST 27  ALT 22  ALKPHOS 74  BILITOT 0.6   PT/INR  Recent Labs  05/22/16 0500  LABPROT 15.5*  INR 1.22   Hepatitis Panel No results for input(s): HEPBSAG, HCVAB, HEPAIGM, HEPBIGM in the last 72 hours.  Studies/Results: Dg Naso G Tube Plc W/fl W/rad  Result Date: 05/22/2016 CLINICAL DATA:  Replacement of NG tube and placement of a feeding tube. EXAM: NASO G TUBE PLACEMENT WITH FL AND WITH RAD CONTRAST:  20 cc Gastrografin FLUOROSCOPY TIME:  Fluoroscopy Time:  20 minutes Radiation Exposure Index (if provided by the fluoroscopic device): 5341.6 mGy Number of Acquired Spot Images: 0 COMPARISON:  CT scan 04/17/2016 FINDINGS: The radiology technologist Baxter Flattery Dingus). Remove the NG tube which was coronal back on itself in the esophagus and replace and NG tube into the stomach. She also advanced a feeding tube into the stomach but could not advance it into the duodenum. I could not advance it into the duodenum  either. IMPRESSION: NG tube and feeding tubes in the stomach. The feeding tube could not be advanced into the duodenum. Electronically Signed   By: Marijo Sanes M.D.   On: 05/22/2016 11:39   Scheduled Meds: . anidulafungin  100 mg Intravenous Q24H  . antiseptic oral rinse  7 mL Mouth Rinse q12n4p  . chlorhexidine  15 mL Mouth Rinse BID  . feeding supplement (VITAL AF 1.2 CAL)  1,000 mL Per Tube Q24H  . hydrALAZINE  5 mg Intravenous Q6H  . insulin aspart  0-15 Units Subcutaneous Q4H  . magnesium sulfate 1 - 4 g bolus IVPB  4 g Intravenous Once  . pantoprazole  40 mg Intravenous Q12H  . sodium chloride flush  3 mL Intravenous Q12H   Continuous Infusions: . Marland KitchenTPN (CLINIMIX-E) Adult 65 mL/hr at  05/21/16 1745   And  . fat emulsion 240 mL (05/21/16 1746)  . Marland KitchenTPN (CLINIMIX-E) Adult     And  . fat emulsion     PRN Meds:.acetaminophen, albuterol, hydrALAZINE, ondansetron, phenol   ASSESMENT:   * Severe, necrotic esophagitis in setting of GOO. EGDs 8/1, 8/6, 8/31:  8/1 esophageal biopsy showed fungal elements, received/completed IV Diflucan and on chronic Protonix. Repeat EGD 8/31: severe reflux esophagitis.  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 Pathology with ? Adenoca.   * Gastric mass/polyp. Initial path: hyperplastic. Planned outpt EUS 8/31 was cancelled due to acute illness/readmission. Now with possible adenoca on pathology.  If surgery is planned, not sure it is necessary to repeat EGD as pt needs at least palliative pyloroplasty vs gastro-jej at which time surgeon can obtain adequate tissue sample.  Pt does not look like a chemo candidate.    * Protein calorie malnutrition. On TPN.   * New sepsis. Suspect aspiration PNA  Fungemia with C Glabrata.  On Anidulafungin.     *  CHF per xray.  9/1 Echo EF 60 to 65%.   Acute hypoxia a/w aspiration and transient pneumonitis.   * Normocytic anemia.  Transfused  2 PRBCs on 9/3.  Hgb stable x 24 hours.   *  AMS.     PLAN   *  Will discuss case with Dr Henrene Pastor.  Again, with surgery planned once nutritional status improves, not sure we need to repeat EGD/biopsy unless it changes what surgeons plan.   Azucena Freed  05/22/2016, 12:43 PM Pager: [phone]  GI ATTENDING  History, laboratories, x-rays, and MULTIPLE EGDs reviewed. The patient has an obstructing mass at the level of the pylorus. As she will need definitive surgery, repeat EGD with biopsies won't change management at this time. If securing a diagnosis is important, recommend intraoperative wedge biopsy if process bypassed and not entirely resected.  Docia Chuck. Geri Seminole., M.D. [facility] Division of Gastroenterology

## 2016-05-23 DIAGNOSIS — E43 Unspecified severe protein-calorie malnutrition: Secondary | ICD-10-CM

## 2016-05-23 LAB — BASIC METABOLIC PANEL
Anion gap: 12 (ref 5–15)
BUN: 24 mg/dL — ABNORMAL HIGH (ref 6–20)
CHLORIDE: 95 mmol/L — AB (ref 101–111)
CO2: 23 mmol/L (ref 22–32)
CREATININE: 0.71 mg/dL (ref 0.44–1.00)
Calcium: 7.7 mg/dL — ABNORMAL LOW (ref 8.9–10.3)
GFR calc non Af Amer: >60 mL/min (ref >60)
Glucose, Bld: 149 mg/dL — ABNORMAL HIGH (ref 65–99)
Potassium: 3.6 mmol/L (ref 3.5–5.1)
SODIUM: 130 mmol/L — AB (ref 135–145)

## 2016-05-23 LAB — GLUCOSE, CAPILLARY
GLUCOSE-CAPILLARY: 149 mg/dL — AB (ref 65–99)
Glucose-Capillary: 187 mg/dL — ABNORMAL HIGH (ref 65–99)
Glucose-Capillary: 218 mg/dL — ABNORMAL HIGH (ref 65–99)
Glucose-Capillary: 209 mg/dL — ABNORMAL HIGH (ref 65–99)
Glucose-Capillary: 116 mg/dL — ABNORMAL HIGH (ref 65–99)

## 2016-05-23 LAB — MAGNESIUM: MAGNESIUM: 2.2 mg/dL (ref 1.7–2.4)

## 2016-05-23 MED ORDER — TRACE MINERALS CR-CU-MN-SE-ZN 10-1000-500-60 MCG/ML IV SOLN
INTRAVENOUS | Status: AC
  Administered 2016-05-23 (×2): via INTRAVENOUS
  Filled 2016-05-23: qty 1560

## 2016-05-23 MED ORDER — POTASSIUM CHLORIDE 10 MEQ/100ML IV SOLN
10.0000 meq | INTRAVENOUS | Status: AC
  Administered 2016-05-23 (×3): 10 meq via INTRAVENOUS
  Filled 2016-05-23: qty 100

## 2016-05-23 MED ORDER — SODIUM CHLORIDE 0.9% FLUSH
10.0000 mL | INTRAVENOUS | Status: DC | PRN
  Administered 2016-05-28 – 2016-05-30 (×2): 10 mL
  Filled 2016-05-23 (×2): qty 40

## 2016-05-23 MED ORDER — FAT EMULSION 20 % IV EMUL
240.0000 mL | INTRAVENOUS | Status: AC
  Administered 2016-05-23: 240 mL via INTRAVENOUS
  Filled 2016-05-23: qty 250

## 2016-05-23 MED ORDER — ALTEPLASE 2 MG IJ SOLR: 2.0000 mg | Freq: Once | INTRAMUSCULAR | Status: DC

## 2016-05-23 MED ORDER — ALTEPLASE 2 MG IJ SOLR
2.0000 mg | Freq: Once | INTRAMUSCULAR | Status: AC
  Administered 2016-05-23: 2 mg

## 2016-05-23 NOTE — Progress Notes (Signed)
Occupational Therapy Treatment Patient Details Name: [name] [name] MRN: [id number] DOB: 1935-04-03 Today's Date: 05/23/2016    History of present illness 81 y.o.femalewith history of degenerative disc disease, DM, diverticulosis, GERD, hiatal hernia, HLD, HTN, and chronic hyponatremia who was admitted 04/14/2016 >04/27/2016 for treatment of acute GI bleed with gastric outlet obstruction due to necrotic esophagitis with superficial fungal infection and ulceration with subsequent development of possible right-sided pneumonitis and aspiration pneumonia. She returned to the ED c/o shortness of breath, fevers, and confusion. +sepsis (?due to PICC)   OT comments  Pt making progress with functional goals. OT will continue to follow acutely  Follow Up Recommendations  SNF    Equipment Recommendations  None recommended by OT    Recommendations for Other Services      Precautions / Restrictions Precautions Precautions: Fall Precaution Comments: NG tube; central IJ for TPN; peripheral IV; O2, panda Restrictions Weight Bearing Restrictions: No       Mobility Bed Mobility Overal bed mobility: Needs Assistance Bed Mobility: Supine to Sit   Sidelying to sit: Mod assist;HOB elevated Supine to sit: Mod assist        Transfers Overall transfer level: Needs assistance Equipment used: Rolling walker (2 wheeled) Transfers: Sit to/from Stand Sit to Stand: Mod assist Stand pivot transfers: Min assist            Balance     Sitting balance-Leahy Scale: Fair       Standing balance-Leahy Scale: Poor                     ADL       Grooming: Sitting;Min guard;Wash/dry face;Wash/dry hands;Brushing hair   Upper Body Bathing: Sitting;Minimal assitance               Toilet Transfer: Minimal assistance;Stand-pivot;BSC;RW   Toileting- Clothing Manipulation and Hygiene: Moderate assistance;Sit to/from stand       Functional mobility during ADLs: Minimal  assistance;Rolling walker        Vision  no change from baseline                              Cognition   Behavior During Therapy: [facility] for tasks assessed/performed Overall Cognitive Status: Within Functional Limits for tasks assessed                       Extremity/Trunk Assessment   generalized weakness                        General Comments  pt pleasant and cooperative    Pertinent Vitals/ Pain       Pain Assessment: No/denies pain                                                          Frequency Min 2X/week     Progress Toward Goals  OT Goals(current goals can now be found in the care plan section)  Progress towards OT goals: Progressing toward goals     Plan Discharge plan remains appropriate                     End of Session Equipment Utilized During Treatment: Rolling walker;Gait belt  Activity Tolerance Patient tolerated treatment well   Patient Left in chair;with call bell/phone within reach   Nurse Communication  RN present to assist with moving lines, etc...        Time: [id number] OT Time Calculation (min): 29 min  Charges: OT General Charges $OT Visit: 1 Procedure OT Treatments $Self Care/Home Management : 8-22 mins $Therapeutic Activity: 8-22 mins  Britt Bottom 05/23/2016, 2:51 PM

## 2016-05-23 NOTE — Progress Notes (Signed)
INFECTIOUS DISEASE PROGRESS NOTE  ID: [name] [name] is a 81 y.o. female with  Principal Problem:   Fungemia Active Problems:   DM2 (diabetes mellitus, type 2) ([place])   Gastric outlet obstruction   Esophageal necrosis   Sepsis ([place])   Acute respiratory failure (HCC)   Acute encephalopathy   On total parenteral nutrition (TPN)   Normocytic anemia   Essential hypertension   Erosive esophagitis   Candida glabrata infection   Gastric mass   Gastric adenocarcinoma (HCC)  Subjective: No complaints Up to chair yesterday On bed pan this am.   Abtx:  Anti-infectives    Start     Dose/Rate Route Frequency Ordered Stop   05/18/16 1700  anidulafungin ([name]) 100 mg in sodium chloride 0.9 % 100 mL IVPB     100 mg over 90 Minutes Intravenous Every 24 hours 04/26/2016 1627     05/07/2016 1700  anidulafungin ([name]) 200 mg in sodium chloride 0.9 % 200 mL IVPB     200 mg over 180 Minutes Intravenous  Once 05/16/2016 1627 04/17/2016 2003   05/16/16 0200  vancomycin (VANCOCIN) IVPB 750 mg/150 ml premix  Status:  Discontinued     750 mg 150 mL/hr over 60 Minutes Intravenous Every 12 hours 04/25/2016 1409 05/16/16 1045   05/02/2016 1400  fluconazole (DIFLUCAN) IVPB 100 mg  Status:  Discontinued     100 mg 50 mL/hr over 60 Minutes Intravenous Every 24 hours 05/01/2016 1317 05/03/2016 1653   05/11/2016 1400  vancomycin (VANCOCIN) IVPB 1000 mg/200 mL premix  Status:  Discontinued     1,000 mg 200 mL/hr over 60 Minutes Intravenous  Once 05/16/2016 1353 04/24/2016 1407   05/09/2016 1330  vancomycin (VANCOCIN) IVPB 1000 mg/200 mL premix     1,000 mg 200 mL/hr over 60 Minutes Intravenous  Once 05/05/2016 1325 04/21/2016 1525   04/20/2016 1300  ceFEPIme (MAXIPIME) 2 g in dextrose 5 % 50 mL IVPB  Status:  Discontinued     2 g 100 mL/hr over 30 Minutes Intravenous Every 12 hours 04/21/2016 1257 05/16/16 1043      Medications:  Scheduled: . anidulafungin  100 mg Intravenous Q24H  . antiseptic oral rinse  7 mL Mouth  Rinse q12n4p  . chlorhexidine  15 mL Mouth Rinse BID  . feeding supplement (VITAL AF 1.2 CAL)  1,000 mL Per Tube Q24H  . hydrALAZINE  5 mg Intravenous Q6H  . insulin aspart  0-15 Units Subcutaneous Q4H  . pantoprazole  40 mg Intravenous Q12H  . sodium chloride flush  3 mL Intravenous Q12H    Objective: Vital signs in last 24 hours: Temp:  [98.1 F (36.7 C)-98.8 F (37.1 C)] 98.5 F (36.9 C) (09/06 0435) Pulse Rate:  [81-94] 81 (09/06 0435) Resp:  [15-19] 18 (09/06 0435) BP: (140-171)/(61-70) 140/69 (09/06 0435) SpO2:  [90 %-94 %] 92 % (09/06 0435) Weight:  [85.9 kg (189 lb 6 oz)-87 kg (191 lb 12.8 oz)] 85.9 kg (189 lb 6 oz) (09/06 0508)   General appearance: alert, cooperative, fatigued and no distress Resp: clear to auscultation bilaterally Cardio: regular rate and rhythm GI: normal findings: bowel sounds normal and soft, non-tender  Lab Results  Recent Labs  05/21/16 0240 05/22/16 0500 05/23/16 0500  WBC 4.7 5.5  --   HGB 10.5* 10.8*  --   HCT 32.7* 33.7*  --   NA 134* 133* 130*  K 4.2 3.7 3.6  CL 105 101 95*  CO2 25 24 23   BUN  14 16 24*  CREATININE 0.68 0.68 0.71   Liver Panel  Recent Labs  05/21/16 0240  PROT 4.6*  ALBUMIN 1.7*  AST 27  ALT 22  ALKPHOS 74  BILITOT 0.6   Sedimentation Rate No results for input(s): ESRSEDRATE in the last 72 hours. C-Reactive Protein No results for input(s): CRP in the last 72 hours.  Microbiology: Recent Results (from the past 240 hour(s))  Blood Culture (routine x 2)     Status: Abnormal (Preliminary result)   Collection Time: 04/22/2016 11:00 AM  Result Value Ref Range Status   Specimen Description BLOOD LEFT HAND  Final   Special Requests BOTTLES DRAWN AEROBIC AND ANAEROBIC  5CC  Final   Culture  Setup Time   Final    YEAST AEROBIC BOTTLE ONLY CRITICAL RESULT CALLED TO, READ BACK BY AND VERIFIED WITH: Colin Rhein, PHARM AT 1812 ON [id number] BY S. YARBROUGH    Culture (A)  Final    CANDIDA GLABRATA WILL REFER TO  [facility] FOR ANTIFUNGAL SUSCEPTIBILITIES PER PHYSICIAN REQUEST    Report Status PENDING  Incomplete  Blood Culture (routine x 2)     Status: Abnormal   Collection Time: 05/10/2016 11:13 AM  Result Value Ref Range Status   Specimen Description BLOOD RIGHT ANTECUBITAL  Final   Special Requests BOTTLES DRAWN AEROBIC AND ANAEROBIC  5CC  Final   Culture  Setup Time (A)  Final    YEAST AEROBIC BOTTLE ONLY CRITICAL RESULT CALLED TO, READ BACK BY AND VERIFIED WITH: Colin Rhein, PHARM AT 1706 ON [id number] BY S. YARBROUGH    Culture CANDIDA GLABRATA (A)  Final   Report Status 05/19/2016 FINAL  Final  Blood Culture ID Panel (Reflexed)     Status: Abnormal   Collection Time: 04/21/2016 11:13 AM  Result Value Ref Range Status   Enterococcus species NOT DETECTED NOT DETECTED Final   Listeria monocytogenes NOT DETECTED NOT DETECTED Final   Staphylococcus species NOT DETECTED NOT DETECTED Final   Staphylococcus aureus NOT DETECTED NOT DETECTED Final   Streptococcus species NOT DETECTED NOT DETECTED Final   Streptococcus agalactiae NOT DETECTED NOT DETECTED Final   Streptococcus pneumoniae NOT DETECTED NOT DETECTED Final   Streptococcus pyogenes NOT DETECTED NOT DETECTED Final   Acinetobacter baumannii NOT DETECTED NOT DETECTED Final   Enterobacteriaceae species NOT DETECTED NOT DETECTED Final   Enterobacter cloacae complex NOT DETECTED NOT DETECTED Final   Escherichia coli NOT DETECTED NOT DETECTED Final   Klebsiella oxytoca NOT DETECTED NOT DETECTED Final   Klebsiella pneumoniae NOT DETECTED NOT DETECTED Final   Proteus species NOT DETECTED NOT DETECTED Final   Serratia marcescens NOT DETECTED NOT DETECTED Final   Haemophilus influenzae NOT DETECTED NOT DETECTED Final   Neisseria meningitidis NOT DETECTED NOT DETECTED Final   Pseudomonas aeruginosa NOT DETECTED NOT DETECTED Final   Candida albicans NOT DETECTED NOT DETECTED Final   Candida glabrata DETECTED (A) NOT DETECTED Final    Comment: CRITICAL  RESULT CALLED TO, READ BACK BY AND VERIFIED WITH: Michele Mcalpine AT 1706 ON [id number] BY S. YARBROUGH    Candida krusei NOT DETECTED NOT DETECTED Final   Candida parapsilosis NOT DETECTED NOT DETECTED Final   Candida tropicalis NOT DETECTED NOT DETECTED Final  Urine culture     Status: None   Collection Time: 04/22/2016  1:00 PM  Result Value Ref Range Status   Specimen Description URINE, CATHETERIZED  Final   Special Requests NONE  Final   Culture NO GROWTH  Final  Report Status 05/16/2016 FINAL  Final  MRSA PCR Screening     Status: None   Collection Time: 04/18/2016  6:54 PM  Result Value Ref Range Status   MRSA by PCR NEGATIVE NEGATIVE Final    Comment:        The GeneXpert MRSA Assay (FDA approved for NASAL specimens only), is one component of a comprehensive MRSA colonization surveillance program. It is not intended to diagnose MRSA infection nor to guide or monitor treatment for MRSA infections.   Culture, blood (routine x 2)     Status: None   Collection Time: 05/09/2016  6:19 PM  Result Value Ref Range Status   Specimen Description BLOOD LEFT HAND  Final   Special Requests IN PEDIATRIC BOTTLE 1.5CC  Final   Culture NO GROWTH 5 DAYS  Final   Report Status 05/22/2016 FINAL  Final  Culture, blood (routine x 2)     Status: None   Collection Time: 05/08/2016  6:36 PM  Result Value Ref Range Status   Specimen Description BLOOD RIGHT HAND  Final   Special Requests IN PEDIATRIC BOTTLE 0.5CC  Final   Culture NO GROWTH 5 DAYS  Final   Report Status 05/22/2016 FINAL  Final    Studies/Results: Dg Naso G Tube Plc W/fl W/rad  Result Date: 05/22/2016 CLINICAL DATA:  Replacement of NG tube and placement of a feeding tube. EXAM: NASO G TUBE PLACEMENT WITH FL AND WITH RAD CONTRAST:  20 cc Gastrografin FLUOROSCOPY TIME:  Fluoroscopy Time:  20 minutes Radiation Exposure Index (if provided by the fluoroscopic device): 5341.6 mGy Number of Acquired Spot Images: 0 COMPARISON:  CT scan 04/17/2016  FINDINGS: The radiology technologist Baxter Flattery Dingus). Remove the NG tube which was coronal back on itself in the esophagus and replace and NG tube into the stomach. She also advanced a feeding tube into the stomach but could not advance it into the duodenum. I could not advance it into the duodenum either. IMPRESSION: NG tube and feeding tubes in the stomach. The feeding tube could not be advanced into the duodenum. Electronically Signed   By: Marijo Sanes M.D.   On: 05/22/2016 11:39     Assessment/Plan: C glabrata fungemia due to [facility]  Aspiration PNA  Gastric mass- hyperplastic polyp on Bx 8-6  DM2  Protein calorie malnutrition, severe  No change in eraxis (sensi pending) Plan for 14 days.  Repeat BCx 8-31 negative Appreciate continued f/u from nutrition Please call if questions  Total days of antibiotics: 8 eraxis         Bobby Rumpf Infectious Diseases (pager) [phone] [email or web address] 05/23/2016, 9:17 AM  LOS: 8 days

## 2016-05-23 NOTE — Progress Notes (Signed)
[place] TEAM 1 - Stepdown/ICU TEAM  [name] [name]  [id number] DOB: 12-01-1934 DOA: 05/09/2016 PCP: [name], MD    Brief Narrative:  81 y.o. female with history of degenerative disc disease, DM, diverticulosis, GERD, hiatal hernia, HLD, HTN, and chronic hyponatremia who was admitted 04/14/2016 > 04/27/2016 for treatment of acute GI bleed with gastric outlet obstruction due to necrotic esophagitis with superficial fungal infection and ulceration with subsequent development of possible right-sided pneumonitis and aspiration pneumonia. Patient was discharged on Unasyn and Diflucan and completed her course on 05/14/2016.  A Left arm PICC was placed for home Unasyn and TPN. She returned to the ED c/o shortness of breath and fevers. In the ED she was noted to be confused.    Subjective: Attempts to place a duodenal feeding tube in radiology failed yesterday with KUB confirming feeding tube is only in the stomach.  Given the gastric outlet obstruction this cannot be used for tube feedings.  I have asked the nurse to stop the patient's tube feedings immediately and will resume intermittent NG suction.  To avoid confusion the feeding tube will be removed.  The patient is resting comfortably this morning.  She reports some nausea but no vomiting.  She denies chest pain shortness of breath fevers chills or headache.  She is anxious to know when she can have her surgery.  Assessment & Plan:  Sepsis due to Candida glabrata fungemia - suspected PICC source  PICC removed at time of admission - new CVL placed 8/30 - cont antifungal to complete 14 days of treatment per ID recs - sepsis physiology has resolved  Esophageal necrosis / gastric outlet obstruction with bowel ulceration / gastric prepyloric polypoid mass suspicious for adenocarcinoma on bx  EGD this admit confirms peri-pyloric polypoid mass extending to pylorus and leading to obstruction - Gen Surg following w/ plans for eventual  pyloroplasty vs. gastro-jej - Radiology unable to place post-pyloric feeding tube - keep NG in place for suction but remove feeding tube as it would be unwise to use it for continued feeding - continue TNA as enteral feeds do not appear to be an option at this time   Normocytic anemia Hemoglobin 8.4 - baseline at time of recent D/C ~9.0  - recent GI bleed - no evidence of large scale active bleeding - suspect drop represented poor production related to malnutrition as well as dilution from volume resuscitation - transfused 2 units PRBC 9/3 - hemoglobin stable  Acute hypoxic respiratory failure - resolved Likely transient pneumonitis due to aspiration v/s simple hypoventilation due to AMS  AMS likely multifactorial - appears to have returned to her baseline mental status  Chronic Hyponatremia stable  - follow trend    Hypokalemia Corrected as needed to keep K+ ~4.0  HTN Blood pressure reasonably controlled at the present time  DM2 CBG reasonably controlled   DVT prophylaxis: SCDs Code Status: NO CODE - DNR Family Communication: No family present at time of exam today Disposition Plan: Awaiting surgical resection of gastric mass leading to [place]  Consultants:  GI Gen Surgery  ID  Procedures: 8/30 CVL insertion per PCCM 8/31 EGD - Severe esophagitis with diffuse ulceration and sloughing of mucosa from EG junction at 37cm to proximal esophagus / large, polypoid, non-circumferential subepithelial mass in the prepyloric region causing GOO  9/1 TTE - EF 60-65 % - no wall motion abnormalities  Antimicrobials:  Cefepime 8/29 Diflucan 8/29 Vancomycin 8/29  anidulafungin 8/31 >  Objective: Blood pressure 140/69, pulse  81, temperature 98.5 F (36.9 C), temperature source Axillary, resp. rate 18, height 5\' 2"  (1.575 m), weight 85.9 kg (189 lb 6 oz), SpO2 92 %.  Intake/Output Summary (Last 24 hours) at 05/23/16 0841 Last data filed at 05/23/16 0500  Gross per 24 hour  Intake           1483.08 ml  Output              100 ml  Net          1383.08 ml   Filed Weights   05/19/16 0319 05/23/16 0105 05/23/16 0508  Weight: 87.6 kg (193 lb 2 oz) 87 kg (191 lb 12.8 oz) 85.9 kg (189 lb 6 oz)    Examination: General: No acute respiratory distress - alert Lungs: Clear to auscultation without wheezing or focal crackles Cardiovascular: Regular rate and rhythm without murmur or gallop Abdomen: Nontender, overweight, soft, bowel sounds positive, no appreciable mass Extremities: No significant cyanosis, or clubbing;  1+ pedal edema B LE  CBC:  Recent Labs Lab 04/30/2016 0745 05/20/16 0545 05/21/16 0240 05/22/16 0500  WBC 4.3 3.7* 4.7 5.5  NEUTROABS  --   --  2.6  --   HGB 7.1* 6.2* 10.5* 10.8*  HCT 22.5* 20.2* 32.7* 33.7*  MCV 85.9 86.0 83.2 82.4  PLT 124* 140* 182 [id number]   Basic Metabolic Panel:  Recent Labs Lab 04/17/2016 0617 05/18/16 0316 05/19/16 0354 05/20/16 0545 05/21/16 0240 05/22/16 0500 05/23/16 0500  NA 133* 133* 134* 136 134* 133* 130*  K 3.2* 3.5 3.9 3.4* 4.2 3.7 3.6  CL 100* 101 105 111 105 101 95*  CO2 28 25 25 22 25 24 23   GLUCOSE 152* 154* 141* 143* 109* 113* 149*  BUN 20 20 17 13 14 16  24*  CREATININE 0.74 0.69 0.66 0.51 0.68 0.68 0.71  CALCIUM 6.9* 7.2* 7.5* 6.6* 7.8* 8.1* 7.7*  MG 1.8 2.1  --   --  1.4* 1.5* 2.2  PHOS 2.5 2.5  --   --  3.3  --   --    GFR: Estimated Creatinine Clearance: 58.9 mL/min (by C-G formula based on SCr of 0.8 mg/dL).  Liver Function Tests:  Recent Labs Lab 04/23/2016 0617 05/21/16 0240  AST 41 27  ALT 30 22  ALKPHOS 66 74  BILITOT 0.2* 0.6  PROT 3.9* 4.6*  ALBUMIN 1.3* 1.7*    Coagulation Profile:  Recent Labs Lab 05/22/16 0500  INR 1.22    HbA1C: Hgb A1c MFr Bld  Date/Time Value Ref Range Status  04/15/2016 01:15 PM 6.6 (H) 4.8 - 5.6 % Final    Comment:    (NOTE)         Pre-diabetes: 5.7 - 6.4         Diabetes: >6.4         Glycemic control for adults with diabetes: <7.0      CBG:  Recent Labs Lab 05/22/16 1303 05/22/16 1626 05/22/16 2005 05/22/16 2354 05/23/16 0318  GLUCAP 156* 128* 138* 164* 149*    Recent Results (from the past 240 hour(s))  Blood Culture (routine x 2)     Status: Abnormal (Preliminary result)   Collection Time: 05/01/2016 11:00 AM  Result Value Ref Range Status   Specimen Description BLOOD LEFT HAND  Final   Special Requests BOTTLES DRAWN AEROBIC AND ANAEROBIC  5CC  Final   Culture  Setup Time   Final    YEAST AEROBIC BOTTLE ONLY CRITICAL RESULT CALLED TO, READ BACK  BY AND VERIFIED WITH: Colin Rhein, PHARM AT 1812 ON [id number] BY Rhea Bleacher    Culture (A)  Final    CANDIDA GLABRATA WILL REFER TO [facility] FOR ANTIFUNGAL SUSCEPTIBILITIES PER PHYSICIAN REQUEST    Report Status PENDING  Incomplete  Blood Culture (routine x 2)     Status: Abnormal   Collection Time: 05/13/2016 11:13 AM  Result Value Ref Range Status   Specimen Description BLOOD RIGHT ANTECUBITAL  Final   Special Requests BOTTLES DRAWN AEROBIC AND ANAEROBIC  5CC  Final   Culture  Setup Time (A)  Final    YEAST AEROBIC BOTTLE ONLY CRITICAL RESULT CALLED TO, READ BACK BY AND VERIFIED WITH: Colin Rhein, PHARM AT [place] ON [id number] BY S. YARBROUGH    Culture CANDIDA GLABRATA (A)  Final   Report Status 05/19/2016 FINAL  Final  Blood Culture ID Panel (Reflexed)     Status: Abnormal   Collection Time: 05/08/2016 11:13 AM  Result Value Ref Range Status   Enterococcus species NOT DETECTED NOT DETECTED Final   Listeria monocytogenes NOT DETECTED NOT DETECTED Final   Staphylococcus species NOT DETECTED NOT DETECTED Final   Staphylococcus aureus NOT DETECTED NOT DETECTED Final   Streptococcus species NOT DETECTED NOT DETECTED Final   Streptococcus agalactiae NOT DETECTED NOT DETECTED Final   Streptococcus pneumoniae NOT DETECTED NOT DETECTED Final   Streptococcus pyogenes NOT DETECTED NOT DETECTED Final   Acinetobacter baumannii NOT DETECTED NOT DETECTED Final   Enterobacteriaceae  species NOT DETECTED NOT DETECTED Final   Enterobacter cloacae complex NOT DETECTED NOT DETECTED Final   Escherichia coli NOT DETECTED NOT DETECTED Final   Klebsiella oxytoca NOT DETECTED NOT DETECTED Final   Klebsiella pneumoniae NOT DETECTED NOT DETECTED Final   Proteus species NOT DETECTED NOT DETECTED Final   Serratia marcescens NOT DETECTED NOT DETECTED Final   Haemophilus influenzae NOT DETECTED NOT DETECTED Final   Neisseria meningitidis NOT DETECTED NOT DETECTED Final   Pseudomonas aeruginosa NOT DETECTED NOT DETECTED Final   Candida albicans NOT DETECTED NOT DETECTED Final   Candida glabrata DETECTED (A) NOT DETECTED Final    Comment: CRITICAL RESULT CALLED TO, READ BACK BY AND VERIFIED WITH: Michele Mcalpine AT 1706 ON [id number] BY S. YARBROUGH    Candida krusei NOT DETECTED NOT DETECTED Final   Candida parapsilosis NOT DETECTED NOT DETECTED Final   Candida tropicalis NOT DETECTED NOT DETECTED Final  Urine culture     Status: None   Collection Time: 04/23/2016  1:00 PM  Result Value Ref Range Status   Specimen Description URINE, CATHETERIZED  Final   Special Requests NONE  Final   Culture NO GROWTH  Final   Report Status 05/16/2016 FINAL  Final  MRSA PCR Screening     Status: None   Collection Time: 05/16/2016  6:54 PM  Result Value Ref Range Status   MRSA by PCR NEGATIVE NEGATIVE Final    Comment:        The GeneXpert MRSA Assay (FDA approved for NASAL specimens only), is one component of a comprehensive MRSA colonization surveillance program. It is not intended to diagnose MRSA infection nor to guide or monitor treatment for MRSA infections.   Culture, blood (routine x 2)     Status: None   Collection Time: 05/16/2016  6:19 PM  Result Value Ref Range Status   Specimen Description BLOOD LEFT HAND  Final   Special Requests IN PEDIATRIC BOTTLE 1.5CC  Final   Culture NO GROWTH 5 DAYS  Final  Report Status 05/22/2016 FINAL  Final  Culture, blood (routine x 2)     Status:  None   Collection Time: 04/20/2016  6:36 PM  Result Value Ref Range Status   Specimen Description BLOOD RIGHT HAND  Final   Special Requests IN PEDIATRIC BOTTLE 0.5CC  Final   Culture NO GROWTH 5 DAYS  Final   Report Status 05/22/2016 FINAL  Final     Scheduled Meds: . anidulafungin  100 mg Intravenous Q24H  . antiseptic oral rinse  7 mL Mouth Rinse q12n4p  . chlorhexidine  15 mL Mouth Rinse BID  . feeding supplement (VITAL AF 1.2 CAL)  1,000 mL Per Tube Q24H  . hydrALAZINE  5 mg Intravenous Q6H  . insulin aspart  0-15 Units Subcutaneous Q4H  . pantoprazole  40 mg Intravenous Q12H  . sodium chloride flush  3 mL Intravenous Q12H   Continuous Infusions: . Marland KitchenTPN (CLINIMIX-E) Adult 65 mL/hr at 05/22/16 1714   And  . fat emulsion 240 mL (05/22/16 1714)     LOS: 8 days   Cherene Altes, MD [facility] Office  [phone] Pager - Text Page per Amion as per below:  On-Call/Text Page:      [email or web address]      password [email or web address]  If 7PM-7AM, please contact night-coverage [email or web address] Password [email or web address] 05/23/2016, 8:41 AM

## 2016-05-23 NOTE — Progress Notes (Signed)
PARENTERAL NUTRITION CONSULT NOTE - FOLLOW UP  Pharmacy Consult:  TPN Indication:  Gastric Outlet Obstruction  Allergies  Allergen Reactions  . Ace Inhibitors Cough  . Prednisone Other (See Comments)    LETHARGY  . Sitagliptin Other (See Comments)    Noted on [facility]   Patient Measurements: Height: 5\' 2"  (157.5 cm) Weight: 189 lb 6 oz (85.9 kg) IBW/kg (Calculated) : 50.1 Adjusted Body Weight: 75 kg Usual Weight: 80 kg  Vital Signs: Temp: 98.5 F (36.9 C) (09/06 0435) Temp Source: Axillary (09/06 0435) BP: 140/69 (09/06 0435) Pulse Rate: 81 (09/06 0435) Intake/Output from previous day: 09/05 0701 - 09/06 0700 In: 1483.1 [I.V.:1276.8; NG/GT:206.3] Out: 100 [Urine:100] Intake/Output from this shift: Total I/O In: -  Out: 50 [Urine:50]  Insulin Requirements in the past 24 hours:  12 units moderate SSI + 40 units regular insulin in TPN  Assessment: 62 YOF recently admitted to [facility] with gastric outlet obstruction due to necrotic esophagitis and discharged on home Unasyn and TPN to [facility].  Admitted to [facility] 04/29/2016 with shortness of breath, fever, and confusion.  PICC line was removed on admission due to concern for line infection- found to have fungemia.   She is to continue nutrition support with TPN.  GI: GOO on chronic TPN. Albumin up to 1.7. Prealbumin up at 8.1. EGD showed severe reflux esophagitis, a gastric tumor and gastric outlet obstruction. 9/5: IR failed to get feeding tube into duodenum 2nd GOO - feeding tube removed to avoid confusion.  Bx suspicious for adenoca. CCS plans eventual pyloroplasty vs gastro-jej. .  Endo: hx DM, was requiring 35 units of regular insulin in TPN during last admission and at [facility]. CBGs are now fairly controlled (121 - 156) with SSI + 40 units of insulin in TPN Lytes: hyponatremia prior to TPN initiation. Na currently 130. K 3.6 - MD ordered 3 runs of K. Mg 1.5 J>>2.2 after 4 gm given yesterday;  Renal: SCr stable Hepatobil: LFTs  normalized, TG WNL. Tbili 0.6. ID: Blood cx detected candida glabrata. Started on anidulafungin for fungemia. Spoke with ID and since new PICC placed after positive blood cx they are ok with continuing TPN for now. If repeat cx remains positive may have to consider line holiday. BCx NGx5 daysFinal. Afebrile, WBC wnl Best Practices: Protonix IV BID, SCDs TPN Access: central line placed 05/16/16 TPN start date: on TPN PTA  Current Nutrition:  NPO TPN at goal rate  TPN at [facility]: (information obtained on 8/30 from [place] - pharmacist at [facility]) Clinimix E 5/15 1440 ml + 20% lipids 192 ml 35 units of regular insulin ordered for each bag - per discussion there is question if this was actually being added to each bag at the facility  Nutritional Goals: per RD on 8/30 1500-1700 kCal 70-80 grams of protein per day  Plan:  Continue Clinimix E 5/15 at 42ml/hr Continue 20% lipid emulsion at 24ml/hr TPN provides 78 g of protein and 1560 kCals per day meeting 100% of patient needs MVI and TE in TPN Continue 40 units of regular insulin in TPN Continue moderate SSI and adjust as needed Monitor TPN labs  Eudelia Bunch, Pharm.D. [id number] 05/23/2016 9:39 AM

## 2016-05-24 ENCOUNTER — Inpatient Hospital Stay (HOSPITAL_COMMUNITY): Payer: Medicare Other

## 2016-05-24 LAB — COMPREHENSIVE METABOLIC PANEL
ALBUMIN: 1.7 g/dL — AB (ref 3.5–5.0)
ALK PHOS: 78 U/L (ref 38–126)
ALT: 19 U/L (ref 14–54)
AST: 27 U/L (ref 15–41)
Anion gap: 7 (ref 5–15)
BUN: 27 mg/dL — AB (ref 6–20)
CALCIUM: 8 mg/dL — AB (ref 8.9–10.3)
CO2: 23 mmol/L (ref 22–32)
CREATININE: 0.69 mg/dL (ref 0.44–1.00)
Chloride: 99 mmol/L — ABNORMAL LOW (ref 101–111)
GFR calc Af Amer: >60 mL/min (ref >60)
GFR calc non Af Amer: >60 mL/min (ref >60)
GLUCOSE: 99 mg/dL (ref 65–99)
Potassium: 4.3 mmol/L (ref 3.5–5.1)
SODIUM: 129 mmol/L — AB (ref 135–145)
Total Bilirubin: 0.6 mg/dL (ref 0.3–1.2)
Total Protein: 4.7 g/dL — ABNORMAL LOW (ref 6.5–8.1)

## 2016-05-24 LAB — CBC
HEMATOCRIT: 32.6 % — AB (ref 36.0–46.0)
Hemoglobin: 10.6 g/dL — ABNORMAL LOW (ref 12.0–15.0)
MCH: 26.7 pg (ref 26.0–34.0)
MCHC: 32.5 g/dL (ref 30.0–36.0)
MCV: 82.1 fL (ref 78.0–100.0)
Platelets: 220 10*3/uL (ref 150–400)
RBC: 3.97 MIL/uL (ref 3.87–5.11)
RDW: 15.4 % (ref 11.5–15.5)
WBC: 5.3 10*3/uL (ref 4.0–10.5)

## 2016-05-24 LAB — GLUCOSE, CAPILLARY
GLUCOSE-CAPILLARY: 101 mg/dL — AB (ref 65–99)
GLUCOSE-CAPILLARY: 111 mg/dL — AB (ref 65–99)
GLUCOSE-CAPILLARY: 98 mg/dL (ref 65–99)
Glucose-Capillary: 102 mg/dL — ABNORMAL HIGH (ref 65–99)
Glucose-Capillary: 112 mg/dL — ABNORMAL HIGH (ref 65–99)
Glucose-Capillary: 139 mg/dL — ABNORMAL HIGH (ref 65–99)
Glucose-Capillary: 125 mg/dL — ABNORMAL HIGH (ref 65–99)
Glucose-Capillary: 137 mg/dL — ABNORMAL HIGH (ref 65–99)

## 2016-05-24 LAB — PHOSPHORUS: Phosphorus: 3.7 mg/dL (ref 2.5–4.6)

## 2016-05-24 LAB — MAGNESIUM: Magnesium: 1.8 mg/dL (ref 1.7–2.4)

## 2016-05-24 MED ORDER — VITAL AF 1.2 CAL PO LIQD
1000.0000 mL | ORAL | Status: DC
  Administered 2016-05-24: 1000 mL
  Filled 2016-05-24 (×2): qty 1000

## 2016-05-24 MED ORDER — MAGNESIUM SULFATE 2 GM/50ML IV SOLN
2.0000 g | Freq: Once | INTRAVENOUS | Status: AC
  Administered 2016-05-24: 2 g via INTRAVENOUS
  Filled 2016-05-24: qty 50

## 2016-05-24 MED ORDER — SODIUM CHLORIDE 0.9% FLUSH
10.0000 mL | INTRAVENOUS | Status: DC | PRN
  Administered 2016-05-29 – 2016-06-11 (×8): 10 mL
  Filled 2016-05-24 (×8): qty 40

## 2016-05-24 MED ORDER — TRACE MINERALS CR-CU-MN-SE-ZN 10-1000-500-60 MCG/ML IV SOLN
INTRAVENOUS | Status: AC
  Administered 2016-05-24: 18:00:00 via INTRAVENOUS
  Filled 2016-05-24: qty 1560

## 2016-05-24 MED ORDER — DEXTROSE-NACL 5-0.45 % IV SOLN
INTRAVENOUS | Status: DC
  Administered 2016-05-24: 06:00:00 via INTRAVENOUS

## 2016-05-24 MED ORDER — SODIUM CHLORIDE 0.9% FLUSH
10.0000 mL | Freq: Two times a day (BID) | INTRAVENOUS | Status: DC
  Administered 2016-05-24 – 2016-06-09 (×10): 10 mL

## 2016-05-24 MED ORDER — FAT EMULSION 20 % IV EMUL
240.0000 mL | INTRAVENOUS | Status: AC
  Administered 2016-05-24: 240 mL via INTRAVENOUS
  Filled 2016-05-24: qty 250

## 2016-05-24 NOTE — Progress Notes (Signed)
PARENTERAL NUTRITION CONSULT NOTE - FOLLOW UP  Pharmacy Consult:  TPN Indication:  Gastric Outlet Obstruction  Allergies  Allergen Reactions  . Ace Inhibitors Cough  . Prednisone Other (See Comments)    LETHARGY  . Sitagliptin Other (See Comments)    Noted on [facility]   Patient Measurements: Height: 5\' 2"  (157.5 cm) Weight: 193 lb 12.6 oz (87.9 kg) IBW/kg (Calculated) : 50.1 Adjusted Body Weight: 75 kg Usual Weight: 80 kg  Vital Signs: Temp: 97.7 F (36.5 C) (09/07 0741) Temp Source: Axillary (09/07 0741) BP: 127/71 (09/07 0741) Pulse Rate: 103 (09/07 0741) Intake/Output from previous day: 09/06 0701 - 09/07 0700 In: 1750 [I.V.:1750] Out: 900 [Urine:350; Emesis/NG output:550] Intake/Output from this shift: Total I/O In: 10 [I.V.:10] Out: -   Insulin Requirements in the past 24 hours:  16 units moderate SSI + 40 units regular insulin in TPN  Assessment: [name] [name] recently admitted to [facility] with gastric outlet obstruction due to necrotic esophagitis and discharged on home Unasyn and TPN to [facility].  Admitted to [facility] 05/11/2016 with shortness of breath, fever, and confusion.  PICC line was removed on admission due to concern for line infection- found to have fungemia.   She is to continue nutrition support with TPN.  GI: GOO on chronic TPN. Albumin up to 1.7. Prealbumin up at 8.1. EGD showed severe reflux esophagitis, a gastric tumor and gastric outlet obstruction. 9/5: IR failed to get feeding tube into duodenum 2nd GOO - feeding tube removed to avoid confusion.  Bx suspicious for adenoca. CCS plans eventual pyloroplasty vs gastro-jej. Currently TPN off due to PICC placement issue .  Endo: hx DM, was requiring 35 units of regular insulin in TPN during last admission and at [facility]. CBGs up to 218 yesterday afternoon (101-218) with SSI + 40 units of insulin in TPN Lytes: hyponatremia prior to TPN initiation. Na currently 129. K 4.3 after 3 runs of K. Mg 1.8  Renal: SCr  stable Hepatobil: LFTs normalized, TG WNL. Tbili 0.6. ID: Blood cx detected candida glabrata. Started on anidulafungin for fungemia. Spoke with ID and since new PICC placed after positive blood cx they are ok with continuing TPN for now. If repeat cx remains positive may have to consider line holiday. BCx NGx5 daysFinal. Afebrile, WBC wnl Best Practices: Protonix IV BID, SCDs TPN Access: central line placed 05/16/16 TPN start date: on TPN PTA  Current Nutrition:  NPO TPN prev at goal rate -off now b/c PICC line malpositioned  TPN at [facility]: (information obtained on 8/30 from [place] - pharmacist at [facility]) Clinimix E 5/15 1440 ml + 20% lipids 192 ml 35 units of regular insulin ordered for each bag - per discussion there is question if this was actually being added to each bag at the facility  Nutritional Goals: per RD on 8/30 1500-1700 kCal 70-80 grams of protein per day  Plan: MD has ordered PICC line to be replaced Continue Clinimix E 5/15 at 52ml/hr Continue 20% lipid emulsion at 24ml/hr TPN provides 78 g of protein and 1560 kCals per day meeting 100% of patient needs MVI and TE in TPN Increase to 50 units of regular insulin in TPN Continue moderate SSI and adjust as needed Mag 2 gm bolus Monitor TPN labs  Eudelia Bunch, Pharm.D. BP:[id number] 05/24/2016 10:05 AM

## 2016-05-24 NOTE — Progress Notes (Signed)
Physical Therapy Treatment Patient Details Name: [name] [name] MRN: [id number] DOB: March 02, 1935 Today's Date: 05/24/2016    History of Present Illness 81 y.o.femalewith history of degenerative disc disease, DM, diverticulosis, GERD, hiatal hernia, HLD, HTN, and chronic hyponatremia who was admitted 04/14/2016 >04/27/2016 for treatment of acute GI bleed with gastric outlet obstruction due to necrotic esophagitis with superficial fungal infection and ulceration with subsequent development of possible right-sided pneumonitis and aspiration pneumonia. She returned to the ED c/o shortness of breath, fevers, and confusion. +sepsis (?due to PICC)    PT Comments    Patient remains motivated and very weak/deconditioned. Required seated rest after standing twice and HR up to 124 with dyspnea.   Follow Up Recommendations  SNF     Equipment Recommendations  None recommended by PT    Recommendations for Other Services OT consult     Precautions / Restrictions Precautions Precautions: Fall Precaution Comments: central IJ for TPN; peripheral IV; O2    Mobility  Bed Mobility Overal bed mobility: Needs Assistance Bed Mobility: Rolling;Sidelying to Sit Rolling: Min assist Sidelying to sit: Mod assist;HOB elevated       General bed mobility comments: Min assist to scoot to EOB with max cues for technique  Transfers Overall transfer level: Needs assistance Equipment used: Rolling walker (2 wheeled);2 person hand held assist Transfers: Sit to/from [facility] Sit to Stand: Min assist;+2 safety/equipment Stand pivot transfers: Min assist;+2 safety/equipment       General transfer comment: x2; better balance with RW: Assist to rise, stabilize, control descent. VCs safety, hand placement.  assisted from bed to [facility] to recliner  Ambulation/Gait             General Gait Details: unable due to incr HR   Stairs            Wheelchair Mobility    Modified  Rankin (Stroke Patients Only)       Balance   Sitting-balance support: No upper extremity supported;Feet unsupported Sitting balance-Leahy Scale: Fair     Standing balance support: Bilateral upper extremity supported Standing balance-Leahy Scale: Poor                      Cognition Arousal/Alertness: Awake/alert Behavior During Therapy: WFL for tasks assessed/performed Overall Cognitive Status: Within Functional Limits for tasks assessed                      Exercises General Exercises - Lower Extremity Long Arc Quad: AROM;Right;5 reps (Lt too painful) Hip ABduction/ADduction: AROM;Both;5 reps (reclined) Hip Flexion/Marching: AROM;Both;20 reps Toe Raises: AROM;Both;10 reps Heel Raises: AROM;Both;10 reps    General Comments        Pertinent Vitals/Pain Pain Assessment: No/denies pain    Home Living                      Prior Function            PT Goals (current goals can now be found in the care plan section) Acute Rehab PT Goals Patient Stated Goal: Keep my strength up. I don't like lying around in bed Time For Goal Achievement: 06/01/16 Progress towards PT goals: Progressing toward goals    Frequency  Min 2X/week    PT Plan Current plan remains appropriate    Co-evaluation             End of Session Equipment Utilized During Treatment: Gait belt Activity Tolerance: Patient limited by fatigue;Treatment limited  secondary to medical complications (Comment) (HR up to 124) Patient left: in chair;with call bell/phone within reach;with nursing/sitter in room     Time: [id number] PT Time Calculation (min) (ACUTE ONLY): 27 min  Charges:  $Therapeutic Exercise: 8-22 mins $Therapeutic Activity: 8-22 mins                    G Codes:      Yonna Alwin 06-Jun-2016, 4:47 PM Pager [phone]

## 2016-05-24 NOTE — Progress Notes (Signed)
PROGRESS NOTE    [name] [name]  [id number] DOB: 1935/06/24 DOA: 04/18/2016 PCP: [name], MD   Brief Narrative:  81 y.o.WF PMHx degenerative disc disease, DM Type 2 controlled with complications, Diverticulosis, GERD, Hiatal Hernia, HLD, HTN, and Chronic Hyponatremia   who was admitted 04/14/2016 > 04/27/2016 for treatment of acute GI bleed with gastric outlet obstruction due to necrotic esophagitis with superficial fungal infection and ulceration with subsequent development of possible right-sided pneumonitis and aspiration pneumonia. Patient was discharged on Unasyn and Diflucan and completed her course on 05/14/2016.  A Left arm PICC was placed for home Unasyn and TPN. She returned to the ED c/o shortness of breath and fevers. In the ED she was noted to be confused.     Subjective: 9/7 A/O  4 Follows commands. NAD   Assessment & Plan:   Principal Problem:   Fungemia Active Problems:   DM2 (diabetes mellitus, type 2) ([place])   Gastric outlet obstruction   Esophageal necrosis   Sepsis ([place])   Acute respiratory failure ([place])   Acute encephalopathy   On total parenteral nutrition (TPN)   Normocytic anemia   Essential hypertension   Erosive esophagitis   Candida glabrata infection   Gastric mass   Gastric adenocarcinoma (HCC)   Sepsis unspecified organism /Fungemia (positive Candida glabrata) -febrile at nursing home to 104.5  - UA unremarkable  - chest x-ray improved compared to recent  -Continue per ID Anidulafungin   Acute hypoxic respiratory failure / AMS -Resolved  Acute GI bleed / esophageal necrosis / gastric outlet obstruction with bowel ulceration -EGD: Gastric tumor, esophagitis E results below -8/31 biopsy: See results below   Gastric tumor Stomach/pyloric channel mass- SUSPICIOUS FOR ADENOCARCINOMA -8/31 biopsy tumor pending -CCS recommendations: Stated most likely will not perform resection until next week after nutritional status  improved. -After speaking with [facility] today another EGD would not change plan. Patient needs resection of affected area. -Continue TPN. NG tube NOT PLACED Postpyloric by IR  -Start trickle feeds through NG tube if patient tolerates advance slowly. If patient does not tolerate on 9/8 Reconsult GI can advance the NG tube postpyloric via EGD?  Chronic Hyponatremia -Appears stable  Recent Labs Lab 05/20/16 0545 05/21/16 0240 05/22/16 0500 05/23/16 0500 05/24/16 0603  NA 136 134* 133* 130* 129*   Hypokalemia -Resolved  Hypomagnesemia -Magnesium IV 4 gm  Normocytic anemia(Baseline Hg ~9.0)  Recent Labs Lab 05/20/16 0545 05/21/16 0240 05/22/16 0500 05/24/16 0603  HGB 6.2* 10.5* 10.8* 10.6*   HTN -Improved -Hydralazine 5 mg QID -Hydralazine PRN -Echocardiogram: Essentially normal see results below   DM type 2 controlled without complication -[id number] Hemoglobin A1c = 6.6 -Moderate SSI     DVT prophylaxis: SCD Code Status: DO NOT RESUSCITATE Family Communication: None Disposition Plan: Await CCS recommendation   Consultants:  Dr.Kavitha V Nandigam GI Dr. Ralene Ok [facility]    Procedures/Significant Events:  8/31 EGD:- Severe reflux esophagitis. Biopsied.- Gastric tumor at the pylorus and in the prepyloric region of the stomach.  Biopsied.- Gastric outlet obstruction 8/31 Biopsy Stomach/pyloric channel mass- SUSPICIOUS FOR ADENOCARCINOMA. - Esophagus, biopsy:- EXUDATE WITH FOCAL NECROSIS C/W ULCER. Negative malignancy 9/1 Echocardiogram:- Left ventricle: mild focal basal hypertrophy of the septum. -LVEF =60% to 65%. 9/5 placement postpyloric NG tube                         Cultures 8/29 blood left hand /right AC positive Candida glabrata 8/29 urine negative 8/29  MRSA by PCR negative 8/31 blood left/right hand negative Final   Antimicrobials: Anidulafungin 8/31>>   Devices    LINES / TUBES:  PICC line 9/7>>    Continuous Infusions: .  dextrose 5 % and 0.45% NaCl 50 mL/hr at 05/24/16 0537  . Marland KitchenTPN (CLINIMIX-E) Adult 65 mL/hr at 05/23/16 1805   And  . fat emulsion 240 mL (05/23/16 1800)     Objective: Vitals:   05/24/16 0400 05/24/16 0500 05/24/16 0535 05/24/16 0741  BP: (!) 137/58  (!) 137/58 127/71  Pulse: 89   (!) 103  Resp: (!) 21   18  Temp: 97.8 F (36.6 C)   97.7 F (36.5 C)  TempSrc: Oral   Axillary  SpO2: 97%   98%  Weight:  87.9 kg (193 lb 12.6 oz)    Height:        Intake/Output Summary (Last 24 hours) at 05/24/16 0901 Last data filed at 05/24/16 [id number]  Gross per 24 hour  Intake             1750 ml  Output              550 ml  Net             1200 ml   Filed Weights   05/23/16 0105 05/23/16 0508 05/24/16 0500  Weight: 87 kg (191 lb 12.8 oz) 85.9 kg (189 lb 6 oz) 87.9 kg (193 lb 12.6 oz)    Examination:  General: A/O 4, No acute respiratory distress Eyes: negative scleral hemorrhage, negative anisocoria, negative icterus ENT: Negative Runny nose, negative gingival bleeding, Neck:  Negative scars, masses, torticollis, lymphadenopathy, JVD Lungs: Clear to auscultation bilaterally without wheezes or crackles Cardiovascular: Regular rate and rhythm without murmur gallop or rub normal S1 and S2 Abdomen: negative abdominal pain, nondistended, positive soft, bowel sounds, no rebound, no ascites, no appreciable mass Extremities: No significant cyanosis, clubbing, or edema bilateral lower extremities Skin: Negative rashes, lesions, ulcers Psychiatric:  Negative depression, negative anxiety, negative fatigue, negative mania  Central nervous system:  Cranial nerves II through XII intact, tongue/uvula midline, all extremities muscle strength 5/5, sensation intact throughout,negative dysarthria, negative expressive aphasia, negative receptive aphasia.  .     Data Reviewed: Care during the described time interval was provided by me .  I have reviewed this patient's available data, including medical  history, events of note, physical examination, and all test results as part of my evaluation. I have personally reviewed and interpreted all radiology studies.  CBC:  Recent Labs Lab 05/20/16 0545 05/21/16 0240 05/22/16 0500 05/24/16 0603  WBC 3.7* 4.7 5.5 5.3  NEUTROABS  --  2.6  --   --   HGB 6.2* 10.5* 10.8* 10.6*  HCT 20.2* 32.7* 33.7* 32.6*  MCV 86.0 83.2 82.4 82.1  PLT 140* 182 196 [id number]   Basic Metabolic Panel:  Recent Labs Lab 05/18/16 0316  05/20/16 0545 05/21/16 0240 05/22/16 0500 05/23/16 0500 05/24/16 0603  NA 133*  < > 136 134* 133* 130* 129*  K 3.5  < > 3.4* 4.2 3.7 3.6 4.3  CL 101  < > 111 105 101 95* 99*  CO2 25  < > 22 25 24 23 23   GLUCOSE 154*  < > 143* 109* 113* 149* 99  BUN 20  < > 13 14 16  24* 27*  CREATININE 0.69  < > 0.51 0.68 0.68 0.71 0.69  CALCIUM 7.2*  < > 6.6* 7.8* 8.1* 7.7* 8.0*  MG 2.1  --   --  1.4* 1.5* 2.2 1.8  PHOS 2.5  --   --  3.3  --   --  3.7  < > = values in this interval not displayed. GFR: Estimated Creatinine Clearance: 59.7 mL/min (by C-G formula based on SCr of 0.8 mg/dL). Liver Function Tests:  Recent Labs Lab 05/21/16 0240 05/24/16 0603  AST 27 27  ALT 22 19  ALKPHOS 74 78  BILITOT 0.6 0.6  PROT 4.6* 4.7*  ALBUMIN 1.7* 1.7*   No results for input(s): LIPASE, AMYLASE in the last 168 hours. No results for input(s): AMMONIA in the last 168 hours. Coagulation Profile:  Recent Labs Lab 05/22/16 0500  INR 1.22   Cardiac Enzymes: No results for input(s): CKTOTAL, CKMB, CKMBINDEX, TROPONINI in the last 168 hours. BNP (last 3 results) No results for input(s): PROBNP in the last 8760 hours. HbA1C: No results for input(s): HGBA1C in the last 72 hours. CBG:  Recent Labs Lab 05/24/16 0024 05/24/16 0348 05/24/16 0606 05/24/16 0703 05/24/16 0740  GLUCAP 98 102* 101* 111* 112*   Lipid Profile: No results for input(s): CHOL, HDL, LDLCALC, TRIG, CHOLHDL, LDLDIRECT in the last 72 hours. Thyroid Function  Tests: No results for input(s): TSH, T4TOTAL, FREET4, T3FREE, THYROIDAB in the last 72 hours. Anemia Panel: No results for input(s): VITAMINB12, FOLATE, FERRITIN, TIBC, IRON, RETICCTPCT in the last 72 hours. Urine analysis:    Component Value Date/Time   COLORURINE AMBER (A) 05/08/2016 1300   APPEARANCEUR CLEAR 04/20/2016 1300   LABSPEC 1.029 04/17/2016 1300   PHURINE 6.5 04/22/2016 1300   GLUCOSEU 100 (A) 05/02/2016 1300   HGBUR SMALL (A) 05/14/2016 1300   BILIRUBINUR SMALL (A) 05/14/2016 1300   KETONESUR NEGATIVE 04/29/2016 1300   PROTEINUR 100 (A) 04/19/2016 1300   NITRITE NEGATIVE 05/16/2016 1300   LEUKOCYTESUR NEGATIVE 05/16/2016 1300   Sepsis Labs: @LABRCNTIP (procalcitonin:4,lacticidven:4)  ) Recent Results (from the past 240 hour(s))  Blood Culture (routine x 2)     Status: Abnormal (Preliminary result)   Collection Time: 05/03/2016 11:00 AM  Result Value Ref Range Status   Specimen Description BLOOD LEFT HAND  Final   Special Requests BOTTLES DRAWN AEROBIC AND ANAEROBIC  5CC  Final   Culture  Setup Time   Final    YEAST AEROBIC BOTTLE ONLY CRITICAL RESULT CALLED TO, READ BACK BY AND VERIFIED WITH: Colin Rhein, PHARM AT 1812 ON [id number] BY S. YARBROUGH    Culture (A)  Final    CANDIDA GLABRATA WILL REFER TO [facility] FOR ANTIFUNGAL SUSCEPTIBILITIES PER PHYSICIAN REQUEST    Report Status PENDING  Incomplete  Blood Culture (routine x 2)     Status: Abnormal   Collection Time: 05/14/2016 11:13 AM  Result Value Ref Range Status   Specimen Description BLOOD RIGHT ANTECUBITAL  Final   Special Requests BOTTLES DRAWN AEROBIC AND ANAEROBIC  5CC  Final   Culture  Setup Time (A)  Final    YEAST AEROBIC BOTTLE ONLY CRITICAL RESULT CALLED TO, READ BACK BY AND VERIFIED WITH: Colin Rhein, PHARM AT 1706 ON [id number] BY S. YARBROUGH    Culture CANDIDA GLABRATA (A)  Final   Report Status 05/19/2016 FINAL  Final  Blood Culture ID Panel (Reflexed)     Status: Abnormal   Collection Time: 05/03/2016  11:13 AM  Result Value Ref Range Status   Enterococcus species NOT DETECTED NOT DETECTED Final   Listeria monocytogenes NOT DETECTED NOT DETECTED Final   Staphylococcus species NOT DETECTED NOT DETECTED Final   Staphylococcus aureus NOT DETECTED  NOT DETECTED Final   Streptococcus species NOT DETECTED NOT DETECTED Final   Streptococcus agalactiae NOT DETECTED NOT DETECTED Final   Streptococcus pneumoniae NOT DETECTED NOT DETECTED Final   Streptococcus pyogenes NOT DETECTED NOT DETECTED Final   Acinetobacter baumannii NOT DETECTED NOT DETECTED Final   Enterobacteriaceae species NOT DETECTED NOT DETECTED Final   Enterobacter cloacae complex NOT DETECTED NOT DETECTED Final   Escherichia coli NOT DETECTED NOT DETECTED Final   Klebsiella oxytoca NOT DETECTED NOT DETECTED Final   Klebsiella pneumoniae NOT DETECTED NOT DETECTED Final   Proteus species NOT DETECTED NOT DETECTED Final   Serratia marcescens NOT DETECTED NOT DETECTED Final   Haemophilus influenzae NOT DETECTED NOT DETECTED Final   Neisseria meningitidis NOT DETECTED NOT DETECTED Final   Pseudomonas aeruginosa NOT DETECTED NOT DETECTED Final   Candida albicans NOT DETECTED NOT DETECTED Final   Candida glabrata DETECTED (A) NOT DETECTED Final    Comment: CRITICAL RESULT CALLED TO, READ BACK BY AND VERIFIED WITH: Michele Mcalpine AT 1706 ON [id number] BY S. YARBROUGH    Candida krusei NOT DETECTED NOT DETECTED Final   Candida parapsilosis NOT DETECTED NOT DETECTED Final   Candida tropicalis NOT DETECTED NOT DETECTED Final  Urine culture     Status: None   Collection Time: 05/13/2016  1:00 PM  Result Value Ref Range Status   Specimen Description URINE, CATHETERIZED  Final   Special Requests NONE  Final   Culture NO GROWTH  Final   Report Status 05/16/2016 FINAL  Final  MRSA PCR Screening     Status: None   Collection Time: 05/03/2016  6:54 PM  Result Value Ref Range Status   MRSA by PCR NEGATIVE NEGATIVE Final    Comment:        The  GeneXpert MRSA Assay (FDA approved for NASAL specimens only), is one component of a comprehensive MRSA colonization surveillance program. It is not intended to diagnose MRSA infection nor to guide or monitor treatment for MRSA infections.   Culture, blood (routine x 2)     Status: None   Collection Time: 05/01/2016  6:19 PM  Result Value Ref Range Status   Specimen Description BLOOD LEFT HAND  Final   Special Requests IN PEDIATRIC BOTTLE 1.5CC  Final   Culture NO GROWTH 5 DAYS  Final   Report Status 05/22/2016 FINAL  Final  Culture, blood (routine x 2)     Status: None   Collection Time: 05/05/2016  6:36 PM  Result Value Ref Range Status   Specimen Description BLOOD RIGHT HAND  Final   Special Requests IN PEDIATRIC BOTTLE [facility]  Final   Culture NO GROWTH 5 DAYS  Final   Report Status 05/22/2016 FINAL  Final         Radiology Studies: Dg Chest Port 1 View  Result Date: 05/24/2016 CLINICAL DATA:  Central line placement.  Initial encounter. EXAM: PORTABLE CHEST 1 VIEW COMPARISON:  Chest radiograph performed 04/17/2016 FINDINGS: Enteric tubes are noted extending below the diaphragm. A left IJ line is noted ending overlying the left brachiocephalic vein. Small bilateral pleural effusions noted, right greater than left. Bibasilar airspace opacities raise concern for pulmonary edema. No pneumothorax is seen. The cardiomediastinal silhouette is mildly enlarged. No acute osseous abnormalities are seen. IMPRESSION: 1. Left IJ line noted ending overlying the left brachiocephalic vein. 2. Small bilateral pleural effusions, right greater than left. Bibasilar airspace opacities raise concern for pulmonary edema. Mild cardiomegaly. Electronically Signed   By: Francoise Schaumann.D.  On: 05/24/2016 01:54   Dg Addison Bailey G Tube Plc W/fl W/rad  Result Date: 05/22/2016 CLINICAL DATA:  Replacement of NG tube and placement of a feeding tube. EXAM: NASO G TUBE PLACEMENT WITH FL AND WITH RAD CONTRAST:  20 cc  Gastrografin FLUOROSCOPY TIME:  Fluoroscopy Time:  20 minutes Radiation Exposure Index (if provided by the fluoroscopic device): 5341.6 mGy Number of Acquired Spot Images: 0 COMPARISON:  CT scan 04/17/2016 FINDINGS: The radiology technologist Baxter Flattery Dingus). Remove the NG tube which was coronal back on itself in the esophagus and replace and NG tube into the stomach. She also advanced a feeding tube into the stomach but could not advance it into the duodenum. I could not advance it into the duodenum either. IMPRESSION: NG tube and feeding tubes in the stomach. The feeding tube could not be advanced into the duodenum. Electronically Signed   By: Marijo Sanes M.D.   On: 05/22/2016 11:39        Scheduled Meds: . anidulafungin  100 mg Intravenous Q24H  . antiseptic oral rinse  7 mL Mouth Rinse q12n4p  . chlorhexidine  15 mL Mouth Rinse BID  . hydrALAZINE  5 mg Intravenous Q6H  . insulin aspart  0-15 Units Subcutaneous Q4H  . magnesium sulfate 1 - 4 g bolus IVPB  2 g Intravenous Once  . pantoprazole  40 mg Intravenous Q12H  . sodium chloride flush  3 mL Intravenous Q12H   Continuous Infusions: . dextrose 5 % and 0.45% NaCl 50 mL/hr at 05/24/16 0537  . Marland KitchenTPN (CLINIMIX-E) Adult 65 mL/hr at 05/23/16 1805   And  . fat emulsion 240 mL (05/23/16 1800)     LOS: 9 days    Time spent: 40 minutes    WOODS, Geraldo Docker, MD [facility] Pager [phone]   If 7PM-7AM, please contact night-coverage [email or web address] Password [email or web address] 05/24/2016, 9:01 AM

## 2016-05-24 NOTE — Progress Notes (Signed)
Dr Sherral Hammers ordered NGT Rt Nare  to be clamped and restarted TF Vital 1.2 @ 20 cc/hr per [facility] Via Lt nares  medicated with Zofran just prior to restarting feedings. HOB @ 30 degrees

## 2016-05-24 NOTE — Progress Notes (Signed)
Called to assess Central line for poor flow and poor blood return not amenable to alteplase on 9-6. Upon looking at previous radiology studies, line tip appears to have been retracted from original placement. Chris RN notified to contact MD regarding PCXR for tip location and possible replacment of central line.

## 2016-05-24 NOTE — Care Management Important Message (Signed)
Important Message  Patient Details  Name: MEHAK FINKLEA MRN: [id number] Date of Birth: 1935-02-24   Medicare Important Message Given:  Yes    Zoila Ditullio Abena 05/24/2016, 10:28 AM

## 2016-05-24 NOTE — Progress Notes (Addendum)
CXR shows tip of L central line in L brachiocephalic vein. Primary RN Laural Golden notified and instructed to call for order to DC TPN to line and replace line.

## 2016-05-24 NOTE — Progress Notes (Signed)
Patient ID: [name] [name], female   DOB: 30-Apr-1935, 80 y.o.   MRN: [id number]  Patient currently having issues with central access for TNA Unable to place post-pyloric feeding tube Poor nutritional status  The surgical plan eventually is a gastrojejunal bypass, not a gastrectomy.  If more tissue is needed for biopsy, this needs to be obtained by GI via EGD.  Perhaps GI can try to get a feeding tube past the obstruction.  She will not have surgery until her nutritional status is improved.  She is high-risk for perioperative complications.  Will recheck next week.  [name] [name]. Georgette Dover, MD, [facility]  General/ Trauma Surgery  05/24/2016 8:48 AM

## 2016-05-25 LAB — GLUCOSE, CAPILLARY
GLUCOSE-CAPILLARY: 121 mg/dL — AB (ref 65–99)
GLUCOSE-CAPILLARY: 122 mg/dL — AB (ref 65–99)
GLUCOSE-CAPILLARY: 144 mg/dL — AB (ref 65–99)
GLUCOSE-CAPILLARY: 83 mg/dL (ref 65–99)
GLUCOSE-CAPILLARY: 102 mg/dL — AB (ref 65–99)
GLUCOSE-CAPILLARY: 90 mg/dL (ref 65–99)
Glucose-Capillary: 115 mg/dL — ABNORMAL HIGH (ref 65–99)

## 2016-05-25 LAB — MISC LABCORP TEST (SEND OUT): Labcorp test code: 183483

## 2016-05-25 LAB — BASIC METABOLIC PANEL
Anion gap: 7 (ref 5–15)
BUN: 28 mg/dL — AB (ref 6–20)
CHLORIDE: 100 mmol/L — AB (ref 101–111)
CO2: 21 mmol/L — AB (ref 22–32)
CREATININE: 0.76 mg/dL (ref 0.44–1.00)
Calcium: 8 mg/dL — ABNORMAL LOW (ref 8.9–10.3)
GFR calc Af Amer: >60 mL/min (ref >60)
GFR calc non Af Amer: >60 mL/min (ref >60)
Glucose, Bld: 107 mg/dL — ABNORMAL HIGH (ref 65–99)
POTASSIUM: 5.1 mmol/L (ref 3.5–5.1)
Sodium: 128 mmol/L — ABNORMAL LOW (ref 135–145)

## 2016-05-25 LAB — MAGNESIUM: MAGNESIUM: 2.1 mg/dL (ref 1.7–2.4)

## 2016-05-25 LAB — SODIUM
SODIUM: 128 mmol/L — AB (ref 135–145)
Sodium: 128 mmol/L — ABNORMAL LOW (ref 135–145)

## 2016-05-25 MED ORDER — FAT EMULSION 20 % IV EMUL
240.0000 mL | INTRAVENOUS | Status: AC
  Administered 2016-05-25: 240 mL via INTRAVENOUS
  Filled 2016-05-25: qty 250

## 2016-05-25 MED ORDER — TRACE MINERALS CR-CU-MN-SE-ZN 10-1000-500-60 MCG/ML IV SOLN
INTRAVENOUS | Status: AC
  Administered 2016-05-25: 18:00:00 via INTRAVENOUS
  Filled 2016-05-25: qty 1560

## 2016-05-25 MED ORDER — MENTHOL 3 MG MT LOZG: 1.0000 | LOZENGE | OROMUCOSAL | Status: DC | PRN

## 2016-05-25 MED ORDER — PHENOL 1.4 % MT LIQD
1.0000 | Freq: Two times a day (BID) | OROMUCOSAL | Status: DC
  Administered 2016-05-25 – 2016-06-13 (×30): 1 via OROMUCOSAL
  Filled 2016-05-25 (×7): qty 177

## 2016-05-25 NOTE — Care Management Important Message (Signed)
Important Message  Patient Details  Name: [name] [name] MRN: [id number] Date of Birth: May 13, 1935   Medicare Important Message Given:  Yes    Liannah Yarbough Abena 05/25/2016, 11:16 AM

## 2016-05-25 NOTE — Progress Notes (Signed)
CSW will continue to follow for pt return to [facility] when medically stable  Jorge Ny, [place] Social Worker [phone]

## 2016-05-25 NOTE — Progress Notes (Signed)
Patient ID: [name] [name], female   DOB: 10/25/1934, 81 y.o.   MRN: [id number] The feeding tube is not post-pyloric, since IR could not get it past the obstruction at the pylorus.  Tube feeds will not be helpful, since it is unlikely that the feeds will exit the stomach.  Would stop tube feeds and continue TNA.  If GI decides to scope her again to try to obtain more tissue for biopsy, they might be able to thread a feeding tube past the obstruction, then she can resume tube feeds.  Her nutritional status needs to be improved prior to surgery.  Will recheck Monday.  Imogene Burn. Georgette Dover, MD, [facility] Surgery  General/ Trauma Surgery  05/25/2016 8:03 AM

## 2016-05-25 NOTE — Progress Notes (Signed)
[facility] Progress Note  Patient: [name] [name] [id number]   PCP: [name], MD DOB: [birth year]   DOA: 04/30/2016   DOS: 05/25/2016   Date of Service: the patient was seen and examined on 05/25/2016  Subjective: Patient denies any acute complaint. Complains about irritation from the tube. No abdominal pain. Nutrition: Nothing by mouth.  Brief hospital course: Pt. with PMH of type II DM, GERD, diverticulosis, HTN; admitted on 05/01/2016, with complaint of abdominal pain, shortness of breath and fever, was found to have fungemia. 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 Currently further plan is improvement in nutritional status.  Assessment and Plan: Sepsis unspecified organism /Fungemia (positive Candida glabrata) -febrile at nursing home to 104.5  - UA unremarkable  - chest x-ray improved compared to recent  -Continue per ID Anidulafungin, total duration 14 days.  Acute hypoxic respiratory failure / AMS -Resolved  Acute GI bleed / esophageal necrosis / gastric outlet obstruction with bowel ulceration -EGD: Gastric tumor, esophagitis E results below -8/31 biopsy: See results below   Gastric tumor Stomach/pyloric channel mass- SUSPICIOUS FOR ADENOCARCINOMA -CCS recommendations: Stated most likely will not perform resection until next week after nutritional status improved. Continue TPN -After speaking with [facility] today another EGD would not change plan. Patient needs resection of affected area. -Remove Postpyloric by IR   Hyponatremia -Continue to monitor  Hypokalemia -Resolved  Hypomagnesemia  monitor  Normocytic anemia(Baseline Hg ~9.0) Artery stable, continue to monitor  HTN -Improved -Hydralazine 5 mg QID -Hydralazine PRN -Echocardiogram: Essentially normal  DM type 2 controlled without complication -[id number] Hemoglobin A1c = 6.6 -Moderate SSI   Pain  management: When necessary Tylenol Activity: SNF as per physical therapy Bowel regimen: last BM in 04/05/2016 Diet: Nothing by mouth, continue TPN feeding DVT Prophylaxis: subcutaneous Heparin  Advance goals of care discussion: DNR/DNI  Family Communication: no family was present at bedside, at the time of interview.  Disposition:  Discharge to SNF.  Consultants: Gen. surgery, gastroenterology Procedures: TPN  Antibiotics: Anti-infectives    Start     Dose/Rate Route Frequency Ordered Stop   05/18/16 1700  anidulafungin ([name]) 100 mg in sodium chloride 0.9 % 100 mL IVPB     100 mg over 90 Minutes Intravenous Every 24 hours 05/08/2016 1627     04/20/2016 1700  anidulafungin ([name]) 200 mg in sodium chloride 0.9 % 200 mL IVPB     200 mg over 180 Minutes Intravenous  Once 04/21/2016 1627 05/10/2016 2003   05/16/16 0200  vancomycin (VANCOCIN) IVPB 750 mg/150 ml premix  Status:  Discontinued     750 mg 150 mL/hr over 60 Minutes Intravenous Every 12 hours 05/04/2016 1409 05/16/16 1045   05/17/2016 1400  fluconazole (DIFLUCAN) IVPB 100 mg  Status:  Discontinued     100 mg 50 mL/hr over 60 Minutes Intravenous Every 24 hours 05/10/2016 1317 04/25/2016 1653   05/17/2016 1400  vancomycin (VANCOCIN) IVPB 1000 mg/200 mL premix  Status:  Discontinued     1,000 mg 200 mL/hr over 60 Minutes Intravenous  Once 04/20/2016 1353 05/17/2016 1407   05/07/2016 1330  vancomycin (VANCOCIN) IVPB 1000 mg/200 mL premix     1,000 mg 200 mL/hr over 60 Minutes Intravenous  Once 04/25/2016 1325 05/10/2016 1525   04/24/2016 1300  ceFEPIme (MAXIPIME) 2 g in dextrose 5 % 50 mL IVPB  Status:  Discontinued     2 g 100 mL/hr over  30 Minutes Intravenous Every 12 hours 05/03/2016 1257 05/16/16 1043        Intake/Output Summary (Last 24 hours) at 05/25/16 2019 Last data filed at 05/25/16 2000  Gross per 24 hour  Intake             1195 ml  Output              250 ml  Net              945 ml   Filed Weights   05/23/16 0508 05/24/16 0500  05/25/16 0555  Weight: 85.9 kg (189 lb 6 oz) 87.9 kg (193 lb 12.6 oz) 87.6 kg (193 lb 2 oz)    Objective: Physical Exam: Vitals:   05/25/16 1400 05/25/16 1543 05/25/16 2000 05/25/16 2010  BP: 126/60 (!) 138/52 (!) 128/52   Pulse: 94     Resp: (!) 25  18   Temp:  99.1 F (37.3 C)  99.1 F (37.3 C)  TempSrc:  Axillary  Axillary  SpO2: 100%  93%   Weight:      Height:        General: Alert, Awake and Oriented to Time, Place and Person. Appear in mild distress Eyes: PERRL, Conjunctiva normal ENT: Oral Mucosa clear moist. Neck: difficult to assess JVD, no Abnormal Mass Or lumps Cardiovascular: S1 and S2 Present, aortic systolic Murmur, Respiratory: Bilateral Air entry equal and Decreased, Clear to Auscultation, no Crackles, no wheezes Abdomen: Bowel Sound present, Soft and no tenderness Skin: no redness, no Rash  Extremities: no Pedal edema, no calf tenderness Neurologic: Grossly no focal neuro deficit. Bilaterally Equal motor strength  Data Reviewed: CBC:  Recent Labs Lab 05/20/16 0545 05/21/16 0240 05/22/16 0500 05/24/16 0603  WBC 3.7* 4.7 5.5 5.3  NEUTROABS  --  2.6  --   --   HGB 6.2* 10.5* 10.8* 10.6*  HCT 20.2* 32.7* 33.7* 32.6*  MCV 86.0 83.2 82.4 82.1  PLT 140* 182 196 [id number]   Basic Metabolic Panel:  Recent Labs Lab 05/21/16 0240 05/22/16 0500 05/23/16 0500 05/24/16 0603 05/25/16 0214  NA 134* 133* 130* 129* 128*  K 4.2 3.7 3.6 4.3 5.1  CL 105 101 95* 99* 100*  CO2 25 24 23 23  21*  GLUCOSE 109* 113* 149* 99 107*  BUN 14 16 24* 27* 28*  CREATININE 0.68 0.68 0.71 0.69 0.76  CALCIUM 7.8* 8.1* 7.7* 8.0* 8.0*  MG 1.4* 1.5* 2.2 1.8 2.1  PHOS 3.3  --   --  3.7  --     Liver Function Tests:  Recent Labs Lab 05/21/16 0240 05/24/16 0603  AST 27 27  ALT 22 19  ALKPHOS 74 78  BILITOT 0.6 0.6  PROT 4.6* 4.7*  ALBUMIN 1.7* 1.7*   No results for input(s): LIPASE, AMYLASE in the last 168 hours. No results for input(s): AMMONIA in the last 168  hours. Coagulation Profile:  Recent Labs Lab 05/22/16 0500  INR 1.22   Cardiac Enzymes: No results for input(s): CKTOTAL, CKMB, CKMBINDEX, TROPONINI in the last 168 hours. BNP (last 3 results) No results for input(s): PROBNP in the last 8760 hours.  CBG:  Recent Labs Lab 05/25/16 0402 05/25/16 0823 05/25/16 1144 05/25/16 1706 05/25/16 2011  GLUCAP 121* 122* 144* 83 102*    Studies: No results found.   Scheduled Meds: . anidulafungin  100 mg Intravenous Q24H  . antiseptic oral rinse  7 mL Mouth Rinse q12n4p  . chlorhexidine  15 mL Mouth Rinse BID  .  hydrALAZINE  5 mg Intravenous Q6H  . insulin aspart  0-15 Units Subcutaneous Q4H  . pantoprazole  40 mg Intravenous Q12H  . phenol  1 spray Mouth/Throat BID  . sodium chloride flush  10-40 mL Intracatheter Q12H  . sodium chloride flush  3 mL Intravenous Q12H   Continuous Infusions: . Marland KitchenTPN (CLINIMIX-E) Adult 65 mL/hr at 05/25/16 1747   And  . fat emulsion 240 mL (05/25/16 1746)   PRN Meds: acetaminophen, albuterol, hydrALAZINE, menthol-cetylpyridinium, ondansetron, phenol, sodium chloride flush, sodium chloride flush  Time spent: 30 minutes  Author: Berle Mull, MD [facility] Pager: [phone] 05/25/2016 8:19 PM  If 7PM-7AM, please contact night-coverage at [email or web address], password [facility]

## 2016-05-25 NOTE — Progress Notes (Signed)
PARENTERAL NUTRITION CONSULT NOTE - FOLLOW UP  Pharmacy Consult:  TPN Indication:  Gastric Outlet Obstruction  Allergies  Allergen Reactions  . Ace Inhibitors Cough  . Prednisone Other (See Comments)    LETHARGY  . Sitagliptin Other (See Comments)    Noted on [facility]   Patient Measurements: Height: 5\' 2"  (157.5 cm) Weight: 193 lb 2 oz (87.6 kg) IBW/kg (Calculated) : 50.1 Adjusted Body Weight: 75 kg Usual Weight: 80 kg  Vital Signs: Temp: 97.8 F (36.6 C) (09/08 0717) Temp Source: Oral (09/08 0717) BP: 136/60 (09/08 0717) Pulse Rate: 96 (09/08 0717) Intake/Output from previous day: 09/07 0701 - 09/08 0700 In: 1472.4 [I.V.:1172.4; NG/GT:170; IV Piggyback:130] Out: 220 [Urine:220] Intake/Output from this shift: No intake/output data recorded.    Insulin Requirements in the past 24 hours:  6 units moderate SSI + 50 units regular insulin in TPN  Assessment: 30 YOF recently admitted to [facility] with gastric outlet obstruction due to necrotic esophagitis and discharged on home Unasyn and TPN to [facility].  Admitted to [facility] 04/22/2016 with shortness of breath, fever, and confusion.  PICC line was removed on admission due to concern for line infection- found to have fungemia.   She is to continue nutrition support with TPN.  TF was initiated and now discontinued because it is unlikely to exit the stomach per Surgery.  Plan to resume TF if post-pyloric feeding tube is able to be placed.  GI: GOO on chronic TPN.  Prealbumin 3.5 > 8.1.  Bx suspicious for adenocarcinoma; CCS plans eventual pyloroplasty vs GJ.  IR unable to place FT on 9/5 d/t obstruction. Endo: hx DM - CBGs currently controlled Lytes: hyponatremia prior to TPN initiation.  Low Na/CL/CO2 128/100/21, others WNL Cards: VSS - IV hydralazine Renal: SCr stable, BUN 28 - low UOP 0.1 ml/kg/hr Hepatobil: LFTs normalized, TG WNL. Tbili 0.6. ID: [name] D#9 for C.glabrata. +BCx after PICC placed, repeat BCx negative.   Afebrile Best Practices: Protonix IV BID, SCDs TPN Access: PICC replaced 05/24/16 TPN start date: on TPN PTA  TPN at [facility]: (information obtained on 8/30 from [place] - pharmacist at [place]) Clinimix E 5/15 1440 ml + 20% lipids 192 ml 35 units of regular insulin ordered for each bag - per discussion there is question if this was actually being added to each bag at the facility  Current Nutrition:  TPN  Nutritional Goals: 1500-1700 kCal and 70-80 grams of protein per day   Plan: - Continue Clinimix E 5/15 at 65 ml/hr and lipids at 10 ml/hr.  TPN provides 1588 kCal and 78 gm of protein per day, meeting 100% of patient's needs. - Daily multivitamin and trace elements in TPN - Continue moderate SSI + 50 units regular insulin in TPN - F/U ability to place feeding tube and resume TF - F/U [name] LOT - Consider changing IV Protonix to Pepcid   Icis Budreau D. Mina Marble, PharmD, BCPS Pager:  [phone] 05/25/2016, 8:38 AM

## 2016-05-25 NOTE — Progress Notes (Signed)
Afebrile.  Lab stable.  Spoke with lab re: C glabrata sensi: MIC 0.12 for anidulafungin Other sensi pending Will f/u on 9-11

## 2016-05-26 LAB — GLUCOSE, CAPILLARY
GLUCOSE-CAPILLARY: 91 mg/dL (ref 65–99)
GLUCOSE-CAPILLARY: 76 mg/dL (ref 65–99)
GLUCOSE-CAPILLARY: 96 mg/dL (ref 65–99)
Glucose-Capillary: 82 mg/dL (ref 65–99)
Glucose-Capillary: 83 mg/dL (ref 65–99)
Glucose-Capillary: 83 mg/dL (ref 65–99)

## 2016-05-26 LAB — CULTURE, BLOOD (ROUTINE X 2)

## 2016-05-26 LAB — CBC WITH DIFFERENTIAL/PLATELET
BASOS PCT: 1 %
Basophils Absolute: 0 10*3/uL (ref 0.0–0.1)
EOS ABS: 0.2 10*3/uL (ref 0.0–0.7)
EOS PCT: 3 %
HCT: 31.8 % — ABNORMAL LOW (ref 36.0–46.0)
Hemoglobin: 10.3 g/dL — ABNORMAL LOW (ref 12.0–15.0)
LYMPHS ABS: 1.2 10*3/uL (ref 0.7–4.0)
Lymphocytes Relative: 22 %
MCH: 26.9 pg (ref 26.0–34.0)
MCHC: 32.4 g/dL (ref 30.0–36.0)
MCV: 83 fL (ref 78.0–100.0)
MONOS PCT: 15 %
Monocytes Absolute: 0.9 10*3/uL (ref 0.1–1.0)
Neutro Abs: 3.4 10*3/uL (ref 1.7–7.7)
Neutrophils Relative %: 59 %
PLATELETS: 241 10*3/uL (ref 150–400)
RBC: 3.83 MIL/uL — AB (ref 3.87–5.11)
RDW: 15.4 % (ref 11.5–15.5)
WBC: 5.7 10*3/uL (ref 4.0–10.5)

## 2016-05-26 LAB — BASIC METABOLIC PANEL
Anion gap: 5 (ref 5–15)
BUN: 31 mg/dL — ABNORMAL HIGH (ref 6–20)
CALCIUM: 8.2 mg/dL — AB (ref 8.9–10.3)
CO2: 24 mmol/L (ref 22–32)
CREATININE: 0.72 mg/dL (ref 0.44–1.00)
Chloride: 99 mmol/L — ABNORMAL LOW (ref 101–111)
Glucose, Bld: 122 mg/dL — ABNORMAL HIGH (ref 65–99)
Potassium: 4.5 mmol/L (ref 3.5–5.1)
Sodium: 128 mmol/L — ABNORMAL LOW (ref 135–145)

## 2016-05-26 LAB — SODIUM
SODIUM: 129 mmol/L — AB (ref 135–145)
Sodium: 128 mmol/L — ABNORMAL LOW (ref 135–145)

## 2016-05-26 LAB — OSMOLALITY: OSMOLALITY: 272 mosm/kg — AB (ref 275–295)

## 2016-05-26 MED ORDER — FAT EMULSION 20 % IV EMUL
240.0000 mL | INTRAVENOUS | Status: AC
  Administered 2016-05-26: 240 mL via INTRAVENOUS
  Filled 2016-05-26: qty 250

## 2016-05-26 MED ORDER — TRACE MINERALS CR-CU-MN-SE-ZN 10-1000-500-60 MCG/ML IV SOLN
INTRAVENOUS | Status: AC
  Administered 2016-05-26: 18:00:00 via INTRAVENOUS
  Filled 2016-05-26: qty 1560

## 2016-05-26 NOTE — Progress Notes (Signed)
PARENTERAL NUTRITION CONSULT NOTE - FOLLOW UP  Pharmacy Consult:  TPN Indication:  Gastric Outlet Obstruction  Allergies  Allergen Reactions  . Ace Inhibitors Cough  . Prednisone Other (See Comments)    LETHARGY  . Sitagliptin Other (See Comments)    Noted on [facility]   Patient Measurements: Height: 5\' 2"  (157.5 cm) Weight: 193 lb 2 oz (87.6 kg) IBW/kg (Calculated) : 50.1 Adjusted Body Weight: 75 kg Usual Weight: 80 kg  Vital Signs: Temp: 98.4 F (36.9 C) (09/09 0300) Temp Source: Axillary (09/09 0300) BP: 114/78 (09/09 0300) Intake/Output from previous day: 09/08 0701 - 09/09 0700 In: 1408.8 [I.V.:1258.8; NG/GT:20; IV Piggyback:130] Out: 400 [Urine:400] Intake/Output from this shift: No intake/output data recorded.    Insulin Requirements in the past 24 hours:  4 units moderate SSI + 50 units regular insulin in TPN  Assessment: 57 YOF recently admitted to [facility] with gastric outlet obstruction due to necrotic esophagitis and discharged on home Unasyn and TPN to [facility].  Admitted to [facility] 04/21/2016 with shortness of breath, fever, and confusion.  PICC line was removed on admission due to concern for line infection - found to have fungemia.   She is to continue nutrition support with TPN.  GI: GOO on chronic TPN.  Prealbumin 3.5 > 8.1.  Bx suspicious for adenocarcinoma with plan for resection and pyloroplasty vs GJ once nutrition status improves. Endo: hx DM - CBGs trending down Lytes: hyponatremia prior to TPN initiation.  Low Na/CL 128/99, others WNL Cards: VSS - IV hydralazine Renal: SCr stable, BUN 31 - low UOP 0.2 ml/kg/hr Hepatobil: LFTs normalized, tbili / TG WNL ID: [name] D#10/14 for C.glabrata. +BCx after PICC placed, repeat BCx negative.  Afebrile, WBC WNL Best Practices: Protonix IV BID, SCDs TPN Access: PICC replaced 05/24/16 TPN start date: on TPN PTA  TPN at [facility]: (information obtained on 8/30 from [place] - pharmacist at [place]) Clinimix E 5/15 1440 ml + 20% lipids 192 ml 35 units of regular insulin ordered for each bag - per discussion there is question if this was actually being added to each bag at the facility  Current Nutrition:  TPN  Nutritional Goals: 1500-1700 kCal and 70-80 grams of protein per day   Plan: - Continue Clinimix E 5/15 at 65 ml/hr and lipids at 10 ml/hr.  TPN provides 1588 kCal and 78 gm of protein per day, meeting 100% of patient's needs. - Daily multivitamin and trace elements in TPN - Continue moderate SSI + reduce insulin in TPN to 35 units - Consider changing IV Protonix to Pepcid d/t Producer, television/film/video - F/U daily   Emileo Semel D. Mina Marble, PharmD, BCPS Pager:  [phone] 05/26/2016, 7:11 AM

## 2016-05-26 NOTE — Progress Notes (Signed)
[facility] Progress Note  Patient: [name] [name] [id number]   PCP: [name], MD DOB: 1935-05-04   DOA: 04/27/2016   DOS: 05/26/2016   Date of Service: the patient was seen and examined on 05/26/2016  Subjective: Patient denies any acute complaint. Complains about irritation from the tube. No abdominal pain. Nutrition: Nothing by mouth.  Brief hospital course: Pt. with PMH of type II DM, GERD, diverticulosis, HTN; admitted on 05/04/2016, with complaint of abdominal pain, shortness of breath and fever, was found to have fungemia. 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 Currently further plan is improvement in nutritional status.  Assessment and Plan: Sepsis Secondary to Fungemia (positive Candida glabrata) -febrile at nursing home to 104.5  - UA unremarkable  - chest x-ray improved compared to recent  -Continue Anidulafungin per ID, appreciate their input, total duration 14 days.  Gastric tumor Stomach/pyloric channel mass- SUSPICIOUS FOR ADENOCARCINOMA -CCS recommends gastrojejunal bypass and not a gastrectomy. Discuss with GI multiple times were recommends that surgery should be able to obtain a wedge biopsy for the patient of the mass. -After speaking with [facility] no current plan for further EGDs. -Remove Postpyloric by IR  - Await improvement inpatient nutritional status.  Hyponatremia likely acute in nature -Discussed with the pharmacy who is providing patient TPN. We will attempt to address it on 05/27/2016. In the meantime I'll get serum Osm and urine osm The patient and the findings we'll decide regarding IV fluids versus fluid restriction.  Protein calorie malnutrition. In the setting of gastric cancer causing prolonged decrease oral intake  Currently on TPN. Pharmacy monitoring.  Acute hypoxic respiratory failure / AMS -Resolved  Acute GI bleed / esophageal necrosis  / gastric outlet obstruction with bowel ulceration -EGD: Gastric tumor, esophagitis Currently resolved.  Hypokalemia -Resolved  Hypomagnesemia  monitor  Normocytic anemia(Baseline Hg ~9.0) stable, continue to monitor  HTN -Improved -Hydralazine 5 mg QID -Hydralazine PRN -Echocardiogram: Essentially normal  DM type 2 controlled without complication -[id number] Hemoglobin A1c = 6.6 -Moderate SSI   Pain management: When necessary Tylenol Activity: SNF as per physical therapy Bowel regimen: last BM in 05/18/2016 Diet: Nothing by mouth, continue TPN feeding DVT Prophylaxis: subcutaneous Heparin  Advance goals of care discussion: DNR/DNI  Family Communication: no family was present at bedside, at the time of interview.  Disposition:  Discharge to SNF.  Consultants: Gen. surgery, gastroenterology Procedures: TPN  Antibiotics: Anti-infectives    Start     Dose/Rate Route Frequency Ordered Stop   05/18/16 1700  anidulafungin ([name]) 100 mg in sodium chloride 0.9 % 100 mL IVPB     100 mg over 90 Minutes Intravenous Every 24 hours 04/22/2016 1627     04/27/2016 1700  anidulafungin ([name]) 200 mg in sodium chloride 0.9 % 200 mL IVPB     200 mg over 180 Minutes Intravenous  Once 04/30/2016 1627 05/04/2016 2003   05/16/16 0200  vancomycin (VANCOCIN) IVPB 750 mg/150 ml premix  Status:  Discontinued     750 mg 150 mL/hr over 60 Minutes Intravenous Every 12 hours 05/01/2016 1409 05/16/16 1045   04/18/2016 1400  fluconazole (DIFLUCAN) IVPB 100 mg  Status:  Discontinued     100 mg 50 mL/hr over 60 Minutes Intravenous Every 24 hours 04/27/2016 1317 05/11/2016 1653   04/19/2016 1400  vancomycin (VANCOCIN) IVPB 1000 mg/200 mL premix  Status:  Discontinued     1,000 mg 200 mL/hr  over 60 Minutes Intravenous  Once 05/04/2016 1353 05/05/2016 1407   04/29/2016 1330  vancomycin (VANCOCIN) IVPB 1000 mg/200 mL premix     1,000 mg 200 mL/hr over 60 Minutes Intravenous  Once 04/22/2016 1325 05/01/2016 1525   04/17/2016  1300  ceFEPIme (MAXIPIME) 2 g in dextrose 5 % 50 mL IVPB  Status:  Discontinued     2 g 100 mL/hr over 30 Minutes Intravenous Every 12 hours 05/05/2016 1257 05/16/16 1043        Intake/Output Summary (Last 24 hours) at 05/26/16 1601 Last data filed at 05/26/16 1510  Gross per 24 hour  Intake          1653.75 ml  Output              400 ml  Net          1253.75 ml   Filed Weights   05/23/16 0508 05/24/16 0500 05/25/16 0555  Weight: 85.9 kg (189 lb 6 oz) 87.9 kg (193 lb 12.6 oz) 87.6 kg (193 lb 2 oz)    Objective: Physical Exam: Vitals:   05/26/16 0827 05/26/16 1200 05/26/16 1300 05/26/16 1406  BP: (!) 148/55 99/66  116/64  Pulse: 95 87    Resp:  17 16   Temp: 98 F (36.7 C) 99.3 F (37.4 C) 99.2 F (37.3 C)   TempSrc: Axillary Axillary Axillary   SpO2:  92%    Weight:      Height:        General: Alert, Awake and Oriented to Time, Place and Person. Appear in mild distress Eyes: PERRL, Conjunctiva normal ENT: Oral Mucosa clear moist. Neck: difficult to assess JVD, no Abnormal Mass Or lumps Cardiovascular: S1 and S2 Present, aortic systolic Murmur, Respiratory: Bilateral Air entry equal and Decreased, Clear to Auscultation, no Crackles, no wheezes Abdomen: Bowel Sound present, Soft and no tenderness Skin: no redness, no Rash  Extremities: no Pedal edema, no calf tenderness Neurologic: Grossly no focal neuro deficit. Bilaterally Equal motor strength  Data Reviewed: CBC:  Recent Labs Lab 05/20/16 0545 05/21/16 0240 05/22/16 0500 05/24/16 0603 05/26/16 0530  WBC 3.7* 4.7 5.5 5.3 5.7  NEUTROABS  --  2.6  --   --  3.4  HGB 6.2* 10.5* 10.8* 10.6* 10.3*  HCT 20.2* 32.7* 33.7* 32.6* 31.8*  MCV 86.0 83.2 82.4 82.1 83.0  PLT 140* 182 196 220 [id number]   Basic Metabolic Panel:  Recent Labs Lab 05/21/16 0240 05/22/16 0500 05/23/16 0500 05/24/16 0603 05/25/16 0214 05/25/16 2025 05/25/16 2120 05/26/16 0059 05/26/16 0530  NA 134* 133* 130* 129* 128* 128* 128* 129*  128*  128*  K 4.2 3.7 3.6 4.3 5.1  --   --   --  4.5  CL 105 101 95* 99* 100*  --   --   --  99*  CO2 25 24 23 23  21*  --   --   --  24  GLUCOSE 109* 113* 149* 99 107*  --   --   --  122*  BUN 14 16 24* 27* 28*  --   --   --  31*  CREATININE 0.68 0.68 0.71 0.69 0.76  --   --   --  0.72  CALCIUM 7.8* 8.1* 7.7* 8.0* 8.0*  --   --   --  8.2*  MG 1.4* 1.5* 2.2 1.8 2.1  --   --   --   --   PHOS 3.3  --   --  3.7  --   --   --   --   --  Liver Function Tests:  Recent Labs Lab 05/21/16 0240 05/24/16 0603  AST 27 27  ALT 22 19  ALKPHOS 74 78  BILITOT 0.6 0.6  PROT 4.6* 4.7*  ALBUMIN 1.7* 1.7*   No results for input(s): LIPASE, AMYLASE in the last 168 hours. No results for input(s): AMMONIA in the last 168 hours. Coagulation Profile:  Recent Labs Lab 05/22/16 0500  INR 1.22   Cardiac Enzymes: No results for input(s): CKTOTAL, CKMB, CKMBINDEX, TROPONINI in the last 168 hours. BNP (last 3 results) No results for input(s): PROBNP in the last 8760 hours.  CBG:  Recent Labs Lab 05/25/16 2011 05/25/16 2239 05/26/16 0253 05/26/16 0830 05/26/16 1306  GLUCAP 102* 90 91 82 83    Studies: No results found.   Scheduled Meds: . anidulafungin  100 mg Intravenous Q24H  . antiseptic oral rinse  7 mL Mouth Rinse q12n4p  . chlorhexidine  15 mL Mouth Rinse BID  . hydrALAZINE  5 mg Intravenous Q6H  . insulin aspart  0-15 Units Subcutaneous Q4H  . pantoprazole  40 mg Intravenous Q12H  . phenol  1 spray Mouth/Throat BID  . sodium chloride flush  10-40 mL Intracatheter Q12H  . sodium chloride flush  3 mL Intravenous Q12H   Continuous Infusions: . Marland KitchenTPN (CLINIMIX-E) Adult 65 mL/hr at 05/26/16 0900   And  . fat emulsion 240 mL (05/25/16 1746)  . Marland KitchenTPN (CLINIMIX-E) Adult     And  . fat emulsion     PRN Meds: acetaminophen, albuterol, hydrALAZINE, menthol-cetylpyridinium, ondansetron, phenol, sodium chloride flush, sodium chloride flush  Time spent: 30  minutes  Author: Berle Mull, MD [facility] Pager: [phone] 05/26/2016 4:01 PM  If 7PM-7AM, please contact night-coverage at [email or web address], password [facility]

## 2016-05-27 LAB — BASIC METABOLIC PANEL
ANION GAP: 4 — AB (ref 5–15)
ANION GAP: 5 (ref 5–15)
BUN: 28 mg/dL — ABNORMAL HIGH (ref 6–20)
BUN: 26 mg/dL — AB (ref 6–20)
CALCIUM: 8 mg/dL — AB (ref 8.9–10.3)
CALCIUM: 7.9 mg/dL — AB (ref 8.9–10.3)
CO2: 24 mmol/L (ref 22–32)
CO2: 23 mmol/L (ref 22–32)
CREATININE: 0.63 mg/dL (ref 0.44–1.00)
Chloride: 99 mmol/L — ABNORMAL LOW (ref 101–111)
Chloride: 97 mmol/L — ABNORMAL LOW (ref 101–111)
Creatinine, Ser: 0.71 mg/dL (ref 0.44–1.00)
GFR calc Af Amer: >60 mL/min (ref >60)
GFR calc non Af Amer: >60 mL/min (ref >60)
GLUCOSE: 131 mg/dL — AB (ref 65–99)
Glucose, Bld: 125 mg/dL — ABNORMAL HIGH (ref 65–99)
POTASSIUM: 4.6 mmol/L (ref 3.5–5.1)
Potassium: 4.3 mmol/L (ref 3.5–5.1)
SODIUM: 125 mmol/L — AB (ref 135–145)
Sodium: 127 mmol/L — ABNORMAL LOW (ref 135–145)

## 2016-05-27 LAB — GLUCOSE, CAPILLARY
GLUCOSE-CAPILLARY: 102 mg/dL — AB (ref 65–99)
GLUCOSE-CAPILLARY: 123 mg/dL — AB (ref 65–99)
GLUCOSE-CAPILLARY: 120 mg/dL — AB (ref 65–99)
GLUCOSE-CAPILLARY: 133 mg/dL — AB (ref 65–99)
Glucose-Capillary: 109 mg/dL — ABNORMAL HIGH (ref 65–99)

## 2016-05-27 LAB — MAGNESIUM
Magnesium: 1.6 mg/dL — ABNORMAL LOW (ref 1.7–2.4)
Magnesium: 1.6 mg/dL — ABNORMAL LOW (ref 1.7–2.4)

## 2016-05-27 LAB — SODIUM, URINE, RANDOM: Sodium, Ur: 82 mmol/L

## 2016-05-27 LAB — CREATININE, URINE, RANDOM: Creatinine, Urine: 60.62 mg/dL

## 2016-05-27 LAB — OSMOLALITY, URINE: Osmolality, Ur: 742 mOsm/kg (ref 300–900)

## 2016-05-27 MED ORDER — TRACE MINERALS CR-CU-MN-SE-ZN 10-1000-500-60 MCG/ML IV SOLN
INTRAVENOUS | Status: DC
  Filled 2016-05-27: qty 1560

## 2016-05-27 MED ORDER — FAT EMULSION 20 % IV EMUL
240.0000 mL | INTRAVENOUS | Status: AC
  Administered 2016-05-27: 240 mL via INTRAVENOUS
  Filled 2016-05-27: qty 250

## 2016-05-27 MED ORDER — FAMOTIDINE IN NACL 20-0.9 MG/50ML-% IV SOLN
20.0000 mg | Freq: Two times a day (BID) | INTRAVENOUS | Status: DC
  Administered 2016-05-27 – 2016-05-28 (×2): 20 mg via INTRAVENOUS
  Filled 2016-05-27 (×4): qty 50

## 2016-05-27 MED ORDER — TRACE MINERALS CR-CU-MN-SE-ZN 10-1000-500-60 MCG/ML IV SOLN
INTRAVENOUS | Status: AC
  Administered 2016-05-27: 18:00:00 via INTRAVENOUS
  Filled 2016-05-27: qty 1560

## 2016-05-27 MED ORDER — FAT EMULSION 20 % IV EMUL
240.0000 mL | INTRAVENOUS | Status: DC
  Filled 2016-05-27: qty 250

## 2016-05-27 NOTE — Progress Notes (Signed)
[facility] Progress Note  Patient: [name] [id number]   PCP: [name], MD DOB: 02/06/1935   DOA: 05/01/2016   DOS: 05/27/2016   Date of Service: the patient was seen and examined on 05/27/2016  Subjective: Patient is awake, complains about lethargy and tiredness. He remains constipated. No nausea no vomiting. Nutrition: Nothing by mouth.  Brief hospital course: Pt. with PMH of type II DM, GERD, diverticulosis, HTN; admitted on 05/05/2016, with complaint of abdominal pain, shortness of breath and fever, was found to have fungemia. 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 Currently further plan is improvement in nutritional status.  Assessment and Plan: Sepsis Secondary to Fungemia (positive Candida glabrata) -febrile at nursing home to 104.5  - UA unremarkable  - chest x-ray improved compared to recent  -Continue Anidulafungin per ID, appreciate their input, total duration 14 days.  Gastric tumor Stomach/pyloric channel mass- SUSPICIOUS FOR ADENOCARCINOMA -CCS recommends gastrojejunal bypass and not a gastrectomy. Discuss with GI multiple times, recommends that surgery should be able to obtain a wedge biopsy for the patient of the mass. -After speaking with [facility] no current plan for further EGDs. -- Failed attempt at placement for Postpyloric tube by IR due to obstruction - Await improvement inpatient nutritional status.  Hyponatremia likely acute in nature -Discussed with the pharmacy who is providing patient TPN. Serum osmolality is low, urine osmolarity and sodium is high consistent with SIADH. Patient should be on fluid restriction are hypertonic saline. Pharmacy mentions that TPN is 1/4 isotonicity, and today they will add NACL with to make it 1/2 isotonicity. Continue to monitor sodium and if there is not improvement they will add more sodium too adjusted to normal  saline isotonicity.  Protein calorie malnutrition. In the setting of gastric cancer causing prolonged decrease oral intake  Currently on TPN. Pharmacy monitoring. Appreciate input  Acute hypoxic respiratory failure / AMS -Resolved  Acute GI bleed / esophageal necrosis / gastric outlet obstruction with bowel ulceration -EGD: Gastric tumor, esophagitis Currently resolved.  Hypokalemia -Resolved  Hypomagnesemia  Replace today and recheck tomorrow  Normocytic anemia(Baseline Hg ~9.0) stable, continue to monitor  HTN -Improved -Hydralazine PRN -Echocardiogram: EF [id number], diastolic dysfunction  DM type 2 controlled without complication -[id number] Hemoglobin A1c = 6.6 -Moderate SSI   Pain management: When necessary Tylenol Activity: SNF as per physical therapy Bowel regimen: last BM in 05/18/2016 Diet: Nothing by mouth, continue TPN feeding DVT Prophylaxis: subcutaneous Heparin  Advance goals of care discussion: DNR/DNI  Family Communication: no family was present at bedside, at the time of interview.  Disposition:  Discharge to SNF.  Consultants: Gen. surgery, gastroenterology Procedures: TPN  Antibiotics: Anti-infectives    Start     Dose/Rate Route Frequency Ordered Stop   05/18/16 1700  anidulafungin ([name]) 100 mg in sodium chloride 0.9 % 100 mL IVPB     100 mg over 90 Minutes Intravenous Every 24 hours 05/07/2016 1627     05/04/2016 1700  anidulafungin ([name]) 200 mg in sodium chloride 0.9 % 200 mL IVPB     200 mg over 180 Minutes Intravenous  Once 04/30/2016 1627 05/11/2016 2003   05/16/16 0200  vancomycin (VANCOCIN) IVPB 750 mg/150 ml premix  Status:  Discontinued     750 mg 150 mL/hr over 60 Minutes Intravenous Every 12 hours 04/20/2016 1409 05/16/16 1045   05/16/2016 1400  fluconazole (DIFLUCAN) IVPB 100 mg  Status:  Discontinued  100 mg 50 mL/hr over 60 Minutes Intravenous Every 24 hours 04/26/2016 1317 04/17/2016 1653   05/07/2016 1400  vancomycin (VANCOCIN) IVPB  1000 mg/200 mL premix  Status:  Discontinued     1,000 mg 200 mL/hr over 60 Minutes Intravenous  Once 04/26/2016 1353 04/20/2016 1407   04/27/2016 1330  vancomycin (VANCOCIN) IVPB 1000 mg/200 mL premix     1,000 mg 200 mL/hr over 60 Minutes Intravenous  Once 05/14/2016 1325 05/10/2016 1525   05/14/2016 1300  ceFEPIme (MAXIPIME) 2 g in dextrose 5 % 50 mL IVPB  Status:  Discontinued     2 g 100 mL/hr over 30 Minutes Intravenous Every 12 hours 05/09/2016 1257 05/16/16 1043        Intake/Output Summary (Last 24 hours) at 05/27/16 1844 Last data filed at 05/27/16 1556  Gross per 24 hour  Intake          1658.75 ml  Output              350 ml  Net          1308.75 ml   Filed Weights   05/24/16 0500 05/25/16 0555 05/27/16 0700  Weight: 87.9 kg (193 lb 12.6 oz) 87.6 kg (193 lb 2 oz) 83 kg (182 lb 15.7 oz)    Objective: Physical Exam: Vitals:   05/27/16 0700 05/27/16 0800 05/27/16 1200 05/27/16 1413  BP:  (!) 152/58 114/67 (!) 130/50  Pulse:  90 84 74  Resp:   (!) 24 19  Temp:  98.2 F (36.8 C) 98.5 F (36.9 C) 97.6 F (36.4 C)  TempSrc:  Axillary Oral Oral  SpO2:  91% 94% 97%  Weight: 83 kg (182 lb 15.7 oz)     Height:        General: Alert, Awake and Oriented to Time, Place and Person. Appear in mild distress Eyes: PERRL, Conjunctiva normal ENT: Oral Mucosa clear moist. Neck: difficult to assess JVD, no Abnormal Mass Or lumps Cardiovascular: S1 and S2 Present, aortic systolic Murmur, Respiratory: Bilateral Air entry equal and Decreased, Clear to Auscultation, no Crackles, no wheezes Abdomen: Bowel Sound present, Soft and no tenderness Skin: no redness, no Rash  Extremities: no Pedal edema, no calf tenderness Neurologic: Grossly no focal neuro deficit. Bilaterally Equal motor strength  Data Reviewed: CBC:  Recent Labs Lab 05/21/16 0240 05/22/16 0500 05/24/16 0603 05/26/16 0530  WBC 4.7 5.5 5.3 5.7  NEUTROABS 2.6  --   --  3.4  HGB 10.5* 10.8* 10.6* 10.3*  HCT 32.7* 33.7*  32.6* 31.8*  MCV 83.2 82.4 82.1 83.0  PLT 182 196 220 [id number]   Basic Metabolic Panel:  Recent Labs Lab 05/21/16 0240 05/22/16 0500 05/23/16 0500 05/24/16 0603 05/25/16 0214 05/25/16 2025 05/25/16 2120 05/26/16 0059 05/26/16 0530 05/27/16 0920  NA 134* 133* 130* 129* 128* 128* 128* 129* 128*  128* 127*  K 4.2 3.7 3.6 4.3 5.1  --   --   --  4.5 4.3  CL 105 101 95* 99* 100*  --   --   --  99* 99*  CO2 25 24 23 23  21*  --   --   --  24 24  GLUCOSE 109* 113* 149* 99 107*  --   --   --  122* 125*  BUN 14 16 24* 27* 28*  --   --   --  31* 28*  CREATININE 0.68 0.68 0.71 0.69 0.76  --   --   --  0.72 0.71  CALCIUM 7.8* 8.1* 7.7* 8.0* 8.0*  --   --   --  8.2* 8.0*  MG 1.4* 1.5* 2.2 1.8 2.1  --   --   --   --  1.6*  PHOS 3.3  --   --  3.7  --   --   --   --   --   --     Liver Function Tests:  Recent Labs Lab 05/21/16 0240 05/24/16 0603  AST 27 27  ALT 22 19  ALKPHOS 74 78  BILITOT 0.6 0.6  PROT 4.6* 4.7*  ALBUMIN 1.7* 1.7*   No results for input(s): LIPASE, AMYLASE in the last 168 hours. No results for input(s): AMMONIA in the last 168 hours. Coagulation Profile:  Recent Labs Lab 05/22/16 0500  INR 1.22   Cardiac Enzymes: No results for input(s): CKTOTAL, CKMB, CKMBINDEX, TROPONINI in the last 168 hours. BNP (last 3 results) No results for input(s): PROBNP in the last 8760 hours.  CBG:  Recent Labs Lab 05/26/16 2322 05/27/16 0349 05/27/16 0820 05/27/16 1208 05/27/16 1700  GLUCAP 96 102* 123* 109* 120*    Studies: No results found.   Scheduled Meds: . anidulafungin  100 mg Intravenous Q24H  . antiseptic oral rinse  7 mL Mouth Rinse q12n4p  . chlorhexidine  15 mL Mouth Rinse BID  . famotidine (PEPCID) IV  20 mg Intravenous Q12H  . insulin aspart  0-15 Units Subcutaneous Q4H  . phenol  1 spray Mouth/Throat BID  . sodium chloride flush  10-40 mL Intracatheter Q12H  . sodium chloride flush  3 mL Intravenous Q12H   Continuous Infusions: . Marland KitchenTPN  (CLINIMIX-E) Adult 65 mL/hr at 05/27/16 1757   And  . fat emulsion 240 mL (05/27/16 1758)   PRN Meds: acetaminophen, albuterol, hydrALAZINE, menthol-cetylpyridinium, ondansetron, phenol, sodium chloride flush, sodium chloride flush  Time spent: 30 minutes  Author: Berle Mull, MD [facility] Pager: [phone] 05/27/2016 6:44 PM  If 7PM-7AM, please contact night-coverage at [email or web address], password [facility]

## 2016-05-27 NOTE — Progress Notes (Signed)
PARENTERAL NUTRITION CONSULT NOTE - FOLLOW UP  Pharmacy Consult:  TPN Indication:  Gastric Outlet Obstruction  Allergies  Allergen Reactions  . Ace Inhibitors Cough  . Prednisone Other (See Comments)    LETHARGY  . Sitagliptin Other (See Comments)    Noted on [name]   Patient Measurements: Height: 5\' 2"  (157.5 cm) Weight: 193 lb 2 oz (87.6 kg) IBW/kg (Calculated) : 50.1 Adjusted Body Weight: 75 kg Usual Weight: 80 kg  Vital Signs: Temp: 98.1 F (36.7 C) (09/10 0603) Temp Source: Oral (09/10 0603) BP: 139/70 (09/10 0603) Pulse Rate: 95 (09/10 0603) Intake/Output from previous day: 09/09 0701 - 09/10 0700 In: 960 [I.V.:830; IV Piggyback:130] Out: 600 [Urine:600] Intake/Output from this shift: No intake/output data recorded.    Insulin Requirements in the past 24 hours:  0 units moderate SSI + 35 units regular insulin in TPN  Assessment: [name] recently admitted to [name] with gastric outlet obstruction due to necrotic esophagitis and discharged on home Unasyn and TPN to [facility].  Admitted to [name] 04/19/2016 with shortness of breath, fever, and confusion.  PICC line was removed on admission due to concern for line infection - found to have fungemia.   She is to continue nutrition support with TPN.  GI: GOO on chronic TPN.  IV PPI.  Prealbumin 3.5 > 8.1.  Bx suspicious for adenocarcinoma with plan for resection and pyloroplasty vs GJ once nutrition status improves. Endo: hx DM - CBGs low normal Lytes: hyponatremia prior to TPN initiation - TRH requests adding Na to TPN 9/10.  Low Na/CL 128/99, others WNL Cards: VSS - IV hydralazine Renal: SCr stable, BUN 31 - low UOP 0.3 ml/kg/hr Hepatobil: LFTs normalized, tbili / TG WNL ID: [name] D#11/14 for C.glabrata. +BCx after PICC placed, repeat BCx negative.  Afebrile, WBC WNL Best Practices: Protonix IV BID, SCDs TPN Access: PICC replaced 05/24/16 TPN start date: on TPN PTA  TPN at [name]: (information obtained  on 8/30 from [place] - pharmacist at [place]) Clinimix E 5/15 1440 ml + 20% lipids 192 ml 35 units of regular insulin ordered for each bag - per discussion there is question if this was actually being added to each bag at the facility  Current Nutrition:  TPN  Nutritional Goals: 1500-1700 kCal and 70-80 grams of protein per day   Plan: - Continue Clinimix E 5/15 at 65 ml/hr and lipids at 10 ml/hr.  TPN provides 1588 kCal and 78 gm of protein per day, meeting 100% of patient's needs. - Per MD request, add 66 mEq NaCL to make Clinimix 1/2NS concentration.  F/U Na/CL and add increase to NS as appropriate. - Daily multivitamin and trace elements in TPN - Continue moderate SSI + reduce insulin in TPN to 20 units - Consider changing IV Protonix to Pepcid d/t Producer, television/film/video - F/U AM labs   [name] [name] D. Mina [name], PharmD, BCPS Pager:  [phone]  05/27/2016, 7:19 AM

## 2016-05-27 NOTE — Progress Notes (Signed)
Pt transferred from [facility] via bed by nurse and NT. Pt stable, talking. IV infusing TPN @ 65 and Fat Emulsion @ 10. Tele box 2 applied and central telemetry called, verified by Aria NT. Will continue to monitor as needed.

## 2016-05-28 LAB — GLUCOSE, CAPILLARY
GLUCOSE-CAPILLARY: 152 mg/dL — AB (ref 65–99)
GLUCOSE-CAPILLARY: 158 mg/dL — AB (ref 65–99)
Glucose-Capillary: 104 mg/dL — ABNORMAL HIGH (ref 65–99)
Glucose-Capillary: 112 mg/dL — ABNORMAL HIGH (ref 65–99)
Glucose-Capillary: 122 mg/dL — ABNORMAL HIGH (ref 65–99)
Glucose-Capillary: 153 mg/dL — ABNORMAL HIGH (ref 65–99)

## 2016-05-28 LAB — CBC
HCT: 30.7 % — ABNORMAL LOW (ref 36.0–46.0)
HEMOGLOBIN: 9.8 g/dL — AB (ref 12.0–15.0)
MCH: 26.3 pg (ref 26.0–34.0)
MCHC: 31.9 g/dL (ref 30.0–36.0)
MCV: 82.5 fL (ref 78.0–100.0)
Platelets: 204 10*3/uL (ref 150–400)
RBC: 3.72 MIL/uL — ABNORMAL LOW (ref 3.87–5.11)
RDW: 15 % (ref 11.5–15.5)
WBC: 4.3 10*3/uL (ref 4.0–10.5)

## 2016-05-28 LAB — DIFFERENTIAL
BASOS PCT: 0 %
Basophils Absolute: 0 10*3/uL (ref 0.0–0.1)
EOS PCT: 4 %
Eosinophils Absolute: 0.2 10*3/uL (ref 0.0–0.7)
LYMPHS ABS: 1.1 10*3/uL (ref 0.7–4.0)
Lymphocytes Relative: 25 %
Monocytes Absolute: 0.6 10*3/uL (ref 0.1–1.0)
Monocytes Relative: 15 %
NEUTROS PCT: 56 %
Neutro Abs: 2.4 10*3/uL (ref 1.7–7.7)

## 2016-05-28 LAB — MAGNESIUM: MAGNESIUM: 1.5 mg/dL — AB (ref 1.7–2.4)

## 2016-05-28 LAB — COMPREHENSIVE METABOLIC PANEL
ALK PHOS: 80 U/L (ref 38–126)
ALT: 18 U/L (ref 14–54)
AST: 28 U/L (ref 15–41)
Albumin: 1.7 g/dL — ABNORMAL LOW (ref 3.5–5.0)
Anion gap: 5 (ref 5–15)
BILIRUBIN TOTAL: 0.5 mg/dL (ref 0.3–1.2)
BUN: 26 mg/dL — AB (ref 6–20)
CALCIUM: 8 mg/dL — AB (ref 8.9–10.3)
CHLORIDE: 99 mmol/L — AB (ref 101–111)
CO2: 25 mmol/L (ref 22–32)
CREATININE: 0.65 mg/dL (ref 0.44–1.00)
Glucose, Bld: 102 mg/dL — ABNORMAL HIGH (ref 65–99)
Potassium: 4.4 mmol/L (ref 3.5–5.1)
Sodium: 129 mmol/L — ABNORMAL LOW (ref 135–145)
TOTAL PROTEIN: 4.8 g/dL — AB (ref 6.5–8.1)

## 2016-05-28 LAB — PHOSPHORUS: Phosphorus: 3.5 mg/dL (ref 2.5–4.6)

## 2016-05-28 LAB — PREALBUMIN: PREALBUMIN: 5.5 mg/dL — AB (ref 18–38)

## 2016-05-28 LAB — TRIGLYCERIDES: Triglycerides: 49 mg/dL (ref <150)

## 2016-05-28 MED ORDER — FAT EMULSION 20 % IV EMUL
240.0000 mL | INTRAVENOUS | Status: AC
  Administered 2016-05-28: 240 mL via INTRAVENOUS
  Filled 2016-05-28: qty 250

## 2016-05-28 MED ORDER — MAGNESIUM SULFATE 2 GM/50ML IV SOLN
2.0000 g | Freq: Once | INTRAVENOUS | Status: AC
  Administered 2016-05-28: 2 g via INTRAVENOUS
  Filled 2016-05-28 (×2): qty 50

## 2016-05-28 MED ORDER — SODIUM CHLORIDE 4 MEQ/ML IV SOLN
INTRAVENOUS | Status: AC
  Administered 2016-05-28: 17:00:00 via INTRAVENOUS
  Filled 2016-05-28: qty 1560

## 2016-05-28 NOTE — Care Management Important Message (Signed)
Important Message  Patient Details  Name: [name] [name] MRN: [id number] Date of Birth: December 31, 1934   Medicare Important Message Given:  Yes    [name] 05/28/2016, 2:18 PM

## 2016-05-28 NOTE — Progress Notes (Signed)
PARENTERAL NUTRITION CONSULT NOTE - FOLLOW UP  Pharmacy Consult:  TPN Indication:  Gastric Outlet Obstruction  Allergies  Allergen Reactions  . Ace Inhibitors Cough  . Prednisone Other (See Comments)    LETHARGY  . Sitagliptin Other (See Comments)    Noted on [facility]   Patient Measurements: Height: 5\' 2"  (157.5 cm) Weight: 184 lb 3.2 oz (83.6 kg) IBW/kg (Calculated) : 50.1 Adjusted Body Weight: 75 kg Usual Weight: 80 kg  Vital Signs: Temp: 97.3 F (36.3 C) (09/10 2229) Temp Source: Oral (09/10 2229) BP: 146/77 (09/11 0604) Pulse Rate: 78 (09/11 0604) Intake/Output from previous day: 09/10 0701 - 09/11 0700 In: 2814.8 [I.V.:2634.8; IV Piggyback:180] Out: -  Intake/Output from this shift: No intake/output data recorded.    Insulin Requirements in the past 24 hours:  7 units moderate SSI + 20 units regular insulin in TPN (decreased)  Assessment: 12 YOF recently admitted to [facility] with gastric outlet obstruction due to necrotic esophagitis and discharged on home Unasyn and TPN to [facility]. Admitted to [facility] 05/17/2016 with shortness of breath, fever, and confusion. PICC line was removed on admission due to concern for line infection - found to have fungemia. She is to continue nutrition support with TPN.  GI: GOO on chronic TPN. Failed attempt to place postpyloric tube by IR due to obstruction. IV Pepcid. Prealbumin 3.5 > 8.1 > 5.5. Bx suspicious for adenocarcinoma with plan for resection and pyloroplasty vs GJ once nutrition status improves. Endo: hx DM - CBGs 102-152 on reduced ins in TPN bag Lytes: hyponatremia prior to TPN initiation - TRH requests adding Na to TPN 9/10. Low Na/CL 129/99, Mag low 1.5 Cards: VSS Renal: SCr stable wnl, BUN 31 - low UOP 0.3 ml/kg/hr per documentation Hepatobil: LFTs WNL, tbili / TG WNL ID: [name] D#12/14 for C.glabrata. +BCx after PICC placed, repeat BCx negative. Afebrile, WBC WNL Best Practices: Pepcid IV BID, SCDs TPN Access: PICC  replaced 05/24/16 TPN start date: on TPN PTA  TPN at [facility]: (information obtained on 8/30 from [place] - pharmacist at [place]) Clinimix E 5/15 1440 ml + 20% lipids 192 ml 35 units of regular insulin ordered for each bag - per discussion there is question if this was actually being added to each bag at the facility  Current Nutrition:  TPN  Nutritional Goals: 1500-1700 kCal and 70-80 grams of protein per day   Plan: - Continue Clinimix E 5/15 at 65 ml/hr and lipids at 10 ml/hr. TPN provides 1588 kCal and 78 gm of protein per day, meeting 100% of patient's needs. - Per MD request, add 186 mEq NaCL to make Clinimix NS concentration. F/U Na/CL and adjust as appropriate - Daily multivitamin and trace elements in TPN - Add Pepcid 40mg  to TPN bag - Continue moderate SSI + 20 units insulin in TPN bag - Mag sulfate 2g IV x 1 - F/U AM labs   Elicia Lamp, PharmD, [facility] Clinical Pharmacist Pager [phone] 05/28/2016 8:10 AM

## 2016-05-28 NOTE — Progress Notes (Signed)
Patient ID: [name] [name], female   DOB: 06/07/1935, 81 y.o.   MRN: [id number]  [facility] Surgery Progress Note  11 Days Post-Op  Subjective: No complaints. Denies any abdominal pain, nausea, vomiting.  Objective: Vital signs in last 24 hours: Temp:  [97.3 F (36.3 C)-98.5 F (36.9 C)] 97.3 F (36.3 C) (09/10 2229) Pulse Rate:  [74-84] 78 (09/11 0604) Resp:  [18-24] 18 (09/11 0604) BP: (114-155)/(48-77) 146/77 (09/11 0604) SpO2:  [94 %-97 %] 94 % (09/11 0604) Weight:  [184 lb 3.2 oz (83.6 kg)] 184 lb 3.2 oz (83.6 kg) (09/11 0604) Last BM Date: 05/24/16  Intake/Output from previous day: 09/10 0701 - 09/11 0700 In: 2814.8 [I.V.:2634.8; IV Piggyback:180] Out: -  Intake/Output this shift: No intake/output data recorded.  PE: Gen:  Alert, NAD, pleasant Pulm:  CTAB Abd: Soft, NT,mild distention, +BS   Lab Results:   Recent Labs  05/26/16 0530 05/28/16 0430  WBC 5.7 4.3  HGB 10.3* 9.8*  HCT 31.8* 30.7*  PLT 241 204   BMET  Recent Labs  05/27/16 2005 05/28/16 0430  NA 125* 129*  K 4.6 4.4  CL 97* 99*  CO2 23 25  GLUCOSE 131* 102*  BUN 26* 26*  CREATININE 0.63 0.65  CALCIUM 7.9* 8.0*   PT/INR No results for input(s): LABPROT, INR in the last 72 hours. CMP     Component Value Date/Time   NA 129 (L) 05/28/2016 0430   K 4.4 05/28/2016 0430   CL 99 (L) 05/28/2016 0430   CO2 25 05/28/2016 0430   GLUCOSE 102 (H) 05/28/2016 0430   BUN 26 (H) 05/28/2016 0430   CREATININE 0.65 05/28/2016 0430   CREATININE 0.78 11/07/2012 1457   CALCIUM 8.0 (L) 05/28/2016 0430   PROT 4.8 (L) 05/28/2016 0430   ALBUMIN 1.7 (L) 05/28/2016 0430   AST 28 05/28/2016 0430   ALT 18 05/28/2016 0430   ALKPHOS 80 05/28/2016 0430   BILITOT 0.5 05/28/2016 0430   GFRNONAA >60 05/28/2016 0430   GFRAA >60 05/28/2016 0430   Lipase  No results found for: LIPASE     Studies/Results: No results found.  Anti-infectives: Anti-infectives    Start     Dose/Rate Route  Frequency Ordered Stop   05/18/16 1700  anidulafungin ([name]) 100 mg in sodium chloride 0.9 % 100 mL IVPB     100 mg over 90 Minutes Intravenous Every 24 hours 05/03/2016 1627     05/07/2016 1700  anidulafungin ([name]) 200 mg in sodium chloride 0.9 % 200 mL IVPB     200 mg over 180 Minutes Intravenous  Once 05/09/2016 1627 05/10/2016 2003   05/16/16 0200  vancomycin (VANCOCIN) IVPB 750 mg/150 ml premix  Status:  Discontinued     750 mg 150 mL/hr over 60 Minutes Intravenous Every 12 hours 04/27/2016 1409 05/16/16 1045   04/25/2016 1400  fluconazole (DIFLUCAN) IVPB 100 mg  Status:  Discontinued     100 mg 50 mL/hr over 60 Minutes Intravenous Every 24 hours 05/10/2016 1317 05/12/2016 1653   04/28/2016 1400  vancomycin (VANCOCIN) IVPB 1000 mg/200 mL premix  Status:  Discontinued     1,000 mg 200 mL/hr over 60 Minutes Intravenous  Once 05/10/2016 1353 04/17/2016 1407   04/18/2016 1330  vancomycin (VANCOCIN) IVPB 1000 mg/200 mL premix     1,000 mg 200 mL/hr over 60 Minutes Intravenous  Once 04/23/2016 1325 04/28/2016 1525   05/08/2016 1300  ceFEPIme (MAXIPIME) 2 g in dextrose 5 % 50 mL IVPB  Status:  Discontinued     2 g 100 mL/hr over 30 Minutes Intravenous Every 12 hours 04/23/2016 1257 05/16/16 1043       Assessment/Plan Gastric outlet obstruction, gastric tumor at pylorus and in prepyloric region of the stomach- SUSPICIOUS FOR ADENOCARCINOMA, esophagitis -awaiting improved nutritional status prior to surgery -prealbumin down today 5.5 -failed attempt by IR to reposition postpyloric tube. -continue TPN -continue pepcid BID Sepsis secondary to fungemia (positive candida glabrata) -cxr improving. On anidulafungin Chronic Hyponatremia - continue to monitor, slightly improved today 129 from 125, pharmacy monitoring Hypokalemia - stable, 4.4 today, pharmacy monitoring Hypomagnesemia - 1.5 today, pharmacy monitoring Normocytic anemia -stable, Hemoglobin 9.8 Malnourished - prealbumin 5.5 HTN DM2   VTE -  heparin FEN - NPO, TPN  Plan - awaiting improved nutritional status prior to surgery   LOS: 13 days    Jerrye Beavers , [facility] Surgery 05/28/2016, 11:14 AM Pager: [phone] Consults: [phone] Mon-Fri 7:00 am-4:30 pm Sat-Sun 7:00 am-11:30 am

## 2016-05-28 NOTE — Progress Notes (Signed)
Physical Therapy Treatment Patient Details Name: [name] MRN: [id number] DOB: Apr 26, 1935 Today's Date: 05/28/2016    History of Present Illness 81 y.o.femalewith history of degenerative disc disease, DM, diverticulosis, GERD, hiatal hernia, HLD, HTN, and chronic hyponatremia who was admitted 04/14/2016 >04/27/2016 for treatment of acute GI bleed with gastric outlet obstruction due to necrotic esophagitis with superficial fungal infection and ulceration with subsequent development of possible right-sided pneumonitis and aspiration pneumonia. She returned to the ED c/o shortness of breath, fevers, and confusion. +sepsis (?due to PICC)    PT Comments    Pt performed increased mobility required frequent cueing throughout.  Pt perseverating on how she got to the hospital.  Pt would continue to benefit from short term rehab in a post acute setting.    Follow Up Recommendations  SNF     Equipment Recommendations  None recommended by PT    Recommendations for Other Services       Precautions / Restrictions Precautions Precautions: Fall Restrictions Weight Bearing Restrictions: No    Mobility  Bed Mobility Overal bed mobility: Needs Assistance       Supine to sit: Min assist     General bed mobility comments: Cues for hand placement. Pt sitting halfway up unassisted therfore rolling not performed.   Transfers Overall transfer level: Needs assistance Equipment used: Rolling walker (2 wheeled) Transfers: Sit to/from Stand Sit to Stand: Min assist (slow to ascend from bed and BSC)         General transfer comment: Cues to scoot forward to edge of bed and cues for hand placement to and from seated surface.  Pt demonstrates poor eccentric loading to seated surface.    Ambulation/Gait Ambulation/Gait assistance: Mod assist;+2 safety/equipment Ambulation Distance (Feet): 15 Feet Assistive device: Rolling walker (2 wheeled) (with close chair follow) Gait  Pattern/deviations: Step-through pattern;Trunk flexed;Shuffle;Decreased stride length     General Gait Details: Cues for upper trunk control and increasing B stride length.  Pt fatigues quickly, noticeable crepitus during gait training.     Stairs            Wheelchair Mobility    Modified Rankin (Stroke Patients Only)       Balance     Sitting balance-Leahy Scale: Fair       Standing balance-Leahy Scale: Poor                      Cognition Arousal/Alertness: Awake/alert Behavior During Therapy: WFL for tasks assessed/performed Overall Cognitive Status: Impaired/Different from baseline Area of Impairment: Orientation Orientation Level: Place;Situation;Time (reports she is in my house and likes the drapery.  )   Memory: Decreased recall of precautions;Decreased short-term memory         General Comments: Pt reports she does not know how she got to the hospital and she's concerned her daughter won't know where to find her, informed RN.      Exercises      General Comments        Pertinent Vitals/Pain Pain Assessment: No/denies pain    Home Living                      Prior Function            PT Goals (current goals can now be found in the care plan section) Acute Rehab PT Goals Patient Stated Goal: Keep my strength up. I don't like lying around in bed Potential to Achieve Goals: Good Progress towards PT  goals: Progressing toward goals    Frequency       PT Plan Current plan remains appropriate    Co-evaluation             End of Session Equipment Utilized During Treatment: Gait belt Activity Tolerance: Patient limited by fatigue Patient left: in chair;with call bell/phone within reach;with nursing/sitter in room;with chair alarm set (IV nurse in with patient's post session. )     Time: [id number] PT Time Calculation (min) (ACUTE ONLY): 27 min  Charges:  $Gait Training: 8-22 mins $Therapeutic Activity: 8-22  mins                    G Codes:      Cristela Blue 27-Jun-2016, 5:30 PM Governor Rooks, PTA pager [phone]

## 2016-05-28 NOTE — Progress Notes (Addendum)
[facility] Progress Note  Patient: [name] [name] [id number]   PCP: [name], MD DOB: [birth year]   DOA: 04/24/2016   DOS: 05/28/2016   Date of Service: the patient was seen and examined on 05/28/2016  Subjective: Patient is AAOX3, afebrile, denies CP and SOB.  Brief hospital course: Pt. with PMH of type II DM, GERD, diverticulosis, HTN; admitted on 05/09/2016, with complaint of abdominal pain, shortness of breath and fever, was found to have fungemia. 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 Currently further plan is improvement in nutritional status before surgery. Continue TPN. SNF once medically stable.  Assessment and Plan: Sepsis Secondary to Fungemia (positive Candida glabrata) -currently afebrile and responding to therapy -will complete a total of 14 days of Anidulafungin per ID -continue supportive care  Gastric tumor Stomach/pyloric channel mass- SUSPICIOUS FOR ADENOCARCINOMA -CCS recommends gastrojejunal bypass and not a gastrectomy. -Discuss with GI multiple times, recommends that surgery should be able to obtain a wedge biopsy of the patient of the mass. There is no plans for further EGDs. - Failed attempt at placement for Postpyloric tube by IR due to obstruction - Await improvement in her nutritional status prior to surgery. -continue TPN  Hyponatremia likely acute in nature -Discussed with the pharmacy who is providing patient TPN. -Continue to monitor sodium -improving, Na currently 129  Severe Protein calorie malnutrition. -In the setting of gastric cancer causing prolonged decrease oral intake  -Currently on TPN. -Pharmacy monitoring and adjusting as needed   Acute hypoxic respiratory failure / AMS -Resolved -will monitor; not needing O2 supplementation currently   Acute GI bleed / esophageal necrosis / gastric outlet obstruction with bowel ulceration -EGD:  Gastric tumor, esophagitis Currently w/o sign sof active bleeding  -plan is for surgery at some point later this week  Hypokalemia -continue repletion as needed   Hypomagnesemia Continue repletion as needed   Normocytic anemia(Baseline Hg ~9.0) -stable, continue to monitor  HTN -stable -continue Hydralazine PRN -Echocardiogram: EF 123456, diastolic dysfunction  DM type 2 controlled without complication -[id number] Hemoglobin A1c = 6.6 -continue SSI   Bowel regimen: last BM in 05/18/2016 Diet: Nothing by mouth, continue TPN feeding DVT Prophylaxis: SCD's  Advance goals of care discussion: DNR/DNI  Family Communication: no family was present at bedside, at the time of interview.  Disposition:  Discharge to SNF when medically stable.  Consultants: Gen. surgery, gastroenterology and pharmacy  Procedures: TPN  Antibiotics: Anti-infectives    Start     Dose/Rate Route Frequency Ordered Stop   05/18/16 1700  anidulafungin ([name]) 100 mg in sodium chloride 0.9 % 100 mL IVPB     100 mg over 90 Minutes Intravenous Every 24 hours 04/22/2016 1627     05/06/2016 1700  anidulafungin ([name]) 200 mg in sodium chloride 0.9 % 200 mL IVPB     200 mg over 180 Minutes Intravenous  Once 04/30/2016 1627 05/12/2016 2003   05/16/16 0200  vancomycin (VANCOCIN) IVPB 750 mg/150 ml premix  Status:  Discontinued     750 mg 150 mL/hr over 60 Minutes Intravenous Every 12 hours 05/12/2016 1409 05/16/16 1045   04/19/2016 1400  fluconazole (DIFLUCAN) IVPB 100 mg  Status:  Discontinued     100 mg 50 mL/hr over 60 Minutes Intravenous Every 24 hours 05/05/2016 1317 05/04/2016 1653   05/15/16 1400  vancomycin (VANCOCIN) IVPB 1000 mg/200 mL premix  Status:  Discontinued     1,000  mg 200 mL/hr over 60 Minutes Intravenous  Once 05/09/2016 1353 05/11/2016 1407   05/08/2016 1330  vancomycin (VANCOCIN) IVPB 1000 mg/200 mL premix     1,000 mg 200 mL/hr over 60 Minutes Intravenous  Once 04/18/2016 1325 05/07/2016 1525   04/24/2016 1300   ceFEPIme (MAXIPIME) 2 g in dextrose 5 % 50 mL IVPB  Status:  Discontinued     2 g 100 mL/hr over 30 Minutes Intravenous Every 12 hours 05/11/2016 1257 05/16/16 1043        Intake/Output Summary (Last 24 hours) at 05/28/16 1700 Last data filed at 05/28/16 1600  Gross per 24 hour  Intake          1833.59 ml  Output                0 ml  Net          1833.59 ml   Filed Weights   05/25/16 0555 05/27/16 0700 05/28/16 0604  Weight: 87.6 kg (193 lb 2 oz) 83 kg (182 lb 15.7 oz) 83.6 kg (184 lb 3.2 oz)    Objective: Physical Exam: Vitals:   05/27/16 1413 05/27/16 2229 05/28/16 0604 05/28/16 1255  BP: (!) 130/50 (!) 155/48 (!) 146/77 130/67  Pulse: 74 80 78 90  Resp: 19 18 18 18   Temp: 97.6 F (36.4 C) 97.3 F (36.3 C)  98.4 F (36.9 C)  TempSrc: Oral Oral  Oral  SpO2: 97% 94% 94% 100%  Weight:   83.6 kg (184 lb 3.2 oz)   Height:        General: Alert, Awake and Oriented to Time, Place and Person. Appear in no acute distress currently. Afebrile, no CP and no SOB. Eyes: PERRL, Conjunctiva normal, no icterus ENT: Oral Mucosa clear moist; no thrush  Neck: no bruits, no lumps. Unable to properly assess for JVD's due to body habitus Cardiovascular: S1 and S2 Present, positive systolic Murmur, Respiratory: Bilateral Air entry equal and Decreased at the basis, no Crackles, no wheezes Abdomen: Bowel Sound present, Soft and no tenderness Skin: no redness, no Rash  Extremities: no Pedal edema, no calf tenderness Neurologic: Grossly no focal neuro deficit. Bilaterally Equal motor strength  Data Reviewed: CBC:  Recent Labs Lab 05/22/16 0500 05/24/16 0603 05/26/16 0530 05/28/16 0430  WBC 5.5 5.3 5.7 4.3  NEUTROABS  --   --  3.4 2.4  HGB 10.8* 10.6* 10.3* 9.8*  HCT 33.7* 32.6* 31.8* 30.7*  MCV 82.4 82.1 83.0 82.5  PLT 196 220 241 0000000   Basic Metabolic Panel:  Recent Labs Lab 05/24/16 0603 05/25/16 0214  05/26/16 0059 05/26/16 0530 05/27/16 0920 05/27/16 2005  05/28/16 0430  NA 129* 128*  < > 129* 128*  128* 127* 125* 129*  K 4.3 5.1  --   --  4.5 4.3 4.6 4.4  CL 99* 100*  --   --  99* 99* 97* 99*  CO2 23 21*  --   --  24 24 23 25   GLUCOSE 99 107*  --   --  122* 125* 131* 102*  BUN 27* 28*  --   --  31* 28* 26* 26*  CREATININE 0.69 0.76  --   --  0.72 0.71 0.63 0.65  CALCIUM 8.0* 8.0*  --   --  8.2* 8.0* 7.9* 8.0*  MG 1.8 2.1  --   --   --  1.6* 1.6* 1.5*  PHOS 3.7  --   --   --   --   --   --  3.5  < > = values in this interval not displayed.  Liver Function Tests:  Recent Labs Lab 05/24/16 0603 05/28/16 0430  AST 27 28  ALT 19 18  ALKPHOS 78 80  BILITOT 0.6 0.5  PROT 4.7* 4.8*  ALBUMIN 1.7* 1.7*   Coagulation Profile:  Recent Labs Lab 05/22/16 0500  INR 1.22   CBG:  Recent Labs Lab 05/28/16 0015 05/28/16 0353 05/28/16 0755 05/28/16 1153 05/28/16 1641  GLUCAP 153* 104* 152* 158* 112*    Studies: No results found.   Scheduled Meds: . anidulafungin  100 mg Intravenous Q24H  . antiseptic oral rinse  7 mL Mouth Rinse q12n4p  . chlorhexidine  15 mL Mouth Rinse BID  . insulin aspart  0-15 Units Subcutaneous Q4H  . phenol  1 spray Mouth/Throat BID  . sodium chloride flush  10-40 mL Intracatheter Q12H  . sodium chloride flush  3 mL Intravenous Q12H   Continuous Infusions: . Marland KitchenTPN (CLINIMIX-E) Adult 65 mL/hr at 05/28/16 1600   And  . fat emulsion 240 mL (05/28/16 1600)  . Marland KitchenTPN (CLINIMIX-E) Adult     And  . fat emulsion     PRN Meds: acetaminophen, albuterol, hydrALAZINE, menthol-cetylpyridinium, ondansetron, phenol, sodium chloride flush, sodium chloride flush  Time spent: 30 minutes  Author: Barton Dubois MD [facility] Pager: [phone] 05/28/2016 5:00 PM  If 7PM-7AM, please contact night-coverage at [email or web address], password [facility]

## 2016-05-28 NOTE — Care Management Important Message (Signed)
Important Message  Patient Details  Name: [name] [name] MRN: [id number] Date of Birth: [birth year]   Medicare Important Message Given:  Yes    Yunique Dearcos [address]ine Circle 05/28/2016, 2:19 PM

## 2016-05-29 LAB — PHOSPHORUS: Phosphorus: 3.4 mg/dL (ref 2.5–4.6)

## 2016-05-29 LAB — BASIC METABOLIC PANEL
Anion gap: 5 (ref 5–15)
BUN: 24 mg/dL — ABNORMAL HIGH (ref 6–20)
CALCIUM: 8.1 mg/dL — AB (ref 8.9–10.3)
CO2: 25 mmol/L (ref 22–32)
CREATININE: 0.6 mg/dL (ref 0.44–1.00)
Chloride: 98 mmol/L — ABNORMAL LOW (ref 101–111)
GFR calc non Af Amer: >60 mL/min (ref >60)
GLUCOSE: 116 mg/dL — AB (ref 65–99)
Potassium: 3.9 mmol/L (ref 3.5–5.1)
Sodium: 128 mmol/L — ABNORMAL LOW (ref 135–145)

## 2016-05-29 LAB — GLUCOSE, CAPILLARY
GLUCOSE-CAPILLARY: 114 mg/dL — AB (ref 65–99)
GLUCOSE-CAPILLARY: 120 mg/dL — AB (ref 65–99)
GLUCOSE-CAPILLARY: 142 mg/dL — AB (ref 65–99)
GLUCOSE-CAPILLARY: 154 mg/dL — AB (ref 65–99)
GLUCOSE-CAPILLARY: 124 mg/dL — AB (ref 65–99)
Glucose-Capillary: 143 mg/dL — ABNORMAL HIGH (ref 65–99)

## 2016-05-29 LAB — MAGNESIUM: Magnesium: 1.9 mg/dL (ref 1.7–2.4)

## 2016-05-29 MED ORDER — IPRATROPIUM-ALBUTEROL 0.5-2.5 (3) MG/3ML IN SOLN: 3.0000 mL | Freq: Four times a day (QID) | RESPIRATORY_TRACT | Status: DC | PRN

## 2016-05-29 MED ORDER — TRACE MINERALS CR-CU-MN-SE-ZN 10-1000-500-60 MCG/ML IV SOLN
INTRAVENOUS | Status: AC
  Administered 2016-05-29: 18:00:00 via INTRAVENOUS
  Filled 2016-05-29: qty 1560

## 2016-05-29 MED ORDER — FAT EMULSION 20 % IV EMUL
240.0000 mL | INTRAVENOUS | Status: AC
  Administered 2016-05-29: 240 mL via INTRAVENOUS
  Filled 2016-05-29: qty 250

## 2016-05-29 NOTE — Progress Notes (Signed)
CSW will continue to follow for eventual return to [facility] when pt is medically stable  Jorge Ny, [place] Social Worker [phone]

## 2016-05-29 NOTE — Progress Notes (Signed)
[facility] Progress Note  Patient: [name] [name] [id number]   PCP: [name], MD DOB: Mar 09, 1935   DOA: 05/16/2016   DOS: 05/29/2016   Date of Service: the patient was seen and examined on 05/29/2016  Subjective: Patient is AAOX3, afebrile, denies CP and SOB.  Brief hospital course: Pt. with PMH of type II DM, GERD, diverticulosis, HTN; admitted on 04/24/2016, with complaint of abdominal pain, shortness of breath and fever, was found to have fungemia. 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 Currently further plan is improvement in nutritional status before surgery. Continue TPN. SNF once medically stable. Might be important to discussed directive orders and GOC in case patient declines further.  Assessment and Plan: Sepsis Secondary to Fungemia (positive Candida glabrata) -currently afebrile and responding to therapy -will complete a total of 14 days of Anidulafungin per ID notes -continue supportive care  Gastric tumor Stomach/pyloric channel mass- SUSPICIOUS FOR ADENOCARCINOMA -CCS recommends gastrojejunal bypass and not a gastrectomy. -Dr. Posey Pronto discussed with GI multiple times, GI recommended that surgery to obtain a wedge biopsy of the patient gastric mass. There is no plans for further EGDs. - Failed attempt at placement for Postpyloric tube by IR due to obstruction - Await improvement in her nutritional status prior to surgery. -continue TPN  Hyponatremia likely acute in nature -Discussed with the pharmacy who is providing patient TPN. They will help adjusting electrolytes in her infusions. -Continue to monitor sodium -improving/stable currently 128/129  Severe Protein calorie malnutrition. -In the setting of gastric cancer causing prolonged decrease oral intake  -Currently on TPN. -Pharmacy monitoring and adjusting as needed   Acute hypoxic respiratory failure / AMS -will  continue PRN O2 supplementation -will repeat CXR in am -patient wheezing slightly today; will use duoneb -most likely sundowning; will follow response, constant reorientation and supportive care  Acute GI bleed / esophageal necrosis / gastric outlet obstruction with bowel ulceration -EGD: Gastric tumor, esophagitis Currently w/o sign sof active bleeding  -plan is for surgery at some point later this week  Hypokalemia -continue repletion as needed   Hypomagnesemia Continue repletion as needed   Normocytic anemia(Baseline Hg ~9.0) -stable, continue to monitor  HTN -stable -continue Hydralazine PRN -Echocardiogram: EF 123456, diastolic dysfunction  DM type 2 controlled without complication -[id number] Hemoglobin A1c = 6.6 -continue SSI   Bowel regimen: last BM in 05/18/2016 Diet: Nothing by mouth, continue TPN feeding DVT Prophylaxis: SCD's  Advance goals of care discussion: DNR/DNI  Family Communication: discussed over the phone with daughter on 05/28/16. Not family at bedside today.   Disposition:  Discharge to SNF when medically stable.  Consultants: Gen. surgery, gastroenterology and pharmacy  Procedures: TPN  Antibiotics: Anti-infectives    Start     Dose/Rate Route Frequency Ordered Stop   05/18/16 1700  anidulafungin ([name]) 100 mg in sodium chloride 0.9 % 100 mL IVPB     100 mg over 90 Minutes Intravenous Every 24 hours 04/25/2016 1627 05/30/16 2359   05/14/2016 1700  anidulafungin ([name]) 200 mg in sodium chloride 0.9 % 200 mL IVPB     200 mg over 180 Minutes Intravenous  Once 05/13/2016 1627 04/26/2016 2003   05/16/16 0200  vancomycin (VANCOCIN) IVPB 750 mg/150 ml premix  Status:  Discontinued     750 mg 150 mL/hr over 60 Minutes Intravenous Every 12 hours 05/05/2016 1409 05/16/16 1045   05/15/16 1400  fluconazole (DIFLUCAN) IVPB 100 mg  Status:  Discontinued     100 mg 50 mL/hr over 60 Minutes Intravenous Every 24 hours 04/24/2016 1317 05/10/2016 1653   04/25/2016 1400   vancomycin (VANCOCIN) IVPB 1000 mg/200 mL premix  Status:  Discontinued     1,000 mg 200 mL/hr over 60 Minutes Intravenous  Once 04/26/2016 1353 04/26/2016 1407   05/02/2016 1330  vancomycin (VANCOCIN) IVPB 1000 mg/200 mL premix     1,000 mg 200 mL/hr over 60 Minutes Intravenous  Once 05/06/2016 1325 04/28/2016 1525   04/25/2016 1300  ceFEPIme (MAXIPIME) 2 g in dextrose 5 % 50 mL IVPB  Status:  Discontinued     2 g 100 mL/hr over 30 Minutes Intravenous Every 12 hours 05/04/2016 1257 05/16/16 1043        Intake/Output Summary (Last 24 hours) at 05/29/16 1900 Last data filed at 05/29/16 1836  Gross per 24 hour  Intake             1625 ml  Output              250 ml  Net             1375 ml   Filed Weights   05/25/16 0555 05/27/16 0700 05/28/16 0604  Weight: 87.6 kg (193 lb 2 oz) 83 kg (182 lb 15.7 oz) 83.6 kg (184 lb 3.2 oz)    Objective: Physical Exam: Vitals:   05/29/16 0930 05/29/16 1000 05/29/16 1413 05/29/16 1645  BP: (!) 186/100 (!) 152/72 (!) 109/47 (!) 125/52  Pulse: (!) 119 (!) 123 (!) 101   Resp: (!) 24  17   Temp: 98.8 F (37.1 C)  98.8 F (37.1 C)   TempSrc: Oral  Oral   SpO2: 93%  99%   Weight:      Height:        General: Alert, Awake and Oriented to person and place only. Patient able to move four limbs spontaneously and no having focal deficit. Per records review has experienced episodes of sundowning in the past. Some mild SOB appreciated on exam.  Eyes: PERRL, Conjunctiva normal, no icterus ENT: Oral Mucosa clear moist; no thrush  Neck: no bruits, no lumps. Unable to properly assess for JVD's due to body habitus Cardiovascular: S1 and S2 Present, positive systolic Murmur, Respiratory: Bilateral Air entry equal and Decreased at the basis, no Crackles, mild exp wheezes Abdomen: Bowel Sound present, Soft and no tenderness Skin: no redness, no Rash  Extremities: no Pedal edema, no calf tenderness Neurologic: Grossly no focal neuro deficit. Bilaterally Equal motor  strength  Data Reviewed: CBC:  Recent Labs Lab 05/24/16 0603 05/26/16 0530 05/28/16 0430  WBC 5.3 5.7 4.3  NEUTROABS  --  3.4 2.4  HGB 10.6* 10.3* 9.8*  HCT 32.6* 31.8* 30.7*  MCV 82.1 83.0 82.5  PLT 220 241 0000000   Basic Metabolic Panel:  Recent Labs Lab 05/24/16 0603 05/25/16 0214  05/26/16 0530 05/27/16 0920 05/27/16 2005 05/28/16 0430 05/29/16 0415  NA 129* 128*  < > 128*  128* 127* 125* 129* 128*  K 4.3 5.1  --  4.5 4.3 4.6 4.4 3.9  CL 99* 100*  --  99* 99* 97* 99* 98*  CO2 23 21*  --  24 24 23 25 25   GLUCOSE 99 107*  --  122* 125* 131* 102* 116*  BUN 27* 28*  --  31* 28* 26* 26* 24*  CREATININE 0.69 0.76  --  0.72 0.71 0.63 0.65 0.60  CALCIUM 8.0* 8.0*  --  8.2* 8.0* 7.9* 8.0* 8.1*  MG 1.8 2.1  --   --  1.6* 1.6* 1.5* 1.9  PHOS 3.7  --   --   --   --   --  3.5 3.4  < > = values in this interval not displayed.  Liver Function Tests:  Recent Labs Lab 05/24/16 0603 05/28/16 0430  AST 27 28  ALT 19 18  ALKPHOS 78 80  BILITOT 0.6 0.5  PROT 4.7* 4.8*  ALBUMIN 1.7* 1.7*   CBG:  Recent Labs Lab 05/29/16 0051 05/29/16 0405 05/29/16 0800 05/29/16 1137 05/29/16 1725  GLUCAP 143* 114* 120* 142* 154*    Studies: No results found.   Scheduled Meds: . anidulafungin  100 mg Intravenous Q24H  . antiseptic oral rinse  7 mL Mouth Rinse q12n4p  . chlorhexidine  15 mL Mouth Rinse BID  . insulin aspart  0-15 Units Subcutaneous Q4H  . phenol  1 spray Mouth/Throat BID  . sodium chloride flush  10-40 mL Intracatheter Q12H  . sodium chloride flush  3 mL Intravenous Q12H   Continuous Infusions: . Marland KitchenTPN (CLINIMIX-E) Adult 65 mL/hr at 05/29/16 1759   And  . fat emulsion 240 mL (05/29/16 1759)   PRN Meds: acetaminophen, hydrALAZINE, ipratropium-albuterol, menthol-cetylpyridinium, ondansetron, phenol, sodium chloride flush, sodium chloride flush  Time spent: 30 minutes  Author: Barton Dubois MD [facility] Pager: [phone] 05/29/2016 7:00  PM  If 7PM-7AM, please contact night-coverage at [email or web address], password [facility]

## 2016-05-29 NOTE — Progress Notes (Signed)
12 Days Post-Op  Subjective: Very frail, will not answer questions or respond to me being in the room.  She feels warm. Coughing some.  Abdomen is not tender.  Dr. Dyann Kief says she is sundowning.  Objective: Vital signs in last 24 hours: Temp:  [98 F (36.7 C)-98.8 F (37.1 C)] 98.8 F (37.1 C) (09/12 0930) Pulse Rate:  [85-119] 119 (09/12 0930) Resp:  [18-24] 24 (09/12 0930) BP: (130-186)/(59-100) 186/100 (09/12 0930) SpO2:  [93 %-100 %] 93 % (09/12 0930) Last BM Date: 05/24/16 NPO IV fluids 1900 Urine x 4 recorded Stool x 1 recorded Afebrile, VSS NA 128;  K+ and Mag replaced No films  Intake/Output from previous day: 09/11 0701 - 09/12 0700 In: 1907.5 [I.V.:1777.5; IV Piggyback:130] Out: -  Intake/Output this shift: Total I/O In: 0  Out: 250 [Urine:250]  General appearance: Not responding to me this AM  Opens eyes, but not responding.   GI: soft, non-tender; bowel sounds normal; no masses,  no organomegaly  Lab Results:   Recent Labs  05/28/16 0430  WBC 4.3  HGB 9.8*  HCT 30.7*  PLT 204    BMET  Recent Labs  05/28/16 0430 05/29/16 0415  NA 129* 128*  K 4.4 3.9  CL 99* 98*  CO2 25 25  GLUCOSE 102* 116*  BUN 26* 24*  CREATININE 0.65 0.60  CALCIUM 8.0* 8.1*   PT/INR No results for input(s): LABPROT, INR in the last 72 hours.   Recent Labs Lab 05/24/16 0603 05/28/16 0430  AST 27 28  ALT 19 18  ALKPHOS 78 80  BILITOT 0.6 0.5  PROT 4.7* 4.8*  ALBUMIN 1.7* 1.7*     Lipase  No results found for: LIPASE   Studies/Results: No results found.  Medications: . anidulafungin  100 mg Intravenous Q24H  . antiseptic oral rinse  7 mL Mouth Rinse q12n4p  . chlorhexidine  15 mL Mouth Rinse BID  . insulin aspart  0-15 Units Subcutaneous Q4H  . phenol  1 spray Mouth/Throat BID  . sodium chloride flush  10-40 mL Intracatheter Q12H  . sodium chloride flush  3 mL Intravenous Q12H   . Marland KitchenTPN (CLINIMIX-E) Adult 65 mL/hr at 05/28/16 1705   And  . fat  emulsion 240 mL (05/28/16 1705)  . Marland KitchenTPN (CLINIMIX-E) Adult  Pepcid in TNA     And  . fat emulsion       AODM DNR/DNI Assessment/Plan Acute GI bleed / esophageal necrosis / gastric outlet obstruction with bowel ulceration EGD 04/22/16  - Dr. Luvenia Starch showed esophageal necrosis and outlet obstruction EGD 05/11/2016 - Severe reflux esophagitis. Biopsied. Rule out malignancy, gastric tumor at the pylorus and in the prepyloric region of the stomach. Biopsied. Gastric outlet obstruction  Pathology:  1. Stomach, biopsy, pyloric channel mass - SUSPICIOUS FOR ADENOCARCINOMA. 2. Esophagus, biopsy - EXUDATE WITH FOCAL NECROSIS CONSISTENT WITH ULCER. - NO MALIGNANCY IDENTIFIED. - PAS STAIN NEGATIVE FOR FUNGUS. Severe Malnutrition - TNA  Feeding tube would not pass beyond obstruction Fungemia -  Hyponatremia Severe protein calorie malnutrition - prealbumin 5.5 05/28/16 Low K+/Mag FEN:  TNA/NPO ID: Anidulafungin day 13  DVT:  Not on heparin since at least 9/3 per [facility] - SCD's  Plan:  The surgery for GOO vs gastric tumor are not the same.  Will discuss with Dr. Excell Seltzer.  She not currently in any condition for any surgery.       LOS: 14 days    Fionn Stracke 05/29/2016 [id number]

## 2016-05-29 NOTE — Progress Notes (Signed)
This note is an error [name]. [name] [name], [place]

## 2016-05-29 NOTE — Progress Notes (Signed)
PARENTERAL NUTRITION CONSULT NOTE - FOLLOW UP  Pharmacy Consult:  TPN Indication:  Gastric Outlet Obstruction  Allergies  Allergen Reactions  . Ace Inhibitors Cough  . Prednisone Other (See Comments)    LETHARGY  . Sitagliptin Other (See Comments)    Noted on [facility]   Patient Measurements: Height: 5\' 2"  (157.5 cm) Weight: 184 lb 3.2 oz (83.6 kg) IBW/kg (Calculated) : 50.1 Adjusted Body Weight: 75 kg Usual Weight: 80 kg  Vital Signs: Temp: 98 F (36.7 C) (09/12 0640) Temp Source: Oral (09/12 0640) BP: 138/81 (09/12 0640) Pulse Rate: 91 (09/12 0640) Intake/Output from previous day: 09/11 0701 - 09/12 0700 In: 1907.5 [I.V.:1777.5; IV Piggyback:130] Out: -  Intake/Output from this shift: No intake/output data recorded.    Insulin Requirements in the past 24 hours:  10 units moderate SSI + 20 units regular insulin in TPN  Assessment: 16 YOF recently admitted to [facility] with gastric outlet obstruction due to necrotic esophagitis and discharged on home Unasyn and TPN to [facility]. Admitted to [facility] 04/19/2016 with shortness of breath, fever, and confusion. PICC line was removed on admission due to concern for line infection - found to have fungemia. She is to continue nutrition support with TPN.  GI: GOO on chronic TPN. Failed attempt to place postpyloric tube by IR due to obstruction. IV Pepcid. Prealbumin 3.5 > 8.1 > 5.5. Bx suspicious for adenocarcinoma with plan for resection and pyloroplasty vs GJ once nutrition status improves Endo: hx DM - CBGs 112-143 on reduced ins in TPN bag Lytes: hyponatremia prior to TPN initiation - TRH requests adding Na to TPN 9/10. Low Na/CL 128/98, Mag up 1.9 s/p repletion Cards: VSS Renal: SCr stable wnl, BUN 24 Hepatobil: LFTs WNL, tbili / TG WNL ID: [name] D#13/14 for C.glabrata. +BCx after PICC placed, repeat BCx negative. Afebrile, WBC WNL Best Practices: Pepcid IV BID, SCDs TPN Access: PICC replaced 05/24/16 TPN start date: on TPN  PTA  TPN at [facility]: (information obtained on 8/30 from [place] - pharmacist at [place]) Clinimix E 5/15 1440 ml + 20% lipids 192 ml 35 units of regular insulin ordered for each bag - per discussion there is question if this was actually being added to each bag at the facility  Current Nutrition:  TPN  Nutritional Goals: 1500-1700 kCal and 70-80 grams of protein per day   Plan: - Continue Clinimix E 5/15 at 65 ml/hr and lipids at 10 ml/hr. TPN provides 1588 kCal and 78 gm of protein per day, meeting 100% of patient's needs. - Per MD request, add 186 mEq NaCL to make Clinimix NS concentration (sodium content 154 mEq/L). F/U Na/CL and adjust as appropriate - Daily multivitamin and trace elements in TPN - Add Pepcid 40mg  to TPN bag - Continue moderate SSI + 20 units insulin in TPN bag - F/U AM labs   Elicia Lamp, PharmD, [facility] Clinical Pharmacist Pager [phone] 05/29/2016 7:44 AM

## 2016-05-29 NOTE — Care Management Note (Signed)
Case Management Note  Patient Details  Name: [name] [name] MRN: [id number] Date of Birth: 30-Jul-1935  Subjective/Objective:                 Pt admitted from [facility]. Fungemia- tx w/ 14d of IV antifungal per ID consult, obstruction of bowel 2/2 to tumor, surgery pending improvement in nutritional status, NPO, TPN- unable to pass feeding tube past obstruction, patient wheezing today per Dr Dyann Kief, concern for third spacing.    Action/Plan:  Anticipate DC to SNF when medically stable.   Expected Discharge Date:  05/19/16               Expected Discharge Plan:  [place] (From [employer])  In-House Referral:  Clinical Social Work  Discharge planning Services  CM Consult  Post Acute Care Choice:    Choice offered to:     DME Arranged:    DME Agency:     HH Arranged:    HH Agency:     Status of Service:  In process, will continue to follow  If discussed at Long Length of Stay Meetings, dates discussed:    Additional Comments:  Carles Collet, RN 05/29/2016, 1:54 PM

## 2016-05-29 NOTE — Progress Notes (Signed)
Nutrition Follow-up  DOCUMENTATION CODES:   Obesity unspecified  INTERVENTION:   -TPN management per pharmacy  NUTRITION DIAGNOSIS:   Inadequate oral intake related to inability to eat as evidenced by NPO status.  Ongoing  GOAL:   Patient will meet greater than or equal to 90% of their needs  Met with TPN  MONITOR:   Diet advancement, Labs, Weight trends, Skin, I & O's  REASON FOR ASSESSMENT:   Consult New TPN/TNA  ASSESSMENT:    81 y.o. Female wiht PMH of degenerative disc disease, diabetes, diverticulosis, GERD, hiatal hernia, HLD, HTN, chronic hyponatremia recently admitted from 04/14/2016 until 04/27/2016 for treatment of acute GI bleed with gastric outlet obstruction, necrotic esophagitis with superficial fungal infection and ulceration with subsequent development of possible right-sided pneumonitis and aspiration pneumonia. Patient was discharged on Unasyn and completed her course on 05/14/2016.[name] Patient with left arm PICC in place since time of discharge for Unasyn and TPN administration. Patient's only complaint at this point time is shortness of breath and fevers. Denies worsening lower extremity edema, chest pain, palpitations, dysuria. Patient unable to provide further history.  Pt transferred from SDU to medical floor on 05/27/16.   Per surgical service notes, biopsies are suggestive of adenocarcinoma. Awaiting family meeting for goals of care.   TF was d/c on 05/25/16 and feeding tube removed due to inability to place tube past obstruction. Pt remains NPO, on TPN. Per pharmacy note, plan to Clinimix E 5/15 at 65 ml/hr and lipids at 10 ml/hr. TPN provides 1588 kcals and 78 grams of protein per day, meeting 100% of patient's needs.  Pt in with nurse tech at time of visit. Case discussed with RN, who reports no family present at time of visit and pt unable to provide hx. She reports plan to continue TPN and no plans for further surgery until nutritional status  improves.   Labs reviewed: Na: 128, CBGS: 114-143.   Diet Order:  Diet NPO time specified ADULT TPN TPN (CLINIMIX-E) Adult TPN (CLINIMIX-E) Adult  Skin:  Reviewed, no issues  Last BM:  05/28/16  Height:   Ht Readings from Last 1 Encounters:  05/08/2016 _0  (1.575 m)    Weight:   Wt Readings from Last 1 Encounters:  05/28/16 184 lb 3.2 oz (83.6 kg)    Ideal Body Weight:  50 kg  BMI:  Body mass index is 33.69 kg/m.  Estimated Nutritional Needs:   Kcal:  1500-1700  Protein:  70-80 gm  Fluid:  1.5-1.7 L  EDUCATION NEEDS:   No education needs identified at this time  Almyra Birman A. Jimmye Norman, RD, LDN, CDE Pager: [phone] After hours Pager: [phone]

## 2016-05-29 NOTE — Progress Notes (Signed)
Pts BP elevated. 186/100, then 183/105. PRN hydralazine given per order.

## 2016-05-30 ENCOUNTER — Inpatient Hospital Stay (HOSPITAL_COMMUNITY): Payer: Medicare Other

## 2016-05-30 LAB — GLUCOSE, CAPILLARY
GLUCOSE-CAPILLARY: 131 mg/dL — AB (ref 65–99)
GLUCOSE-CAPILLARY: 146 mg/dL — AB (ref 65–99)
Glucose-Capillary: 90 mg/dL (ref 65–99)
Glucose-Capillary: 129 mg/dL — ABNORMAL HIGH (ref 65–99)
Glucose-Capillary: 120 mg/dL — ABNORMAL HIGH (ref 65–99)
Glucose-Capillary: 139 mg/dL — ABNORMAL HIGH (ref 65–99)

## 2016-05-30 LAB — BASIC METABOLIC PANEL
ANION GAP: 5 (ref 5–15)
BUN: 29 mg/dL — ABNORMAL HIGH (ref 6–20)
CALCIUM: 7.8 mg/dL — AB (ref 8.9–10.3)
CO2: 23 mmol/L (ref 22–32)
Chloride: 103 mmol/L (ref 101–111)
Creatinine, Ser: 0.65 mg/dL (ref 0.44–1.00)
GLUCOSE: 106 mg/dL — AB (ref 65–99)
POTASSIUM: 4.3 mmol/L (ref 3.5–5.1)
SODIUM: 131 mmol/L — AB (ref 135–145)

## 2016-05-30 LAB — CANCER ANTIGEN 19-9: CA 19-9: 18 U/mL (ref 0–35)

## 2016-05-30 LAB — MAGNESIUM: MAGNESIUM: 1.7 mg/dL (ref 1.7–2.4)

## 2016-05-30 LAB — PHOSPHORUS: Phosphorus: 3.2 mg/dL (ref 2.5–4.6)

## 2016-05-30 LAB — CEA: CEA: 2.4 ng/mL (ref 0.0–4.7)

## 2016-05-30 MED ORDER — SODIUM CHLORIDE 0.9 % IV SOLN
INTRAVENOUS | Status: DC
  Administered 2016-05-30 – 2016-06-10 (×5): via INTRAVENOUS

## 2016-05-30 MED ORDER — FAT EMULSION 20 % IV EMUL
234.0000 mL | INTRAVENOUS | Status: AC
  Administered 2016-05-30: 234 mL via INTRAVENOUS
  Filled 2016-05-30: qty 250

## 2016-05-30 MED ORDER — TRACE MINERALS CR-CU-MN-SE-ZN 10-1000-500-60 MCG/ML IV SOLN
INTRAVENOUS | Status: AC
  Administered 2016-05-30: 17:00:00 via INTRAVENOUS
  Filled 2016-05-30: qty 1556

## 2016-05-30 MED ORDER — FUROSEMIDE 10 MG/ML IJ SOLN
40.0000 mg | Freq: Once | INTRAMUSCULAR | Status: AC
  Administered 2016-05-30: 40 mg via INTRAVENOUS
  Filled 2016-05-30 (×2): qty 4

## 2016-05-30 NOTE — Progress Notes (Signed)
Patient ID: [name] [name], female   DOB: 1935-08-17, 81 y.o.   MRN: [id number]  [facility] Progress Note  13 Days Post-Op  Subjective: No complaints today. Appears tired but is alert and answering questions. About to work with Pt. Denies abdominal pain, nausea, vomiting.  Objective: Vital signs in last 24 hours: Temp:  [97.7 F (36.5 C)-100.2 F (37.9 C)] 97.7 F (36.5 C) (09/13 0429) Pulse Rate:  [72-101] 72 (09/13 1020) Resp:  [17-18] 18 (09/13 0429) BP: (109-152)/(47-61) 128/54 (09/13 0429) SpO2:  [95 %-100 %] 97 % (09/13 1020) Last BM Date: 05/29/16  Intake/Output from previous day: 09/12 0701 - 09/13 0700 In: 1512.5 [I.V.:1382.5; IV Piggyback:130] Out: 450 [Urine:450] Intake/Output this shift: No intake/output data recorded.  PE: Gen: alert but not oriented, NAD, answers questions Pulm: CTAB Abd: Soft, NT,mild distention,+BS   Lab Results:   Recent Labs  05/28/16 0430  WBC 4.3  HGB 9.8*  HCT 30.7*  PLT 204   BMET  Recent Labs  05/29/16 0415 05/30/16 0400  NA 128* 131*  K 3.9 4.3  CL 98* 103  CO2 25 23  GLUCOSE 116* 106*  BUN 24* 29*  CREATININE 0.60 0.65  CALCIUM 8.1* 7.8*   PT/INR No results for input(s): LABPROT, INR in the last 72 hours. CMP     Component Value Date/Time   NA 131 (L) 05/30/2016 0400   K 4.3 05/30/2016 0400   CL 103 05/30/2016 0400   CO2 23 05/30/2016 0400   GLUCOSE 106 (H) 05/30/2016 0400   BUN 29 (H) 05/30/2016 0400   CREATININE 0.65 05/30/2016 0400   CREATININE 0.78 11/07/2012 1457   CALCIUM 7.8 (L) 05/30/2016 0400   PROT 4.8 (L) 05/28/2016 0430   ALBUMIN 1.7 (L) 05/28/2016 0430   AST 28 05/28/2016 0430   ALT 18 05/28/2016 0430   ALKPHOS 80 05/28/2016 0430   BILITOT 0.5 05/28/2016 0430   GFRNONAA >60 05/30/2016 0400   GFRAA >60 05/30/2016 0400   Lipase  No results found for: LIPASE     Studies/Results: Dg Chest 2 View  Result Date: 05/30/2016 CLINICAL DATA:  Shortness of breath.  EXAM: CHEST  2 VIEW COMPARISON:  One-view chest x-ray 05/24/2016 FINDINGS: The heart is enlarged. Atherosclerotic changes are present at the aortic arch. Previously noted lines and tubes have been removed. A right-sided PICC line is in place. The tip is in the distal SVC. New left lower lobe airspace disease is present. Bilateral effusions are noted, left greater than right. There is no significant edema or focal airspace disease otherwise. The visualized soft tissues and bony thorax are otherwise unremarkable. IMPRESSION: 1. Left lower lobe airspace disease is concerning for aspiration or pneumonia. 2. Left greater right pleural effusion. 3. Cardiomegaly without edema. 4. Atherosclerosis of the thoracic aorta. 5. Right-sided PICC line. Electronically Signed   By: San Morelle M.D.   On: 05/30/2016 08:33    Anti-infectives: Anti-infectives    Start     Dose/Rate Route Frequency Ordered Stop   05/18/16 1700  anidulafungin ([name]) 100 mg in sodium chloride 0.9 % 100 mL IVPB     100 mg over 90 Minutes Intravenous Every 24 hours 05/03/2016 1627 05/30/16 2359   05/03/2016 1700  anidulafungin ([name]) 200 mg in sodium chloride 0.9 % 200 mL IVPB     200 mg over 180 Minutes Intravenous  Once 04/25/2016 1627 05/07/2016 2003   05/16/16 0200  vancomycin (VANCOCIN) IVPB 750 mg/150 ml premix  Status:  Discontinued  750 mg 150 mL/hr over 60 Minutes Intravenous Every 12 hours 04/26/2016 1409 05/16/16 1045   05/02/2016 1400  fluconazole (DIFLUCAN) IVPB 100 mg  Status:  Discontinued     100 mg 50 mL/hr over 60 Minutes Intravenous Every 24 hours 04/22/2016 1317 05/02/2016 1653   04/23/2016 1400  vancomycin (VANCOCIN) IVPB 1000 mg/200 mL premix  Status:  Discontinued     1,000 mg 200 mL/hr over 60 Minutes Intravenous  Once 05/06/2016 1353 05/13/2016 1407   04/25/2016 1330  vancomycin (VANCOCIN) IVPB 1000 mg/200 mL premix     1,000 mg 200 mL/hr over 60 Minutes Intravenous  Once 05/05/2016 1325 04/20/2016 1525   04/19/2016 1300   ceFEPIme (MAXIPIME) 2 g in dextrose 5 % 50 mL IVPB  Status:  Discontinued     2 g 100 mL/hr over 30 Minutes Intravenous Every 12 hours 05/08/2016 1257 05/16/16 1043       Assessment/Plan Gastric outlet obstruction, gastric tumor at pylorus and in prepyloric region of the stomach- SUSPICIOUS FOR ADENOCARCINOMA, esophagitis -awaiting improved nutritional status prior to surgery -prealbumin down to 5.5 on 05/28/16 -failed attempt by IR to reposition postpyloric tube. -continue TPN -continue pepcid BID Sepsis secondary to fungemia (positive candida glabrata) -cxr improving. On anidulafungin Chronic Hyponatremia - continue to monitor, slightly improved today 131 from 128, pharmacy monitoring Hypokalemia - stable, 4.3 today Hypomagnesemia - resolved, 1.7 today Normocytic anemia -stable, Hemoglobin 9.8 Severe Protein Calorie Malnutrition - prealbumin 5.5 HTN DM2   VTE - heparin FEN - NPO, TPN  Plan - Despite TNA nutritional status not improving, would need to improve prior to gastrojejunostomy for bypass. Need to call family to discuss goals of treatment (daughter [name] [name] (h) [phone] (m) [phone])   LOS: 79 days    Jerrye Beavers , [facility] Surgery 05/30/2016, 10:55 AM Pager: [phone] Consults: [phone] Mon-Fri 7:00 am-4:30 pm Sat-Sun 7:00 am-11:30 am

## 2016-05-30 NOTE — Progress Notes (Signed)
Verbal order obtained from MD [facility] for [facility].

## 2016-05-30 NOTE — Progress Notes (Signed)
[facility] Progress Note  Patient: [name] [name] [id number]   PCP: [name], MD DOB: [birth year]   DOA: 05/06/2016   DOS: 05/30/2016   Date of Service: the patient was seen and examined on 05/30/2016  Subjective: Patient is AAOX3, afebrile, denies CP and SOB.  Brief hospital course:  81 y/o ? type II DM,  GERD,  Diverticulosis,  HTN; admitted initially 04/15/16 with dark stools-work-up showed acute esophageal ncrosis + partial Gastric Outlet obs + pyloric mass She had a rpt EGD on 04/22/16 which confirmed no malignant cellson 04/22/2016, CC abdominal pain, shortness of breath and fever,  Re-admitted 05/16/15 sepsis and AMS-Tmax 104.5 Rpt EGD showed severe reflux + Candidal fungemia.  Patient has a gastric outlet obstruction caused by tumor and plan is to have surgery operate on her  Currently further plan is improvement in nutritional status before surgery.  OnTPN.  SNF once medically stable.  Might be important to discussed directive orders and GOC in case patient declines further.  Assessment and Plan: Sepsis Secondary to Fungemia (positive Candida glabrata) -currently afebrile and responding to therapy -will complete a total of 14 days of Anidulafungin per ID notes as on 05/30/16 -continue supportive care  Gastric tumor Stomach/pyloric channel mass- SUSPICIOUS FOR ADENOCARCINOMA -CCS recommends gastrojejunal bypass and not a gastrectomy. -Dr. Posey Pronto discussed with GI multiple times, GI recommended that surgery to obtain a wedge biopsy of the patient gastric mass. There is no plans for further EGDs. - Failed attempt at placement for Postpyloric tube by IR due to obstruction - unclear current goals of care and direction -will d/w Gen surgery further -continue TPN for now but no apparent real change in nutrition and might not be good surgical candidate  Hyponatremia likely acute in nature -Discussed with the pharmacy who is providing patient TPN. They will help  adjusting electrolytes in her infusions. -Continue to monitor sodium -improving/stable currently 131  Severe Protein calorie malnutrition. -In the setting of gastric cancer causing prolonged decrease oral intake  -Currently on TPN. -Pharmacy monitoring and adjusting as needed   Acute hypoxic respiratory failure / AMS Alos devleoping L sided Infiltrate based on 2 vw CXR 05/30/16 -will continue PRN O2 supplementation -no white count-suspect more fluid, trial 1x dose lasix 40 iv -patient cough and wheeze still slight -most likely sundowning; will follow response, constant reorientation and supportive care  Acute GI bleed / esophageal necrosis / gastric outlet obstruction with bowel ulceration -EGD: Gastric tumor, esophagitis Currently w/o sign sof active bleeding  - defer to surgery further plans Hypokalemia -continue repletion as needed   Hypomagnesemia Continue repletion as needed   Normocytic anemia(Baseline Hg ~9.0) -stable, continue to monitor  HTN -stable -continue Hydralazine PRN -Echocardiogram: EF [id number], diastolic dysfunction  DM type 2 controlled without complication -[id number] Hemoglobin A1c = 6.6 -continue SSI , sugars 90-130's  Bowel regimen: last BM in 05/18/2016 Diet: Nothing by mouth, continue TPN feeding DVT Prophylaxis: SCD's  Advance goals of care discussion: DNR/DNI Unclear further dispo-patient doesn;t appear to be best candidate for surgery and might not be able to withstand a major operation I have been in contact with surgical team and await further input regarding dispo moving forward Will require continue din-Hospital stay and stabilization and determination of further goals of care with family  Family Communication: discussed over the phone with daughter on 05/28/16. Not family at bedside today.   Disposition:  Discharge to SNF probably in 24-48 hrs  Consultants: Gen. surgery, gastroenterology and pharmacy  Procedures:  TPN  Antibiotics: Anti-infectives    Start     Dose/Rate Route Frequency Ordered Stop   05/18/16 1700  anidulafungin ([name]) 100 mg in sodium chloride 0.9 % 100 mL IVPB     100 mg over 90 Minutes Intravenous Every 24 hours 05/15/2016 1627 05/30/16 2359   04/25/2016 1700  anidulafungin ([name]) 200 mg in sodium chloride 0.9 % 200 mL IVPB     200 mg over 180 Minutes Intravenous  Once 05/13/2016 1627 05/08/2016 2003   05/16/16 0200  vancomycin (VANCOCIN) IVPB 750 mg/150 ml premix  Status:  Discontinued     750 mg 150 mL/hr over 60 Minutes Intravenous Every 12 hours 04/17/2016 1409 05/16/16 1045   04/20/2016 1400  fluconazole (DIFLUCAN) IVPB 100 mg  Status:  Discontinued     100 mg 50 mL/hr over 60 Minutes Intravenous Every 24 hours 05/02/2016 1317 05/04/2016 1653   05/08/2016 1400  vancomycin (VANCOCIN) IVPB 1000 mg/200 mL premix  Status:  Discontinued     1,000 mg 200 mL/hr over 60 Minutes Intravenous  Once 05/17/2016 1353 04/28/2016 1407   05/11/2016 1330  vancomycin (VANCOCIN) IVPB 1000 mg/200 mL premix     1,000 mg 200 mL/hr over 60 Minutes Intravenous  Once 04/18/2016 1325 05/12/2016 1525   05/08/2016 1300  ceFEPIme (MAXIPIME) 2 g in dextrose 5 % 50 mL IVPB  Status:  Discontinued     2 g 100 mL/hr over 30 Minutes Intravenous Every 12 hours 05/14/2016 1257 05/16/16 1043        Intake/Output Summary (Last 24 hours) at 05/30/16 0737 Last data filed at 05/30/16 0300  Gross per 24 hour  Intake           1512.5 ml  Output              450 ml  Net           1062.5 ml   Filed Weights   05/25/16 0555 05/27/16 0700 05/28/16 0604  Weight: 87.6 kg (193 lb 2 oz) 83 kg (182 lb 15.7 oz) 83.6 kg (184 lb 3.2 oz)    Objective: Physical Exam: Vitals:   05/29/16 2041 05/29/16 2059 05/29/16 2300 05/30/16 0429  BP: (!) 152/61   (!) 128/54  Pulse: 98   92  Resp: 18   18  Temp: 99.2 F (37.3 C) 100.2 F (37.9 C) 99 F (37.2 C) 97.7 F (36.5 C)  TempSrc: Oral Rectal Axillary Oral  SpO2: 100%   95%  Weight:       Height:        General: Alert, Awake and Oriented to person and place only. Patient able to move four limbs spontaneously and no having focal deficit.Somecoughing at the bedside Eyes: PERRL, Conjunctiva normal, no icterus ENT: Oral Mucosa clear moist; no thrush  Neck: no bruits, no lumps. Unable to properly assess for JVD's due to body habitus Cardiovascular: S1 and S2 Present, positive systolic Murmur, Respiratory: Bilateral Air entry equal and Decreased at the basis, no Crackles Abdomen: Bowel Sound present, Soft and no tenderness Skin: no redness, no Rash  Extremities: no Pedal edema, no calf tenderness Neurologic: Grossly no focal neuro deficit. Bilaterally Equal motor strength  Data Reviewed: CBC:  Recent Labs Lab 05/24/16 0603 05/26/16 0530 05/28/16 0430  WBC 5.3 5.7 4.3  NEUTROABS  --  3.4 2.4  HGB 10.6* 10.3* 9.8*  HCT 32.6* 31.8* 30.7*  MCV 82.1 83.0 82.5  PLT 220 241 0000000   Basic Metabolic Panel:  Recent Labs  Lab 05/24/16 0603  05/27/16 0920 05/27/16 2005 05/28/16 0430 05/29/16 0415 05/30/16 0400  NA 129*  < > 127* 125* 129* 128* 131*  K 4.3  < > 4.3 4.6 4.4 3.9 4.3  CL 99*  < > 99* 97* 99* 98* 103  CO2 23  < > 24 23 25 25 23   GLUCOSE 99  < > 125* 131* 102* 116* 106*  BUN 27*  < > 28* 26* 26* 24* 29*  CREATININE 0.69  < > 0.71 0.63 0.65 0.60 0.65  CALCIUM 8.0*  < > 8.0* 7.9* 8.0* 8.1* 7.8*  MG 1.8  < > 1.6* 1.6* 1.5* 1.9 1.7  PHOS 3.7  --   --   --  3.5 3.4 3.2  < > = values in this interval not displayed.  Liver Function Tests:  Recent Labs Lab 05/24/16 0603 05/28/16 0430  AST 27 28  ALT 19 18  ALKPHOS 78 80  BILITOT 0.6 0.5  PROT 4.7* 4.8*  ALBUMIN 1.7* 1.7*   CBG:  Recent Labs Lab 05/29/16 1137 05/29/16 1725 05/29/16 2039 05/30/16 0109 05/30/16 0426  GLUCAP 142* 154* 124* 90 129*    Studies: No results found.   Scheduled Meds: . anidulafungin  100 mg Intravenous Q24H  . antiseptic oral rinse  7 mL Mouth Rinse q12n4p  .  chlorhexidine  15 mL Mouth Rinse BID  . insulin aspart  0-15 Units Subcutaneous Q4H  . phenol  1 spray Mouth/Throat BID  . sodium chloride flush  10-40 mL Intracatheter Q12H  . sodium chloride flush  3 mL Intravenous Q12H   Continuous Infusions: . Marland KitchenTPN (CLINIMIX-E) Adult 65 mL/hr at 05/29/16 1759   And  . fat emulsion 240 mL (05/29/16 1759)   PRN Meds: acetaminophen, hydrALAZINE, ipratropium-albuterol, menthol-cetylpyridinium, ondansetron, phenol, sodium chloride flush, sodium chloride flush  Time spent: 30 minutes  Verneita Griffes, MD [facility] ([phone]  If 7PM-7AM, please contact night-coverage at [email or web address], password [facility]

## 2016-05-30 NOTE — Progress Notes (Signed)
Called daughter, long chat about some options Explained palliative procedures acting a sbridge Explained probably not a good candidate overall for any procedure, but will defer detailed discussion to Gen surg

## 2016-05-30 NOTE — Progress Notes (Signed)
Physical Therapy Treatment Patient Details Name: [name] [name] MRN: [id number] DOB: 06-17-1935 Today's Date: 05/30/2016    History of Present Illness 81 y.o.femalewith history of degenerative disc disease, DM, diverticulosis, GERD, hiatal hernia, HLD, HTN, and chronic hyponatremia who was admitted 04/14/2016 >04/27/2016 for treatment of acute GI bleed with gastric outlet obstruction due to necrotic esophagitis with superficial fungal infection and ulceration with subsequent development of possible right-sided pneumonitis and aspiration pneumonia. She returned to the ED c/o shortness of breath, fevers, and confusion. +sepsis (?due to PICC)    PT Comments    Patient is making gradual progress toward mobility goals. Current plan remains appropriate.   Follow Up Recommendations  SNF     Equipment Recommendations  None recommended by PT    Recommendations for Other Services OT consult     Precautions / Restrictions Precautions Precautions: Fall Precaution Comments: central IJ for TPN; peripheral IV; O2 Restrictions Weight Bearing Restrictions: No    Mobility  Bed Mobility Overal bed mobility: Needs Assistance Bed Mobility: Rolling;Sidelying to Sit Rolling: Min guard Sidelying to sit: Min assist;HOB elevated       General bed mobility comments: cues for hand placement with use of rail and for sequencing/technique; assist to elevate trunk into sitting  Transfers Overall transfer level: Needs assistance Equipment used: Rolling walker (2 wheeled) Transfers: Sit to/from Stand Sit to Stand: Min assist         General transfer comment: cues for hand placement; assis to power up into standing from EOB and recliner with less assist needed from recliner  Ambulation/Gait Ambulation/Gait assistance: Min guard Ambulation Distance (Feet):  (30X2) Assistive device: Rolling walker (2 wheeled) Gait Pattern/deviations: Step-to pattern;Decreased stance time - left;Decreased  step length - right;Antalgic Gait velocity: decreased   General Gait Details: seated rest break required; cues for posture; limited due to L knee pain; audible crepitus with ambulation   Stairs            Wheelchair Mobility    Modified Rankin (Stroke Patients Only)       Balance Overall balance assessment: Needs assistance   Sitting balance-Leahy Scale: Fair       Standing balance-Leahy Scale: Poor                      Cognition Arousal/Alertness: Awake/alert Behavior During Therapy: WFL for tasks assessed/performed Overall Cognitive Status: Within Functional Limits for tasks assessed Area of Impairment: Orientation                    Exercises      General Comments        Pertinent Vitals/Pain Pain Assessment: Faces Faces Pain Scale: Hurts little more Pain Location: L knee Pain Descriptors / Indicators: Sore Pain Intervention(s): Limited activity within patient's tolerance;Monitored during session;Repositioned    Home Living                      Prior Function            PT Goals (current goals can now be found in the care plan section) Acute Rehab PT Goals Patient Stated Goal: get better Progress towards PT goals: Progressing toward goals    Frequency  Min 2X/week    PT Plan Current plan remains appropriate    Co-evaluation             End of Session Equipment Utilized During Treatment: Gait belt Activity Tolerance: Patient limited by pain Patient left: in  chair;with call bell/phone within reach;with chair alarm set     Time: [phone] PT Time Calculation (min) (ACUTE ONLY): 38 min  Charges:  $Gait Training: 23-37 mins $Therapeutic Activity: 8-22 mins                    G Codes:      Salina April, PTA Pager: [phone]   05/30/2016, 1:37 PM

## 2016-05-30 NOTE — Progress Notes (Signed)
Pt temp 100.2 at 2059, Tylenol given, MD notified.

## 2016-05-30 NOTE — Progress Notes (Signed)
PARENTERAL NUTRITION CONSULT NOTE - FOLLOW UP  Pharmacy Consult:  TPN Indication:  Gastric Outlet Obstruction  Allergies  Allergen Reactions  . Ace Inhibitors Cough  . Prednisone Other (See Comments)    LETHARGY  . Sitagliptin Other (See Comments)    Noted on [facility]   Patient Measurements: Height: 5\' 2"  (157.5 cm) Weight: 184 lb 3.2 oz (83.6 kg) IBW/kg (Calculated) : 50.1 Adjusted Body Weight: 75 kg Usual Weight: 80 kg  Vital Signs: Temp: 97.7 F (36.5 C) (09/13 0429) Temp Source: Oral (09/13 0429) BP: 128/54 (09/13 0429) Pulse Rate: 92 (09/13 0429) Intake/Output from previous day: 09/12 0701 - 09/13 0700 In: 1512.5 [I.V.:1382.5; IV Piggyback:130] Out: 450 [Urine:450] Intake/Output from this shift: No intake/output data recorded.    Insulin Requirements in the past 24 hours:  9 units moderate SSI + 20 units regular insulin in TPN  Assessment: 87 YOF recently admitted to [facility] with gastric outlet obstruction due to necrotic esophagitis and discharged on home Unasyn and TPN to [facility]. Admitted to [facility] 05/11/2016 with shortness of breath, fever, and confusion. PICC line was removed on admission due to concern for line infection - found to have fungemia. She is to continue nutrition support with TPN.  GI: GOO on chronic TPN.  Pepcid in TPN.  Failed attempt to place postpyloric tube by IR due to obstruction.  Prealbumin 3.5 > 8.1 > 5.5. Bx suspicious for adenocarcinoma with plan for resection and pyloroplasty vs GJ once nutrition status improves Endo: hx DM - CBGs controlled Lytes: hyponatremia prior to TPN initiation - TRH requests adding Na to TPN 9/10 - improved to 131 Cards: VSS Renal: SCr stable, BUN 29 Hepatobil: LFTs WNL, tbili / TG WNL Pulm: RA >> 2L [place] ID: [name] D#[id number]/14 for C.glabrata. +BCx after PICC placed, repeat BCx negative - Tmax 100.2, WBC WNL Best Practices: SCDs, Pepcid TPN Access: PICC replaced 05/24/16 TPN start date: on TPN PTA  TPN at [facility]: (information obtained on 8/30 from [facility] - pharmacist at [place]) Clinimix E 5/15 1440 ml + 20% lipids 192 ml 35 units of regular insulin ordered for each bag - per discussion there is question if this was actually being added to each bag at the facility  Current Nutrition:  TPN  Nutritional Goals: 1500-1700 kCal and 70-80 grams of protein per day   Plan: - Cycle TPN, 1590mL over 18 hrs:  50 ml/hr x 1 hr, then 91 ml/hr x 16 hrs, then 50 ml/hr x 1 hr - Lipids 13 ml/hr x 18 hrs - TPN provides approximately 1588 kCal and 78 gm of protein per day, meeting 100% of patient's needs. - Per MD request, add 185 mEq NaCL to make Clinimix NS concentration (sodium content 154 mEq/L). - Daily multivitamin and trace elements in TPN - Pepcid 40mg  in TPN - Continue moderate SSI Q4H + reduce insulin in TPN to 15 units - F/U AM labs and [facility] D. Mina Marble, PharmD, BCPS Pager:  [phone] 05/30/2016, 8:06 AM

## 2016-05-30 NOTE — Care Management Important Message (Signed)
Important Message  Patient Details  Name: [name] [name] MRN: [id number] Date of Birth: 03-04-1935   Medicare Important Message Given:  Yes    Brayam Boeke Abena 05/30/2016, 11:41 AM

## 2016-05-30 NOTE — Progress Notes (Signed)
Pt educated on use and benefits of ICS, verbalizes understanding. ICS at bedside, use 10X per hour. Will continue to monitor.

## 2016-05-31 LAB — COMPREHENSIVE METABOLIC PANEL
ALT: 40 U/L (ref 14–54)
AST: 53 U/L — AB (ref 15–41)
Albumin: 1.8 g/dL — ABNORMAL LOW (ref 3.5–5.0)
Alkaline Phosphatase: 135 U/L — ABNORMAL HIGH (ref 38–126)
Anion gap: 4 — ABNORMAL LOW (ref 5–15)
BUN: 23 mg/dL — AB (ref 6–20)
CHLORIDE: 104 mmol/L (ref 101–111)
CO2: 26 mmol/L (ref 22–32)
CREATININE: 0.68 mg/dL (ref 0.44–1.00)
Calcium: 8 mg/dL — ABNORMAL LOW (ref 8.9–10.3)
GFR calc Af Amer: >60 mL/min (ref >60)
GFR calc non Af Amer: >60 mL/min (ref >60)
Glucose, Bld: 144 mg/dL — ABNORMAL HIGH (ref 65–99)
Potassium: 3.7 mmol/L (ref 3.5–5.1)
SODIUM: 134 mmol/L — AB (ref 135–145)
Total Bilirubin: 0.4 mg/dL (ref 0.3–1.2)
Total Protein: 4.6 g/dL — ABNORMAL LOW (ref 6.5–8.1)

## 2016-05-31 LAB — GLUCOSE, CAPILLARY
GLUCOSE-CAPILLARY: 169 mg/dL — AB (ref 65–99)
GLUCOSE-CAPILLARY: 178 mg/dL — AB (ref 65–99)
Glucose-Capillary: 146 mg/dL — ABNORMAL HIGH (ref 65–99)
Glucose-Capillary: 175 mg/dL — ABNORMAL HIGH (ref 65–99)
Glucose-Capillary: 202 mg/dL — ABNORMAL HIGH (ref 65–99)
Glucose-Capillary: 91 mg/dL (ref 65–99)

## 2016-05-31 LAB — CBC WITH DIFFERENTIAL/PLATELET
BASOS ABS: 0 10*3/uL (ref 0.0–0.1)
Basophils Relative: 0 %
Eosinophils Absolute: 0.1 10*3/uL (ref 0.0–0.7)
Eosinophils Relative: 3 %
HEMATOCRIT: 29 % — AB (ref 36.0–46.0)
Hemoglobin: 9.1 g/dL — ABNORMAL LOW (ref 12.0–15.0)
LYMPHS ABS: 0.8 10*3/uL (ref 0.7–4.0)
LYMPHS PCT: 26 %
MCH: 26 pg (ref 26.0–34.0)
MCHC: 31.4 g/dL (ref 30.0–36.0)
MCV: 82.9 fL (ref 78.0–100.0)
MONO ABS: 0.3 10*3/uL (ref 0.1–1.0)
MONOS PCT: 9 %
NEUTROS ABS: 2 10*3/uL (ref 1.7–7.7)
Neutrophils Relative %: 62 %
PLATELETS: 143 10*3/uL — AB (ref 150–400)
RBC: 3.5 MIL/uL — AB (ref 3.87–5.11)
RDW: 15 % (ref 11.5–15.5)
WBC: 3.2 10*3/uL — ABNORMAL LOW (ref 4.0–10.5)

## 2016-05-31 LAB — PHOSPHORUS: Phosphorus: 3.3 mg/dL (ref 2.5–4.6)

## 2016-05-31 LAB — MAGNESIUM: Magnesium: 1.4 mg/dL — ABNORMAL LOW (ref 1.7–2.4)

## 2016-05-31 MED ORDER — MAGNESIUM SULFATE 50 % IJ SOLN
3.0000 g | Freq: Once | INTRAVENOUS | Status: AC
  Administered 2016-05-31: 3 g via INTRAVENOUS
  Filled 2016-05-31: qty 6

## 2016-05-31 MED ORDER — TRACE MINERALS CR-CU-MN-SE-ZN 10-1000-500-60 MCG/ML IV SOLN
INTRAVENOUS | Status: AC
  Administered 2016-05-31: 18:00:00 via INTRAVENOUS
  Filled 2016-05-31: qty 1556

## 2016-05-31 MED ORDER — FAT EMULSION 20 % IV EMUL
234.0000 mL | INTRAVENOUS | Status: AC
  Administered 2016-05-31: 234 mL via INTRAVENOUS
  Filled 2016-05-31: qty 250

## 2016-05-31 NOTE — Progress Notes (Signed)
[facility] Progress Note  Patient: [name] [name] [id number]   PCP: [name], MD DOB: 07-Feb-1935   DOA: 04/19/2016   DOS: 05/31/2016   Date of Service: the patient was seen and examined on 05/31/2016  Subjective: Patient is AAOX3, afebrile, denies CP and SOB.  Brief hospital course:  81 y/o ? type II DM,  GERD,  Diverticulosis,  HTN; admitted initially 04/15/16 with dark stools-work-up showed acute esophageal ncrosis + partial Gastric Outlet obs + pyloric mass She had a rpt EGD on 04/22/16 which confirmed no malignant cellson 05/01/2016, CC abdominal pain, shortness of breath and fever,  Re-admitted 05/16/15 sepsis and AMS-Tmax 104.5 Rpt EGD showed severe reflux + Candidal fungemia.  Patient has a gastric outlet obstruction caused by tumor and plan is to have surgery operate on her  Currently further plan is improvement in nutritional status before surgery.  OnTPN.  SNF once medically stable.  Might be important to discussed directive orders and GOC in case patient declines further.  Assessment and Plan:  Sepsis Secondary to Fungemia (positive Candida glabrata) -currently afebrile and responding to therapy -will complete a total of 14 days of Anidulafungin per ID notes as on 05/30/16 -continue supportive care  Gastric tumor Stomach/pyloric channel mass- SUSPICIOUS FOR ADENOCARCINOMA -CCS recommends gastrojejunal bypass and not a gastrectomy. -Dr. Posey Pronto discussed with GI multiple times, GI recommended that surgery to obtain a wedge biopsy of the patient gastric mass. There is no plans for further EGDs. - Failed attempt at placement for Postpyloric tube by IR due to obstruction - unclear current goals of care and direction -will d/w Gen surgery further -continue TPN for now but no apparent real change in nutrition and might not be good surgical candidate  Hyponatremia likely acute in nature Hypomagnesemia -Continue to monitor sodium, getting Clinimix concentrate  154 MeQ -improving/stable currently 131-->134  Severe Protein calorie malnutrition. -In the setting of gastric cancer causing prolonged decrease oral intake  -Currently on TPN. -Pharmacy monitoring and adjusting as needed   Acute hypoxic respiratory failure / AMS Alos devleoping L sided Infiltrate based on 2 vw CXR 05/30/16 -will continue PRN O2 supplementation -no white count-suspect more fluid, trial 1x dose lasix 40 iv -patient cough and wheeze still slight and no overt evidence of cardio-resp dysfucntion-montor only right now -most likely sundowning; will follow response, constant reorientation and supportive care  Acute GI bleed / esophageal necrosis / gastric outlet obstruction with bowel ulceration -EGD: Gastric tumor, esophagitis Currently w/o sign sof active bleeding  - defer to surgery further plans  Hypokalemia -continue repletion as needed  -K within range  Hypomagnesemia Continue repletion as needed , might need to replace in TPN  Normocytic anemia(Baseline Hg ~9.0) mildly neutropenic with mild TCP -stable, continue to monitor -PLT have dropped slightly  HTN -stable -continue Hydralazine PRN -Echocardiogram: EF 123456, diastolic dysfunction  DM type 2 controlled without complication -[id number] Hemoglobin A1c = 6.6 -continue SSI , sugars 140-200 rangers  Bowel regimen: last BM in 05/18/2016 Diet: Nothing by mouth, continue TPN feeding DVT Prophylaxis: SCD's  Advance goals of care discussion: Unclear further dispo-patient doesn;t appear to be best candidate for surgery and might not be able to withstand a major operation Surgery to discuss options shortly Did not update family today Had a long chat with patient who is more oriented and states she would like to be FULL CODE-I went over this in detail with her 05/31/16   Consultants: Gen. surgery, gastroenterology and pharmacy  Procedures: TPN  Antibiotics:  Anti-infectives    Start     Dose/Rate Route  Frequency Ordered Stop   05/18/16 1700  anidulafungin ([name]) 100 mg in sodium chloride 0.9 % 100 mL IVPB     100 mg over 90 Minutes Intravenous Every 24 hours 05/04/2016 1627 05/30/16 1858   04/26/2016 1700  anidulafungin ([name]) 200 mg in sodium chloride 0.9 % 200 mL IVPB     200 mg over 180 Minutes Intravenous  Once 04/17/2016 1627 04/19/2016 2003   05/16/16 0200  vancomycin (VANCOCIN) IVPB 750 mg/150 ml premix  Status:  Discontinued     750 mg 150 mL/hr over 60 Minutes Intravenous Every 12 hours 04/21/2016 1409 05/16/16 1045   04/28/2016 1400  fluconazole (DIFLUCAN) IVPB 100 mg  Status:  Discontinued     100 mg 50 mL/hr over 60 Minutes Intravenous Every 24 hours 05/03/2016 1317 05/01/2016 1653   05/04/2016 1400  vancomycin (VANCOCIN) IVPB 1000 mg/200 mL premix  Status:  Discontinued     1,000 mg 200 mL/hr over 60 Minutes Intravenous  Once 05/05/2016 1353 05/04/2016 1407   05/01/2016 1330  vancomycin (VANCOCIN) IVPB 1000 mg/200 mL premix     1,000 mg 200 mL/hr over 60 Minutes Intravenous  Once 05/16/2016 1325 04/18/2016 1525   05/16/2016 1300  ceFEPIme (MAXIPIME) 2 g in dextrose 5 % 50 mL IVPB  Status:  Discontinued     2 g 100 mL/hr over 30 Minutes Intravenous Every 12 hours 04/29/2016 1257 05/16/16 1043        Intake/Output Summary (Last 24 hours) at 05/31/16 1522 Last data filed at 05/31/16 1100  Gross per 24 hour  Intake           1513.1 ml  Output              100 ml  Net           1413.1 ml   Filed Weights   05/28/16 0604 05/30/16 1246 05/31/16 0651  Weight: 83.6 kg (184 lb 3.2 oz) 83.4 kg (183 lb 14.4 oz) 84 kg (185 lb 1.6 oz)    Objective: Physical Exam: Vitals:   05/30/16 1618 05/30/16 2121 05/31/16 0651 05/31/16 1318  BP:  (!) 142/55 (!) 158/73 (!) 156/61  Pulse: 78 87 81 65  Resp:  18 18 19   Temp:  98.1 F (36.7 C) 97.7 F (36.5 C) 98.3 F (36.8 C)  TempSrc:  Oral Oral Oral  SpO2: 95% 96% 98% 97%  Weight:   84 kg (185 lb 1.6 oz)   Height:        General: Alert, Awake and  Oriented  Patient able to move four limbs spontaneously and no having focal deficit.Somecoughing at the bedside Eyes: PERRL, Conjunctiva normal, no icterus ENT: Oral Mucosa clear moist; no thrush  Neck: no bruits, no lumps. Unable to properly assess for JVD's due to body habitus Cardiovascular: S1 and S2 Present, positive systolic Murmur, Respiratory: Bilateral Air entry equal and Decreased at the basis, no Crackles Abdomen: Bowel Sound present, Soft and no tenderness Skin: no redness, no Rash -she has diffuse swellin and body water Extremities: ++ Pedal edema, no calf tenderness Neurologic: Grossly no focal neuro deficit. Bilaterally Equal motor strength  Data Reviewed: CBC:  Recent Labs Lab 05/26/16 0530 05/28/16 0430 05/31/16 0500  WBC 5.7 4.3 3.2*  NEUTROABS 3.4 2.4 2.0  HGB 10.3* 9.8* 9.1*  HCT 31.8* 30.7* 29.0*  MCV 83.0 82.5 82.9  PLT 241 204 [id number]*   Basic Metabolic Panel:  Recent  Labs Lab 05/27/16 2005 05/28/16 0430 05/29/16 0415 05/30/16 0400 05/31/16 0500  NA 125* 129* 128* 131* 134*  K 4.6 4.4 3.9 4.3 3.7  CL 97* 99* 98* 103 104  CO2 23 25 25 23 26   GLUCOSE 131* 102* 116* 106* 144*  BUN 26* 26* 24* 29* 23*  CREATININE 0.63 0.65 0.60 0.65 0.68  CALCIUM 7.9* 8.0* 8.1* 7.8* 8.0*  MG 1.6* 1.5* 1.9 1.7 1.4*  PHOS  --  3.5 3.4 3.2 3.3    Liver Function Tests:  Recent Labs Lab 05/28/16 0430 05/31/16 0500  AST 28 53*  ALT 18 40  ALKPHOS 80 135*  BILITOT 0.5 0.4  PROT 4.8* 4.6*  ALBUMIN 1.7* 1.8*   CBG:  Recent Labs Lab 05/30/16 2021 05/31/16 0012 05/31/16 0423 05/31/16 0747 05/31/16 1210  GLUCAP 139* 202* 146* 169* 178*    Studies: No results found.   Scheduled Meds: . antiseptic oral rinse  7 mL Mouth Rinse q12n4p  . chlorhexidine  15 mL Mouth Rinse BID  . insulin aspart  0-15 Units Subcutaneous Q4H  . phenol  1 spray Mouth/Throat BID  . sodium chloride flush  10-40 mL Intracatheter Q12H  . sodium chloride flush  3 mL Intravenous  Q12H   Continuous Infusions: . Marland KitchenTPN (CLINIMIX-E) Adult     And  . fat emulsion    . sodium chloride 10 mL/hr at 05/30/16 1347   PRN Meds: acetaminophen, hydrALAZINE, ipratropium-albuterol, menthol-cetylpyridinium, ondansetron, phenol, sodium chloride flush, sodium chloride flush  Time spent: 30 minutes  Verneita Griffes, MD [facility] ([phone]  If 7PM-7AM, please contact night-coverage at [email or web address], password [facility]

## 2016-05-31 NOTE — Progress Notes (Signed)
Central tele called RN to inform about 15 beat run of SVT.  RN along with 2 other RNs reviewed strip and did not see SVT. Patient asymptomatic. NP Schorr notified.  Will continue to monitor and notify as needed.

## 2016-05-31 NOTE — Progress Notes (Signed)
14 Days Post-Op  Subjective: Pt talking and ask if we had decided on a plan this AM. No pain or discomfort and she is mentating quite normally this AM  Objective: Vital signs in last 24 hours: Temp:  [97.7 F (36.5 C)-98.1 F (36.7 C)] 97.7 F (36.5 C) (09/14 0651) Pulse Rate:  [72-87] 81 (09/14 0651) Resp:  [16-18] 18 (09/14 0651) BP: (109-158)/(51-73) 158/73 (09/14 0651) SpO2:  [95 %-98 %] 98 % (09/14 0651) Weight:  [83.4 kg (183 lb 14.4 oz)-84 kg (185 lb 1.6 oz)] 84 kg (185 lb 1.6 oz) (09/14 0651) Last BM Date: 05/29/16 NPO 1500 IV fluids Voided x 4 Afebrile, VSS Labs CMP stable, Mag 1.4 WBC, H/H and platelets slowly declining    Intake/Output from previous day: 09/13 0701 - 09/14 0700 In: 1513.1 [I.V.:1513.1] Out: -  Intake/Output this shift: No intake/output data recorded.  General appearance: alert, cooperative and no distress GI: soft, non-tender; bowel sounds normal; no masses,  no organomegaly  Lab Results:   Recent Labs  05/31/16 0500  WBC 3.2*  HGB 9.1*  HCT 29.0*  PLT 143*    BMET  Recent Labs  05/30/16 0400 05/31/16 0500  NA 131* 134*  K 4.3 3.7  CL 103 104  CO2 23 26  GLUCOSE 106* 144*  BUN 29* 23*  CREATININE 0.65 0.68  CALCIUM 7.8* 8.0*   PT/INR No results for input(s): LABPROT, INR in the last 72 hours.   Recent Labs Lab 05/28/16 0430 05/31/16 0500  AST 28 53*  ALT 18 40  ALKPHOS 80 135*  BILITOT 0.5 0.4  PROT 4.8* 4.6*  ALBUMIN 1.7* 1.8*     Lipase  No results found for: LIPASE   Studies/Results: Dg Chest 2 View  Result Date: 05/30/2016 CLINICAL DATA:  Shortness of breath. EXAM: CHEST  2 VIEW COMPARISON:  One-view chest x-ray 05/24/2016 FINDINGS: The heart is enlarged. Atherosclerotic changes are present at the aortic arch. Previously noted lines and tubes have been removed. A right-sided PICC line is in place. The tip is in the distal SVC. New left lower lobe airspace disease is present. Bilateral effusions are  noted, left greater than right. There is no significant edema or focal airspace disease otherwise. The visualized soft tissues and bony thorax are otherwise unremarkable. IMPRESSION: 1. Left lower lobe airspace disease is concerning for aspiration or pneumonia. 2. Left greater right pleural effusion. 3. Cardiomegaly without edema. 4. Atherosclerosis of the thoracic aorta. 5. Right-sided PICC line. Electronically Signed   By: San Morelle M.D.   On: 05/30/2016 08:33    Medications: . antiseptic oral rinse  7 mL Mouth Rinse q12n4p  . chlorhexidine  15 mL Mouth Rinse BID  . insulin aspart  0-15 Units Subcutaneous Q4H  . magnesium sulfate 1 - 4 g bolus IVPB  3 g Intravenous Once  . phenol  1 spray Mouth/Throat BID  . sodium chloride flush  10-40 mL Intracatheter Q12H  . sodium chloride flush  3 mL Intravenous Q12H    Assessment/Plan  Acute GI bleed / esophageal necrosis / gastric outlet obstruction with bowel ulceration EGD 04/22/16  - Dr. Luvenia Starch showed esophageal necrosis and outlet obstruction EGD 05/03/2016 - Severe reflux esophagitis. Biopsied. Rule out malignancy, gastric tumor at the pylorus and in the prepyloric region of the stomach. Biopsied. Gastric outlet obstruction  Pathology: 1. Stomach, biopsy, pyloric channel mass - SUSPICIOUS FOR ADENOCARCINOMA. 2. Esophagus, biopsy - EXUDATE WITH FOCAL NECROSIS CONSISTENT WITH ULCER. - NO MALIGNANCY IDENTIFIED. -  PAS STAIN NEGATIVE FOR FUNGUS. Severe Malnutrition - TNA Feeding tube would not pass beyond obstruction-  prealbumin 5.5 05/28/16 Fungemia -  Hyponatremia Severe protein calorie malnutrition - Low K+/Mag - being replaced FEN:  TNA/NPO ID: Anidulafungin day 13  DVT:  Not on heparin since at least 9/3 per [facility] - SCD   Plan: Dr. Excell Seltzer plans to talk with the family and discuss options with them [name],[name] Daughter [phone] Home  [phone] Mobile       LOS: 16 days     [name] [name] 05/31/2016 [id number]

## 2016-05-31 NOTE — Progress Notes (Signed)
PARENTERAL NUTRITION CONSULT NOTE - FOLLOW UP  Pharmacy Consult:  TPN Indication:  Gastric Outlet Obstruction  Allergies  Allergen Reactions  . Ace Inhibitors Cough  . Prednisone Other (See Comments)    LETHARGY  . Sitagliptin Other (See Comments)    Noted on [facility]   Patient Measurements: Height: 5\' 2"  (157.5 cm) Weight: 185 lb 1.6 oz (84 kg) IBW/kg (Calculated) : 50.1 Adjusted Body Weight: 75 kg Usual Weight: 80 kg  Vital Signs: Temp: 97.7 F (36.5 C) (09/14 0651) Temp Source: Oral (09/14 0651) BP: 158/73 (09/14 0651) Pulse Rate: 81 (09/14 0651) Intake/Output from previous day: 09/13 0701 - 09/14 0700 In: 1513.1 [I.V.:1513.1] Out: -  Intake/Output from this shift: No intake/output data recorded.    Insulin Requirements in the past 24 hours:  8 units moderate SSI + 15 units regular insulin in TPN  Assessment: 51 YOF recently admitted to [facility] with gastric outlet obstruction due to necrotic esophagitis and discharged on home Unasyn and TPN to [facility]. Admitted to [facility] 04/22/2016 with shortness of breath, fever, and confusion. PICC line was removed on admission due to concern for line infection - found to have fungemia. She is to continue nutrition support with TPN.  GI: GOO on chronic TPN.  Pepcid in TPN.  IR unable to place postpyloric tube d/t obstruction.  Prealbumin 3.5 > 8.1 > 5.5. Bx suspicious for adenocarcinoma with plan for resection and pyloroplasty vs GJ once nutrition status improves Endo: hx DM - CBGs trending up with cyclic TPN Lytes: hyponatremia prior to TPN initiation - Na added to TPN 9/10 per [facility] request - Na improved to 134, Mag 1.4, others WNL Renal: SCr stable, BUN 23 Cards: VSS - Lasix x1 on 9/13 Hepatobil: LFTs WNL, tbili / TG WNL Pulm: back on RA ID: s/p 14d [name] for C.glabrata bacteremia. +BCx after PICC placed, repeat BCx negative - afebrile, WBC 3.2 Best Practices: SCDs, Pepcid TPN Access: PICC replaced 05/24/16 TPN start date: on TPN  PTA  TPN at [facility]: (information obtained on 8/30 from [facility] - pharmacist at [place]) Clinimix E 5/15 1440 ml + 20% lipids 192 ml 35 units of regular insulin ordered for each bag - per discussion there is question if this was actually being added to each bag at the facility  Current Nutrition:  TPN  Nutritional Goals: 1500-1700 kCal and 70-80 grams of protein per day   Plan: - Continue cyclic TPN, infuse [id number] over 18 hrs:  50 ml/hr x 1 hr, then 91 ml/hr x 16 hrs, then 50 ml/hr x 1 hr - Lipids 13 ml/hr x 18 hrs - TPN provides approximately 1588 kCal and 78 gm of protein per day, meeting 100% of patient's needs. - Daily multivitamin and trace elements in TPN - Continue moderate SSI Q4H + continue 15 units of regular insulin in TPN - Pepcid 40mg  in TPN - Per MD request, add 185 mEq NaCL to make Clinimix NS concentration (sodium content 154 mEq/L). - Mag sulfate 3gm IV x 1.  F/U lab on Monday. - F/U CBGs off TPN and cycle further if appropriate   Jaimey Franchini D. Mina Marble, PharmD, BCPS Pager:  [phone] 05/31/2016, 8:52 AM

## 2016-06-01 ENCOUNTER — Inpatient Hospital Stay (HOSPITAL_COMMUNITY): Payer: Medicare Other

## 2016-06-01 LAB — COMPREHENSIVE METABOLIC PANEL
ALBUMIN: 1.6 g/dL — AB (ref 3.5–5.0)
ALK PHOS: 133 U/L — AB (ref 38–126)
ALT: 35 U/L (ref 14–54)
AST: 44 U/L — AB (ref 15–41)
Anion gap: 4 — ABNORMAL LOW (ref 5–15)
BILIRUBIN TOTAL: 0.4 mg/dL (ref 0.3–1.2)
BUN: 24 mg/dL — AB (ref 6–20)
CALCIUM: 7.9 mg/dL — AB (ref 8.9–10.3)
CO2: 26 mmol/L (ref 22–32)
CREATININE: 0.63 mg/dL (ref 0.44–1.00)
Chloride: 105 mmol/L (ref 101–111)
GFR calc Af Amer: >60 mL/min (ref >60)
GFR calc non Af Amer: >60 mL/min (ref >60)
GLUCOSE: 165 mg/dL — AB (ref 65–99)
Potassium: 3.7 mmol/L (ref 3.5–5.1)
SODIUM: 135 mmol/L (ref 135–145)
TOTAL PROTEIN: 4.3 g/dL — AB (ref 6.5–8.1)

## 2016-06-01 LAB — CBC WITH DIFFERENTIAL/PLATELET
BASOS ABS: 0 10*3/uL (ref 0.0–0.1)
BASOS PCT: 0 %
Eosinophils Absolute: 0.1 10*3/uL (ref 0.0–0.7)
Eosinophils Relative: 2 %
HEMATOCRIT: 27.7 % — AB (ref 36.0–46.0)
HEMOGLOBIN: 8.6 g/dL — AB (ref 12.0–15.0)
Lymphocytes Relative: 22 %
Lymphs Abs: 0.8 10*3/uL (ref 0.7–4.0)
MCH: 25.8 pg — ABNORMAL LOW (ref 26.0–34.0)
MCHC: 31 g/dL (ref 30.0–36.0)
MCV: 83.2 fL (ref 78.0–100.0)
MONOS PCT: 14 %
Monocytes Absolute: 0.5 10*3/uL (ref 0.1–1.0)
NEUTROS ABS: 2.1 10*3/uL (ref 1.7–7.7)
NEUTROS PCT: 62 %
Platelets: 144 10*3/uL — ABNORMAL LOW (ref 150–400)
RBC: 3.33 MIL/uL — AB (ref 3.87–5.11)
RDW: 15 % (ref 11.5–15.5)
WBC: 3.5 10*3/uL — AB (ref 4.0–10.5)

## 2016-06-01 LAB — GLUCOSE, CAPILLARY
GLUCOSE-CAPILLARY: 183 mg/dL — AB (ref 65–99)
GLUCOSE-CAPILLARY: 214 mg/dL — AB (ref 65–99)
Glucose-Capillary: 176 mg/dL — ABNORMAL HIGH (ref 65–99)
Glucose-Capillary: 176 mg/dL — ABNORMAL HIGH (ref 65–99)
Glucose-Capillary: 178 mg/dL — ABNORMAL HIGH (ref 65–99)
Glucose-Capillary: 174 mg/dL — ABNORMAL HIGH (ref 65–99)

## 2016-06-01 MED ORDER — FAT EMULSION 20 % IV EMUL
234.0000 mL | INTRAVENOUS | Status: DC
Start: 2016-06-01 — End: 2016-06-02
  Administered 2016-06-01: 234 mL via INTRAVENOUS
  Filled 2016-06-01: qty 250

## 2016-06-01 MED ORDER — TRACE MINERALS CR-CU-MN-SE-ZN 10-1000-500-60 MCG/ML IV SOLN
INTRAVENOUS | Status: DC
  Administered 2016-06-01: 18:00:00 via INTRAVENOUS
  Filled 2016-06-01: qty 1556

## 2016-06-01 MED ORDER — LORAZEPAM 2 MG/ML IJ SOLN
0.5000 mg | Freq: Once | INTRAMUSCULAR | Status: AC
  Administered 2016-06-01: 0.5 mg via INTRAVENOUS
  Filled 2016-06-01: qty 1

## 2016-06-01 MED ORDER — FUROSEMIDE 10 MG/ML IJ SOLN
40.0000 mg | Freq: Once | INTRAMUSCULAR | Status: AC
  Administered 2016-06-01: 40 mg via INTRAVENOUS
  Filled 2016-06-01: qty 4

## 2016-06-01 NOTE — Progress Notes (Signed)
Pt having a shaking spell. CBG 179 VSS (temp 98.9 oral). Paged Dr Verlon Au. Also made dr aware that pt hasnt voided since Lasix given. Dr Verlon Au ordered Bladder Scan and to insert foley catheter if Residual is >100. Ativan also ordered.   1630 Ativan administered. Bladder scan 270ml residual. Will talk with charge nurse and insert foley catheter.

## 2016-06-01 NOTE — Progress Notes (Signed)
CSW will continue to follow for pt return to [facility] when stable- [facility] can take TPN if pt will continue to need at DC they will require 24 hour advanced notice to accept pt back with TPN  Jorge Ny, [place] Social Worker [phone]

## 2016-06-01 NOTE — Progress Notes (Signed)
Subjective: No changes. Mentating clearly.   Objective: Vital signs in last 24 hours: Temp:  [97.6 F (36.4 C)-98.4 F (36.9 C)] 97.6 F (36.4 C) (09/15 0411) Pulse Rate:  [65-88] 84 (09/15 0411) Resp:  [18-19] 18 (09/15 0411) BP: (152-158)/(60-66) 158/66 (09/15 0411) SpO2:  [92 %-97 %] 95 % (09/15 0411) Weight:  [83.7 kg (184 lb 8 oz)] 83.7 kg (184 lb 8 oz) (09/15 0411) Last BM Date: 05/29/16 NPO 1500 IV fluids Voided x 4 Afebrile, VSS Labs CMP stable, Mag 1.4 WBC, H/H and platelets slowly declining    Intake/Output from previous day: PO 0 IV 384 UOP 100+ void x 1  General appearance: alert, cooperative and no distress GI: soft, non-tender; bowel sounds normal; no masses,  no organomegaly  Lab Results:   Recent Labs  05/31/16 0500 06/01/16 0451  WBC 3.2* 3.5*  HGB 9.1* 8.6*  HCT 29.0* 27.7*  PLT 143* 144*    BMET  Recent Labs  05/31/16 0500 06/01/16 0451  NA 134* 135  K 3.7 3.7  CL 104 105  CO2 26 26  GLUCOSE 144* 165*  BUN 23* 24*  CREATININE 0.68 0.63  CALCIUM 8.0* 7.9*   PT/INR No results for input(s): LABPROT, INR in the last 72 hours.   Recent Labs Lab 05/28/16 0430 05/31/16 0500 06/01/16 0451  AST 28 53* 44*  ALT 18 40 35  ALKPHOS 80 135* 133*  BILITOT 0.5 0.4 0.4  PROT 4.8* 4.6* 4.3*  ALBUMIN 1.7* 1.8* 1.6*     Lipase  No results found for: LIPASE   Studies/Results: No results found.  Medications: . antiseptic oral rinse  7 mL Mouth Rinse q12n4p  . chlorhexidine  15 mL Mouth Rinse BID  . insulin aspart  0-15 Units Subcutaneous Q4H  . phenol  1 spray Mouth/Throat BID  . sodium chloride flush  10-40 mL Intracatheter Q12H  . sodium chloride flush  3 mL Intravenous Q12H    Assessment/Plan  Acute GI bleed / esophageal necrosis / gastric outlet obstruction with bowel ulceration EGD 04/22/16  - Dr. Luvenia Starch showed esophageal necrosis and outlet obstruction EGD 05/08/2016 - Severe reflux esophagitis. Biopsied. Rule out  malignancy, gastric tumor at the pylorus and in the prepyloric region of the stomach. Biopsied. Gastric outlet obstruction  Pathology: 1. Stomach, biopsy, pyloric channel mass - SUSPICIOUS FOR ADENOCARCINOMA. 2. Esophagus, biopsy - EXUDATE WITH FOCAL NECROSIS CONSISTENT WITH ULCER. - NO MALIGNANCY IDENTIFIED. - PAS STAIN NEGATIVE FOR FUNGUS. Severe Malnutrition - TNA Feeding tube would not pass beyond obstruction-  prealbumin 5.5 05/28/16 Fungemia -  Hyponatremia Severe protein calorie malnutrition - Low K+/Mag - being replaced FEN:  TNA/NPO ID: Anidulafungin day 13  DVT:  Not on heparin since at least 9/3 per [facility] - SCD  Deconditioning: PT consult placed. Patient needs to be ambulating in the halls at least 3 times per day and remaining active and out of bed as much as possible. In addition to boosting her nutrition, need to avoid further physical deconditioning. Every day that she spends lying in bed will be an obstacle to recovery from any surgical intervention.    Plan: Dr. Excell Seltzer plans to talk with the family and discuss options with them Haga,Serena Daughter [phone] Home  [phone] Mobile       LOS: [place] days    [facility], [place] 06/01/2016

## 2016-06-01 NOTE — Progress Notes (Signed)
[facility] Progress Note  Patient: [name] [name] [id number]   PCP: [name], MD DOB: 10/03/1934   DOA: 04/26/2016   DOS: 06/01/2016   Date of Service: the patient was seen and examined on 06/01/2016  Subjective:    Patient is AAOX3, Some episodic confusion and perseveration but overall is much clearer than prior Wonders about overall plan   Brief hospital course:  81 y/o ? type II DM,  GERD,  Diverticulosis,  HTN; admitted initially 04/15/16 with dark stools-work-up showed acute esophageal ncrosis + partial Gastric Outlet obs + pyloric mass She had a rpt EGD on 04/22/16 which confirmed no malignant cellson 05/02/2016, CC abdominal pain, shortness of breath and fever,  Re-admitted 05/16/15 sepsis and AMS-Tmax 104.5 Rpt EGD showed severe reflux + Candidal fungemia.  Patient has a gastric outlet obstruction caused by tumor and plan is to have surgery operate on her  Currently further plan is improvement in nutritional status before surgery-It has been discussed with general surgery and the plan is for eventual surgery prior to any discharge OnTPN.     Assessment and Plan:  Sepsis Secondary to Fungemia (positive Candida glabrata) -currently afebrile and responding to therapy -completed 14 days of Anidulafungin per ID notes as on 05/30/16 -continue supportive care  Gastric tumor Stomach/pyloric channel mass- SUSPICIOUS FOR ADENOCARCINOMA -CCS recommends gastrojejunal bypass and not a gastrectomy. -Dr. Posey Pronto discussed with GI multiple times, GI recommended that surgery to obtain a wedge biopsy of the patient gastric mass. There is no plans for further EGDs. - Failed attempt at placement for Postpyloric tube by IR due to obstruction -I have spoken personally today 9/15 with Dr. Floria Raveling plan after conversation with the family are to consider a bypass type of surgery going forward -continue TPN for now  -Not safe to discharge at present time given high risk for  clinical decline - probable discharge within the next week for consideration of surgery   Hyponatremia likely acute in nature Hypomagnesemia -Continue to monitor sodium, getting Clinimix concentrate 154 MeQ -improving/stable currently 131-->134--->135  Severe Protein calorie malnutrition. -In the setting of gastric cancer causing prolonged decrease oral intake  -Currently on TPN. -Pharmacy monitoring and adjusting as needed  -Starting to develop a slightly elevated alkaline phosphatase and LFTs so may need to consider cycling or changing TPN?  Acute hypoxic respiratory failure / AMS Alos devleoping L sided Infiltrate based on 2 vw CXR 05/30/16 -will continue PRN O2 supplementation -no white count-suspect more fluid, trial 1x dose lasix 40 iv -patient cough and wheeze still slight and no overt evidence of cardio-resp dysfucntion-montor only right now -Ordered CXR 9/15 given wheeze and given one dose of Lasix. Chest x-ray shows fluid -We assess clinically again in a.m.  -most likely sundowning; will follow response, constant reorientation and supportive care  Acute GI bleed / esophageal necrosis / gastric outlet obstruction with bowel ulceration -EGD: Gastric tumor, esophagitis Currently w/o sign sof active bleeding  - defer to surgery further plans  Hypokalemia -continue repletion as needed  -K within range  Hypomagnesemia Continue repletion as needed , might need to replace in TPN -rpt mag in am and he   Normocytic anemia(Baseline Hg ~9.0) mildly neutropenic with mild TCP -stable, continue to monitor -PLT have dropped slightly  HTN -stable -continue Hydralazine PRN -Echocardiogram: EF [id number], diastolic dysfunction  DM type 2 controlled without complication -[id number] Hemoglobin A1c = 6.6 -continue SSI , sugars 140-160 range -TPN with insulin  Bowel regimen: last BM in 05/18/2016 Diet: Nothing  by mouth, continue TPN feeding DVT Prophylaxis: SCD's  Advance goals of  care discussion: Had a long chat with patient who is more oriented and states she would like to be FULL CODE-I went over this in detail with her 05/31/16 I reassessed and reemphasized risks of TPN, IV nutrition and although the patient is making some improvements, we have a long way to go prior to surgery. Patient is NOT ready for discharge at present time.   Consultants: Gen. surgery, gastroenterology and pharmacy  Procedures: TPN  Antibiotics: Anti-infectives    Start     Dose/Rate Route Frequency Ordered Stop   05/18/16 1700  anidulafungin ([name]) 100 mg in sodium chloride 0.9 % 100 mL IVPB     100 mg over 90 Minutes Intravenous Every 24 hours 05/08/2016 1627 05/30/16 1858   05/14/2016 1700  anidulafungin ([name]) 200 mg in sodium chloride 0.9 % 200 mL IVPB     200 mg over 180 Minutes Intravenous  Once 05/09/2016 1627 05/06/2016 2003   05/16/16 0200  vancomycin (VANCOCIN) IVPB 750 mg/150 ml premix  Status:  Discontinued     750 mg 150 mL/hr over 60 Minutes Intravenous Every 12 hours 05/13/2016 1409 05/16/16 1045   04/29/2016 1400  fluconazole (DIFLUCAN) IVPB 100 mg  Status:  Discontinued     100 mg 50 mL/hr over 60 Minutes Intravenous Every 24 hours 04/27/2016 1317 04/28/2016 1653   04/21/2016 1400  vancomycin (VANCOCIN) IVPB 1000 mg/200 mL premix  Status:  Discontinued     1,000 mg 200 mL/hr over 60 Minutes Intravenous  Once 04/26/2016 1353 04/21/2016 1407   04/19/2016 1330  vancomycin (VANCOCIN) IVPB 1000 mg/200 mL premix     1,000 mg 200 mL/hr over 60 Minutes Intravenous  Once 04/24/2016 1325 05/04/2016 1525   04/25/2016 1300  ceFEPIme (MAXIPIME) 2 g in dextrose 5 % 50 mL IVPB  Status:  Discontinued     2 g 100 mL/hr over 30 Minutes Intravenous Every 12 hours 05/05/2016 1257 05/16/16 1043        Intake/Output Summary (Last 24 hours) at 06/01/16 1416 Last data filed at 06/01/16 1149  Gross per 24 hour  Intake           329.13 ml  Output              400 ml  Net           -70.87 ml   Filed Weights    05/30/16 1246 05/31/16 0651 06/01/16 0411  Weight: 83.4 kg (183 lb 14.4 oz) 84 kg (185 lb 1.6 oz) 83.7 kg (184 lb 8 oz)    Objective: Physical Exam: Vitals:   05/31/16 0651 05/31/16 1318 05/31/16 2114 06/01/16 0411  BP: (!) 158/73 (!) 156/61 (!) 152/60 (!) 158/66  Pulse: 81 65 88 84  Resp: 18 19 18 18   Temp: 97.7 F (36.5 C) 98.3 F (36.8 C) 98.4 F (36.9 C) 97.6 F (36.4 C)  TempSrc: Oral Oral Oral Oral  SpO2: 98% 97% 92% 95%  Weight: 84 kg (185 lb 1.6 oz)   83.7 kg (184 lb 8 oz)  Height:        General: Alert, Awake and Oriented  Patient able to move four limbs spontaneously and no having focal deficit.Somecoughing at the bedside Eyes: PERRL, + pallor ENT: Oral Mucosa clear moist; no thrush  Neck: no bruits, no lumps. Unable to properly assess for JVD's due to body habitus Cardiovascular: S1 and S2 Present, positive systolic Murmur, Respiratory: wheezy this  am Abdomen: Bowel Sound present, Soft and no tenderness Skin: no redness, no Rash -she has diffuse swellin and body water Extremities: ++ Pedal edema, no calf tenderness Neurologic: Grossly no focal neuro deficit. Bilaterally Equal motor strength  Data Reviewed: CBC:  Recent Labs Lab 05/26/16 0530 05/28/16 0430 05/31/16 0500 06/01/16 0451  WBC 5.7 4.3 3.2* 3.5*  NEUTROABS 3.4 2.4 2.0 2.1  HGB 10.3* 9.8* 9.1* 8.6*  HCT 31.8* 30.7* 29.0* 27.7*  MCV 83.0 82.5 82.9 83.2  PLT 241 204 143* 123456*   Basic Metabolic Panel:  Recent Labs Lab 05/27/16 2005 05/28/16 0430 05/29/16 0415 05/30/16 0400 05/31/16 0500 06/01/16 0451  NA 125* 129* 128* 131* 134* 135  K 4.6 4.4 3.9 4.3 3.7 3.7  CL 97* 99* 98* 103 104 105  CO2 23 25 25 23 26 26   GLUCOSE 131* 102* 116* 106* 144* 165*  BUN 26* 26* 24* 29* 23* 24*  CREATININE 0.63 0.65 0.60 0.65 0.68 0.63  CALCIUM 7.9* 8.0* 8.1* 7.8* 8.0* 7.9*  MG 1.6* 1.5* 1.9 1.7 1.4*  --   PHOS  --  3.5 3.4 3.2 3.3  --     Liver Function Tests:  Recent Labs Lab 05/28/16 0430  05/31/16 0500 06/01/16 0451  AST 28 53* 44*  ALT 18 40 35  ALKPHOS 80 135* 133*  BILITOT 0.5 0.4 0.4  PROT 4.8* 4.6* 4.3*  ALBUMIN 1.7* 1.8* 1.6*   CBG:  Recent Labs Lab 05/31/16 2037 06/01/16 0011 06/01/16 0409 06/01/16 0756 06/01/16 1206  GLUCAP 175* 214* 176* 176* 178*    Studies: Dg Chest 2 View  Result Date: 06/01/2016 CLINICAL DATA:  Follow-up bilateral pleural effusions. EXAM: CHEST  2 VIEW COMPARISON:  05/30/2016, 05/24/2016 and earlier, including CT chest 04/17/2016. FINDINGS: Suboptimal inspiration. Cardiac silhouette mildly enlarged, unchanged. Interval development of mild diffuse interstitial and airspace pulmonary edema. Bilateral pleural effusions, left greater than right, unchanged. Associated consolidation in the lower lobes, unchanged. Right arm PICC tip projects over the lower SVC, unchanged. IMPRESSION: 1. Interval development of mild CHF and/or fluid overload, with mild interstitial and airspace pulmonary edema. 2. Stable bilateral pleural effusions, left greater than right, and associated passive atelectasis and/or pneumonia in the lower lobes. Electronically Signed   By: Evangeline Dakin M.D.   On: 06/01/2016 13:33     Scheduled Meds: . antiseptic oral rinse  7 mL Mouth Rinse q12n4p  . chlorhexidine  15 mL Mouth Rinse BID  . insulin aspart  0-15 Units Subcutaneous Q4H  . phenol  1 spray Mouth/Throat BID  . sodium chloride flush  10-40 mL Intracatheter Q12H  . sodium chloride flush  3 mL Intravenous Q12H   Continuous Infusions: . sodium chloride 10 mL/hr at 05/30/16 1347  . Marland KitchenTPN (CLINIMIX-E) Adult     And  . fat emulsion     PRN Meds: acetaminophen, hydrALAZINE, ipratropium-albuterol, menthol-cetylpyridinium, ondansetron, phenol, sodium chloride flush, sodium chloride flush  Time spent: 30 minutes  Verneita Griffes, MD [facility] ([phone]  If 7PM-7AM, please contact night-coverage at [email or web address], password [facility]

## 2016-06-01 NOTE — Progress Notes (Signed)
OT Cancellation Note  Patient Details Name: [name] [name] MRN: [id number] DOB: 1934/12/10   Cancelled Treatment:    Reason Eval/Treat Not Completed: Medical issues which prohibited therapy. Pt shaking all over, provided with warm blanket but pt continue with full-body shaking. Pt with audible wheezing, but unable to obtain SpO2 due to shaking. RN notified. Will hold off on therapy session today.  Redmond Baseman, OTR/L Pager: [phone] 06/01/2016, 4:09 PM

## 2016-06-01 NOTE — Care Management Important Message (Signed)
Important Message  Patient Details  Name: [name] [name] MRN: [id number] Date of Birth: [birth year]   Medicare Important Message Given:  Yes    [place] Doster Abena 06/01/2016, 11:43 AM

## 2016-06-01 NOTE — Progress Notes (Signed)
Prior to inserting foley catheter, pt had incontinent episode. Paged Dr Verlon Au. Will D/C foley order and not insert.  Pt not shaking now, and resting.

## 2016-06-01 NOTE — Progress Notes (Addendum)
PARENTERAL NUTRITION CONSULT NOTE - FOLLOW UP  Pharmacy Consult:  TPN Indication:  Gastric Outlet Obstruction  Allergies  Allergen Reactions  . Ace Inhibitors Cough  . Prednisone Other (See Comments)    LETHARGY  . Sitagliptin Other (See Comments)    Noted on [facility]   Patient Measurements: Height: '5\' 2"'$  (157.5 cm) Weight: 184 lb 8 oz (83.7 kg) IBW/kg (Calculated) : 50.1 Adjusted Body Weight: 75 kg Usual Weight: 80 kg  Vital Signs: Temp: 97.6 F (36.4 C) (09/15 0411) Temp Source: Oral (09/15 0411) BP: 158/66 (09/15 0411) Pulse Rate: 84 (09/15 0411) Intake/Output from previous day: 09/14 0701 - 09/15 0700 In: 383.8 [I.V.:383.8] Out: 100 [Urine:100] Intake/Output from this shift: No intake/output data recorded.    Insulin Requirements in the past 24 hours:  17 units moderate SSI + 15 units regular insulin in TPN  Assessment: 43 YOF recently admitted to [facility] with gastric outlet obstruction due to necrotic esophagitis and discharged on home Unasyn and TPN to [facility]. Admitted to [facility] 05/04/2016 with shortness of breath, fever, and confusion. PICC line was removed on admission due to concern for line infection - found to have fungemia. She is to continue nutrition support with TPN.  GI: GOO on chronic TPN.  Pepcid in TPN.  IR unable to place postpyloric tube d/t obstruction.  Prealbumin 3.5 > 8.1 > 5.5. Bx suspicious for adenocarcinoma with plan for resection and pyloroplasty vs GJ once nutrition status improves Endo: hx DM - CBGs trending up with cyclic TPN; however low of 91 when off. Lytes: hyponatremia prior to TPN initiation - Na added to TPN 9/10 per [facility] request - Na improved to 135, lytes WNL Renal: SCr stable, BUN 23 Cards: SBP 140-150s, HR 80-90s, NSR - Lasix x1 on 9/13 Hepatobil: LFTs bumped to AST 53>>44, Alk Phos 135>>133, tbili / TG WNL Pulm: RA ID: s/p 14d [name] for C.glabrata bacteremia. +BCx after PICC placed, repeat BCx negative - afebrile, WBC 3.5 Best  Practices: SCDs, Pepcid TPN Access: PICC replaced 05/24/16 TPN start date: on TPN PTA  TPN at [facility]: (information obtained on 8/30 from [place] - pharmacist at [place]) Clinimix E 5/15 1440 ml + 20% lipids 192 ml 35 units of regular insulin ordered for each bag - per discussion there is question if this was actually being added to each bag at the facility  Current Nutrition:  TPN  Nutritional Goals: 1500-1700 kCal and 70-80 grams of protein per day   Plan: - Continue cyclic TPN E 0/93, infuse 1558m over 18 hrs:  50 ml/hr x 1 hr, then 91 ml/hr x 16 hrs, then 50 ml/hr x 1 hr - Will continue 18 hour cycle today and watch CBGs closely. - Lipids 13 ml/hr x 18 hrs - TPN provides approximately 1588 kCal and 78 gm of protein per day, meeting 100% of patient's needs. - Daily multivitamin and trace elements in TPN - Continue moderate SSI Q4H + continue 15 units of regular insulin in TPN - Pepcid '40mg'$  in TPN - Per MD request, add 185 mEq NaCL to make Clinimix NS concentration (sodium content 154 mEq/L). - F/U CBGs off TPN and cycle further if appropriate   JSloan Leiter PharmD, BCPS Clinical Pharmacist [phone]/15/2017, 7:55 AM  Addendum: Discussed with Dr. SVerlon Auconcerns of volume overload - the max fluid rate for this patient is 91 mL/hr which is below our typical max fluid rate of 1179mhr for patients with decreased fluid requirements. No other IV  fluids on board. We are limited in what we can do with the standardized Clinimix product. All we can do would be to decrease the rate and provide less than goal kcals and protein. MD concerned about slight increase in liver enzymes which are now trending down on cyclic TPN. We are attempting to cycle further however the low CBG during the off period is cause of concern and why we have not cycled further. Tbili remains normal. Will continue to monitor closely.

## 2016-06-01 NOTE — Progress Notes (Signed)
Nutrition Follow-up  DOCUMENTATION CODES:   Obesity unspecified  INTERVENTION:   -TPN management per pharmacy   NUTRITION DIAGNOSIS:   Inadequate oral intake related to inability to eat as evidenced by NPO status.  Ongoing  GOAL:   Patient will meet greater than or equal to 90% of their needs  Met with TPN  MONITOR:   Diet advancement, Labs, Weight trends, Skin, I & O's  REASON FOR ASSESSMENT:   Consult New TPN/TNA  ASSESSMENT:    81 y.o. Female wiht PMH of degenerative disc disease, diabetes, diverticulosis, GERD, hiatal hernia, HLD, HTN, chronic hyponatremia recently admitted from 04/14/2016 until 04/27/2016 for treatment of acute GI bleed with gastric outlet obstruction, necrotic esophagitis with superficial fungal infection and ulceration with subsequent development of possible right-sided pneumonitis and aspiration pneumonia. Patient was discharged on Unasyn and completed her course on 05/14/2016.[name] Patient with left arm PICC in place since time of discharge for Unasyn and TPN administration. Patient's only complaint at this point time is shortness of breath and fevers. Denies worsening lower extremity edema, chest pain, palpitations, dysuria. Patient unable to provide further history.  Pt sleeping soundly at time of visit. RD did not wake.   TF was d/c on 05/25/16 and feeding tube removed due to inability to place tube past obstruction. Pt remains NPO, on TPN.   Reviewed pharmacy note; pt transitioned to cyclic TPN on 1/61/09. Plan to continue cyclic TPN E 6/04, infuse 1567m over 18 hrs:  50 ml/hr x 1 hr, then 91 ml/hr x 16 hrs, then 50 ml/hr x 1 hr (will continue 18 hour cycle today and watch CBGs closely). Lipids 13 ml/hr x 18 hrs. TPN provides approximately 1588 kcals and 78 grams of protein per day, meeting 100% of patient's needs.  Per MD notes, pt with gastric outlet obstruction caused by tumor. Per surgical service notes, pt is not a candidate for surgery due to  poor nutritional status. Pt may be able to undergo gastrojejunostomy in the next 1-2 weeks, however, not a candidate for gastrectomy. Surgical service planning on speaking with family regarding goals of care.   [facility] note. [facility]   Labs reviewed: CBGS: 175-214.   Diet Order:  Diet NPO time specified ADULT TPN TPN (CLINIMIX-E) Adult  Skin:  Reviewed, no issues  Last BM:  05/29/16  Height:   Ht Readings from Last 1 Encounters:  05/04/2016 _0  (1.575 m)    Weight:   Wt Readings from Last 1 Encounters:  06/01/16 184 lb 8 oz (83.7 kg)    Ideal Body Weight:  50 kg  BMI:  Body mass index is 33.75 kg/m.  Estimated Nutritional Needs:   Kcal:  1500-1700  Protein:  70-80 gm  Fluid:  1.5-1.7 L  EDUCATION NEEDS:   No education needs identified at this time  Dinia Joynt A. WJimmye Norman RD, LDN, CDE Pager: [phone] hours Pager: [phone]

## 2016-06-02 LAB — GLUCOSE, CAPILLARY
GLUCOSE-CAPILLARY: 157 mg/dL — AB (ref 65–99)
GLUCOSE-CAPILLARY: 167 mg/dL — AB (ref 65–99)
GLUCOSE-CAPILLARY: 218 mg/dL — AB (ref 65–99)
Glucose-Capillary: 169 mg/dL — ABNORMAL HIGH (ref 65–99)
Glucose-Capillary: 135 mg/dL — ABNORMAL HIGH (ref 65–99)
Glucose-Capillary: 193 mg/dL — ABNORMAL HIGH (ref 65–99)

## 2016-06-02 LAB — MAGNESIUM: MAGNESIUM: 1.5 mg/dL — AB (ref 1.7–2.4)

## 2016-06-02 MED ORDER — TRACE MINERALS CR-CU-MN-SE-ZN 10-1000-500-60 MCG/ML IV SOLN
INTRAVENOUS | Status: AC
  Administered 2016-06-02: 18:00:00 via INTRAVENOUS
  Filled 2016-06-02: qty 1556

## 2016-06-02 MED ORDER — FUROSEMIDE 10 MG/ML IJ SOLN
20.0000 mg | Freq: Two times a day (BID) | INTRAMUSCULAR | Status: DC
  Administered 2016-06-02 – 2016-06-04 (×4): 20 mg via INTRAVENOUS
  Filled 2016-06-02 (×4): qty 2

## 2016-06-02 MED ORDER — MAGNESIUM SULFATE 2 GM/50ML IV SOLN
2.0000 g | Freq: Once | INTRAVENOUS | Status: AC
  Administered 2016-06-02: 2 g via INTRAVENOUS
  Filled 2016-06-02: qty 50

## 2016-06-02 MED ORDER — FAT EMULSION 20 % IV EMUL
234.0000 mL | INTRAVENOUS | Status: AC
  Administered 2016-06-02: 234 mL via INTRAVENOUS
  Filled 2016-06-02: qty 250

## 2016-06-02 MED ORDER — TRACE MINERALS CR-CU-MN-SE-ZN 10-1000-500-60 MCG/ML IV SOLN
INTRAVENOUS | Status: AC
  Filled 2016-06-02: qty 1556

## 2016-06-02 NOTE — Progress Notes (Signed)
[facility] Progress Note  Patient: [name] [id number]   PCP: [name], MD DOB: 1934/11/09   DOA: 05/04/2016   DOS: 06/02/2016   Date of Service: the patient was seen and examined on 06/02/2016  Subjective:    Patient is AAOX3, Some episodic confusion and perseveration but overall is much clearer than prior Wonders about overall plan   Brief hospital course:  35 y/of with type II DM, GERD, Diverticulosis, HTN; admitted initially 04/15/16 with dark stools-work-up showed acute esophageal ncrosis + partial Gastric Outlet obs + pyloric mass She had a rpt EGD on 04/22/16 which confirmed no malignant cellson 04/18/2016, CC abdominal pain, shortness of breath and fever,  Re-admitted 05/16/15 sepsis and AMS-Tmax 104.5 Rpt EGD showed severe reflux + Candidal fungemia.  Patient has a gastric outlet obstruction caused by tumor and plan is to have surgery operate on her when nutritional status better  Assessment and Plan:  Sepsis Secondary to Fungemia (positive Candida glabrata) -currently afebrile and responding to therapy -completed 14 days of Anidulafungin per ID notes as of 05/30/16 -continue supportive care  Gastric tumor Stomach/pyloric channel mass- SUSPICIOUS FOR ADENOCARCINOMA -CCS recommends gastrojejunal bypass and not a gastrectomy. -Dr. Posey Pronto discussed with GI multiple times, GI recommended that surgery to obtain a wedge biopsy of the patient gastric mass. There is no plans for further EGDs. - Failed attempt at placement for Postpyloric tube by IR due to obstruction -Dr.Samtani d/w Dr. Excell Seltzer on 9/15-the plan after conversation with the family are to consider a bypass type of surgery going forward -continue TPN for now   Hyponatremia likely acute in nature/Hypomagnesemia -improved  Severe Protein calorie malnutrition. -In the setting of gastric cancer causing prolonged decrease oral intake  -Currently on TPN. -Pharmacy monitoring and adjusting as  needed   Pleural effusions/VOlume overload -without any distress at this time, received lasix IV yesterday will give lasix daily since on TNA  Hypokalemia -continue repletion as needed  -K within range  Hypomagnesemia Continue repletion as needed , might need to replace in TPN  Normocytic anemia(Baseline Hg ~9.0) mildly neutropenic with mild TCP -stable, continue to monitor -PLT have dropped slightly  HTN -stable, continue Hydralazine PRN -Echocardiogram: EF 123456, diastolic dysfunction  DM type 2 controlled without complication -[id number] Hemoglobin A1c = 6.6 -continue SSI , sugars 140-160 range -TPN with insulin  Full COde No family at bedside Dispo: depending on Surgical plans   Consultants: Gen. surgery, gastroenterology and pharmacy  Procedures: TPN  Antibiotics: Anti-infectives    Start     Dose/Rate Route Frequency Ordered Stop   05/18/16 1700  anidulafungin ([name]) 100 mg in sodium chloride 0.9 % 100 mL IVPB     100 mg over 90 Minutes Intravenous Every 24 hours 04/19/2016 1627 05/30/16 1858   04/18/2016 1700  anidulafungin ([name]) 200 mg in sodium chloride 0.9 % 200 mL IVPB     200 mg over 180 Minutes Intravenous  Once 05/02/2016 1627 05/16/2016 2003   05/16/16 0200  vancomycin (VANCOCIN) IVPB 750 mg/150 ml premix  Status:  Discontinued     750 mg 150 mL/hr over 60 Minutes Intravenous Every 12 hours 04/17/2016 1409 05/16/16 1045   04/29/2016 1400  fluconazole (DIFLUCAN) IVPB 100 mg  Status:  Discontinued     100 mg 50 mL/hr over 60 Minutes Intravenous Every 24 hours 05/07/2016 1317 04/25/2016 1653   05/01/2016 1400  vancomycin (VANCOCIN) IVPB 1000 mg/200 mL premix  Status:  Discontinued     1,000 mg 200 mL/hr over  60 Minutes Intravenous  Once 04/27/2016 1353 04/28/2016 1407   05/09/2016 1330  vancomycin (VANCOCIN) IVPB 1000 mg/200 mL premix     1,000 mg 200 mL/hr over 60 Minutes Intravenous  Once 05/03/2016 1325 04/26/2016 1525   04/27/2016 1300  ceFEPIme (MAXIPIME) 2 g in dextrose  5 % 50 mL IVPB  Status:  Discontinued     2 g 100 mL/hr over 30 Minutes Intravenous Every 12 hours 04/22/2016 1257 05/16/16 1043        Intake/Output Summary (Last 24 hours) at 06/02/16 1258 Last data filed at 06/02/16 0630  Gross per 24 hour  Intake           842.67 ml  Output                0 ml  Net           842.67 ml   Filed Weights   05/31/16 0651 06/01/16 0411 06/02/16 0427  Weight: 84 kg (185 lb 1.6 oz) 83.7 kg (184 lb 8 oz) 84 kg (185 lb 1.6 oz)    Objective: Physical Exam: Vitals:   06/01/16 0411 06/01/16 1439 06/01/16 2215 06/02/16 0427  BP: (!) 158/66 (!) 161/64 (!) 160/51 (!) 141/68  Pulse: 84 87 89 79  Resp: 18 18 18 18   Temp: 97.6 F (36.4 C) 98.2 F (36.8 C) 99.4 F (37.4 C) 97.6 F (36.4 C)  TempSrc: Oral  Oral Oral  SpO2: 95% 95% 92% 94%  Weight: 83.7 kg (184 lb 8 oz)   84 kg (185 lb 1.6 oz)  Height:        General: Alert, Awake and Oriented x2 , debilitated Eyes: PERRL, + pallor ENT: Oral Mucosa clear moist; no thrush  Neck: no bruits, no lumps. Unable to properly assess for JVD's due to body habitus Cardiovascular: S1 and S2 Present, positive systolic Murmur, Respiratory: decreased BS at bedside Abdomen: Bowel Sound present, Soft and no tenderness Skin: no redness, no Rash -she has diffuse swellin and body water Extremities: 1plusPedal edema, no calf tenderness Neurologic: Grossly no focal neuro deficit.   Data Reviewed: CBC:  Recent Labs Lab 05/28/16 0430 05/31/16 0500 06/01/16 0451  WBC 4.3 3.2* 3.5*  NEUTROABS 2.4 2.0 2.1  HGB 9.8* 9.1* 8.6*  HCT 30.7* 29.0* 27.7*  MCV 82.5 82.9 83.2  PLT 204 143* 123456*   Basic Metabolic Panel:  Recent Labs Lab 05/28/16 0430 05/29/16 0415 05/30/16 0400 05/31/16 0500 06/01/16 0451 06/02/16 0532  NA 129* 128* 131* 134* 135  --   K 4.4 3.9 4.3 3.7 3.7  --   CL 99* 98* 103 104 105  --   CO2 25 25 23 26 26   --   GLUCOSE 102* 116* 106* 144* 165*  --   BUN 26* 24* 29* 23* 24*  --   CREATININE  0.65 0.60 0.65 0.68 0.63  --   CALCIUM 8.0* 8.1* 7.8* 8.0* 7.9*  --   MG 1.5* 1.9 1.7 1.4*  --  1.5*  PHOS 3.5 3.4 3.2 3.3  --   --     Liver Function Tests:  Recent Labs Lab 05/28/16 0430 05/31/16 0500 06/01/16 0451  AST 28 53* 44*  ALT 18 40 35  ALKPHOS 80 135* 133*  BILITOT 0.5 0.4 0.4  PROT 4.8* 4.6* 4.3*  ALBUMIN 1.7* 1.8* 1.6*   CBG:  Recent Labs Lab 06/01/16 2020 06/02/16 0009 06/02/16 0422 06/02/16 0829 06/02/16 1223  GLUCAP 183* 218* 167* 169* 157*  Studies: Dg Chest 2 View  Result Date: 06/01/2016 CLINICAL DATA:  Follow-up bilateral pleural effusions. EXAM: CHEST  2 VIEW COMPARISON:  05/30/2016, 05/24/2016 and earlier, including CT chest 04/17/2016. FINDINGS: Suboptimal inspiration. Cardiac silhouette mildly enlarged, unchanged. Interval development of mild diffuse interstitial and airspace pulmonary edema. Bilateral pleural effusions, left greater than right, unchanged. Associated consolidation in the lower lobes, unchanged. Right arm PICC tip projects over the lower SVC, unchanged. IMPRESSION: 1. Interval development of mild CHF and/or fluid overload, with mild interstitial and airspace pulmonary edema. 2. Stable bilateral pleural effusions, left greater than right, and associated passive atelectasis and/or pneumonia in the lower lobes. Electronically Signed   By: Evangeline Dakin M.D.   On: 06/01/2016 13:33     Scheduled Meds: . antiseptic oral rinse  7 mL Mouth Rinse q12n4p  . chlorhexidine  15 mL Mouth Rinse BID  . insulin aspart  0-15 Units Subcutaneous Q4H  . phenol  1 spray Mouth/Throat BID  . sodium chloride flush  10-40 mL Intracatheter Q12H  . sodium chloride flush  3 mL Intravenous Q12H   Continuous Infusions: . sodium chloride 10 mL/hr at 05/30/16 1347  . Marland KitchenTPN (CLINIMIX-E) Adult     And  . fat emulsion    . Marland KitchenTPN (CLINIMIX-E) Adult Stopped (06/02/16 1147)   And  . fat emulsion Stopped (06/02/16 1147)   PRN Meds: acetaminophen,  hydrALAZINE, ipratropium-albuterol, menthol-cetylpyridinium, ondansetron, phenol, sodium chloride flush, sodium chloride flush  Time spent: 30 minutes  Domenic Polite, MD [facility] (P) [phone]  If 7PM-7AM, please contact night-coverage at [email or web address], password [facility]

## 2016-06-02 NOTE — Progress Notes (Signed)
PARENTERAL NUTRITION CONSULT NOTE - FOLLOW UP  Pharmacy Consult:  TPN Indication:  Gastric Outlet Obstruction  Allergies  Allergen Reactions  . Ace Inhibitors Cough  . Prednisone Other (See Comments)    LETHARGY  . Sitagliptin Other (See Comments)    Noted on [name]   Patient Measurements: Height: 5\' 2"  (157.5 cm) Weight: 185 lb 1.6 oz (84 kg) IBW/kg (Calculated) : 50.1 Adjusted Body Weight: 75 kg Usual Weight: 80 kg  Vital Signs: Temp: 97.6 F (36.4 C) (09/16 0427) Temp Source: Oral (09/16 0427) BP: 141/68 (09/16 0427) Pulse Rate: 79 (09/16 0427) Intake/Output from previous day: 09/15 0701 - 09/16 0700 In: 842.7 [I.V.:842.7] Out: 400 [Urine:400] Intake/Output from this shift: No intake/output data recorded.    Insulin Requirements in the past 24 hours:  20 units moderate SSI + 15 units regular insulin in TPN  Assessment: 24 YOF recently admitted to [name] with gastric outlet obstruction due to necrotic esophagitis and discharged on home Unasyn and TPN to [name]. Admitted to [name] 05/08/2016 with shortness of breath, fever, and confusion. PICC line was removed on admission due to concern for line infection - found to have fungemia. She is to continue nutrition support with TPN.  9/15- Discussed with Dr. 10/15 concerns of volume overload -we are currently below our typical max rate of 151ml/hr for patients with decreased fluid requirements. No other IV fluids on board. We are limited in what we can do with the standardized Clinimix product. All we can do would be to decrease the rate but this would provide less than goal kcals and protein. MD concerned about slight increase in liver enzymes which are now trending down on cyclic TPN. Tbili remains normal. Will continue to monitor closely.  9/16- Patient reports no trouble with breathing overnight. Clinically does not appear to be volume up today. Discussed with Dr. 10/16 - at risk for volume overload so would like to keep  cycling over 18 hours for now.   GI: GOO on chronic TPN.  Pepcid in TPN.  IR unable to place postpyloric tube d/t obstruction.  Prealbumin 3.5 > 8.1 > 5.5. Bx suspicious for adenocarcinoma with plan for resection and pyloroplasty vs GJ once nutrition status improves Endo: hx DM - CBGs 167-218 with cyclic TPN; during off period CBGs 170s.  Lytes: hyponatremia prior to TPN initiation - Na added to TPN 9/10 per [facility] request - Na improved to 135, Mg low at 1.5 - BMET not done.  Renal: SCr stable, BUN 23 Cards: SBP 140-160s, ST this AM - Lasix 9/13 and 9/15 Hepatobil: LFTs very mildly bumped but trending down, AST 53>>44, Alk Phos 135>>133, tbili / TG WNL Pulm: RA ID: s/p 14d [name] for C.glabrata bacteremia. +BCx after PICC placed, repeat BCx negative - afebrile, WBC 3.5 Best Practices: SCDs, Pepcid TPN Access: PICC replaced 05/24/16 TPN start date: on TPN PTA  TPN at [name]: (information obtained on 8/30 from [name] - pharmacist at [name] [phone]) Clinimix E 5/15 1440 ml + 20% lipids 192 ml 35 units of regular insulin ordered for each bag - per discussion there is question if this was actually being added to each bag at the facility  Current Nutrition:  TPN  Nutritional Goals: 1500-1700 kCal and 70-80 grams of protein per day    Plan:  - Cycle TPN E 5/15, infuse 6/15 over 18 hrs:  50 ml/hr x 1 hr, then 91 ml/hr x 16 hrs, then 50 ml/hr x 1 hr  -  Lipids 13 ml/hr x 18 hrs - TPN provides approximately 1588 kCal and 78 gm of protein per day, meeting 100% of patient's needs. - Daily multivitamin and trace elements in TPN - Continue moderate SSI Q4H + increase 20 units of regular insulin in TPN in new bag at 1800 - Pepcid '40mg'$  in TPN - Per MD request, add 185 mEq NaCL to make Clinimix NS concentration (sodium content 154 mEq/L). - F/U CBGs off TPN and cycle further if appropriate - Repeat BMET, Magnesium, and Phos in AM  - Follow-up volume status and ability to cycle over  shorter period of time.    Sloan Leiter, PharmD, BCPS Clinical Pharmacist [phone] 06/02/2016, 8:44 AM

## 2016-06-02 NOTE — Progress Notes (Signed)
Physical therapy note: Received new order dated 9/15. Pt currently on caseload and will continue to follow at appropriate frequency 2x/wk. Recommend daily mobility with nursing. Elwyn Reach, [place]

## 2016-06-03 LAB — CBC
HEMATOCRIT: 27.1 % — AB (ref 36.0–46.0)
Hemoglobin: 8.7 g/dL — ABNORMAL LOW (ref 12.0–15.0)
MCH: 26.4 pg (ref 26.0–34.0)
MCHC: 32.1 g/dL (ref 30.0–36.0)
MCV: 82.1 fL (ref 78.0–100.0)
Platelets: 95 10*3/uL — ABNORMAL LOW (ref 150–400)
RBC: 3.3 MIL/uL — AB (ref 3.87–5.11)
RDW: 15.2 % (ref 11.5–15.5)
WBC: 8.8 10*3/uL (ref 4.0–10.5)

## 2016-06-03 LAB — MAGNESIUM: Magnesium: 1.5 mg/dL — ABNORMAL LOW (ref 1.7–2.4)

## 2016-06-03 LAB — BASIC METABOLIC PANEL
Anion gap: 7 (ref 5–15)
BUN: 27 mg/dL — AB (ref 6–20)
CHLORIDE: 104 mmol/L (ref 101–111)
CO2: 28 mmol/L (ref 22–32)
Calcium: 8 mg/dL — ABNORMAL LOW (ref 8.9–10.3)
Creatinine, Ser: 0.72 mg/dL (ref 0.44–1.00)
GFR calc non Af Amer: >60 mL/min (ref >60)
Glucose, Bld: 177 mg/dL — ABNORMAL HIGH (ref 65–99)
POTASSIUM: 3 mmol/L — AB (ref 3.5–5.1)
SODIUM: 139 mmol/L (ref 135–145)

## 2016-06-03 LAB — GLUCOSE, CAPILLARY
Glucose-Capillary: 151 mg/dL — ABNORMAL HIGH (ref 65–99)
Glucose-Capillary: 156 mg/dL — ABNORMAL HIGH (ref 65–99)
Glucose-Capillary: 159 mg/dL — ABNORMAL HIGH (ref 65–99)
Glucose-Capillary: 196 mg/dL — ABNORMAL HIGH (ref 65–99)
Glucose-Capillary: 200 mg/dL — ABNORMAL HIGH (ref 65–99)

## 2016-06-03 LAB — PHOSPHORUS: Phosphorus: 2.5 mg/dL (ref 2.5–4.6)

## 2016-06-03 MED ORDER — MAGNESIUM SULFATE 2 GM/50ML IV SOLN
2.0000 g | Freq: Once | INTRAVENOUS | Status: AC
  Administered 2016-06-03: 2 g via INTRAVENOUS
  Filled 2016-06-03: qty 50

## 2016-06-03 MED ORDER — TRACE MINERALS CR-CU-MN-SE-ZN 10-1000-500-60 MCG/ML IV SOLN
INTRAVENOUS | Status: AC
  Administered 2016-06-03: 18:00:00 via INTRAVENOUS
  Filled 2016-06-03: qty 1556

## 2016-06-03 MED ORDER — POTASSIUM CHLORIDE 10 MEQ/100ML IV SOLN
10.0000 meq | INTRAVENOUS | Status: AC
  Administered 2016-06-03 (×4): 10 meq via INTRAVENOUS
  Filled 2016-06-03 (×4): qty 100

## 2016-06-03 MED ORDER — FAT EMULSION 20 % IV EMUL
234.0000 mL | INTRAVENOUS | Status: AC
  Administered 2016-06-03: 234 mL via INTRAVENOUS
  Filled 2016-06-03: qty 250

## 2016-06-03 NOTE — Progress Notes (Signed)
PARENTERAL NUTRITION CONSULT NOTE - FOLLOW UP  Pharmacy Consult:  TPN Indication:  Gastric Outlet Obstruction  Allergies  Allergen Reactions  . Ace Inhibitors Cough  . Prednisone Other (See Comments)    LETHARGY  . Sitagliptin Other (See Comments)    Noted on [facility]   Patient Measurements: Height: 5\' 2"  (157.5 cm) Weight: 185 lb 1.5 oz (84 kg) IBW/kg (Calculated) : 50.1 Adjusted Body Weight: 75 kg Usual Weight: 80 kg  Vital Signs: Temp: 98.1 F (36.7 C) (09/17 0513) BP: 127/45 (09/17 0513) Pulse Rate: 106 (09/17 0513) Intake/Output from previous day: 09/16 0701 - 09/17 0700 In: 1322.9 [I.V.:1322.9] Out: -  Intake/Output from this shift: No intake/output data recorded.    Insulin Requirements in the past 24 hours:  14 units moderate SSI since new bag hung + 20 units regular insulin in TPN  Assessment: 63 YOF recently admitted to [facility] with gastric outlet obstruction due to necrotic esophagitis and discharged on home Unasyn and TPN to [facility]. Admitted to [facility] 05/12/2016 with shortness of breath, fever, and confusion. PICC line was removed on admission due to concern for line infection - found to have fungemia. She is to continue nutrition support with TPN.  9/15- Discussed with Dr. 10/15 concerns of volume overload -we are currently below our typical max rate of 184ml/hr for patients with decreased fluid requirements. No other IV fluids on board. We are limited in what we can do with the standardized Clinimix product. All we can do would be to decrease the rate but this would provide less than goal kcals and protein. MD concerned about slight increase in liver enzymes which are now trending down on cyclic TPN. Tbili remains normal. Will continue to monitor closely.  9/16- Patient reports no trouble with breathing overnight however not able to converse well. Clinically does not appear to be volume up today. Discussed with Dr. 10/16 - at risk for volume overload so would  like to keep cycling over 18 hours for now. Ordering Lasix daily. Lytes replacement outside TPN due to premixed product.   GI: GOO on chronic TPN.  Pepcid in TPN.  IR unable to place postpyloric tube d/t obstruction.  Prealbumin 3.5 > 8.1 > 5.5. Bx suspicious for adenocarcinoma with plan for resection and pyloroplasty vs GJ once nutrition status improves Endo: hx DM - CBGs 159-196 with cyclic TPN; during off period CBGs 135.  Lytes: hyponatremia prior to TPN initiation - Na added to TPN 9/10 per [facility] request - Na improved to 139 (rising ~3-4 mEq per 24 hrs with added Na to TPN), K 3- Replaced by MD. Mg 1.5 and Phos wnl.  Renal: SCr stable, BUN 23, UOP not recorded.  Cards: SBP 120-150s, ST to NSR - Lasix 20 IV q12h  Hepatobil: LFTs very mildly bumped but trending down, AST 53>>44, Alk Phos 135>>133, tbili / TG WNL Pulm: RA - sats 91-94, normal RR.  ID: s/p 14d [name] for C.glabrata bacteremia. +BCx after PICC placed, repeat BCx negative - afebrile, WBC 3.5 Best Practices: SCDs, Pepcid TPN Access: PICC replaced 05/24/16 TPN start date: on TPN PTA  TPN at [facility]: (information obtained on 8/30 from [place] - pharmacist at [facility] [phone]) Clinimix E 5/15 1440 ml + 20% lipids 192 ml 35 units of regular insulin ordered for each bag - per discussion there is question if this was actually being added to each bag at the facility  Current Nutrition:  TPN  Nutritional Goals: 1500-1700 kCal and 70-80  grams of protein per day   Plan:  - Continue to cycle TPN E 5/15, infuse 1583m over 18 hrs:  50 ml/hr x 1 hr, then 91 ml/hr x 16 hrs, then 50 ml/hr x 1 hr -- unable to cycle further due to volume status at this time - Lipids 13 ml/hr x 18 hrs - TPN provides approximately 1588 kCal and 78 gm of protein per day, meeting 100% of patient's needs. - Daily multivitamin and trace elements in TPN - Continue moderate SSI Q4H + increase to 25 units of regular insulin in TPN in new bag at 1800 -  Pepcid '40mg'$  in TPN - Per MD request, add 185 mEq NaCL to make Clinimix NS concentration (sodium content 154 mEq/L) - consider if needs adjustment this week as Na returning to normal level - Give Magnesium 2g IV now - F/U CBGs off TPN and cycle further if appropriate from volume status - Follow-up volume status and ability to cycle over shorter period of time - Follow-up AM TPN labs   JSloan Leiter PharmD, BCPS Clinical Pharmacist [phone]/17/2017, 8:39 AM

## 2016-06-03 NOTE — Progress Notes (Signed)
[facility] Progress Note  Patient: [name] [id number]   PCP: [name], MD DOB: [birth year]   DOA: 05/14/2016   DOS: 06/03/2016   Brief hospital course:  89 y/of with type II DM, GERD, Diverticulosis, HTN; admitted initially 04/15/16 with dark stools-work-up showed acute esophageal ncrosis + partial Gastric Outlet obs + pyloric mass She had a rpt EGD on 04/22/16 which confirmed no malignant cellson 04/28/2016, CC abdominal pain, shortness of breath and fever,  Re-admitted 05/16/15 sepsis and AMS-Tmax 104.5 Rpt EGD showed severe reflux + Candidal fungemia.  Patient has a gastric outlet obstruction caused by tumor and plan is to have surgery operate on her when nutritional status better  Assessment and Plan:  Sepsis Secondary to Fungemia (positive Candida glabrata) -currently afebrile and responding to therapy -completed 14 days of Anidulafungin per ID notes as of 05/30/16 -continue supportive care  Gastric tumor Stomach/pyloric channel mass- SUSPICIOUS FOR ADENOCARCINOMA -CCS recommends gastrojejunal bypass  -Dr. Posey Pronto discussed with GI multiple times, GI recommended that surgery to obtain a wedge biopsy of the patient gastric mass. There is no plans for further EGDs. - Failed attempt at placement for Postpyloric tube by IR due to obstruction -Dr.Samtani d/w Dr. Excell Seltzer on 9/15-the plan after conversation with the family are to consider a bypass type of surgery going forward, CCS following -continue TPN for now   Hyponatremia likely acute in nature/Hypomagnesemia -improved  Severe Protein calorie malnutrition. -In the setting of gastric cancer causing prolonged decrease oral intake  -Currently on TPN. -Pharmacy monitoring and adjusting as needed   Pleural effusions/VOlume overload -without any distress at this time, received lasix IV 9/15 will give lasix daily since on TNA, monitor Bmet  Hypokalemia -continue repletion as needed  -K low again due to  lasix  Hypomagnesemia Continue repletion as needed , might need to replace in TPN  Normocytic anemia(Baseline Hg ~9.0) mildly neutropenic with mild TCP -stable, continue to monitor -PLT have dropped more, not on any offending meds, monitor  HTN -stable, continue Hydralazine PRN -Echocardiogram: EF 123456, diastolic dysfunction  DM type 2 controlled without complication -[id number] Hemoglobin A1c = 6.6 -continue SSI , sugars 140-160 range -TPN with insulin  Full COde No family at bedside Dispo: depending on Surgical plans   Consultants: Gen. surgery, gastroenterology and pharmacy  Procedures: TPN  Antibiotics: Anti-infectives    Start     Dose/Rate Route Frequency Ordered Stop   05/18/16 1700  anidulafungin ([name]) 100 mg in sodium chloride 0.9 % 100 mL IVPB     100 mg over 90 Minutes Intravenous Every 24 hours 04/22/2016 1627 05/30/16 1858   05/11/2016 1700  anidulafungin ([name]) 200 mg in sodium chloride 0.9 % 200 mL IVPB     200 mg over 180 Minutes Intravenous  Once 05/16/2016 1627 05/09/2016 2003   05/16/16 0200  vancomycin (VANCOCIN) IVPB 750 mg/150 ml premix  Status:  Discontinued     750 mg 150 mL/hr over 60 Minutes Intravenous Every 12 hours 05/05/2016 1409 05/16/16 1045   04/28/2016 1400  fluconazole (DIFLUCAN) IVPB 100 mg  Status:  Discontinued     100 mg 50 mL/hr over 60 Minutes Intravenous Every 24 hours 05/16/2016 1317 04/19/2016 1653   05/03/2016 1400  vancomycin (VANCOCIN) IVPB 1000 mg/200 mL premix  Status:  Discontinued     1,000 mg 200 mL/hr over 60 Minutes Intravenous  Once 05/11/2016 1353 04/23/2016 1407   05/06/2016 1330  vancomycin (VANCOCIN) IVPB 1000 mg/200 mL premix     1,000 mg  200 mL/hr over 60 Minutes Intravenous  Once 05/13/2016 1325 04/25/2016 1525   04/25/2016 1300  ceFEPIme (MAXIPIME) 2 g in dextrose 5 % 50 mL IVPB  Status:  Discontinued     2 g 100 mL/hr over 30 Minutes Intravenous Every 12 hours 05/09/2016 1257 05/16/16 1043        Intake/Output Summary (Last 24  hours) at 06/03/16 1155 Last data filed at 06/03/16 [id number]  Gross per 24 hour  Intake          1322.91 ml  Output                0 ml  Net          1322.91 ml   Filed Weights   06/01/16 0411 06/02/16 0427 06/03/16 0300  Weight: 83.7 kg (184 lb 8 oz) 84 kg (185 lb 1.6 oz) 84 kg (185 lb 1.5 oz)    Subjective: feels ok, no complaints, denies dyspnea  Objective: Physical Exam: Vitals:   06/02/16 1415 06/02/16 2126 06/03/16 0300 06/03/16 0513  BP: (!) 102/39 (!) 150/59  (!) 127/45  Pulse: 99 84  (!) 106  Resp: 19 19  18   Temp: 98.2 F (36.8 C) 98.2 F (36.8 C)  98.1 F (36.7 C)  TempSrc: Oral     SpO2: (!) 89% 91%  91%  Weight:   84 kg (185 lb 1.5 oz)   Height:        General: Alert, Awake and Oriented x2 , debilitated Eyes: PERRL, + pallor ENT: Oral Mucosa clear moist; no thrush  Neck: no bruits, no lumps. Unable to properly assess for JVD's due to body habitus Cardiovascular: S1 and S2 Present, positive systolic Murmur, Respiratory: decreased BS at bedside Abdomen: Bowel Sound present, Soft and no tenderness Skin: no redness, no Rash  Extremities: 1plusPedal edema, no calf tenderness Neurologic: Grossly no focal neuro deficit.   Data Reviewed: CBC:  Recent Labs Lab 05/28/16 0430 05/31/16 0500 06/01/16 0451 06/03/16 0430  WBC 4.3 3.2* 3.5* 8.8  NEUTROABS 2.4 2.0 2.1  --   HGB 9.8* 9.1* 8.6* 8.7*  HCT 30.7* 29.0* 27.7* 27.1*  MCV 82.5 82.9 83.2 82.1  PLT 204 143* 144* 95*   Basic Metabolic Panel:  Recent Labs Lab 05/28/16 0430 05/29/16 0415 05/30/16 0400 05/31/16 0500 06/01/16 0451 06/02/16 0532 06/03/16 0430  NA 129* 128* 131* 134* 135  --  139  K 4.4 3.9 4.3 3.7 3.7  --  3.0*  CL 99* 98* 103 104 105  --  104  CO2 25 25 23 26 26   --  28  GLUCOSE 102* 116* 106* 144* 165*  --  177*  BUN 26* 24* 29* 23* 24*  --  27*  CREATININE 0.65 0.60 0.65 0.68 0.63  --  0.72  CALCIUM 8.0* 8.1* 7.8* 8.0* 7.9*  --  8.0*  MG 1.5* 1.9 1.7 1.4*  --  1.5*  --   PHOS  3.5 3.4 3.2 3.3  --   --   --     Liver Function Tests:  Recent Labs Lab 05/28/16 0430 05/31/16 0500 06/01/16 0451  AST 28 53* 44*  ALT 18 40 35  ALKPHOS 80 135* 133*  BILITOT 0.5 0.4 0.4  PROT 4.8* 4.6* 4.3*  ALBUMIN 1.7* 1.8* 1.6*   CBG:  Recent Labs Lab 06/02/16 1223 06/02/16 1703 06/02/16 2015 06/03/16 0008 06/03/16 0325  GLUCAP 157* 135* 193* 196* 159*    Studies: No results found.   Scheduled Meds: .  antiseptic oral rinse  7 mL Mouth Rinse q12n4p  . chlorhexidine  15 mL Mouth Rinse BID  . furosemide  20 mg Intravenous Q12H  . insulin aspart  0-15 Units Subcutaneous Q4H  . phenol  1 spray Mouth/Throat BID  . potassium chloride  10 mEq Intravenous Q1 Hr x 4  . sodium chloride flush  10-40 mL Intracatheter Q12H  . sodium chloride flush  3 mL Intravenous Q12H   Continuous Infusions: . sodium chloride 10 mL/hr at 06/03/16 0604  . Marland KitchenTPN (CLINIMIX-E) Adult 91 mL/hr at 06/02/16 1900   And  . fat emulsion 234 mL (06/02/16 1755)  . Marland KitchenTPN (CLINIMIX-E) Adult     And  . fat emulsion     PRN Meds: acetaminophen, hydrALAZINE, ipratropium-albuterol, menthol-cetylpyridinium, ondansetron, phenol, sodium chloride flush, sodium chloride flush  Time spent: 30 minutes  Domenic Polite, MD [facility] (P) [phone]  If 7PM-7AM, please contact night-coverage at [email or web address], password [email or web address]

## 2016-06-04 ENCOUNTER — Inpatient Hospital Stay (HOSPITAL_COMMUNITY): Payer: Medicare Other

## 2016-06-04 LAB — CBC WITH DIFFERENTIAL/PLATELET
BASOS ABS: 0 10*3/uL (ref 0.0–0.1)
Basophils Relative: 0 %
Eosinophils Absolute: 0 10*3/uL (ref 0.0–0.7)
Eosinophils Relative: 0 %
HCT: 27.5 % — ABNORMAL LOW (ref 36.0–46.0)
Hemoglobin: 8.5 g/dL — ABNORMAL LOW (ref 12.0–15.0)
LYMPHS ABS: 1.2 10*3/uL (ref 0.7–4.0)
Lymphocytes Relative: 15 %
MCH: 25.7 pg — ABNORMAL LOW (ref 26.0–34.0)
MCHC: 30.9 g/dL (ref 30.0–36.0)
MCV: 83.1 fL (ref 78.0–100.0)
MONO ABS: 0.7 10*3/uL (ref 0.1–1.0)
Monocytes Relative: 9 %
NEUTROS ABS: 6.2 10*3/uL (ref 1.7–7.7)
Neutrophils Relative %: 76 %
PLATELETS: 73 10*3/uL — AB (ref 150–400)
RBC: 3.31 MIL/uL — AB (ref 3.87–5.11)
RDW: 15.1 % (ref 11.5–15.5)
WBC: 8.1 10*3/uL (ref 4.0–10.5)

## 2016-06-04 LAB — URINE MICROSCOPIC-ADD ON: RBC / HPF: NONE SEEN RBC/hpf (ref 0–5)

## 2016-06-04 LAB — COMPREHENSIVE METABOLIC PANEL
ALK PHOS: 212 U/L — AB (ref 38–126)
ALT: 53 U/L (ref 14–54)
ANION GAP: 10 (ref 5–15)
AST: 58 U/L — ABNORMAL HIGH (ref 15–41)
Albumin: 2 g/dL — ABNORMAL LOW (ref 3.5–5.0)
BILIRUBIN TOTAL: 0.4 mg/dL (ref 0.3–1.2)
BUN: 28 mg/dL — ABNORMAL HIGH (ref 6–20)
CALCIUM: 8.4 mg/dL — AB (ref 8.9–10.3)
CO2: 25 mmol/L (ref 22–32)
Chloride: 106 mmol/L (ref 101–111)
Creatinine, Ser: 0.83 mg/dL (ref 0.44–1.00)
Glucose, Bld: 286 mg/dL — ABNORMAL HIGH (ref 65–99)
Potassium: 3.7 mmol/L (ref 3.5–5.1)
SODIUM: 141 mmol/L (ref 135–145)
TOTAL PROTEIN: 5.9 g/dL — AB (ref 6.5–8.1)

## 2016-06-04 LAB — URINALYSIS, ROUTINE W REFLEX MICROSCOPIC
Glucose, UA: NEGATIVE mg/dL
Hgb urine dipstick: NEGATIVE
KETONES UR: NEGATIVE mg/dL
NITRITE: POSITIVE — AB
PH: 6 (ref 5.0–8.0)
Protein, ur: 100 mg/dL — AB
SPECIFIC GRAVITY, URINE: 1.023 (ref 1.005–1.030)

## 2016-06-04 LAB — GLUCOSE, CAPILLARY
GLUCOSE-CAPILLARY: 136 mg/dL — AB (ref 65–99)
GLUCOSE-CAPILLARY: 274 mg/dL — AB (ref 65–99)
GLUCOSE-CAPILLARY: 293 mg/dL — AB (ref 65–99)
GLUCOSE-CAPILLARY: 329 mg/dL — AB (ref 65–99)
Glucose-Capillary: 232 mg/dL — ABNORMAL HIGH (ref 65–99)
Glucose-Capillary: 155 mg/dL — ABNORMAL HIGH (ref 65–99)

## 2016-06-04 LAB — TRIGLYCERIDES: Triglycerides: 125 mg/dL (ref <150)

## 2016-06-04 LAB — MAGNESIUM: MAGNESIUM: 1.7 mg/dL (ref 1.7–2.4)

## 2016-06-04 LAB — PREALBUMIN: Prealbumin: 6.1 mg/dL — ABNORMAL LOW (ref 18–38)

## 2016-06-04 LAB — PHOSPHORUS: PHOSPHORUS: 2.9 mg/dL (ref 2.5–4.6)

## 2016-06-04 MED ORDER — MAGNESIUM SULFATE 2 GM/50ML IV SOLN
2.0000 g | Freq: Once | INTRAVENOUS | Status: AC
  Administered 2016-06-04: 2 g via INTRAVENOUS
  Filled 2016-06-04: qty 50

## 2016-06-04 MED ORDER — PIPERACILLIN-TAZOBACTAM 3.375 G IVPB
3.3750 g | Freq: Three times a day (TID) | INTRAVENOUS | Status: DC
  Administered 2016-06-04 – 2016-06-05 (×3): 3.375 g via INTRAVENOUS
  Filled 2016-06-04 (×5): qty 50

## 2016-06-04 MED ORDER — POTASSIUM CHLORIDE 10 MEQ/50ML IV SOLN
10.0000 meq | INTRAVENOUS | Status: AC
  Administered 2016-06-04 (×2): 10 meq via INTRAVENOUS
  Filled 2016-06-04 (×2): qty 50

## 2016-06-04 MED ORDER — FAT EMULSION 20 % IV EMUL
234.0000 mL | INTRAVENOUS | Status: AC
  Administered 2016-06-04: 234 mL via INTRAVENOUS
  Filled 2016-06-04: qty 250

## 2016-06-04 MED ORDER — TRACE MINERALS CR-CU-MN-SE-ZN 10-1000-500-60 MCG/ML IV SOLN
INTRAVENOUS | Status: AC
  Administered 2016-06-04: 18:00:00 via INTRAVENOUS
  Filled 2016-06-04: qty 1556

## 2016-06-04 MED ORDER — FUROSEMIDE 10 MG/ML IJ SOLN
20.0000 mg | Freq: Two times a day (BID) | INTRAMUSCULAR | Status: DC
  Administered 2016-06-04 – 2016-06-06 (×5): 20 mg via INTRAVENOUS
  Filled 2016-06-04 (×5): qty 2

## 2016-06-04 NOTE — Progress Notes (Signed)
Occupational Therapy Treatment Patient Details Name: [name] [name] MRN: [id number] DOB: 1935/08/13 Today's Date: 06/04/2016    History of present illness 81 y.o.femalewith history of degenerative disc disease, DM, diverticulosis, GERD, hiatal hernia, HLD, HTN, and chronic hyponatremia who was admitted 04/14/2016 >04/27/2016 for treatment of acute GI bleed with gastric outlet obstruction due to necrotic esophagitis with superficial fungal infection and ulceration with subsequent development of possible right-sided pneumonitis and aspiration pneumonia. She returned to the ED c/o shortness of breath, fevers, and confusion. +sepsis (?due to PICC)   OT comments  Pt making progress with functional goals. Pt with confusion and difficulty initiating and processing during ADL tasks while seated in recliner. Pt sliding down in recliner, however able to scoot back and correct her posture on command. Ot will continue to follow acutely  Follow Up Recommendations  SNF    Equipment Recommendations  None recommended by OT (TBD at next venue of care)    Recommendations for Other Services      Precautions / Restrictions Precautions Precautions: Fall Precaution Comments: TPN Restrictions Weight Bearing Restrictions: No       Mobility Bed Mobility         Supine to sit: +2 for physical assistance;Max assist     General bed mobility comments: pt up in recliner  Transfers       Sit to Stand: +2 physical assistance;Mod assist Stand pivot transfers: Mod assist;+2 physical assistance       General transfer comment: did not attempt, just up with PT with +2 assist to recliner    Balance   Sitting-balance support: Bilateral upper extremity supported;Feet supported Sitting balance-Leahy Scale: Poor (pt sliding down in recliner upo entering room and able to scoot back and increase posture on command)     Standing balance support: Bilateral upper extremity supported;During functional  activity Standing balance-Leahy Scale: Poor                     ADL       Grooming: Sitting;Wash/dry face;Wash/dry hands;Brushing hair;Set up;Supervision/safety Grooming Details (indicate cue type and reason): sitting in recliner         Upper Body Dressing : Minimal assistance;Sitting                     General ADL Comments: Required cues to initate washing face and to wash and dress UB, able to comb hair without verbal cues when handed comb to pt. Pt required increased time to complete tasks due to initiation and processing deficits       Vision  no change from baseline                              Cognition   Behavior During Therapy: Flat affect Overall Cognitive Status: Impaired/Different from baseline Area of Impairment: Attention;Safety/judgement;Problem solving Orientation Level: Disoriented to;Situation;Time Current Attention Level: Sustained Memory: Decreased short-term memory    Safety/Judgement: Decreased awareness of safety;Decreased awareness of deficits   Problem Solving: Slow processing;Decreased initiation;Difficulty sequencing General Comments: pt has increase temp this a.m.    Extremity/Trunk Assessment   generalized weakness                        General Comments  pt pleasant and cooperative    Pertinent Vitals/ Pain       Pain Assessment: No/denies pain Faces Pain Scale: Hurts little more Pain Location: generalized  Pain Descriptors / Indicators: Grimacing;Guarding Pain Intervention(s): Monitored during session;Repositioned                                            Prior Functioning/Environment              Frequency  Min 2X/week        Progress Toward Goals  OT Goals(current goals can now be found in the care plan section)  Progress towards OT goals: Progressing toward goals  Acute Rehab OT Goals Patient Stated Goal: not stated  Plan Discharge plan remains  appropriate    Co-evaluatio n                 End of Session     Activity Tolerance Patient tolerated treatment well   Patient Left in chair;with call bell/phone within reach   Nurse Communication          Time: [id number] OT Time Calculation (min): 26 min  Charges: OT General Charges $OT Visit: 1 Procedure OT Treatments $Self Care/Home Management : 8-22 mins $Therapeutic Activity: 8-22 mins  Britt Bottom 06/04/2016, 11:30 AM

## 2016-06-04 NOTE — Progress Notes (Signed)
Pt unable to void at this time. Urine culture also ordered. Dr Broadus John made aware and ordered straight cath X1. Straight cath done by Colletta Maryland, RN with this nurse assisting. Pt tolerated well.  Will continue to monitor pt. Bed placed in lowest position and call bell is within reach.

## 2016-06-04 NOTE — Progress Notes (Signed)
PARENTERAL NUTRITION CONSULT NOTE - FOLLOW UP  Pharmacy Consult:  TPN Indication:  Gastric Outlet Obstruction  Allergies  Allergen Reactions  . Ace Inhibitors Cough  . Prednisone Other (See Comments)    LETHARGY  . Sitagliptin Other (See Comments)    Noted on [facility]   Patient Measurements: Height: '5\' 2"'$  (157.5 cm) Weight: 185 lb 1.5 oz (84 kg) IBW/kg (Calculated) : 50.1 Adjusted Body Weight: 75 kg Usual Weight: 80 kg  Vital Signs: Temp: 100.7 F (38.2 C) (09/18 0617) Temp Source: Oral (09/18 0617) BP: 152/93 (09/18 0617) Pulse Rate: 120 (09/18 0617) Intake/Output from previous day: 09/17 0701 - 09/18 0700 In: 1368 [I.V.:1368] Out: -  Intake/Output from this shift: No intake/output data recorded.    Insulin Requirements in the past 24 hours:  18 units moderate SSI + 25 units regular insulin in TPN  Assessment: 35 YOF recently admitted to [facility] with gastric outlet obstruction due to necrotic esophagitis and discharged on home Unasyn and TPN to [facility]. Admitted to [facility] 04/28/2016 with shortness of breath, fever, and confusion. PICC line was removed on admission due to concern for line infection - found to have fungemia. She is to continue nutrition support with TPN.  GI: Pepcid in TPN.  IR unable to place postpyloric tube d/t obstruction.  Prealbumin 3.5 > 6.1, slow rise d/t stress of illness.  Bx suspicious for adenocarcinoma with plan for resection and pyloroplasty vs GJ once nutrition status improves Endo: hx DM - CBGs 200s while on TPN, 150s while off (insulin in TPN range: 20-50 units) Lytes: hyponatremia prior to TPN initiation.  Na added to TPN 9/10 per [facility] request - Na rising steadily and now at 141, others WNL Renal: SCr stable, BUN 28, UOP not recorded.  Cards: BP high normal, HR trending up - IV Lasix BID d/t hypervolemia Hepatobil: alk phos elevated, AST mildly elevated, tbili / TG WNL.  Expect LFTs to rise given no gut stimulation. Pulm: stable on RA - sats  90s ID: s/p 14d [name] for C.glabrata bacteremia. +BCx after PICC placed, repeat BCx negative - Tmax 100.7, WBC 3.5.  Repeat BCx. Best Practices: SCDs TPN Access: PICC replaced 05/24/16 TPN start date: on TPN PTA  TPN at [facility]: (information obtained on 8/30 from [facility] - pharmacist at [place]) Clinimix E 5/15 1440 ml + 20% lipids 192 ml 35 units of regular insulin ordered for each bag - per discussion there is question if this was actually being added to each bag at the facility  Current Nutrition:  TPN  Nutritional Goals: 1500-1700 kCal and 70-80 grams of protein per day    Plan:  - Continue cyclic Clinimix E 8/10, infuse 1569m over 18 hrs:  50 ml/hr x 1 hr, then 91 ml/hr x 16 hrs, then 50 ml/hr x 1 hr - Lipids 13 ml/hr x 18 hrs - TPN provides approximately 1588 kCal and 78 gm of protein per day, meeting 100% of patient's needs. - Daily multivitamin and trace elements in TPN - Continue moderate SSI Q4H + increase to 30 units of regular insulin in TPN - Pepcid '40mg'$  in TPN - Add 644m NaCL to make Clinimix 1/2NS concentration (sodium content 77 mEq/L) - KCL x 2 runs (on Lasix) - Mag sulfate 2gm IV x 1 - F/U volume status and CBGs, cycle over a shorter period of time as appropriate - F/U fever curve, repeat culture, labs on Wed   [name] Brazeau D. DaMina MarblePharmD, BCFort Pierce Southager:  [phone]  [phone] 06/04/2016, 7:54 AM

## 2016-06-04 NOTE — Progress Notes (Signed)
Physical Therapy Treatment Patient Details Name: [name] [name] MRN: [id number] DOB: 08/02/1935 Today's Date: 06/04/2016    History of Present Illness 81 y.o.femalewith history of degenerative disc disease, DM, diverticulosis, GERD, hiatal hernia, HLD, HTN, and chronic hyponatremia who was admitted 04/14/2016 >04/27/2016 for treatment of acute GI bleed with gastric outlet obstruction due to necrotic esophagitis with superficial fungal infection and ulceration with subsequent development of possible right-sided pneumonitis and aspiration pneumonia. She returned to the ED c/o shortness of breath, fevers, and confusion. +sepsis (?due to PICC)    PT Comments    Pt with decline in functional. Increased assist required for all mobility. Goals updated.  Follow Up Recommendations  SNF     Equipment Recommendations  None recommended by PT    Recommendations for Other Services       Precautions / Restrictions Precautions Precautions: Fall;Other (comment) Precaution Comments: TPN    Mobility  Bed Mobility         Supine to sit: +2 for physical assistance;Max assist     General bed mobility comments: multimodal cues for sequencing  Transfers       Sit to Stand: +2 physical assistance;Mod assist Stand pivot transfers: Mod assist;+2 physical assistance       General transfer comment: Attempted ambulation with RW. Pt having difficulty with motor planning and progressing LEs for steps. RW removed and SPT performed bed to recliner.  Ambulation/Gait             General Gait Details: unable   Stairs            Wheelchair Mobility    Modified Rankin (Stroke Patients Only)       Balance   Sitting-balance support: Bilateral upper extremity supported;Feet supported Sitting balance-Leahy Scale: Poor     Standing balance support: Bilateral upper extremity supported;During functional activity Standing balance-Leahy Scale: Poor                       Cognition Arousal/Alertness: Awake/alert Behavior During Therapy: Flat affect Overall Cognitive Status: Impaired/Different from baseline Area of Impairment: Attention;Safety/judgement;Problem solving Orientation Level: Disoriented to;Situation;Time Current Attention Level: Sustained Memory: Decreased short-term memory   Safety/Judgement: Decreased awareness of safety;Decreased awareness of deficits   Problem Solving: Slow processing;Decreased initiation;Difficulty sequencing General Comments: Pt appears confused this AM. She does have increased temp.    Exercises      General Comments General comments (skin integrity, edema, etc.): HR 137 during mobility. 116 seated in recliner      Pertinent Vitals/Pain Pain Assessment: Faces Faces Pain Scale: Hurts little more Pain Location: generalized Pain Descriptors / Indicators: Grimacing;Guarding Pain Intervention(s): Monitored during session;Repositioned    Home Living                      Prior Function            PT Goals (current goals can now be found in the care plan section) Acute Rehab PT Goals Patient Stated Goal: not stated PT Goal Formulation: Patient unable to participate in goal setting Time For Goal Achievement: 06/15/16 Potential to Achieve Goals: Fair Progress towards PT goals: Not progressing toward goals - comment (decline in med status)    Frequency    Min 2X/week      PT Plan Current plan remains appropriate    Co-evaluation             End of Session Equipment Utilized During Treatment: Gait belt Activity Tolerance: Patient  limited by fatigue Patient left: in chair;with call bell/phone within reach;with chair alarm set     Time: [phone] PT Time Calculation (min) (ACUTE ONLY): 16 min  Charges:  $Therapeutic Activity: 8-22 mins                    G Codes:      Lorriane Shire 06/04/2016, 9:09 AM

## 2016-06-04 NOTE — Progress Notes (Signed)
18 Days Post-Op  Subjective: Confused this AM, talking feels febrile,  Ongoing TNA for nutrition.    Objective: Vital signs in last 24 hours: Temp:  [97.5 F (36.4 C)-100.7 F (38.2 C)] 100.7 F (38.2 C) (09/18 0617) Pulse Rate:  [85-120] 120 (09/18 0617) Resp:  [20-22] 22 (09/18 0617) BP: (145-152)/(56-93) 152/93 (09/18 0617) SpO2:  [93 %] 93 % (09/18 0617) Last BM Date: 06/03/16 1368 IV recorded No urine recorded Stool x 2 recorded Tm 100.7 at 3 AM today, BP also up at that time CMP OK - prealbumin is 6.1 this AM, 5.5 last week No films today Intake/Output from previous day: 09/17 0701 - 09/18 0700 In: 1368 [I.V.:1368] Out: -  Intake/Output this shift: No intake/output data recorded.  General appearance: alert and feels febrile now GI: soft, non-tender; bowel sounds normal; no masses,  no organomegaly  Lab Results:   Recent Labs  06/03/16 0430  WBC 8.8  HGB 8.7*  HCT 27.1*  PLT 95*    BMET  Recent Labs  06/03/16 0430 06/04/16 0500  NA 139 141  K 3.0* 3.7  CL 104 106  CO2 28 25  GLUCOSE 177* 286*  BUN 27* 28*  CREATININE 0.72 0.83  CALCIUM 8.0* 8.4*   PT/INR No results for input(s): LABPROT, INR in the last 72 hours.   Recent Labs Lab 05/31/16 0500 06/01/16 0451 06/04/16 0500  AST 53* 44* 58*  ALT 40 35 53  ALKPHOS 135* 133* 212*  BILITOT 0.4 0.4 0.4  PROT 4.6* 4.3* 5.9*  ALBUMIN 1.8* 1.6* 2.0*     Lipase  No results found for: LIPASE   Studies/Results: No results found.  Medications: . antiseptic oral rinse  7 mL Mouth Rinse q12n4p  . chlorhexidine  15 mL Mouth Rinse BID  . furosemide  20 mg Intravenous Q12H  . insulin aspart  0-15 Units Subcutaneous Q4H  . magnesium sulfate 1 - 4 g bolus IVPB  2 g Intravenous Once  . phenol  1 spray Mouth/Throat BID  . potassium chloride  10 mEq Intravenous Q1 Hr x 2  . sodium chloride flush  10-40 mL Intracatheter Q12H  . sodium chloride flush  3 mL Intravenous Q12H   . sodium chloride 10  mL/hr at 06/03/16 0604  . Marland KitchenTPN (CLINIMIX-E) Adult 91 mL/hr at 06/03/16 1900   And  . fat emulsion 234 mL (06/03/16 1753)  . Marland KitchenTPN (CLINIMIX-E) Adult     And  . fat emulsion      Assessment/Plan  Acute GI bleed / esophageal necrosis / gastric outlet obstruction with bowel ulceration EGD 04/22/16 - Dr. Luvenia Starch showed esophageal necrosis and outlet obstruction EGD 04/18/2016 - Severe reflux esophagitis. Biopsied. Rule out malignancy, gastric tumor at the pylorus and in the prepyloric region of the stomach. Biopsied. Gastric outlet obstruction  Pathology: 1. Stomach, biopsy, pyloric channel mass - SUSPICIOUS FOR ADENOCARCINOMA. 2. Esophagus, biopsy - EXUDATE WITH FOCAL NECROSIS CONSISTENT WITH ULCER. - NO MALIGNANCY IDENTIFIED. - PAS STAIN NEGATIVE FOR FUNGUS. Severe Malnutrition - TNA Feeding tube would not pass beyond obstruction-  prealbumin 5.5 05/28/16  - 6.1 this AM 06/04/16 Fungemia - treated with 14 days Anidulafungin  Hyponatremia Severe protein calorie malnutrition - Low K+/Mag - being replaced FEN: TNA since 04/19/16 - NPO ID: Anidulafungincompleted 14 days on 05/30/16- currently no antibiotics DVT:  SCD's only  Will discuss with Dr. Grandville Silos.       LOS: 20 days    Kerrin Markman 06/04/2016 [phone]

## 2016-06-04 NOTE — Progress Notes (Signed)
Pharmacy Antibiotic Note  [name] [name] is a 81 y.o. female admitted on 04/25/2016 Pt is known to pharmacy for TPN dosing d/t gastric outlet obstruction. Recently completed a 14 day course of [name] for C. Glabrata (8/31 >> 9/13) now spiking fevers again. Pharmacy has been consulted for zosyn dosing for a presumed intraabdominal infection. Renal function has been stable. Est. crcl ~ 55 ml/min.  Plan: Zosyn 3.375g IV Q 8 hrs.  Pharmacy sign off for zosyn dosing, will continue follow patient for TPN dosing and monitoring.    Height: 5\' 2"  (157.5 cm) Weight: 185 lb 1.5 oz (84 kg) IBW/kg (Calculated) : 50.1  Temp (24hrs), Avg:99.8 F (37.7 C), Min:97.5 F (36.4 C), Max:102.2 F (39 C)   Recent Labs Lab 05/30/16 0400 05/31/16 0500 06/01/16 0451 06/03/16 0430 06/04/16 0500  WBC  --  3.2* 3.5* 8.8  --   CREATININE 0.65 0.68 0.63 0.72 0.83    Estimated Creatinine Clearance: 56.2 mL/min (by C-G formula based on SCr of 0.83 mg/dL).    Allergies  Allergen Reactions  . Ace Inhibitors Cough  . Prednisone Other (See Comments)    LETHARGY  . Sitagliptin Other (See Comments)    Noted on [facility]    Antimicrobials this admission: Zosyn 9/18 >>    Dose adjustments this admission:   Microbiology results: 9/18 BCx:   Thank you for allowing pharmacy to be a part of this patient's care.  Maryanna Shape, PharmD, BCPS  Clinical Pharmacist  Pager: [phone]  06/04/2016 12:37 PM

## 2016-06-04 NOTE — Care Management Important Message (Signed)
Important Message  Patient Details  Name: [name] [name] MRN: [id number] Date of Birth: 06-25-1935   Medicare Important Message Given:  Yes    Korinne Greenstein [place] 06/04/2016, 9:53 AM

## 2016-06-04 NOTE — Plan of Care (Signed)
Problem: Physical Regulation: Goal: Ability to maintain clinical measurements within normal limits will improve Outcome: Not Progressing Pt ran fever today, 102.2 rectally. Down to 97.9 orally. Goal: Will remain free from infection Outcome: Not Progressing UA shows possible UTI. Zosyn ordered.  Problem: Activity: Goal: Risk for activity intolerance will decrease Outcome: Progressing Pt up in chair today.

## 2016-06-04 NOTE — Progress Notes (Signed)
Notified Dr. Maudie Mercury that pt's temp is 100.7 this morning. MD stated would get blood cultures. Giving tylenol supp. as ordered. Will continue to monitor pt. Ranelle Oyster, RN

## 2016-06-04 NOTE — Progress Notes (Signed)
Rectal temp 102.2. Paged Dr Broadus John.

## 2016-06-04 NOTE — Progress Notes (Signed)
[facility] Progress Note  Patient: [name] [name] [id number]   PCP: [name], MD DOB: 1935/01/21   DOA: 05/08/2016   DOS: 06/04/2016   Brief hospital course:  69 y/of with type II DM, GERD, Diverticulosis, HTN; admitted initially 04/15/16 with dark stools-work-up showed acute esophageal ncrosis + partial Gastric Outlet obs + pyloric mass She had a rpt EGD on 04/22/16 which confirmed no malignant cellson 05/02/2016, CC abdominal pain, shortness of breath and fever,  Re-admitted 05/16/15 sepsis and AMS-Tmax 104.5 Rpt EGD showed severe reflux + Candidal fungemia.  Patient has a gastric outlet obstruction caused by tumor and plan is to have surgery operate on her when nutritional status better  Assessment and Plan:  Gastric tumor Stomach/pyloric channel mass- SUSPICIOUS FOR ADENOCARCINOMA -CCS recommends gastrojejunal bypass  -Dr. Posey Pronto discussed with GI multiple times, GI recommended that surgery to obtain a wedge biopsy of the patient gastric mass. There is no plans for further EGDs. - Failed attempt at placement for Postpyloric tube by IR due to obstruction -Dr.Samtani d/w Dr. Excell Seltzer on 9/15-the plan after conversation with the family are to consider a bypass type of surgery going forward, [facility]CS following -continue TPN for now  -on IV lasix due to volume overload related to TNA  Sepsis Secondary to Fungemia (positive Candida glabrata) -currently afebrile and responding to therapy -completed 14 days of Anidulafungin per ID notes as of 05/30/16 -continue supportive care  Fever 9/18 -CXR with CHF/effusions and compressive atelectasis -UA pending -Blood cultures x2 ordered-will FU -Empiric IV Zosyn,   Hyponatremia likely acute in nature/Hypomagnesemia -due to hypervolemia, continue IV lasix  Severe Protein calorie malnutrition. -In the setting of gastric cancer causing prolonged decrease oral intake  -Currently on TPN. -Pharmacy monitoring and adjusting as needed    Pleural effusions/VOlume overload -without any distress at this time, received lasix IV 9/15 will give lasix daily since on TNA, monitor Bmet  Hypokalemia -continue repletion as needed  -improved  Hypomagnesemia Continue repletion as needed , might need to replace in TPN  Normocytic anemia(Baseline Hg ~9.0) -stable, continue to monitor  HTN -stable, continue Hydralazine PRN -Echocardiogram: EF 123456, diastolic dysfunction  DM type 2 controlled without complication -[id number] Hemoglobin A1c = 6.6 -continue SSI , sugars 110-160 range -TPN with insulin  Full COde No family at bedside, will try to reach daughter Dispo: depending on Surgical plans   Consultants: Gen. surgery, gastroenterology and pharmacy  Procedures: TPN  Antibiotics: Anti-infectives    Start     Dose/Rate Route Frequency Ordered Stop   05/18/16 1700  anidulafungin ([name]) 100 mg in sodium chloride 0.9 % 100 mL IVPB     100 mg over 90 Minutes Intravenous Every 24 hours 04/18/2016 1627 05/30/16 1858   05/08/2016 1700  anidulafungin ([name]) 200 mg in sodium chloride 0.9 % 200 mL IVPB     200 mg over 180 Minutes Intravenous  Once 05/02/2016 1627 05/03/2016 2003   05/16/16 0200  vancomycin (VANCOCIN) IVPB 750 mg/150 ml premix  Status:  Discontinued     750 mg 150 mL/hr over 60 Minutes Intravenous Every 12 hours 04/29/2016 1409 05/16/16 1045   05/13/2016 1400  fluconazole (DIFLUCAN) IVPB 100 mg  Status:  Discontinued     100 mg 50 mL/hr over 60 Minutes Intravenous Every 24 hours 04/26/2016 1317 05/07/2016 1653   04/18/2016 1400  vancomycin (VANCOCIN) IVPB 1000 mg/200 mL premix  Status:  Discontinued     1,000 mg 200 mL/hr over 60 Minutes Intravenous  Once 05/15/16 1353  05/12/2016 1407   05/17/2016 1330  vancomycin (VANCOCIN) IVPB 1000 mg/200 mL premix     1,000 mg 200 mL/hr over 60 Minutes Intravenous  Once 05/10/2016 1325 05/12/2016 1525   05/05/2016 1300  ceFEPIme (MAXIPIME) 2 g in dextrose 5 % 50 mL IVPB  Status:  Discontinued      2 g 100 mL/hr over 30 Minutes Intravenous Every 12 hours 05/14/2016 1257 05/16/16 1043        Intake/Output Summary (Last 24 hours) at 06/04/16 1212 Last data filed at 06/04/16 1200  Gross per 24 hour  Intake             1368 ml  Output              100 ml  Net             1268 ml   Filed Weights   06/01/16 0411 06/02/16 0427 06/03/16 0300  Weight: 83.7 kg (184 lb 8 oz) 84 kg (185 lb 1.6 oz) 84 kg (185 lb 1.5 oz)    Subjective: feels ok, no complaints, denies dyspnea  Objective: Physical Exam: Vitals:   06/04/16 0617 06/04/16 0900 06/04/16 0915 06/04/16 1100  BP: (!) 152/93     Pulse: (!) 120     Resp: (!) 22     Temp: (!) 100.7 F (38.2 C) (!) 100.9 F (38.3 C) (!) 102.2 F (39 C) 97.9 F (36.6 C)  TempSrc: Oral Axillary Rectal Oral  SpO2: 93%     Weight:      Height:        General: Alert, Awake and Oriented x2 , debilitated Eyes: PERRL, + pallor ENT: Oral Mucosa clear moist; no thrush  Neck: no bruits, no lumps. Unable to properly assess for JVD's due to body habitus Cardiovascular: S1 and S2 Present, positive systolic Murmur, Respiratory: decreased BS at bedside Abdomen: Bowel Sound present, Soft and no tenderness Skin: no redness, no Rash  Extremities: 1plusPedal edema, no calf tenderness Neurologic: Grossly no focal neuro deficit.   Data Reviewed: CBC:  Recent Labs Lab 05/31/16 0500 06/01/16 0451 06/03/16 0430  WBC 3.2* 3.5* 8.8  NEUTROABS 2.0 2.1  --   HGB 9.1* 8.6* 8.7*  HCT 29.0* 27.7* 27.1*  MCV 82.9 83.2 82.1  PLT 143* 144* 95*   Basic Metabolic Panel:  Recent Labs Lab 05/29/16 0415 05/30/16 0400 05/31/16 0500 06/01/16 0451 06/02/16 0532 06/03/16 0430 06/03/16 1106 06/04/16 0500  NA 128* 131* 134* 135  --  139  --  141  K 3.9 4.3 3.7 3.7  --  3.0*  --  3.7  CL 98* 103 104 105  --  104  --  106  CO2 25 23 26 26   --  28  --  25  GLUCOSE 116* 106* 144* 165*  --  177*  --  286*  BUN 24* 29* 23* 24*  --  27*  --  28*   CREATININE 0.60 0.65 0.68 0.63  --  0.72  --  0.83  CALCIUM 8.1* 7.8* 8.0* 7.9*  --  8.0*  --  8.4*  MG 1.9 1.7 1.4*  --  1.5*  --  1.5* 1.7  PHOS 3.4 3.2 3.3  --   --   --  2.5 2.9    Liver Function Tests:  Recent Labs Lab 05/31/16 0500 06/01/16 0451 06/04/16 0500  AST 53* 44* 58*  ALT 40 35 53  ALKPHOS 135* 133* 212*  BILITOT 0.4 0.4 0.4  PROT  4.6* 4.3* 5.9*  ALBUMIN 1.8* 1.6* 2.0*   CBG:  Recent Labs Lab 06/03/16 2014 06/04/16 0014 06/04/16 0452 06/04/16 0810 06/04/16 1209  GLUCAP 200* 293* 274* 232* 329*    Studies: No results found.   Scheduled Meds: . antiseptic oral rinse  7 mL Mouth Rinse q12n4p  . chlorhexidine  15 mL Mouth Rinse BID  . insulin aspart  0-15 Units Subcutaneous Q4H  . magnesium sulfate 1 - 4 g bolus IVPB  2 g Intravenous Once  . phenol  1 spray Mouth/Throat BID  . sodium chloride flush  10-40 mL Intracatheter Q12H  . sodium chloride flush  3 mL Intravenous Q12H   Continuous Infusions: . sodium chloride 10 mL/hr at 06/03/16 0604  . Marland KitchenTPN (CLINIMIX-E) Adult     And  . fat emulsion     PRN Meds: acetaminophen, hydrALAZINE, ipratropium-albuterol, menthol-cetylpyridinium, ondansetron, phenol, sodium chloride flush, sodium chloride flush  Time spent: 30 minutes  Domenic Polite, MD [facility] (P) [phone]  If 7PM-7AM, please contact night-coverage at [email or web address], password [facility]

## 2016-06-05 LAB — BLOOD CULTURE ID PANEL (REFLEXED)
ACINETOBACTER BAUMANNII: NOT DETECTED
CANDIDA ALBICANS: NOT DETECTED
CANDIDA TROPICALIS: NOT DETECTED
Candida glabrata: NOT DETECTED
Candida krusei: NOT DETECTED
Candida parapsilosis: NOT DETECTED
Carbapenem resistance: NOT DETECTED
ENTEROBACTER CLOACAE COMPLEX: NOT DETECTED
ENTEROCOCCUS SPECIES: NOT DETECTED
Enterobacteriaceae species: DETECTED — AB
Escherichia coli: NOT DETECTED
HAEMOPHILUS INFLUENZAE: NOT DETECTED
Klebsiella oxytoca: NOT DETECTED
Klebsiella pneumoniae: DETECTED — AB
LISTERIA MONOCYTOGENES: NOT DETECTED
NEISSERIA MENINGITIDIS: NOT DETECTED
PROTEUS SPECIES: NOT DETECTED
Pseudomonas aeruginosa: NOT DETECTED
STAPHYLOCOCCUS SPECIES: NOT DETECTED
STREPTOCOCCUS AGALACTIAE: NOT DETECTED
STREPTOCOCCUS PNEUMONIAE: NOT DETECTED
STREPTOCOCCUS SPECIES: NOT DETECTED
Serratia marcescens: NOT DETECTED
Staphylococcus aureus (BCID): NOT DETECTED
Streptococcus pyogenes: NOT DETECTED

## 2016-06-05 LAB — GLUCOSE, CAPILLARY
GLUCOSE-CAPILLARY: 162 mg/dL — AB (ref 65–99)
GLUCOSE-CAPILLARY: 110 mg/dL — AB (ref 65–99)
GLUCOSE-CAPILLARY: 182 mg/dL — AB (ref 65–99)
Glucose-Capillary: 195 mg/dL — ABNORMAL HIGH (ref 65–99)
Glucose-Capillary: 200 mg/dL — ABNORMAL HIGH (ref 65–99)
Glucose-Capillary: 230 mg/dL — ABNORMAL HIGH (ref 65–99)
Glucose-Capillary: 147 mg/dL — ABNORMAL HIGH (ref 65–99)

## 2016-06-05 MED ORDER — DEXTROSE 5 % IV SOLN
2.0000 g | INTRAVENOUS | Status: DC
  Administered 2016-06-05 – 2016-06-13 (×9): 2 g via INTRAVENOUS
  Filled 2016-06-05 (×9): qty 2

## 2016-06-05 MED ORDER — TRACE MINERALS CR-CU-MN-SE-ZN 10-1000-500-60 MCG/ML IV SOLN
INTRAVENOUS | Status: AC
  Administered 2016-06-05: 18:00:00 via INTRAVENOUS
  Filled 2016-06-05: qty 1556

## 2016-06-05 MED ORDER — WHITE PETROLATUM GEL
Status: AC
  Administered 2016-06-05: 09:00:00
  Filled 2016-06-05: qty 1

## 2016-06-05 MED ORDER — FAT EMULSION 20 % IV EMUL
234.0000 mL | INTRAVENOUS | Status: AC
  Administered 2016-06-05: 234 mL via INTRAVENOUS
  Filled 2016-06-05: qty 250

## 2016-06-05 NOTE — Progress Notes (Signed)
[facility] Progress Note  19 Days Post-Op  Subjective: Persistent fevers. Sleepy but oriented today. Denies any pain. States her daughter should be visiting the hospital tomorrow.  Objective: Vital signs in last 24 hours: Temp:  [97.6 F (36.4 C)-97.9 F (36.6 C)] 97.6 F (36.4 C) (09/19 0431) Pulse Rate:  [67-133] 73 (09/19 0431) Resp:  [15-19] 19 (09/19 0431) BP: (128-147)/(49-61) 136/58 (09/19 0431) SpO2:  [88 %-100 %] 100 % (09/19 0431) Weight:  [78.2 kg (172 lb 4.8 oz)-80.6 kg (177 lb 12.8 oz)] 78.2 kg (172 lb 4.8 oz) (09/19 0431) Last BM Date: 06/03/16  Intake/Output from previous day: 09/18 0701 - 09/19 0700 In: 130 [I.V.:80; IV Piggyback:50] Out: 100 [Urine:100] Intake/Output this shift: No intake/output data recorded.  PE: General appearance: alert and feels febrile now GI: soft, non-tender; bowel sounds normal; no masses,  no organomegaly  Lab Results:   Recent Labs  06/03/16 0430 06/04/16 1250  WBC 8.8 8.1  HGB 8.7* 8.5*  HCT 27.1* 27.5*  PLT 95* 73*   BMET  Recent Labs  06/03/16 0430 06/04/16 0500  NA 139 141  K 3.0* 3.7  CL 104 106  CO2 28 25  GLUCOSE 177* 286*  BUN 27* 28*  CREATININE 0.72 0.83  CALCIUM 8.0* 8.4*   PT/INR No results for input(s): LABPROT, INR in the last 72 hours. CMP     Component Value Date/Time   NA 141 06/04/2016 0500   K 3.7 06/04/2016 0500   CL 106 06/04/2016 0500   CO2 25 06/04/2016 0500   GLUCOSE 286 (H) 06/04/2016 0500   BUN 28 (H) 06/04/2016 0500   CREATININE 0.83 06/04/2016 0500   CREATININE 0.78 11/07/2012 1457   CALCIUM 8.4 (L) 06/04/2016 0500   PROT 5.9 (L) 06/04/2016 0500   ALBUMIN 2.0 (L) 06/04/2016 0500   AST 58 (H) 06/04/2016 0500   ALT 53 06/04/2016 0500   ALKPHOS 212 (H) 06/04/2016 0500   BILITOT 0.4 06/04/2016 0500   GFRNONAA >60 06/04/2016 0500   GFRAA >60 06/04/2016 0500   Lipase  No results found for: LIPASE     Studies/Results: Dg Chest 1 View  Result Date:  06/04/2016 CLINICAL DATA:  Fever. EXAM: CHEST 1 VIEW COMPARISON:  06/01/2016 FINDINGS: Interval progression of bilateral airspace disease, right greater than left. The cardio pericardial silhouette is enlarged. Previously seen left pleural effusion seems decreased in the interval. Right PICC line remains in place. Telemetry leads overlie the chest. IMPRESSION: Interval progression of vascular congestion and bilateral asymmetric airspace disease. Electronically Signed   By: Misty Stanley M.D.   On: 06/04/2016 12:43    Anti-infectives: Anti-infectives    Start     Dose/Rate Route Frequency Ordered Stop   06/05/16 1030  cefTRIAXone (ROCEPHIN) 2 g in dextrose 5 % 50 mL IVPB     2 g 100 mL/hr over 30 Minutes Intravenous Every 24 hours 06/05/16 1005     06/04/16 1400  piperacillin-tazobactam (ZOSYN) IVPB 3.375 g  Status:  Discontinued     3.375 g 12.5 mL/hr over 240 Minutes Intravenous Every 8 hours 06/04/16 1235 06/05/16 1005   05/18/16 1700  anidulafungin ([name]) 100 mg in sodium chloride 0.9 % 100 mL IVPB     100 mg over 90 Minutes Intravenous Every 24 hours 05/06/2016 1627 05/30/16 1858   04/30/2016 1700  anidulafungin ([name]) 200 mg in sodium chloride 0.9 % 200 mL IVPB     200 mg over 180 Minutes Intravenous  Once 04/23/2016 1627 05/10/2016  2003   05/16/16 0200  vancomycin (VANCOCIN) IVPB 750 mg/150 ml premix  Status:  Discontinued     750 mg 150 mL/hr over 60 Minutes Intravenous Every 12 hours 04/22/2016 1409 05/16/16 1045   05/14/2016 1400  fluconazole (DIFLUCAN) IVPB 100 mg  Status:  Discontinued     100 mg 50 mL/hr over 60 Minutes Intravenous Every 24 hours 04/28/2016 1317 04/23/2016 1653   05/01/2016 1400  vancomycin (VANCOCIN) IVPB 1000 mg/200 mL premix  Status:  Discontinued     1,000 mg 200 mL/hr over 60 Minutes Intravenous  Once 05/04/2016 1353 05/06/2016 1407   04/30/2016 1330  vancomycin (VANCOCIN) IVPB 1000 mg/200 mL premix     1,000 mg 200 mL/hr over 60 Minutes Intravenous  Once 04/20/2016 1325  04/24/2016 1525   04/28/2016 1300  ceFEPIme (MAXIPIME) 2 g in dextrose 5 % 50 mL IVPB  Status:  Discontinued     2 g 100 mL/hr over 30 Minutes Intravenous Every 12 hours 05/03/2016 1257 05/16/16 1043     Assessment/Plan Acute GI bleed/ esophageal necrosis/ gastric outlet obstruction with bowel ulceration EGD 04/22/16 - Dr. Luvenia Starch showed esophageal necrosis and outlet obstruction EGD 05/04/2016 - Severe reflux esophagitis. Biopsied. Rule out malignancy, gastric tumor at the pylorus and in the prepyloric region of the stomach. Biopsied. Gastric outlet obstruction  Pathology: 1. Stomach, biopsy, pyloric channel mass - SUSPICIOUS FOR ADENOCARCINOMA. 2. Esophagus, biopsy - EXUDATE WITH FOCAL NECROSIS CONSISTENT WITH ULCER. - NO MALIGNANCY IDENTIFIED. - PAS STAIN NEGATIVE FOR FUNGUS.  *According to a note from internal medicine - Dr.Samtani d/w Dr. Excell Seltzer on 9/15 and the plan after conversation with the family was to consider a bypass type of surgery going forward.   Severe malnutrition - on prolonged TNA (04/19/16 >>); cortrak unable to be placed, would not advance beyond obstruction; pre albumin yesterday was 6.1  Fever 9/18-9/19 - UA positive for nitrites and leukocytes; Do not see a urine culture; ceftriaxone started by medicine blood cultures pending - currently detecting klebsiella pneumonia and enterobacter species Patient was started on IV Zosyn per pharmacy   FEN:  TNA, NPO ID: anidulafungin completed 14 days on 9/13; Zosyn 9/18 >>, ceftriaxone 9/19>> DVT: SCD's  Will discuss with MD. Will follow labs and cultures.   LOS: 21 days    Jill Alexanders , [facility] 06/05/2016, 10:51 AM Pager: [phone] Consults: [phone] Mon-Fri 7:00 am-4:30 pm Sat-Sun 7:00 am-11:30 am

## 2016-06-05 NOTE — Progress Notes (Signed)
CSW will continue to follow for return to [facility] when stable- per MD likely to have surgery later this week- no stable to leave prior to surgery  Jorge Ny, [place] Social Worker [phone]

## 2016-06-05 NOTE — Progress Notes (Signed)
PHARMACY - PHYSICIAN COMMUNICATION CRITICAL VALUE ALERT - BLOOD CULTURE IDENTIFICATION (BCID)  Results for orders placed or performed during the hospital encounter of 05/11/2016  Blood Culture ID Panel (Reflexed) (Collected: 06/04/2016 10:10 AM)  Result Value Ref Range   Enterococcus species NOT DETECTED NOT DETECTED   Listeria monocytogenes NOT DETECTED NOT DETECTED   Staphylococcus species NOT DETECTED NOT DETECTED   Staphylococcus aureus NOT DETECTED NOT DETECTED   Streptococcus species NOT DETECTED NOT DETECTED   Streptococcus agalactiae NOT DETECTED NOT DETECTED   Streptococcus pneumoniae NOT DETECTED NOT DETECTED   Streptococcus pyogenes NOT DETECTED NOT DETECTED   Acinetobacter baumannii NOT DETECTED NOT DETECTED   Enterobacteriaceae species DETECTED (A) NOT DETECTED   Enterobacter cloacae complex NOT DETECTED NOT DETECTED   Escherichia coli NOT DETECTED NOT DETECTED   Klebsiella oxytoca NOT DETECTED NOT DETECTED   Klebsiella pneumoniae DETECTED (A) NOT DETECTED   Proteus species NOT DETECTED NOT DETECTED   Serratia marcescens NOT DETECTED NOT DETECTED   Carbapenem resistance NOT DETECTED NOT DETECTED   Haemophilus influenzae NOT DETECTED NOT DETECTED   Neisseria meningitidis NOT DETECTED NOT DETECTED   Pseudomonas aeruginosa NOT DETECTED NOT DETECTED   Candida albicans NOT DETECTED NOT DETECTED   Candida glabrata NOT DETECTED NOT DETECTED   Candida krusei NOT DETECTED NOT DETECTED   Candida parapsilosis NOT DETECTED NOT DETECTED   Candida tropicalis NOT DETECTED NOT DETECTED    Name of physician (or Provider) Contacted: Forrest Moron, NP  Changes to prescribed antibiotics required: No changes now -continue Zosyn. Consider narrow to Rocephin 2gm IV q24h.  Sherlon Handing, PharmD, BCPS Clinical pharmacist, pager [phone] 06/05/2016  3:40 AM

## 2016-06-05 NOTE — Progress Notes (Signed)
[facility] Progress Note  Patient: [name] [name] [id number]   PCP: [name], MD DOB: 04/27/1935   DOA: 04/18/2016   DOS: 06/05/2016   Brief hospital course:  61 y/of with type II DM, GERD, Diverticulosis, HTN; admitted initially 04/15/16 with dark stools-work-up showed acute esophageal ncrosis + partial Gastric Outlet obs + pyloric mass She had a rpt EGD on 04/22/16 which confirmed no malignant cellson 05/10/2016, CC abdominal pain, shortness of breath and fever,  Re-admitted 05/16/15 sepsis and AMS-Tmax 104.5 Rpt EGD showed severe reflux + Candidal fungemia.  Patient has a gastric outlet obstruction caused by tumor and plan is to have surgery operate on her when nutritional status better  Assessment and Plan:  Gastric tumor Stomach/pyloric channel mass- SUSPICIOUS FOR ADENOCARCINOMA -CCS recommends gastrojejunal bypass  -Attending MDs d/w GI, s/p EGDs, per Dr.Perry she has obstructing mass at the level of the pylorus. As she will need definitive surgery, repeat EGD with biopsies won't change management at this time. -Gastric biopsy: within an area of inflammation and ulceration are atypical features that may represent adenoca.  To confirm the diagnosis, Dr  Patrick(pathology) feels he needs a deeper biopsy.   GI recommended that surgery to obtain a wedge biopsy of the patient gastric mass. There is no plans for further EGDs. - Failed attempt at placement for Postpyloric tube by IR due to obstruction -Dr.Samtani d/w Dr. Excell Seltzer on 9/15-the plan after conversation with the family are to consider a bypass type of surgery going forward, CCS following -continue TPN for now  -on IV lasix due to volume overload related to TNA  Sepsis Secondary to Fungemia (positive Candida glabrata) -currently afebrile and responding to therapy -completed 14 days of Anidulafungin per ID notes as of 05/30/16 -continue supportive care  Fever 9/18 -CXR with CHF/effusions and compressive  atelectasis -UA suggestive of UTI-Empiric IV Ceftriaxone -FU Blood Cx  Hyponatremia likely acute in nature/Hypomagnesemia -due to hypervolemia, continue IV lasix  Severe Protein calorie malnutrition. -In the setting of gastric cancer causing prolonged decrease oral intake  -Currently on TPN. -Pharmacy monitoring and adjusting as needed   Pleural effusions/VOlume overload -without any distress at this time, received lasix IV 9/15 will give lasix daily since on TNA, monitor Bmet  Hypokalemia -continue repletion as needed  -improved  Hypomagnesemia Continue repletion as needed , might need to replace in TPN  Normocytic anemia(Baseline Hg ~9.0) -stable, continue to monitor  HTN -stable, continue Hydralazine PRN -Echocardiogram: EF 123456, diastolic dysfunction  DM type 2 controlled without complication -0000000 Hemoglobin A1c = 6.6 -continue SSI , sugars 110-160 range -TPN with insulin  Full COde  DVT proph: SCDs, due to low platelets No family at bedside, will try to reach daughter Dispo: depending on Surgical plans   Consultants: Gen. surgery, gastroenterology and pharmacy  Procedures: TPN  Antibiotics: Anti-infectives    Start     Dose/Rate Route Frequency Ordered Stop   06/05/16 1030  cefTRIAXone (ROCEPHIN) 2 g in dextrose 5 % 50 mL IVPB     2 g 100 mL/hr over 30 Minutes Intravenous Every 24 hours 06/05/16 1005     06/04/16 1400  piperacillin-tazobactam (ZOSYN) IVPB 3.375 g  Status:  Discontinued     3.375 g 12.5 mL/hr over 240 Minutes Intravenous Every 8 hours 06/04/16 1235 06/05/16 1005   05/18/16 1700  anidulafungin ([name]) 100 mg in sodium chloride 0.9 % 100 mL IVPB     100 mg over 90 Minutes Intravenous Every 24 hours 04/24/2016 1627 05/30/16 1858  04/30/2016 1700  anidulafungin ([name]) 200 mg in sodium chloride 0.9 % 200 mL IVPB     200 mg over 180 Minutes Intravenous  Once 05/08/2016 1627 05/16/2016 2003   05/16/16 0200  vancomycin (VANCOCIN) IVPB 750  mg/150 ml premix  Status:  Discontinued     750 mg 150 mL/hr over 60 Minutes Intravenous Every 12 hours 04/30/2016 1409 05/16/16 1045   04/24/2016 1400  fluconazole (DIFLUCAN) IVPB 100 mg  Status:  Discontinued     100 mg 50 mL/hr over 60 Minutes Intravenous Every 24 hours 05/14/2016 1317 05/14/2016 1653   04/20/2016 1400  vancomycin (VANCOCIN) IVPB 1000 mg/200 mL premix  Status:  Discontinued     1,000 mg 200 mL/hr over 60 Minutes Intravenous  Once 05/12/2016 1353 05/13/2016 1407   04/18/2016 1330  vancomycin (VANCOCIN) IVPB 1000 mg/200 mL premix     1,000 mg 200 mL/hr over 60 Minutes Intravenous  Once 05/01/2016 1325 04/21/2016 1525   05/12/2016 1300  ceFEPIme (MAXIPIME) 2 g in dextrose 5 % 50 mL IVPB  Status:  Discontinued     2 g 100 mL/hr over 30 Minutes Intravenous Every 12 hours 05/07/2016 1257 05/16/16 1043        Intake/Output Summary (Last 24 hours) at 06/05/16 1343 Last data filed at 06/05/16 1213  Gross per 24 hour  Intake              180 ml  Output              125 ml  Net               55 ml   Filed Weights   06/03/16 0300 06/04/16 1335 06/05/16 0431  Weight: 84 kg (185 lb 1.5 oz) 80.6 kg (177 lb 12.8 oz) 78.2 kg (172 lb 4.8 oz)    Subjective: feels ok, no complaints, denies dyspnea  Objective: Physical Exam: Vitals:   06/04/16 1335 06/04/16 2115 06/04/16 2117 06/05/16 0431  BP: 128/61 (!) 147/49  (!) 136/58  Pulse: (!) 133 67 82 73  Resp: 15 19  19   Temp: 97.7 F (36.5 C) 97.7 F (36.5 C)  97.6 F (36.4 C)  TempSrc: Oral Oral  Oral  SpO2: 94% (!) 88% 95% 100%  Weight: 80.6 kg (177 lb 12.8 oz)   78.2 kg (172 lb 4.8 oz)  Height:        General: Alert, Awake and Oriented x2 , debilitated Eyes: PERRL, + pallor ENT: Oral Mucosa clear moist; no thrush  Neck: no bruits, no lumps. Unable to properly assess for JVD's due to body habitus Cardiovascular: S1 and S2 Present, positive systolic Murmur, Respiratory: decreased BS at bedside Abdomen: Bowel Sound present, Soft and no  tenderness Skin: no redness, no Rash  Extremities: 1plusPedal edema, no calf tenderness Neurologic: Grossly no focal neuro deficit.   Data Reviewed: CBC:  Recent Labs Lab 05/31/16 0500 06/01/16 0451 06/03/16 0430 06/04/16 1250  WBC 3.2* 3.5* 8.8 8.1  NEUTROABS 2.0 2.1  --  6.2  HGB 9.1* 8.6* 8.7* 8.5*  HCT 29.0* 27.7* 27.1* 27.5*  MCV 82.9 83.2 82.1 83.1  PLT 143* 144* 95* 73*   Basic Metabolic Panel:  Recent Labs Lab 05/30/16 0400 05/31/16 0500 06/01/16 0451 06/02/16 0532 06/03/16 0430 06/03/16 1106 06/04/16 0500  NA 131* 134* 135  --  139  --  141  K 4.3 3.7 3.7  --  3.0*  --  3.7  CL 103 104 105  --  104  --  106  CO2 23 26 26   --  28  --  25  GLUCOSE 106* 144* 165*  --  177*  --  286*  BUN 29* 23* 24*  --  27*  --  28*  CREATININE 0.65 0.68 0.63  --  0.72  --  0.83  CALCIUM 7.8* 8.0* 7.9*  --  8.0*  --  8.4*  MG 1.7 1.4*  --  1.5*  --  1.5* 1.7  PHOS 3.2 3.3  --   --   --  2.5 2.9    Liver Function Tests:  Recent Labs Lab 05/31/16 0500 06/01/16 0451 06/04/16 0500  AST 53* 44* 58*  ALT 40 35 53  ALKPHOS 135* 133* 212*  BILITOT 0.4 0.4 0.4  PROT 4.6* 4.3* 5.9*  ALBUMIN 1.8* 1.6* 2.0*   CBG:  Recent Labs Lab 06/04/16 2030 06/05/16 0111 06/05/16 0429 06/05/16 0554 06/05/16 0809  GLUCAP 155* 195* 200* 230* 162*    Studies: No results found.   Scheduled Meds: . antiseptic oral rinse  7 mL Mouth Rinse q12n4p  . cefTRIAXone (ROCEPHIN)  IV  2 g Intravenous Q24H  . chlorhexidine  15 mL Mouth Rinse BID  . furosemide  20 mg Intravenous Q12H  . insulin aspart  0-15 Units Subcutaneous Q4H  . phenol  1 spray Mouth/Throat BID  . sodium chloride flush  10-40 mL Intracatheter Q12H  . sodium chloride flush  3 mL Intravenous Q12H   Continuous Infusions: . sodium chloride 10 mL/hr at 06/03/16 0604  . Marland KitchenTPN (CLINIMIX-E) Adult     And  . fat emulsion     PRN Meds: acetaminophen, hydrALAZINE, ipratropium-albuterol, menthol-cetylpyridinium,  ondansetron, phenol, sodium chloride flush, sodium chloride flush  Time spent: 30 minutes  Domenic Polite, MD [facility] (P) [phone]  If 7PM-7AM, please contact night-coverage at [email or web address], password [facility]

## 2016-06-05 NOTE — Progress Notes (Addendum)
PARENTERAL NUTRITION CONSULT NOTE - FOLLOW UP  Pharmacy Consult:  TPN Indication:  Gastric Outlet Obstruction  Allergies  Allergen Reactions  . Ace Inhibitors Cough  . Prednisone Other (See Comments)    LETHARGY  . Sitagliptin Other (See Comments)    Noted on [facility]   Patient Measurements: Height: _0  (157.5 cm) Weight: 172 lb 4.8 oz (78.2 kg) IBW/kg (Calculated) : 50.1 Adjusted Body Weight: 75 kg Usual Weight: 80 kg  Vital Signs: Temp: 97.6 F (36.4 C) (09/19 0431) Temp Source: Oral (09/19 0431) BP: 136/58 (09/19 0431) Pulse Rate: 73 (09/19 0431) Intake/Output from previous day: 09/18 0701 - 09/19 0700 In: 130 [I.V.:80; IV Piggyback:50] Out: 100 [Urine:100] Intake/Output from this shift: No intake/output data recorded.    Insulin Requirements in the past 24 hours:  37 units moderate SSI + 30 units regular insulin in TPN  Assessment: 72 YOF recently admitted to [facility] with gastric outlet obstruction due to necrotic esophagitis and discharged on home Unasyn and TPN to [facility]. Admitted to [facility] 04/29/2016 with shortness of breath, fever, and confusion. PICC line was removed on admission due to concern for line infection - found to have fungemia. She is to continue nutrition support with TPN.  GI: Pepcid in TPN.  IR unable to place postpyloric tube d/t obstruction.  Prealbumin 3.5 > 6.1, slow rise d/t inflammation.  Bx suspicious for adenocarcinoma with plan for resection and pyloroplasty vs GJ once nutrition status improves Endo: hx DM - CBGs 150-300s while on TPN, 130s while off (insulin in TPN range: 20-50 units) Lytes: hyponatremia prior to TPN initiation.  Na added to TPN 9/10 per [facility] request - Na rising steadily and now at 141, others WNL Renal: SCr stable, BUN 28, UOP not recorded.  Cards: BP high normal, HR trending up - IV Lasix BID d/t hypervolemia Hepatobil: alk phos elevated, AST mildly elevated, tbili / TG WNL.  Expect LFTs to rise given no gut  stimulation. Pulm: RA >> 2L [place], sats 90-100s ID: Zosyn D#2 for Kleb bacteremia.  S/p 14d [name] for C.glabrata bacteremia. +BCx after PICC placed, repeat BCx negative - Tmax 102, WBC 3.5 > 8.1 Best Practices: SCDs TPN Access: PICC replaced 05/24/16 TPN start date: on TPN PTA  TPN at [facility]: (information obtained on 8/30 from [facility] - pharmacist at [place]) Clinimix E 5/15 1440 ml + 20% lipids 192 ml 35 units of regular insulin ordered for each bag - per discussion there is question if this was actually being added to each bag at the facility  Current Nutrition:  TPN  Nutritional Goals: 1500-1700 kCal and 70-80 grams of protein per day    Plan:  - Continue cyclic Clinimix E 5/36, infuse 155m over 18 hrs:  50 ml/hr x 1 hr, then 91 ml/hr x 16 hrs, then 50 ml/hr x 1 hr - Lipids 13 ml/hr x 18 hrs - TPN provides approximately 1588 kCal and 78 gm of protein per day, meeting 100% of patient's needs. - Daily multivitamin and trace elements in TPN - Continue moderate SSI Q4H + increase to 40 units of regular insulin in TPN - Pepcid 462min TPN - Add 6658mNaCL to make Clinimix 1/2NS concentration (sodium content 77 mEq/L) - F/U volume status and CBGs, may need to transition back to continuous infusion of TPN - F/U micro data to narrow abx, BMET on Wed   Nesbit Michon D. DanMina MarbleharmD, BCPS Pager:  [phone]/2017, 7:56 AM

## 2016-06-06 LAB — GLUCOSE, CAPILLARY
Glucose-Capillary: 107 mg/dL — ABNORMAL HIGH (ref 65–99)
Glucose-Capillary: 110 mg/dL — ABNORMAL HIGH (ref 65–99)
Glucose-Capillary: 134 mg/dL — ABNORMAL HIGH (ref 65–99)
Glucose-Capillary: 156 mg/dL — ABNORMAL HIGH (ref 65–99)
Glucose-Capillary: 159 mg/dL — ABNORMAL HIGH (ref 65–99)
Glucose-Capillary: 163 mg/dL — ABNORMAL HIGH (ref 65–99)
Glucose-Capillary: 173 mg/dL — ABNORMAL HIGH (ref 65–99)

## 2016-06-06 LAB — CBC
HEMATOCRIT: 28.5 % — AB (ref 36.0–46.0)
HEMOGLOBIN: 8.9 g/dL — AB (ref 12.0–15.0)
MCH: 25.7 pg — ABNORMAL LOW (ref 26.0–34.0)
MCHC: 31.2 g/dL (ref 30.0–36.0)
MCV: 82.4 fL (ref 78.0–100.0)
Platelets: 111 10*3/uL — ABNORMAL LOW (ref 150–400)
RBC: 3.46 MIL/uL — ABNORMAL LOW (ref 3.87–5.11)
RDW: 15 % (ref 11.5–15.5)
WBC: 7.4 10*3/uL (ref 4.0–10.5)

## 2016-06-06 LAB — BASIC METABOLIC PANEL
Anion gap: 9 (ref 5–15)
BUN: 31 mg/dL — AB (ref 6–20)
CALCIUM: 8 mg/dL — AB (ref 8.9–10.3)
CO2: 29 mmol/L (ref 22–32)
CREATININE: 0.85 mg/dL (ref 0.44–1.00)
Chloride: 103 mmol/L (ref 101–111)
GFR calc Af Amer: >60 mL/min (ref >60)
GLUCOSE: 124 mg/dL — AB (ref 65–99)
Potassium: 2.8 mmol/L — ABNORMAL LOW (ref 3.5–5.1)
Sodium: 141 mmol/L (ref 135–145)

## 2016-06-06 MED ORDER — TRACE MINERALS CR-CU-MN-SE-ZN 10-1000-500-60 MCG/ML IV SOLN
INTRAVENOUS | Status: AC
  Administered 2016-06-06: 18:00:00 via INTRAVENOUS
  Filled 2016-06-06: qty 1556

## 2016-06-06 MED ORDER — POTASSIUM CHLORIDE 10 MEQ/50ML IV SOLN
10.0000 meq | INTRAVENOUS | Status: AC
  Administered 2016-06-06 (×6): 10 meq via INTRAVENOUS
  Filled 2016-06-06 (×6): qty 50

## 2016-06-06 MED ORDER — FAT EMULSION 20 % IV EMUL
234.0000 mL | INTRAVENOUS | Status: AC
  Administered 2016-06-06: 234 mL via INTRAVENOUS
  Filled 2016-06-06: qty 250

## 2016-06-06 NOTE — Care Management Important Message (Signed)
Important Message  Patient Details  Name: [name] [name] MRN: [id number] Date of Birth: [birth year]   Medicare Important Message Given:  Yes    Abbigail Anstey Abena 06/06/2016, 9:40 AM

## 2016-06-06 NOTE — Progress Notes (Signed)
Nutrition Follow-up  DOCUMENTATION CODES:   Obesity unspecified  INTERVENTION:   -TPN management per pharmacy  NUTRITION DIAGNOSIS:   Inadequate oral intake related to inability to eat as evidenced by NPO status.  Ongoing  GOAL:   Patient will meet greater than or equal to 90% of their needs  Met with TPN  MONITOR:   Diet advancement, Labs, Weight trends, Skin, I & O's  REASON FOR ASSESSMENT:   Consult New TPN/TNA  ASSESSMENT:    81 y.o. Female wiht PMH of degenerative disc disease, diabetes, diverticulosis, GERD, hiatal hernia, HLD, HTN, chronic hyponatremia recently admitted from 04/14/2016 until 04/27/2016 for treatment of acute GI bleed with gastric outlet obstruction, necrotic esophagitis with superficial fungal infection and ulceration with subsequent development of possible right-sided pneumonitis and aspiration pneumonia. Patient was discharged on Unasyn and completed her course on 05/14/2016.[name] Patient with left arm PICC in place since time of discharge for Unasyn and TPN administration. Patient's only complaint at this point time is shortness of breath and fevers. Denies worsening lower extremity edema, chest pain, palpitations, dysuria. Patient unable to provide further history.  TF was d/c on 05/25/16 and feeding tube removed due to inability to place tube past obstruction. Pt remains NPO, on TPN.   Reviewed pharmacy note; pt transitioned to cyclic TPN on 05/16/55. Plan to continue cyclic TPN E 2/13, infuse 1561m over 18 hrs: 50 ml/hr x 1 hr, then 91 ml/hr x 16 hrs, then 50 ml/hr x 1 hr(will continue 18 hour cycle today and watch CBGs closely). Lipids 13 ml/hr x 18 hrs. TPN provides approximately 1588 kcals and 78 grams of protein per day, meeting 100% of patient's needs.  Per MD notes, pt with gastric outlet obstruction caused by tumor. Per surgical service notes, pt is not a candidate for surgery due to poor nutritional status. Medical team has had ongoing goals  of care discussions with family; family now leaning towards a palliative approach. Per surgical service notes, to consult palliative care.   Review[facility] note[facility]   Labs reviewed: K: 2.8 (on IV supplementation), CBGS: 107-173.  Diet Order:  Diet NPO time specified ADULT TPN TPN (CLINIMIX-E) Adult  Skin:  Reviewed, no issues  Last BM:  06/03/16  Height:   Ht Readings from Last 1 Encounters:  05/07/2016 _0  (1.575 m)    Weight:   Wt Readings from Last 1 Encounters:  06/06/16 163 lb 6.4 oz (74.1 kg)    Ideal Body Weight:  50 kg  BMI:  Body mass index is 29.89 kg/m.  Estimated Nutritional Needs:   Kcal:  1500-1700  Protein:  70-80 gm  Fluid:  1.5-1.7 L  EDUCATION NEEDS:   No education needs identified at this time  [name] Ellen A. WJimmye [name] RD, LDN, CDE Pager: [phone] hours Pager: [phone]

## 2016-06-06 NOTE — Progress Notes (Signed)
Patient ID: [name] [name], female   DOB: 02-Oct-1934, 81 y.o.   MRN: [id number]   LOS: 22 days   Subjective: Sleeping, I did not wake her up.   Objective: Vital signs in last 24 hours: Temp:  [97.8 F (36.6 C)-98.1 F (36.7 C)] 98.1 F (36.7 C) (09/20 0847) Pulse Rate:  [71-83] 83 (09/20 0847) Resp:  [18-20] 20 (09/20 0847) BP: (118-135)/(49-64) 125/64 (09/20 0847) SpO2:  [92 %-100 %] 92 % (09/19 2220) Weight:  [74.1 kg (163 lb 6.4 oz)] 74.1 kg (163 lb 6.4 oz) (09/20 0500) Last BM Date: 06/03/16   Laboratory  CBC  Recent Labs  06/04/16 1250 06/06/16 0520  WBC 8.1 7.4  HGB 8.5* 8.9*  HCT 27.5* 28.5*  PLT 73* 111*   BMET  Recent Labs  06/04/16 0500 06/06/16 0520  NA 141 141  K 3.7 2.8*  CL 106 103  CO2 25 29  GLUCOSE 286* 124*  BUN 28* 31*  CREATININE 0.83 0.85  CALCIUM 8.4* 8.0*   CBG (last 3)   Recent Labs  06/06/16 0022 06/06/16 0409 06/06/16 0801  GLUCAP 173* 159* 134*    Physical Exam General appearance: no distress Resp: clear to auscultation bilaterally Cardio: regular rate and rhythm GI: Soft   Assessment/Plan: Acute GI bleed/ esophageal necrosis/ gastric outlet obstruction with bowel ulceration EGD 04/22/16 - Dr. Luvenia Starch showed esophageal necrosis and outlet obstruction EGD 05/01/2016 - Severe reflux esophagitis. Biopsied. Rule out malignancy, gastric tumor at the pylorus and in the prepyloric region of the stomach. Biopsied. Gastric outlet obstruction  Pathology: 1. Stomach, biopsy, pyloric channel mass - SUSPICIOUS FOR ADENOCARCINOMA. 2. Esophagus, biopsy - EXUDATE WITH FOCAL NECROSIS CONSISTENT WITH ULCER. - NO MALIGNANCY IDENTIFIED. - PAS STAIN NEGATIVE FOR FUNGUS.  *According to a note from internal medicine - Dr.Samtani d/w Dr. Excell Seltzer on 9/15 and the plan after conversation with the family was to consider a bypass type of surgery going forward.   Severe malnutrition - on prolonged TNA (04/19/16 >>); cortrak unable to be  placed, would not advance beyond obstruction  FEN:  TNA, NPO Dispo -- Would recommend formal palliative care consult given sister's comments. No role for surgery at this point in her nutritional status.    Lisette Abu, PA-C Pager: [phone] 06/06/2016

## 2016-06-06 NOTE — Progress Notes (Addendum)
PARENTERAL NUTRITION CONSULT NOTE - FOLLOW UP  Pharmacy Consult:  TPN Indication:  Gastric Outlet Obstruction  Allergies  Allergen Reactions  . Ace Inhibitors Cough  . Prednisone Other (See Comments)    LETHARGY  . Sitagliptin Other (See Comments)    Noted on [facility]   Patient Measurements: Height: '5\' 2"'$  (157.5 cm) Weight: 163 lb 6.4 oz (74.1 kg) IBW/kg (Calculated) : 50.1 Adjusted Body Weight: 75 kg Usual Weight: 80 kg  Vital Signs: Temp: 98 F (36.7 C) (09/19 2220) Temp Source: Oral (09/19 2220) BP: 135/49 (09/19 2220) Pulse Rate: 82 (09/19 2220) Intake/Output from previous day: 09/19 0701 - 09/20 0700 In: 1215.2 [I.V.:1165.2; IV Piggyback:50] Out: 625 [Urine:625] Intake/Output from this shift: No intake/output data recorded.    Insulin Requirements in the past 24 hours:  16 units moderate SSI + 40 units regular insulin in TPN  Assessment: 75 YOF recently admitted to [facility] with gastric outlet obstruction due to necrotic esophagitis and discharged on home Unasyn and TPN to [facility]. Admitted to [facility] 04/19/2016 with shortness of breath, fever, and confusion. PICC line was removed on admission due to concern for line infection - found to have fungemia. She is to continue nutrition support with TPN.  GI: Pepcid in TPN.  IR unable to place postpyloric tube d/t obstruction.  Prealbumin 3.5 > 6.1, slow rise d/t inflammation.  Bx suspicious for adenocarcinoma with plan for resection and pyloroplasty vs GJ once nutrition status improves, but may pursue palliative approach Endo: hx DM - CBGs currently controlled, have been quite variable and required frequent insulin adjustment in TPN (insulin in TPN range: 20-50 units) Lytes: hyponatremia prior to TPN initiation.  Na added to TPN 9/10 per [facility] request - Na plateauing with reduced NaCL added to TPN, K+ 2.8, others WNL Renal: SCr stable, BUN 31, net+15L since admit. Cards: VSS - IV Lasix BID d/t hypervolemia Hepatobil: alk phos  elevated, AST mildly elevated, tbili / TG WNL.  Expect LFTs to rise given no gut stimulation. Pulm: back on RA, sats 90-100s ID: Zosyn>CTX D#3 for Kleb bacteremia.  S/p 14d [name] for C.glabrata bacteremia - now afebrile, WBC trending down Best Practices: SCDs TPN Access: PICC replaced 05/24/16 TPN start date: on TPN PTA (since 04/19/16 per Surgery)  TPN at [facility]: (information obtained on 8/30 from [facility] - pharmacist at [place]) Clinimix E 5/15 1440 ml + 20% lipids 192 ml 35 units of regular insulin ordered for each bag - per discussion there is question if this was actually being added to each bag at the facility  Current Nutrition:  TPN  Nutritional Goals: 1500-1700 kCal and 70-80 grams of protein per day    Plan:  - Continue cyclic Clinimix E 8/92, infuse 1522m over 18 hrs:  50 ml/hr x 1 hr, then 91 ml/hr x 16 hrs, then 50 ml/hr x 1 hr - Lipids 13 ml/hr x 18 hrs - TPN provides approximately 1588 kCal and 78 gm of protein per day, meeting 100% of patient's needs. - Daily multivitamin and trace elements in TPN - Continue moderate SSI Q4H + 40 units of regular insulin in TPN - Pepcid '40mg'$  in TPN - Continue to add 665m NaCL to make Clinimix 1/2NS concentration (sodium content 77 mEq/L) - F/U volume status and CBGs.  Consider another trial of cyclic TPN over 16 hours. - KCL x6 runs.  May benefit from daily KCL runs while on Lasix. - F/U AM labs   Ekta Dancer D. DaMina MarblePharmD,  BCPS Pager:  [phone] 06/06/2016, 7:46 AM

## 2016-06-06 NOTE — Progress Notes (Addendum)
[facility] Progress Note  Patient: [name] [name] [id number]   PCP: [name], MD DOB: 04-Mar-1935   DOA: 05/09/2016   DOS: 06/06/2016   Brief hospital course:  64 y/of with type II DM, GERD, Diverticulosis, HTN; admitted initially 04/15/16 with dark stools-work-up showed acute esophageal ncrosis + partial Gastric Outlet obs + pyloric mass She had a rpt EGD on 04/22/16 which confirmed no malignant cellson 05/17/2016,  Re-admitted 05/16/15 sepsis and AMS-Tmax 104.5 Rpt EGD showed severe reflux + Candidal fungemia.  Patient has a gastric outlet obstruction caused by tumor and plan was to have Surgery operate on her when nutritional status better. Path from 8/31 highly suspicious for gastric adeno CA Febrile and now with klebsiella bacteremia and UTI, [facility]CS today recommends Palliative medicine Remains on TNA for weeks and prealbumin still 6 from 2days ago  Assessment and Plan:  Gastric tumor Stomach/pyloric channel mass- SUSPICIOUS FOR ADENOCARCINOMA -CCS recommends gastrojejunal bypass  -Attending MDs d/w GI, s/p EGDs, per Dr.Perry she has obstructing mass at the level of the pylorus. As she will need definitive surgery, repeat EGD with biopsies won't change management at this time. -Gastric biopsy: within an area of inflammation and ulceration are atypical features that may represent adenoca.  To confirm the diagnosis, Dr  Patrick(pathology) feels he needs a deeper biopsy.   GI recommended that surgery to obtain a wedge biopsy of the patient gastric mass. There is no plans for further EGDs. - Failed attempt at placement for Postpyloric tube by IR due to obstruction -Dr.Samtani d/w Dr. Excell Seltzer on 9/15-the plan after conversation with the family were to consider a bypass type of surgery going forward, CCS following-CCS today is recommending Palliative consult .. Dr.Thompson, d/w one of the daughters yesterday -remains on TPN, pre-albumin 6 -has been on IV lasix due to volume overload  related to TNA, appears euvolemic now, hold this -palliative consult requested  Sepsis Secondary to Fungemia (positive Candida glabrata) -currently afebrile and responding to therapy -completed 14 days of Anidulafungin per ID notes as of 05/30/16 -continue supportive care  UTI/ klebsiella bacteremia -Febrile to 102 yesterday -CXR with CHF/effusions and compressive atelectasis -urine Cx with GNR, Blood Cx with Klebsiella 1/2 likely from urine -continue empiric IV Ceftriaxone, FU Sensitivities  Hyponatremia likely acute in nature/Hypomagnesemia -due to hypervolemia, hold IV lasix today  Severe Protein calorie malnutrition. -In the setting of gastric cancer causing prolonged decrease oral intake and gastric obstruction -Currently on TPN per pharmacy -last prealbumin still 6 only  Pleural effusions/VOlume overload -without any distress at this time, received lasix IV 9/15then daily lasix since on TNA, monitor Bmet  Hypokalemia -continue repletion as needed  -improved  Hypomagnesemia Continue repletion as needed , might need to replace in TPN  Normocytic anemia(Baseline Hg ~9.0) -stable, continue to monitor  HTN -stable, continue Hydralazine PRN -Echocardiogram: EF [id number], diastolic dysfunction  DM type 2 controlled without complication -0000000 Hemoglobin A1c = 6.6 -continue SSI , sugars 110-160 range -TPN with insulin  Full COde  DVT proph: SCDs, due to low platelets No family at bedside, called and d/w daughter Clint Guy 9/20 Dispo: depending on Palliative consult   Consultants: Gen. surgery, gastroenterology and pharmacy  Procedures: TPN  Antibiotics: Anti-infectives    Start     Dose/Rate Route Frequency Ordered Stop   06/05/16 1030  cefTRIAXone (ROCEPHIN) 2 g in dextrose 5 % 50 mL IVPB     2 g 100 mL/hr over 30 Minutes Intravenous Every 24 hours 06/05/16 1005  06/04/16 1400  piperacillin-tazobactam (ZOSYN) IVPB 3.375 g  Status:  Discontinued      3.375 g 12.5 mL/hr over 240 Minutes Intravenous Every 8 hours 06/04/16 1235 06/05/16 1005   05/18/16 1700  anidulafungin ([name]) 100 mg in sodium chloride 0.9 % 100 mL IVPB     100 mg over 90 Minutes Intravenous Every 24 hours 04/19/2016 1627 05/30/16 1858   04/21/2016 1700  anidulafungin ([name]) 200 mg in sodium chloride 0.9 % 200 mL IVPB     200 mg over 180 Minutes Intravenous  Once 05/11/2016 1627 04/18/2016 2003   05/16/16 0200  vancomycin (VANCOCIN) IVPB 750 mg/150 ml premix  Status:  Discontinued     750 mg 150 mL/hr over 60 Minutes Intravenous Every 12 hours 05/17/2016 1409 05/16/16 1045   05/07/2016 1400  fluconazole (DIFLUCAN) IVPB 100 mg  Status:  Discontinued     100 mg 50 mL/hr over 60 Minutes Intravenous Every 24 hours 05/05/2016 1317 04/30/2016 1653   05/12/2016 1400  vancomycin (VANCOCIN) IVPB 1000 mg/200 mL premix  Status:  Discontinued     1,000 mg 200 mL/hr over 60 Minutes Intravenous  Once 04/19/2016 1353 05/09/2016 1407   04/19/2016 1330  vancomycin (VANCOCIN) IVPB 1000 mg/200 mL premix     1,000 mg 200 mL/hr over 60 Minutes Intravenous  Once 04/19/2016 1325 05/02/2016 1525   05/13/2016 1300  ceFEPIme (MAXIPIME) 2 g in dextrose 5 % 50 mL IVPB  Status:  Discontinued     2 g 100 mL/hr over 30 Minutes Intravenous Every 12 hours 05/04/2016 1257 05/16/16 1043        Intake/Output Summary (Last 24 hours) at 06/06/16 1330 Last data filed at 06/06/16 1300  Gross per 24 hour  Intake          1175.19 ml  Output             1000 ml  Net           175.19 ml   Filed Weights   06/04/16 1335 06/05/16 0431 06/06/16 0500  Weight: 80.6 kg (177 lb 12.8 oz) 78.2 kg (172 lb 4.8 oz) 74.1 kg (163 lb 6.4 oz)    Subjective: feels ok, no complaints, denies dyspnea  Objective: Physical Exam: Vitals:   06/05/16 2220 06/06/16 0500 06/06/16 0847 06/06/16 1324  BP: (!) 135/49  125/64 (!) 121/49  Pulse: 82  83 78  Resp: 18  20 20   Temp: 98 F (36.7 C)  98.1 F (36.7 C) 97.9 F (36.6 C)  TempSrc: Oral  Oral  Oral  SpO2: 92%   100%  Weight:  74.1 kg (163 lb 6.4 oz)    Height:        General: Alert, Awake and Oriented x2 , debilitated Eyes: PERRL, + pallor ENT: Oral Mucosa clear moist; no thrush  Neck: no bruits, no lumps. Unable to properly assess for JVD's due to body habitus Cardiovascular: S1 and S2 Present, positive systolic Murmur, Respiratory: decreased BS at bedside Abdomen: Bowel Sound present, Soft and no tenderness Skin: no redness, no Rash  Extremities: no edema, no calf tenderness Neurologic: Grossly no focal neuro deficit.   Data Reviewed: CBC:  Recent Labs Lab 05/31/16 0500 06/01/16 0451 06/03/16 0430 06/04/16 1250 06/06/16 0520  WBC 3.2* 3.5* 8.8 8.1 7.4  NEUTROABS 2.0 2.1  --  6.2  --   HGB 9.1* 8.6* 8.7* 8.5* 8.9*  HCT 29.0* 27.7* 27.1* 27.5* 28.5*  MCV 82.9 83.2 82.1 83.1 82.4  PLT 143* 144*  95* 73* [id number]*   Basic Metabolic Panel:  Recent Labs Lab 05/31/16 0500 06/01/16 0451 06/02/16 0532 06/03/16 0430 06/03/16 1106 06/04/16 0500 06/06/16 0520  NA 134* 135  --  139  --  141 141  K 3.7 3.7  --  3.0*  --  3.7 2.8*  CL 104 105  --  104  --  106 103  CO2 26 26  --  28  --  25 29  GLUCOSE 144* 165*  --  177*  --  286* 124*  BUN 23* 24*  --  27*  --  28* 31*  CREATININE 0.68 0.63  --  0.72  --  0.83 0.85  CALCIUM 8.0* 7.9*  --  8.0*  --  8.4* 8.0*  MG 1.4*  --  1.5*  --  1.5* 1.7  --   PHOS 3.3  --   --   --  2.5 2.9  --     Liver Function Tests:  Recent Labs Lab 05/31/16 0500 06/01/16 0451 06/04/16 0500  AST 53* 44* 58*  ALT 40 35 53  ALKPHOS 135* 133* 212*  BILITOT 0.4 0.4 0.4  PROT 4.6* 4.3* 5.9*  ALBUMIN 1.8* 1.6* 2.0*   CBG:  Recent Labs Lab 06/05/16 2009 06/06/16 0022 06/06/16 0409 06/06/16 0801 06/06/16 1142  GLUCAP 182* 173* 159* 134* 107*    Studies: No results found.   Scheduled Meds: . antiseptic oral rinse  7 mL Mouth Rinse q12n4p  . cefTRIAXone (ROCEPHIN)  IV  2 g Intravenous Q24H  . chlorhexidine  15 mL Mouth  Rinse BID  . furosemide  20 mg Intravenous Q12H  . insulin aspart  0-15 Units Subcutaneous Q4H  . phenol  1 spray Mouth/Throat BID  . potassium chloride  10 mEq Intravenous Q1 Hr x 6  . sodium chloride flush  10-40 mL Intracatheter Q12H  . sodium chloride flush  3 mL Intravenous Q12H   Continuous Infusions: . sodium chloride 10 mL/hr at 06/03/16 0604  . Marland KitchenTPN (CLINIMIX-E) Adult     And  . fat emulsion     PRN Meds: acetaminophen, hydrALAZINE, ipratropium-albuterol, menthol-cetylpyridinium, ondansetron, phenol, sodium chloride flush, sodium chloride flush  Time spent: 30 minutes  Domenic Polite, MD [facility] (P) [phone]  If 7PM-7AM, please contact night-coverage at [email or web address], password [facility]

## 2016-06-07 DIAGNOSIS — E871 Hypo-osmolality and hyponatremia: Secondary | ICD-10-CM | POA: Diagnosis present

## 2016-06-07 DIAGNOSIS — J9601 Acute respiratory failure with hypoxia: Secondary | ICD-10-CM | POA: Diagnosis present

## 2016-06-07 DIAGNOSIS — A498 Other bacterial infections of unspecified site: Secondary | ICD-10-CM

## 2016-06-07 DIAGNOSIS — E119 Type 2 diabetes mellitus without complications: Secondary | ICD-10-CM

## 2016-06-07 DIAGNOSIS — R410 Disorientation, unspecified: Secondary | ICD-10-CM | POA: Diagnosis present

## 2016-06-07 DIAGNOSIS — R7881 Bacteremia: Secondary | ICD-10-CM | POA: Diagnosis present

## 2016-06-07 DIAGNOSIS — B961 Klebsiella pneumoniae [K. pneumoniae] as the cause of diseases classified elsewhere: Secondary | ICD-10-CM

## 2016-06-07 LAB — CULTURE, BLOOD (ROUTINE X 2)

## 2016-06-07 LAB — COMPREHENSIVE METABOLIC PANEL
ALK PHOS: 146 U/L — AB (ref 38–126)
ALT: 36 U/L (ref 14–54)
ANION GAP: 6 (ref 5–15)
AST: 51 U/L — ABNORMAL HIGH (ref 15–41)
Albumin: 1.7 g/dL — ABNORMAL LOW (ref 3.5–5.0)
BILIRUBIN TOTAL: 0.4 mg/dL (ref 0.3–1.2)
BUN: 33 mg/dL — ABNORMAL HIGH (ref 6–20)
CALCIUM: 7.9 mg/dL — AB (ref 8.9–10.3)
CO2: 31 mmol/L (ref 22–32)
Chloride: 104 mmol/L (ref 101–111)
Creatinine, Ser: 0.78 mg/dL (ref 0.44–1.00)
GFR calc non Af Amer: >60 mL/min (ref >60)
GLUCOSE: 133 mg/dL — AB (ref 65–99)
Potassium: 3.5 mmol/L (ref 3.5–5.1)
Sodium: 141 mmol/L (ref 135–145)
TOTAL PROTEIN: 5 g/dL — AB (ref 6.5–8.1)

## 2016-06-07 LAB — GLUCOSE, CAPILLARY
GLUCOSE-CAPILLARY: 127 mg/dL — AB (ref 65–99)
GLUCOSE-CAPILLARY: 129 mg/dL — AB (ref 65–99)
GLUCOSE-CAPILLARY: 146 mg/dL — AB (ref 65–99)
Glucose-Capillary: 117 mg/dL — ABNORMAL HIGH (ref 65–99)
Glucose-Capillary: 145 mg/dL — ABNORMAL HIGH (ref 65–99)

## 2016-06-07 LAB — MAGNESIUM: Magnesium: 1.6 mg/dL — ABNORMAL LOW (ref 1.7–2.4)

## 2016-06-07 LAB — URINE CULTURE: Culture: >=100000 — AB

## 2016-06-07 LAB — PHOSPHORUS: Phosphorus: 3.9 mg/dL (ref 2.5–4.6)

## 2016-06-07 MED ORDER — TRACE MINERALS CR-CU-MN-SE-ZN 10-1000-500-60 MCG/ML IV SOLN
INTRAVENOUS | Status: AC
  Administered 2016-06-07: 18:00:00 via INTRAVENOUS
  Filled 2016-06-07: qty 1556

## 2016-06-07 MED ORDER — MAGNESIUM SULFATE 50 % IJ SOLN
3.0000 g | Freq: Once | INTRAVENOUS | Status: AC
  Administered 2016-06-07: 3 g via INTRAVENOUS
  Filled 2016-06-07: qty 6

## 2016-06-07 MED ORDER — MAGNESIUM SULFATE 2 GM/50ML IV SOLN
2.0000 g | Freq: Once | INTRAVENOUS | Status: AC
  Administered 2016-06-07: 2 g via INTRAVENOUS
  Filled 2016-06-07: qty 50

## 2016-06-07 MED ORDER — FAT EMULSION 20 % IV EMUL
234.0000 mL | INTRAVENOUS | Status: AC
  Administered 2016-06-07: 234 mL via INTRAVENOUS
  Filled 2016-06-07 (×2): qty 250

## 2016-06-07 MED ORDER — POTASSIUM CHLORIDE 10 MEQ/50ML IV SOLN
10.0000 meq | INTRAVENOUS | Status: AC
  Administered 2016-06-07 (×3): 10 meq via INTRAVENOUS
  Filled 2016-06-07 (×3): qty 50

## 2016-06-07 NOTE — Progress Notes (Signed)
Occupational Therapy Treatment Patient Details Name: [name] [name] MRN: [id number] DOB: 02-Jan-1935 Today's Date: 06/07/2016    History of present illness 81 y.o.femalewith history of degenerative disc disease, DM, diverticulosis, GERD, hiatal hernia, HLD, HTN, and chronic hyponatremia who was admitted 04/14/2016 >04/27/2016 for treatment of acute GI bleed with gastric outlet obstruction due to necrotic esophagitis with superficial fungal infection and ulceration with subsequent development of possible right-sided pneumonitis and aspiration pneumonia. She returned to the ED c/o shortness of breath, fevers, and confusion. +sepsis (?due to PICC)   OT comments  Pt making progress with functional goals. Pt more alert today and following commands much better. OT will continue to follow  Follow Up Recommendations  SNF    Equipment Recommendations  None recommended by OT    Recommendations for Other Services      Precautions / Restrictions Precautions Precautions: Fall Restrictions Weight Bearing Restrictions: No       Mobility Bed Mobility Overal bed mobility: Needs Assistance Bed Mobility: Sit to Supine       Sit to supine: Mod assist;HOB elevated   General bed mobility comments: mod A with LEs back onto bed and max A to scoot to EOB  Transfers Overall transfer level: Needs assistance Equipment used: Rolling walker (2 wheeled) Transfers: Sit to/from Stand Sit to Stand: Mod assist Stand pivot transfers: Mod assist            Balance Overall balance assessment: Needs assistance   Sitting balance-Leahy Scale: Fair       Standing balance-Leahy Scale: Poor                     ADL       Grooming: Sitting;Wash/dry face;Wash/dry hands;Brushing hair;Set up;Supervision/safety   Upper Body Bathing: Sitting;Min guard       Upper Body Dressing : Sitting;Min guard       Toilet Transfer: BSC;RW;Moderate assistance   Toileting- Clothing Manipulation  and Hygiene: Sit to/from stand;Moderate assistance       Functional mobility during ADLs: Minimal assistance;Rolling walker;Cueing for safety        Vision  no change from baseline                              Cognition   Behavior During Therapy: Impulsive Overall Cognitive Status: Within Functional Limits for tasks assessed Area of Impairment: Following commands;Safety/judgement;Awareness   Current Attention Level: Sustained          Problem Solving: Requires verbal cues;Requires tactile cues      Extremity/Trunk Assessment    generalized weakness                        General Comments  pt pleasant and cooperative    Pertinent Vitals/ Pain       Pain Assessment: No/denies pain  Home Living  home alone                                        Prior Functioning/Environment  independent           Frequency  Min 2X/week        Progress Toward Goals  OT Goals(current goals can now be found in the care plan section)  Progress towards OT goals: Progressing toward goals     Plan Discharge plan remains  appropriate                     End of Session Equipment Utilized During Treatment: Rolling walker;Gait belt;Other (comment) (BSC)   Activity Tolerance Patient tolerated treatment well   Patient Left with call bell/phone within reach;in bed             Time: [id number] OT Time Calculation (min): 29 min  Charges: OT General Charges $OT Visit: 1 Procedure OT Treatments $Self Care/Home Management : 8-22 mins $Therapeutic Activity: 8-22 mins  Britt Bottom 06/07/2016, 2:36 PM

## 2016-06-07 NOTE — Progress Notes (Signed)
PARENTERAL NUTRITION CONSULT NOTE - FOLLOW UP  Pharmacy Consult for TPN Indication: Gastric Outlet Obstruction  Allergies  Allergen Reactions  . Ace Inhibitors Cough  . Prednisone Other (See Comments)    LETHARGY  . Sitagliptin Other (See Comments)    Noted on [facility]    Patient Measurements: Height: '5\' 2"'$  (157.5 cm) Weight: 168 lb 12.8 oz (76.6 kg) IBW/kg (Calculated) : 50.1 Adjusted Body Weight: 75 Usual Weight: 80  Vital Signs: Temp: 98 F (36.7 C) (09/21 0725) Temp Source: Oral (09/20 2201) BP: 137/56 (09/21 0725) Pulse Rate: 84 (09/21 0725) Intake/Output from previous day: 09/20 0701 - 09/21 0700 In: 1189.2 [I.V.:1189.2] Out: 700 [Urine:700] Intake/Output from this shift: No intake/output data recorded.  Labs:  Recent Labs  06/04/16 1250 06/06/16 0520  WBC 8.1 7.4  HGB 8.5* 8.9*  HCT 27.5* 28.5*  PLT 73* 111*     Recent Labs  06/06/16 0520 06/07/16 0500  NA 141  --   K 2.8*  --   CL 103  --   CO2 29  --   GLUCOSE 124*  --   BUN 31*  --   CREATININE 0.85  --   CALCIUM 8.0*  --   MG  --  1.6*  PHOS  --  3.9   Estimated Creatinine Clearance: 52.3 mL/min (by C-G formula based on SCr of 0.85 mg/dL).    Recent Labs  06/06/16 2356 06/07/16 0404 06/07/16 0811  GLUCAP 163* 127* 117*    Medications:  Scheduled:  . antiseptic oral rinse  7 mL Mouth Rinse q12n4p  . cefTRIAXone (ROCEPHIN)  IV  2 g Intravenous Q24H  . chlorhexidine  15 mL Mouth Rinse BID  . insulin aspart  0-15 Units Subcutaneous Q4H  . phenol  1 spray Mouth/Throat BID  . sodium chloride flush  10-40 mL Intracatheter Q12H  . sodium chloride flush  3 mL Intravenous Q12H    Insulin Requirements in the past 24 hours:  10 units moderate SSI + 40 units regular insulin in TPN  Assessment: 73 YOF recently admitted to [facility] with gastric outlet obstruction due to necrotic esophagitis and discharged on home Unasyn and TPN to [facility]. Ad[facility] to [facility] 05/07/2016 with shortness of  breath, fever, and confusion. PICC line was removed on admission due to concern for line infection - found to have fungemia. She is to continue nutrition support with TPN.  GI: Pepcid in TPN.  IR unable to place postpyloric tube d/t obstruction.  Prealbumin 3.5 > 6.1, slow rise d/t inflammation.  Bx suspicious for adenocarcinoma with plan for resection and pyloroplasty vs GJ once nutritional status improves, but may pursue palliative approach Endo: hx DM - CBGs currently controlled, have been quite variable and required frequent insulin adjustment in TPN (insulin in TPN range: 20-50 units) Lytes: hyponatremia prior to TPN initiation.  Na added to TPN 9/10 per TRH request - Na plateauing with NaCL added to TPN.  K+ 3.5, Mg 1.6, others WNL Renal: SCr stable, BUN 31, net+15.5L since admit. Cards: VSS - IV Lasix BID d/t hypervolemia- stopped 9/20. Hepatobil: alk phos elevated, AST mildly elevated, tbili / TG WNL.  Expect LFTs to rise given no gut stimulation. Pulm: back on RA, sats 90-100s ID: Zosyn>CTX D#3 for Kleb bacteremia.  S/p 14d [name] for C.glabrata bacteremia - now afebrile, WBC trending down Best Practices: SCDs TPN Access: PICC replaced 05/24/16 TPN start date: on TPN PTA (since 04/19/16 per Surgery)  TPN at [facility]: (information  obtained on 8/30 from [facility] - pharmacist at [facility]) Clinimix E 5/15 1440 ml + 20% lipids 192 ml 35 units of regular insulin ordered for each bag - per discussion there is question if this was actually being added to each bag at the facility  Current Nutrition:  TPN  Nutritional Goals: 1500-1700 kCal and 70-80 grams of protein per day    Plan:  - Continue cyclic Clinimix E 2/90, infuse 159m over 18 hrs:  50 ml/hr x 1 hr, then 91 ml/hr x 16 hrs, then 50 ml/hr x 1 hr - Lipids 13 ml/hr x 18 hrs - TPN provides approximately 1588 kCal and 78 gm of protein per day, meeting 100% of patient's needs. - Daily multivitamin and trace  elements in TPN - Continue moderate SSI Q4H + 40 units of regular insulin in TPN - Pepcid '40mg'$  in TPN - Continue to add 661m NaCL to make Clinimix 1/2NS concentration (sodium content 77 mEq/L) - Mg 3g IV x 1 - F/U volume status and CBGs.  Consider another trial of cyclic TPN over 16 hours. - KCL x 3 runs.  . - F/U AM labs   HiJaquita Folds/21/2017,9:06 AM

## 2016-06-07 NOTE — Progress Notes (Signed)
PROGRESS NOTE    [name] [name]  [id number] DOB: 12-31-1934 DOA: 04/26/2016 PCP: [name], MD   Brief Narrative:  81 y.o.WF PMHx degenerative disc disease, DM Type 2 controlled with complications, Diverticulosis, GERD, Hiatal Hernia, HLD, HTN, and Chronic Hyponatremia   who was admitted 04/14/2016 > 04/27/2016 for treatment of acute GI bleed with gastric outlet obstruction due to necrotic esophagitis with superficial fungal infection and ulceration with subsequent development of possible right-sided pneumonitis and aspiration pneumonia. Patient was discharged on Unasyn and Diflucan and completed her course on 05/14/2016.  A Left arm PICC was placed for home Unasyn and TPN. She returned to the ED c/o shortness of breath and fevers. In the ED she was noted to be confused.     Subjective: 9/21 A/O  2 (does not know when, why), Follows commands. NAD   Assessment & Plan:   Principal Problem:   Fungemia Active Problems:   DM2 (diabetes mellitus, type 2) ([place])   Gastric outlet obstruction   Esophageal necrosis   Sepsis ([place])   Acute respiratory failure ([place])   Acute encephalopathy   On total parenteral nutrition (TPN)   Normocytic anemia   Essential hypertension   Erosive esophagitis   Candida glabrata infection   Gastric mass   Gastric adenocarcinoma (HCC)   Escherichia coli infection   Bacteremia due to Klebsiella pneumoniae   Acute respiratory failure with hypoxia (HCC)   Disorientation   Hyponatremia   Controlled type 2 diabetes mellitus without complication (HCC)   Sepsis unspecified organism /Fungemia (positive Candida glabrata) -febrile at nursing home to 104.5  - Completed 14 day course of Anidulafungin per ID -remains on TPN, pre-albumin 6  positive Escherichia coli UTI/ Klebsiella+ Enterobacteriaceae species Bacteremia -Last 24 hours afebrile  -continue empiric IV Ceftriaxone. Sensitive to ceftriaxone -Switch to PO Ceftin   Acute hypoxic  respiratory failure / AMS -Resolved  Acute GI bleed / Esophageal necrosis / Gastric outlet obstruction with bowel ulceration -EGD: Gastric tumor, esophagitis E results below -8/31 biopsy: See results below   Gastric tumor Stomach/pyloric channel mass- SUSPICIOUS FOR ADENOCARCINOMA -8/31 biopsy tumor pending -CCS recommendations: Stated most likely will not perform resection until next week after nutritional status improved. -After speaking with [facility] today another EGD would not change plan. Patient needs resection of affected area. -Continue TPN. NG tube NOT PLACED Postpyloric by IR  -Start trickle feeds through NG tube if patient tolerates advance slowly. If patient does not tolerate on 9/8 Reconsult GI can advance the NG tube postpyloric via EGD?  -9/21 Spoke with PA Hilbert Odor [facility] and currently no whole for surgical intervention. Unless nutritional status significantly improves. Will continue to follow along  Chronic Hyponatremia -Appears stable   Recent Labs Lab 06/01/16 0451 06/03/16 0430 06/04/16 0500 06/06/16 0520 06/07/16 0500  NA 135 139 141 141 141   Hypokalemia -Resolved  Hypomagnesemia -Magnesium IV 3 gm  Normocytic anemia(Baseline Hg ~9.0)  Recent Labs Lab 06/01/16 0451 06/03/16 0430 06/04/16 1250 06/06/16 0520  HGB 8.6* 8.7* 8.5* 8.9*   HTN -Hydralazine PRN -Echocardiogram: Essentially normal see results below   DM type 2 controlled without complication -[id number] Hemoglobin A1c = 6.6 -Moderate SSI     DVT prophylaxis: SCD Code Status: DO NOT RESUSCITATE Family Communication: None Disposition Plan: Await CCS recommendation   Consultants:  Dr.Kavitha V [facility] GI Dr. Ralene Ok [facility]    Procedures/Significant Events:  8/31 EGD:- Severe reflux esophagitis. Biopsied.- Gastric tumor at the pylorus and in the prepyloric region  of the stomach.  Biopsied.- Gastric outlet obstruction 8/31 Biopsy Stomach/pyloric channel mass-  SUSPICIOUS FOR ADENOCARCINOMA. - Esophagus, biopsy:- EXUDATE WITH FOCAL NECROSIS C/W ULCER. Negative malignancy 9/1 Echocardiogram:- Left ventricle: mild focal basal hypertrophy of the septum. -LVEF =60% to 65%. 9/5 placement postpyloric NG tube                         Cultures 8/29 blood left hand /right AC positive Candida glabrata 8/29 urine negative 8/29 MRSA by PCR negative 8/31 blood left/right hand negative Final 9/18 blood positive Klebsiella pneumoniae/Enterobacteriaceae species/Candida glabrata 9/18 blood left arm 2 NGTD 9/19 urine positive Escherichia coli   Antimicrobials: Anidulafungin 8/31>>9/13 Ceftriaxone 9/19>>   Devices    LINES / TUBES:  PICC line 9/7>>    Continuous Infusions: . sodium chloride 10 mL/hr at 06/07/16 1000  . Marland KitchenTPN (CLINIMIX-E) Adult 91 mL/hr at 06/06/16 1922   And  . fat emulsion 234 mL (06/06/16 1759)  . Marland KitchenTPN (CLINIMIX-E) Adult     And  . fat emulsion       Objective: Vitals:   06/06/16 1836 06/06/16 2201 06/07/16 0719 06/07/16 0725  BP:  (!) 130/51  (!) 137/56  Pulse: 89 81  84  Resp: 16 20  18   Temp:  97.9 F (36.6 C)  98 F (36.7 C)  TempSrc:  Oral    SpO2: 93% 95%  94%  Weight:   76.6 kg (168 lb 12.8 oz)   Height:        Intake/Output Summary (Last 24 hours) at 06/07/16 1151 Last data filed at 06/07/16 1000  [place] per 24 hour  Intake          1239.24 ml  Output              700 ml  Net           539.24 ml   Filed Weights   06/05/16 0431 06/06/16 0500 06/07/16 0719  Weight: 78.2 kg (172 lb 4.8 oz) 74.1 kg (163 lb 6.4 oz) 76.6 kg (168 lb 12.8 oz)    Examination:  General: A/O  2 (does not know when, why), Follows commands, No acute respiratory distress Eyes: negative scleral hemorrhage, negative anisocoria, negative icterus ENT: Negative Runny nose, negative gingival bleeding, Neck:  Negative scars, masses, torticollis, lymphadenopathy, JVD Lungs: Clear to auscultation bilaterally without wheezes or  crackles Cardiovascular: Regular rate and rhythm without murmur gallop or rub normal S1 and S2 Abdomen: negative abdominal pain, nondistended, positive soft, bowel sounds, no rebound, no ascites, no appreciable mass Extremities: No significant cyanosis, clubbing, or edema bilateral lower extremities Skin: Negative rashes, lesions, ulcers Psychiatric:  Negative depression, negative anxiety, negative fatigue, negative mania  Central nervous system:  Cranial nerves II through XII intact, tongue/uvula midline, all extremities muscle strength 5/5, sensation intact throughout,negative dysarthria, negative expressive aphasia, negative receptive aphasia.  .     Data Reviewed: Care during the described time interval was provided by me .  I have reviewed this patient's available data, including medical history, events of note, physical examination, and all test results as part of my evaluation. I have personally reviewed and interpreted all radiology studies.  CBC:  Recent Labs Lab 06/01/16 0451 06/03/16 0430 06/04/16 1250 06/06/16 0520  WBC 3.5* 8.8 8.1 7.4  NEUTROABS 2.1  --  6.2  --   HGB 8.6* 8.7* 8.5* 8.9*  HCT 27.7* 27.1* 27.5* 28.5*  MCV 83.2 82.1 83.1 82.4  PLT 144* 95*  73* [id number]*   Basic Metabolic Panel:  Recent Labs Lab 06/01/16 0451 06/02/16 0532 06/03/16 0430 06/03/16 1106 06/04/16 0500 06/06/16 0520 06/07/16 0500  NA 135  --  139  --  141 141 141  K 3.7  --  3.0*  --  3.7 2.8* 3.5  CL 105  --  104  --  106 103 104  CO2 26  --  28  --  25 29 31   GLUCOSE 165*  --  177*  --  286* 124* 133*  BUN 24*  --  27*  --  28* 31* 33*  CREATININE 0.63  --  0.72  --  0.83 0.85 0.78  CALCIUM 7.9*  --  8.0*  --  8.4* 8.0* 7.9*  MG  --  1.5*  --  1.5* 1.7  --  1.6*  PHOS  --   --   --  2.5 2.9  --  3.9   GFR: Estimated Creatinine Clearance: 55.5 mL/min (by C-G formula based on SCr of 0.78 mg/dL). Liver Function Tests:  Recent Labs Lab 06/01/16 0451 06/04/16 0500  06/07/16 0500  AST 44* 58* 51*  ALT 35 53 36  ALKPHOS 133* 212* 146*  BILITOT 0.4 0.4 0.4  PROT 4.3* 5.9* 5.0*  ALBUMIN 1.6* 2.0* 1.7*   No results for input(s): LIPASE, AMYLASE in the last 168 hours. No results for input(s): AMMONIA in the last 168 hours. Coagulation Profile: No results for input(s): INR, PROTIME in the last 168 hours. Cardiac Enzymes: No results for input(s): CKTOTAL, CKMB, CKMBINDEX, TROPONINI in the last 168 hours. BNP (last 3 results) No results for input(s): PROBNP in the last 8760 hours. HbA1C: No results for input(s): HGBA1C in the last 72 hours. CBG:  Recent Labs Lab 06/06/16 1620 06/06/16 2013 06/06/16 2356 06/07/16 0404 06/07/16 0811  GLUCAP 110* 156* 163* 127* 117*   Lipid Profile: No results for input(s): CHOL, HDL, LDLCALC, TRIG, CHOLHDL, LDLDIRECT in the last 72 hours. Thyroid Function Tests: No results for input(s): TSH, T4TOTAL, FREET4, T3FREE, THYROIDAB in the last 72 hours. Anemia Panel: No results for input(s): VITAMINB12, FOLATE, FERRITIN, TIBC, IRON, RETICCTPCT in the last 72 hours. Urine analysis:    Component Value Date/Time   COLORURINE YELLOW 06/04/2016 1200   APPEARANCEUR CLOUDY (A) 06/04/2016 1200   LABSPEC 1.023 06/04/2016 1200   PHURINE 6.0 06/04/2016 1200   GLUCOSEU NEGATIVE 06/04/2016 1200   HGBUR NEGATIVE 06/04/2016 1200   BILIRUBINUR SMALL (A) 06/04/2016 1200   KETONESUR NEGATIVE 06/04/2016 1200   PROTEINUR 100 (A) 06/04/2016 1200   NITRITE POSITIVE (A) 06/04/2016 1200   LEUKOCYTESUR MODERATE (A) 06/04/2016 1200   Sepsis Labs: @LABRCNTIP (procalcitonin:4,lacticidven:4)  ) Recent Results (from the past 240 hour(s))  Culture, blood (routine x 2)     Status: Abnormal   Collection Time: 06/04/16 10:10 AM  Result Value Ref Range Status   Specimen Description BLOOD SOURCE UNKNOWN  Final   Special Requests BOTTLES DRAWN AEROBIC AND ANAEROBIC 5CC  Final   Culture  Setup Time   Final    GRAM NEGATIVE RODS IN BOTH  AEROBIC AND ANAEROBIC BOTTLES CRITICAL RESULT CALLED TO, READ BACK BY AND VERIFIED WITH: CARON AMEND,PHARMD @0325  06/05/16 MKELLY    Culture KLEBSIELLA PNEUMONIAE (A)  Final   Report Status 06/07/2016 FINAL  Final   Organism ID, Bacteria KLEBSIELLA PNEUMONIAE  Final      Susceptibility   Klebsiella pneumoniae - MIC*    AMPICILLIN >=32 RESISTANT Resistant     CEFAZOLIN <=4  SENSITIVE Sensitive     CEFEPIME <=1 SENSITIVE Sensitive     CEFTAZIDIME <=1 SENSITIVE Sensitive     CEFTRIAXONE <=1 SENSITIVE Sensitive     CIPROFLOXACIN <=0.25 SENSITIVE Sensitive     GENTAMICIN <=1 SENSITIVE Sensitive     IMIPENEM <=0.25 SENSITIVE Sensitive     TRIMETH/SULFA <=20 SENSITIVE Sensitive     AMPICILLIN/SULBACTAM 16 INTERMEDIATE Intermediate     PIP/TAZO <=4 SENSITIVE Sensitive     Extended ESBL NEGATIVE Sensitive     * KLEBSIELLA PNEUMONIAE  Blood Culture ID Panel (Reflexed)     Status: Abnormal   Collection Time: 06/04/16 10:10 AM  Result Value Ref Range Status   Enterococcus species NOT DETECTED NOT DETECTED Final   Listeria monocytogenes NOT DETECTED NOT DETECTED Final   Staphylococcus species NOT DETECTED NOT DETECTED Final   Staphylococcus aureus NOT DETECTED NOT DETECTED Final   Streptococcus species NOT DETECTED NOT DETECTED Final   Streptococcus agalactiae NOT DETECTED NOT DETECTED Final   Streptococcus pneumoniae NOT DETECTED NOT DETECTED Final   Streptococcus pyogenes NOT DETECTED NOT DETECTED Final   Acinetobacter baumannii NOT DETECTED NOT DETECTED Final   Enterobacteriaceae species DETECTED (A) NOT DETECTED Final    Comment: CRITICAL RESULT CALLED TO, READ BACK BY AND VERIFIED WITH: CARON AMEND,PHARMD @0325  06/05/16 MKELLY    Enterobacter cloacae complex NOT DETECTED NOT DETECTED Final   Escherichia coli NOT DETECTED NOT DETECTED Final   Klebsiella oxytoca NOT DETECTED NOT DETECTED Final   Klebsiella pneumoniae DETECTED (A) NOT DETECTED Final    Comment: CRITICAL RESULT CALLED TO,  READ BACK BY AND VERIFIED WITH: CARON AMEND,PHARMD @0325  06/05/16 MKELLY    Proteus species NOT DETECTED NOT DETECTED Final   Serratia marcescens NOT DETECTED NOT DETECTED Final   Carbapenem resistance NOT DETECTED NOT DETECTED Final   Haemophilus influenzae NOT DETECTED NOT DETECTED Final   Neisseria meningitidis NOT DETECTED NOT DETECTED Final   Pseudomonas aeruginosa NOT DETECTED NOT DETECTED Final   Candida albicans NOT DETECTED NOT DETECTED Final   Candida glabrata NOT DETECTED NOT DETECTED Final   Candida krusei NOT DETECTED NOT DETECTED Final   Candida parapsilosis NOT DETECTED NOT DETECTED Final   Candida tropicalis NOT DETECTED NOT DETECTED Final  Culture, blood (routine x 2)     Status: None (Preliminary result)   Collection Time: 06/04/16 11:05 AM  Result Value Ref Range Status   Specimen Description BLOOD LEFT ARM  Final   Special Requests BOTTLES DRAWN AEROBIC ONLY 2CC  Final   Culture NO GROWTH 2 DAYS  Final   Report Status PENDING  Incomplete  Culture, blood (routine x 2)     Status: None (Preliminary result)   Collection Time: 06/04/16 11:13 AM  Result Value Ref Range Status   Specimen Description BLOOD LEFT ARM  Final   Special Requests BOTTLES DRAWN AEROBIC ONLY 2CC  Final   Culture NO GROWTH 2 DAYS  Final   Report Status PENDING  Incomplete  Culture, Urine     Status: Abnormal   Collection Time: 06/05/16  2:09 PM  Result Value Ref Range Status   Specimen Description URINE, RANDOM  Final   Special Requests NONE  Final   Culture >=100,000 COLONIES/mL ESCHERICHIA COLI (A)  Final   Report Status 06/07/2016 FINAL  Final   Organism ID, Bacteria ESCHERICHIA COLI (A)  Final      Susceptibility   Escherichia coli - MIC*    AMPICILLIN 8 SENSITIVE Sensitive     CEFAZOLIN <=4 SENSITIVE Sensitive  CEFTRIAXONE <=1 SENSITIVE Sensitive     CIPROFLOXACIN <=0.25 SENSITIVE Sensitive     GENTAMICIN <=1 SENSITIVE Sensitive     IMIPENEM <=0.25 SENSITIVE Sensitive      NITROFURANTOIN <=16 SENSITIVE Sensitive     TRIMETH/SULFA <=20 SENSITIVE Sensitive     AMPICILLIN/SULBACTAM 4 SENSITIVE Sensitive     PIP/TAZO <=4 SENSITIVE Sensitive     Extended ESBL NEGATIVE Sensitive     * >=100,000 COLONIES/mL ESCHERICHIA COLI         Radiology Studies: No results found.      Scheduled Meds: . antiseptic oral rinse  7 mL Mouth Rinse q12n4p  . cefTRIAXone (ROCEPHIN)  IV  2 g Intravenous Q24H  . chlorhexidine  15 mL Mouth Rinse BID  . insulin aspart  0-15 Units Subcutaneous Q4H  . magnesium sulfate 1 - 4 g bolus IVPB  3 g Intravenous Once  . magnesium sulfate 1 - 4 g bolus IVPB  2 g Intravenous Once  . phenol  1 spray Mouth/Throat BID  . potassium chloride  10 mEq Intravenous Q1 Hr x 3  . sodium chloride flush  10-40 mL Intracatheter Q12H  . sodium chloride flush  3 mL Intravenous Q12H   Continuous Infusions: . sodium chloride 10 mL/hr at 06/07/16 1000  . Marland KitchenTPN (CLINIMIX-E) Adult 91 mL/hr at 06/06/16 1922   And  . fat emulsion 234 mL (06/06/16 1759)  . Marland KitchenTPN (CLINIMIX-E) Adult     And  . fat emulsion       LOS: 23 days    Time spent: 40 minutes    Karysa Heft, Geraldo Docker, MD [facility] Pager [phone]   If 7PM-7AM, please contact night-coverage [email or web address] Password [facility] 06/07/2016, 11:51 AM

## 2016-06-08 DIAGNOSIS — Z515 Encounter for palliative care: Secondary | ICD-10-CM

## 2016-06-08 LAB — BASIC METABOLIC PANEL
ANION GAP: 5 (ref 5–15)
BUN: 30 mg/dL — ABNORMAL HIGH (ref 6–20)
CHLORIDE: 101 mmol/L (ref 101–111)
CO2: 32 mmol/L (ref 22–32)
Calcium: 8 mg/dL — ABNORMAL LOW (ref 8.9–10.3)
Creatinine, Ser: 0.68 mg/dL (ref 0.44–1.00)
GFR calc non Af Amer: >60 mL/min (ref >60)
Glucose, Bld: 151 mg/dL — ABNORMAL HIGH (ref 65–99)
Potassium: 3.8 mmol/L (ref 3.5–5.1)
Sodium: 138 mmol/L (ref 135–145)

## 2016-06-08 LAB — GLUCOSE, CAPILLARY
GLUCOSE-CAPILLARY: 133 mg/dL — AB (ref 65–99)
GLUCOSE-CAPILLARY: 153 mg/dL — AB (ref 65–99)
GLUCOSE-CAPILLARY: 189 mg/dL — AB (ref 65–99)
Glucose-Capillary: 131 mg/dL — ABNORMAL HIGH (ref 65–99)
Glucose-Capillary: 81 mg/dL (ref 65–99)
Glucose-Capillary: 159 mg/dL — ABNORMAL HIGH (ref 65–99)

## 2016-06-08 LAB — MAGNESIUM: Magnesium: 2.4 mg/dL (ref 1.7–2.4)

## 2016-06-08 MED ORDER — TRACE MINERALS CR-CU-MN-SE-ZN 10-1000-500-60 MCG/ML IV SOLN
INTRAVENOUS | Status: AC
  Administered 2016-06-08: 18:00:00 via INTRAVENOUS
  Filled 2016-06-08: qty 1556

## 2016-06-08 MED ORDER — FAT EMULSION 20 % IV EMUL
234.0000 mL | INTRAVENOUS | Status: AC
  Administered 2016-06-08: 234 mL via INTRAVENOUS
  Filled 2016-06-08 (×2): qty 250

## 2016-06-08 MED ORDER — PROMETHAZINE HCL 25 MG/ML IJ SOLN
12.5000 mg | Freq: Once | INTRAMUSCULAR | Status: AC
  Administered 2016-06-08: 12.5 mg via INTRAVENOUS
  Filled 2016-06-08: qty 1

## 2016-06-08 NOTE — Progress Notes (Signed)
PARENTERAL NUTRITION CONSULT NOTE - FOLLOW UP  Pharmacy Consult for TPN Indication: Gastric outlet obstruction  Allergies  Allergen Reactions  . Ace Inhibitors Cough  . Prednisone Other (See Comments)    LETHARGY  . Sitagliptin Other (See Comments)    Noted on [facility]    Patient Measurements: Height: _0  (157.5 cm) Weight: 175 lb 4.3 oz (79.5 kg) IBW/kg (Calculated) : 50.1 Adjusted Body Weight: 75 Usual Weight: 80  Vital Signs: Temp: 98.2 F (36.8 C) (09/22 0520) Temp Source: Oral (09/22 0520) BP: 152/58 (09/22 0520) Pulse Rate: 96 (09/22 0533) Intake/Output from previous day: 09/21 0701 - 09/22 0700 In: 7026 [I.V.:1140; IV Piggyback:50] Out: 600 [Urine:600] Intake/Output from this shift: No intake/output data recorded.  Labs:  Recent Labs  06/06/16 0520  WBC 7.4  HGB 8.9*  HCT 28.5*  PLT 111*     Recent Labs  06/06/16 0520 06/07/16 0500 06/08/16 0415  NA 141 141 138  K 2.8* 3.5 3.8  CL 103 104 101  CO2 29 31 32  GLUCOSE 124* 133* 151*  BUN 31* 33* 30*  CREATININE 0.85 0.78 0.68  CALCIUM 8.0* 7.9* 8.0*  MG  --  1.6* 2.4  PHOS  --  3.9  --   PROT  --  5.0*  --   ALBUMIN  --  1.7*  --   AST  --  51*  --   ALT  --  36  --   ALKPHOS  --  146*  --   BILITOT  --  0.4  --    Estimated Creatinine Clearance: 56.6 mL/min (by C-G formula based on SCr of 0.68 mg/dL).    Recent Labs  06/07/16 2042 06/08/16 0001 06/08/16 0411  GLUCAP 146* 189* 153*    Medications:  Scheduled:  . antiseptic oral rinse  7 mL Mouth Rinse q12n4p  . cefTRIAXone (ROCEPHIN)  IV  2 g Intravenous Q24H  . chlorhexidine  15 mL Mouth Rinse BID  . insulin aspart  0-15 Units Subcutaneous Q4H  . phenol  1 spray Mouth/Throat BID  . sodium chloride flush  10-40 mL Intracatheter Q12H  . sodium chloride flush  3 mL Intravenous Q12H    Insulin Requirements in the past 24 hours:  12units moderate SSI + 40 units regular insulin in TPN  Assessment: 34 YOF recently admitted to  [facility] with gastric outlet obstruction due to necrotic esophagitis and discharged on home Unasyn and TPN to [facility]. Admitted to [facility] 05/12/2016 with shortness of breath, fever, and confusion. PICC line was removed on admission due to concern for line infection - found to have fungemia. She is to continue nutrition support with TPN.  GI: Pepcid in TPN. IR unable to place postpyloric tube d/t obstruction. Prealbumin 3.5 >6.1, slow rise d/t inflammation. Bx suspicious for adenocarcinoma with plan for resection and pyloroplasty vs GJ once nutritional status improves, but may pursue palliative approach Endo: hx DM - CBGs currently controlled, have been quite variable and required frequent insulin adjustment in TPN(insulin in TPN range: 20-50 units) Lytes: hyponatremia prior to TPN initiation. Na added to TPN 9/10 per [facility] request - Na 141 x 4 days with NaCL added to TPN, down to 138 this AM.  K+ 3.8- s/p K runs x 3, Mg 2.4- s/p Mg 5g total, others WNL Renal: SCr stable, BUN 31, net+16L since admit.  UOP 0.31m/kg/hr Cards: VSS- IV Lasix BID d/t hypervolemia- stopped 9/20. Hepatobil: alk phos elevated, AST mildly elevated, tbili / TG  WNL. Expect LFTs to rise given no gut stimulation. Pulm: back on [place]-1L, sats 90-100s ID: [id number] Kleb bacteremia. S/p 14d [name] for C.glabrata bacteremia - now afebrile, WBC trending down (9/20)  Best Practices: SCDs TPN Access: PICC replaced 05/24/16 TPN start date: on TPN PTA (since 04/19/16 per Surgery)  [facility] E 5/15 1440 ml + 20% lipids 192 ml 35 units of regular insulin ordered for each bag - per discussion there is question if this was actually being added to each bag at the facility  Current Nutrition: TPN  Nutritional Goals: 1500-1700 kCal and 70-80 grams of protein per day  Plan: - Continue cyclic Clinimix E 56/00 infuse  15579mover 18 hrs: 50 ml/hr x 1 hr, then 91 ml/hr x 16 hrs, then 50 ml/hr x 1 hr - Lipids 13 ml/hr x 18 hrs - TPN provides approximately 1588 kCal and 78 gm of protein per day, meeting 100% of patient's needs. - Daily multivitamin and trace elementsin TPN - Continue moderateSSI Q4H + 40 units of regular insulin in TPN - Pepcid 4035mn TPN - Continue to add Na 58m50maCL to make Clinimix 1/2NS concentration (sodium content 77 mEq/L) - F/U volume status and CBGs. Consider another trial of cyclicTPN over 16 hours. - BMet in AM - TPN labs qMon/Thurs    KendGracy BruinsarmD Clinical Pharmacist [facility]

## 2016-06-08 NOTE — Progress Notes (Signed)
[name] [name]  [id number] DOB: July 22, 1935 DOA: 05/06/2016 PCP: [name], MD   Brief Narrative:  81 y.o.WF PMHx degenerative disc disease, DM Type 2 controlled with complications, Diverticulosis, GERD, Hiatal Hernia, HLD, HTN, and Chronic Hyponatremia   who was admitted 04/14/2016 > 04/27/2016 for treatment of acute GI bleed with gastric outlet obstruction due to necrotic esophagitis with superficial fungal infection and ulceration with subsequent development of possible right-sided pneumonitis and aspiration pneumonia. Patient was discharged on Unasyn and Diflucan and completed her course on 05/14/2016.  A Left arm PICC was placed for home Unasyn and TPN. She returned to the ED c/o shortness of breath and fevers. In the ED she was noted to be confused.     Subjective: She has no new complaints reported to me today. She would like to know what the plan is. I have answered her questions to her satisfaction.    Assessment & Plan:   Principal Problem:   Fungemia Active Problems:   DM2 (diabetes mellitus, type 2) ([place])   Gastric outlet obstruction   Esophageal necrosis   Sepsis ([place])   Acute respiratory failure ([place])   Acute encephalopathy   On total parenteral nutrition (TPN)   Normocytic anemia   Essential hypertension   Erosive esophagitis   Candida glabrata infection   Gastric mass   Gastric adenocarcinoma (HCC)   Escherichia coli infection   Bacteremia due to Klebsiella pneumoniae   Acute respiratory failure with hypoxia (HCC)   Disorientation   Hyponatremia   Controlled type 2 diabetes mellitus without complication ([place])   Palliative care encounter   Sepsis unspecified organism /Fungemia (positive Candida glabrata) -febrile at nursing home to 104.5  - Completed 14 day course of Anidulafungin per ID -remains on TPN, pre-albumin 6  positive Escherichia coli UTI/ Klebsiella+ Enterobacteriaceae species Bacteremia -Last 24 hours afebrile    -continue empiric IV Ceftriaxone. Sensitive to ceftriaxone -Switch to PO Ceftin for 7 day total treatment..  Acute hypoxic respiratory failure / AMS -Resolved  Acute GI bleed / Esophageal necrosis / Gastric outlet obstruction with bowel ulceration -EGD: Gastric tumor, esophagitis E results below -8/31 biopsy: See results below   Gastric tumor Stomach/pyloric channel mass- SUSPICIOUS FOR ADENOCARCINOMA -8/31 biopsy tumor pending -CCS recommendations: Stated most likely will not perform resection until next week after nutritional status improved. -After speaking with [facility] today another EGD would not change plan. Patient needs resection of affected area. -Continue TPN. NG tube NOT PLACED Postpyloric by IR  -9/21 Spoke with PA Hilbert Odor C[facility] and currently not candidate for surgical intervention. Unless nutritional status significantly improves. Will continue to follow along  Chronic Hyponatremia -Appears stable   Recent Labs Lab 06/03/16 0430 06/04/16 0500 06/06/16 0520 06/07/16 0500 06/08/16 0415  NA 139 141 141 141 138   Hypokalemia -Resolved  Hypomagnesemia -Magnesium IV 3 gm  Normocytic anemia(Baseline Hg ~9.0)  Recent Labs Lab 06/03/16 0430 06/04/16 1250 06/06/16 0520  HGB 8.7* 8.5* 8.9*   HTN -Hydralazine PRN -Echocardiogram: Essentially normal see results below   DM type 2 controlled without complication -[id number] Hemoglobin A1c = 6.6 -Moderate SSI   DVT prophylaxis: SCD Code Status: DO NOT RESUSCITATE Family Communication: None Disposition Plan: Awaiting discussion with palliative care team.    Consultants:  Dr.Kavitha V Nand[place] GI Dr. Ralene Ok [facility]  Procedures/Significant Events:  8/31 EGD:- Severe reflux esophagitis. Biopsied.- Gastric tumor at the pylorus and in the prepyloric region of the stomach.  Biopsied.- Gastric outlet  obstruction 8/31 Biopsy Stomach/pyloric channel mass- SUSPICIOUS FOR ADENOCARCINOMA. - Esophagus,  biopsy:- EXUDATE WITH FOCAL NECROSIS C/W ULCER. Negative malignancy 9/1 Echocardiogram:- Left ventricle: mild focal basal hypertrophy of the septum. -LVEF =60% to 65%. 9/5 placement postpyloric NG tube                         Cultures 8/29 blood left hand /right AC positive Candida glabrata 8/29 urine negative 8/29 MRSA by PCR negative 8/31 blood left/right hand negative Final 9/18 blood positive Klebsiella pneumoniae/Enterobacteriaceae species/Candida glabrata 9/18 blood left arm 2 NGTD 9/19 urine positive Escherichia coli   Antimicrobials: Anidulafungin 8/31>>9/13 Ceftriaxone 9/19>>   Devices    LINES / TUBES:  PICC line 9/7>>    Continuous Infusions: . sodium chloride 10 mL/hr at 06/07/16 1000  . [place] KitchenTPN (CLINIMIX-E) Adult Stopped (06/08/16 1350)   And  . fat emulsion Stopped (06/08/16 1350)  . Mar[place] KitchenTPN (CLINIMIX-E) Adult     And  . fat emulsion       Objective: Vitals:   06/08/16 0520 06/08/16 0533 06/08/16 1110 06/08/16 1415  BP: (!) 152/58  (!) 136/56 (!) 146/55  Pulse: 98 96 94 90  Resp: 18  17 18   Temp: 98.2 F (36.8 C)  98 F (36.7 C) 97.4 F (36.3 C)  TempSrc: Oral  Oral Oral  SpO2: (!) 88% 95% 92% 95%  Weight: 79.5 kg (175 lb 4.3 oz)     Height:        Intake/Output Summary (Last 24 hours) at 06/08/16 1458 Last data filed at 06/08/16 1416  Gross per 24 hour  Intake             1140 ml  Output              600 ml  Net              540 ml   Filed Weights   06/06/16 0500 06/07/16 0719 06/08/16 0520  Weight: 74.1 kg (163 lb 6.4 oz) 76.6 kg (168 lb 12.8 oz) 79.5 kg (175 lb 4.3 oz)    Examination:  General: Alert and awake, Follows commands, No acute respiratory distress Eyes: negative scleral hemorrhage, negative anisocoria, negative icterus ENT: Negative Runny nose, negative gingival bleeding, Neck:  Negative scars, masses, torticollis, lymphadenopathy, JVD Lungs: Clear to auscultation bilaterally without wheezes or crackles Cardiovascular:  Regular rate and rhythm without murmur gallop or rub normal S1 and S2 Abdomen: negative abdominal pain, nondistended, positive soft, bowel sounds, no rebound, no ascites, no appreciable mass Extremities: No significant cyanosis, clubbing, or edema bilateral lower extremities Skin: Negative rashes, lesions, ulcers Psychiatric:  Negative depression, negative anxiety, negative fatigue, negative mania  Central nervous system:  No facial asymmetry, tongue/uvula midline, all extremities muscle strength 5/5, sensation intact throughout,negative dysarthria, negative expressive aphasia, negative receptive aphasia.   CBC:  Recent Labs Lab 06/03/16 0430 06/04/16 1250 06/06/16 0520  WBC 8.8 8.1 7.4  NEUTROABS  --  6.2  --   HGB 8.7* 8.5* 8.9*  HCT 27.1* 27.5* 28.5*  MCV 82.1 83.1 82.4  PLT 95* 73* [id number]*   Basic Metabolic Panel:  Recent Labs Lab 06/02/16 0532 06/03/16 0430 06/03/16 1106 06/04/16 0500 06/06/16 0520 06/07/16 0500 06/08/16 0415  NA  --  139  --  141 141 141 138  K  --  3.0*  --  3.7 2.8* 3.5 3.8  CL  --  104  --  106 103 104 101  CO2  --  28  --  25 29 31  32  GLUCOSE  --  177*  --  286* 124* 133* 151*  BUN  --  27*  --  28* 31* 33* 30*  CREATININE  --  0.72  --  0.83 0.85 0.78 0.68  CALCIUM  --  8.0*  --  8.4* 8.0* 7.9* 8.0*  MG 1.5*  --  1.5* 1.7  --  1.6* 2.4  PHOS  --   --  2.5 2.9  --  3.9  --    GFR: Estimated Creatinine Clearance: 56.6 mL/min (by C-G formula based on SCr of 0.68 mg/dL). Liver Function Tests:  Recent Labs Lab 06/04/16 0500 06/07/16 0500  AST 58* 51*  ALT 53 36  ALKPHOS 212* 146*  BILITOT 0.4 0.4  PROT 5.9* 5.0*  ALBUMIN 2.0* 1.7*   No results for input(s): LIPASE, AMYLASE in the last 168 hours. No results for input(s): AMMONIA in the last 168 hours. Coagulation Profile: No results for input(s): INR, PROTIME in the last 168 hours. Cardiac Enzymes: No results for input(s): CKTOTAL, CKMB, CKMBINDEX, TROPONINI in the last 168  hours. BNP (last 3 results) No results for input(s): PROBNP in the last 8760 hours. HbA1C: No results for input(s): HGBA1C in the last 72 hours. CBG:  Recent Labs Lab 06/07/16 2042 06/08/16 0001 06/08/16 0411 06/08/16 0757 06/08/16 1235  GLUCAP 146* 189* 153* 131* 133*   Lipid Profile: No results for input(s): CHOL, HDL, LDLCALC, TRIG, CHOLHDL, LDLDIRECT in the last 72 hours. Thyroid Function Tests: No results for input(s): TSH, T4TOTAL, FREET4, T3FREE, THYROIDAB in the last 72 hours. Anemia Panel: No results for input(s): VITAMINB12, FOLATE, FERRITIN, TIBC, IRON, RETICCTPCT in the last 72 hours. Urine analysis:    Component Value Date/Time   COLORURINE YELLOW 06/04/2016 1200   APPEARANCEUR CLOUDY (A) 06/04/2016 1200   LABSPEC 1.023 06/04/2016 1200   PHURINE 6.0 06/04/2016 1200   GLUCOSEU NEGATIVE 06/04/2016 1200   HGBUR NEGATIVE 06/04/2016 1200   BILIRUBINUR SMALL (A) 06/04/2016 1200   KETONESUR NEGATIVE 06/04/2016 1200   PROTEINUR 100 (A) 06/04/2016 1200   NITRITE POSITIVE (A) 06/04/2016 1200   LEUKOCYTESUR MODERATE (A) 06/04/2016 1200   Sepsis Labs: @LABRCNTIP (procalcitonin:4,lacticidven:4)  ) Recent Results (from the past 240 hour(s))  Culture, blood (routine x 2)     Status: Abnormal   Collection Time: 06/04/16 10:10 AM  Result Value Ref Range Status   Specimen Description BLOOD SOURCE UNKNOWN  Final   Special Requests BOTTLES DRAWN AEROBIC AND ANAEROBIC 5CC  Final   Culture  Setup Time   Final    GRAM NEGATIVE RODS IN BOTH AEROBIC AND ANAEROBIC BOTTLES CRITICAL RESULT CALLED TO, READ BACK BY AND VERIFIED WITH: CARON AMEND,PHARMD @0325  06/05/16 MKELLY    Culture KLEBSIELLA PNEUMONIAE (A)  Final   Report Status 06/07/2016 FINAL  Final   Organism ID, Bacteria KLEBSIELLA PNEUMONIAE  Final      Susceptibility   Klebsiella pneumoniae - MIC*    AMPICILLIN >=32 RESISTANT Resistant     CEFAZOLIN <=4 SENSITIVE Sensitive     CEFEPIME <=1 SENSITIVE Sensitive      CEFTAZIDIME <=1 SENSITIVE Sensitive     CEFTRIAXONE <=1 SENSITIVE Sensitive     CIPROFLOXACIN <=0.25 SENSITIVE Sensitive     GENTAMICIN <=1 SENSITIVE Sensitive     IMIPENEM <=0.25 SENSITIVE Sensitive     TRIMETH/SULFA <=20 SENSITIVE Sensitive     AMPICILLIN/SULBACTAM 16 INTERMEDIATE Intermediate     PIP/TAZO <=4 SENSITIVE Sensitive     Extended  ESBL NEGATIVE Sensitive     * KLEBSIELLA PNEUMONIAE  Blood Culture ID Panel (Reflexed)     Status: Abnormal   Collection Time: 06/04/16 10:10 AM  Result Value Ref Range Status   Enterococcus species NOT DETECTED NOT DETECTED Final   Listeria monocytogenes NOT DETECTED NOT DETECTED Final   Staphylococcus species NOT DETECTED NOT DETECTED Final   Staphylococcus aureus NOT DETECTED NOT DETECTED Final   Streptococcus species NOT DETECTED NOT DETECTED Final   Streptococcus agalactiae NOT DETECTED NOT DETECTED Final   Streptococcus pneumoniae NOT DETECTED NOT DETECTED Final   Streptococcus pyogenes NOT DETECTED NOT DETECTED Final   Acinetobacter baumannii NOT DETECTED NOT DETECTED Final   Enterobacteriaceae species DETECTED (A) NOT DETECTED Final    Comment: CRITICAL RESULT CALLED TO, READ BACK BY AND VERIFIED WITH: CARON AMEND,PHARMD @0325  06/05/16 MKELLY    Enterobacter cloacae complex NOT DETECTED NOT DETECTED Final   Escherichia coli NOT DETECTED NOT DETECTED Final   Klebsiella oxytoca NOT DETECTED NOT DETECTED Final   Klebsiella pneumoniae DETECTED (A) NOT DETECTED Final    Comment: CRITICAL RESULT CALLED TO, READ BACK BY AND VERIFIED WITH: CARON AMEND,PHARMD @0325  06/05/16 MKELLY    Proteus species NOT DETECTED NOT DETECTED Final   Serratia marcescens NOT DETECTED NOT DETECTED Final   Carbapenem resistance NOT DETECTED NOT DETECTED Final   Haemophilus influenzae NOT DETECTED NOT DETECTED Final   Neisseria meningitidis NOT DETECTED NOT DETECTED Final   Pseudomonas aeruginosa NOT DETECTED NOT DETECTED Final   Candida albicans NOT DETECTED  NOT DETECTED Final   Candida glabrata NOT DETECTED NOT DETECTED Final   Candida krusei NOT DETECTED NOT DETECTED Final   Candida parapsilosis NOT DETECTED NOT DETECTED Final   Candida tropicalis NOT DETECTED NOT DETECTED Final  Culture, blood (routine x 2)     Status: None (Preliminary result)   Collection Time: 06/04/16 11:05 AM  Result Value Ref Range Status   Specimen Description BLOOD LEFT ARM  Final   Special Requests BOTTLES DRAWN AEROBIC ONLY 2CC  Final   Culture NO GROWTH 3 DAYS  Final   Report Status PENDING  Incomplete  Culture, blood (routine x 2)     Status: None (Preliminary result)   Collection Time: 06/04/16 11:13 AM  Result Value Ref Range Status   Specimen Description BLOOD LEFT ARM  Final   Special Requests BOTTLES DRAWN AEROBIC ONLY 2CC  Final   Culture NO GROWTH 3 DAYS  Final   Report Status PENDING  Incomplete  Culture, Urine     Status: Abnormal   Collection Time: 06/05/16  2:09 PM  Result Value Ref Range Status   Specimen Description URINE, RANDOM  Final   Special Requests NONE  Final   Culture >=100,000 COLONIES/mL ESCHERICHIA COLI (A)  Final   Report Status 06/07/2016 FINAL  Final   Organism ID, Bacteria ESCHERICHIA COLI (A)  Final      Susceptibility   Escherichia coli - MIC*    AMPICILLIN 8 SENSITIVE Sensitive     CEFAZOLIN <=4 SENSITIVE Sensitive     CEFTRIAXONE <=1 SENSITIVE Sensitive     CIPROFLOXACIN <=0.25 SENSITIVE Sensitive     GENTAMICIN <=1 SENSITIVE Sensitive     IMIPENEM <=0.25 SENSITIVE Sensitive     NITROFURANTOIN <=16 SENSITIVE Sensitive     TRIMETH/SULFA <=20 SENSITIVE Sensitive     AMPICILLIN/SULBACTAM 4 SENSITIVE Sensitive     PIP/TAZO <=4 SENSITIVE Sensitive     Extended ESBL NEGATIVE Sensitive     * >=100,000 COLONIES/mL  ESCHERICHIA COLI         Radiology Studies: No results found.      Scheduled Meds: . antiseptic oral rinse  7 mL Mouth Rinse q12n4p  . cefTRIAXone (ROCEPHIN)  IV  2 g Intravenous Q24H  .  chlorhexidine  15 mL Mouth Rinse BID  . insulin aspart  0-15 Units Subcutaneous Q4H  . phenol  1 spray Mouth/Throat BID  . sodium chloride flush  10-40 mL Intracatheter Q12H  . sodium chloride flush  3 mL Intravenous Q12H   Continuous Infusions: . sodium chloride 10 mL/hr at 06/07/16 1000  . Marland KitchenTPN (CLINIMIX-E) Adult Stopped (06/08/16 1350)   And  . fat emulsion Stopped (06/08/16 1350)  . Marland KitchenTPN (CLINIMIX-E) Adult     And  . fat emulsion       LOS: 24 days    Time spent: 35 minutes   Velvet Bathe, MD [facility] Pager [phone]   If 7PM-7AM, please contact night-coverage [email or web address] Password [facility] 06/08/2016, 2:58 PM

## 2016-06-08 NOTE — Significant Event (Signed)
Rapid Response Event Note  Overview:  Called for change in LOC Time Called: 1602 Arrival Time: 1615 Event Type: Neurologic  Initial Focused Assessment:  Patient pale, warm and dry - snoring respirations - arouses to loud voice and tactile stimulation - speech slurred - follows simple commands - mouth breathing - pupils 3 mm ERL - moves all extremities - RN reports patient had IV Phenergan for vomiting and became suddenly unresponsive.  Initial BP was 93/59 HR 95 per RN - on my arrival BP is 128/60 HR 98 RR 16 - O2 sats 82%.   Interventions:  Placed nasal cannula in mouth - increased O2 flow - repositioned with HOB elevated - then placed on O2 mask.  Stimulate patient - she remains able to follow commands - and moves everything - recognizes family in room - oral care done - has gag - coughs on command - some upper airway coarse BS noted.  Had second episode of vomiting - suction orally - able to cough.  Resps regular and unlabored - able to wean to nasal cannula - more easily aroused - speech little more clear - answers questions appropriately - Bp 138/70 RR 16 HR 96 O2 sats 100% on 3 liters.  Less snoring - opening eyes spont and looking around room - asking family member questions.  Placed on continuous O2 sats until fully awake.   Plan of Care (if not transferred): Cont O2 sats - monitor for increased RR and HR - decreased BP or  Elevated temp - if present consider PCXR.  Stable for now.   Follow Up:  1830 - more alert - VSS - resp status stable.    Event Summary: Name of Physician Notified: Dr. Wendee Beavers at  (pta RRT)    at    Outcome: Stayed in room and stabalized  Event End Time: 1715  Quin Hoop

## 2016-06-08 NOTE — Progress Notes (Signed)
Spoke to pt and dtr via phone. Plan is to meet for [place] discussion 06/09/16 at 10 am. Daughter feels "like she has been left out of the loop" in terms of understanding why there isn't something that can help her mother. She has heard that her mother "isn't strong enough for surgery" but given that her mother was independent up until July she is struggling to understand why there are no interventions if not curative could help her to drink and/or eat. Have placed call to surgery to discuss case and options even if palliative option such as  A venting PEG Romona Curls, ANP-ACHPN

## 2016-06-08 NOTE — Consult Note (Signed)
Consultation Note Date: 06/08/2016   Patient Name: [name] [name]  DOB: 1934-10-26  MRN: [id number]  Age / Sex: 81 y.o., female  PCP: [name], MD Referring Physician: Velvet Bathe, MD  Reason for Consultation: Goals of Care Discussion  HPI/Patient Profile: 81 y.o. female  with past medical history of DDD, T2DM, diverticulosis, severe GERD who was admitted on 04/27/2016 with AMS, shortness of breath, and fevers. She has gastric outlet obstruction from a gastric tumor/pyloric channel mass seen on EGD that was concerning for adenocarcinoma on recent biopsy. Of note, patient was recently hospitalized from 04/14/2016 to 04/27/2016 for acute GI bleed with gastric outlet obstruction, necrotic esophagitis with superficial fungal infection and ulceration, with subsequent development of possible right-sided pneumonitis and aspiration pneumonia. TPN was started during last hospitalization on 04/18/16. Patient has hypoalbuminemia (1.6-2.0); given poor nutritional status, resection of gastric tumor is not a possibility at this time per surgery and attending. Extent of possible cancer unknown and has not been worked up. Problems further complicating hospital stay include fungemia and bacteremia (culture positive for E coli, Klebsiella, Enterobacter).  Clinical Assessment and Goals of Care: Visited patient with NP Romona Curls. Patient reports she had lived independently alone at home until earlier this year when she had a fall. She went to SNF after hospitalization from fall and had been having pain with eating. She has been on TPN since last hospitalization due to gastric outlet obstruction, recurrent vomiting and esophagitis. She does not expect to go home from this hospitalization due to ongoing weakness. Patient reports she was told she has cancer and that there is nothing that can be done about it. She expressed confusion  over what the ongoing plan would be. She said being told she has cancer was very difficult but almost more difficult was being told she would never eat or drink again. She expressed that quality of life was important to her. She noted that being able to eat and drink is part of what good quality of life means to her. She also enjoys visiting with family and would like them to be a part of her decision-making.   Romona Curls, ANP called surgery to explore option of venting PEG/g-tube for palliative purpose and they felt that this maybe a viable option the first of next week.  HCPOA: Daughter [name]    SUMMARY OF RECOMMENDATIONS   Patient would like more information about her diagnosis and possible treatment options. She would like her daughter to be part of her care planning.  Meeting set for 9/23'/17 at [facility] may consider palliative option such as venting PEG/g-tube to allow pt to drink  Code Status/Advance Care Planning:  Full Code  Planning for family meeting with daughter [name] and patient at 10 am 06/09/16.    Symptom Management:   Consider palliative vented g-tube. Discussed with General Surgery 9/22.   Ordered q2h oral care for dry mouth.   Prognosis:   Unable to determine, unclear if patient has extensive cancer burden from only gastric biopsy. Status  on TPN for continued nutrition with continued electrolyte abnormalities does indicate severe disease as well as albumin of 1.7 and functional decline prior to admission as evidenced by recurrent falls  Discharge Planning: To Be Determined      Primary Diagnoses: Present on Admission: . Sepsis ([place]) . Acute respiratory failure ([place]) . Acute encephalopathy . On total parenteral nutrition (TPN) . Normocytic anemia . Essential hypertension . Erosive esophagitis . Gastric outlet obstruction . Fungemia . Candida glabrata infection . Gastric mass . Gastric adenocarcinoma ([place]) . Escherichia coli infection .  Bacteremia due to Klebsiella pneumoniae . Acute respiratory failure with hypoxia ([place]) . Disorientation . Hyponatremia   I have reviewed the medical record, interviewed the patient and family, and examined the patient. The following aspects are pertinent.  Past Medical History:  Diagnosis Date  . DDD (degenerative disc disease), lumbar   . Diabetes mellitus   . Diverticulosis   . Endocervical polyp   . Endometrial polyp   . GERD (gastroesophageal reflux disease)   . Hiatal hernia   . Hx of adenomatous colonic polyps   . Hyperlipidemia   . Hypertension   . Hyponatremia    Social History   Social History  . Marital status: Widowed    Spouse name: N/A  . Number of children: 1  . Years of education: N/A   Occupational History  . retired Retired   Social History Main Topics  . Smoking status: Never Smoker  . Smokeless tobacco: Never Used  . Alcohol use No  . Drug use: No  . Sexual activity: No   Other Topics Concern  . None   Social History Narrative  . None   Family History  Problem Relation Age of Onset  . Hypertension Mother   . Stroke Mother   . Diabetes Sister     x 2  . Lung cancer Brother     mets  . Alcohol abuse Father   . Breast cancer Cousin     Mat. 1st cousin-Age 72   Scheduled Meds: . antiseptic oral rinse  7 mL Mouth Rinse q12n4p  . cefTRIAXone (ROCEPHIN)  IV  2 g Intravenous Q24H  . chlorhexidine  15 mL Mouth Rinse BID  . insulin aspart  0-15 Units Subcutaneous Q4H  . phenol  1 spray Mouth/Throat BID  . sodium chloride flush  10-40 mL Intracatheter Q12H  . sodium chloride flush  3 mL Intravenous Q12H   Continuous Infusions: . sodium chloride 10 mL/hr at 06/08/16 1535  . Marland KitchenTPN (CLINIMIX-E) Adult Stopped (06/08/16 1350)   And  . fat emulsion Stopped (06/08/16 1350)  . Marland KitchenTPN (CLINIMIX-E) Adult     And  . fat emulsion     PRN Meds:.acetaminophen, hydrALAZINE, ipratropium-albuterol, menthol-cetylpyridinium, ondansetron, phenol, sodium  chloride flush, sodium chloride flush Medications Prior to Admission:  Prior to Admission medications   Medication Sig Start Date End Date Taking? Authorizing Provider  acetaminophen (TYLENOL) 325 MG tablet Take 325 mg by mouth every 6 (six) hours as needed for moderate pain or headache.    Yes Historical Provider, MD  ADULT TPN Inject into the vein See admin instructions. With lipids to run at 70 ml's/hr continuous   Yes Historical Provider, MD  albuterol (PROVENTIL) (2.5 MG/3ML) 0.083% nebulizer solution Take 3 mLs (2.5 mg total) by nebulization every 6 (six) hours as needed for wheezing or shortness of breath. 04/27/16  Yes [name], MD  alteplase (CATHFLO ACTIVASE) 2 MG injection 2 mg by Intracatheter route once  as needed for open catheter.   Yes Historical Provider, MD  bisacodyl (DULCOLAX) 10 MG suppository Place 1 suppository (10 mg total) rectally daily as needed for moderate constipation. Patient taking differently: Place 10 mg rectally daily as needed (for constipation).  04/27/16  Yes [name], MD  dextrose 10 % infusion Inject into the vein See admin instructions. "68 ml's/ hr as needed for supplement dextrose 10% hang if there is any interruption of TPN"   Yes Historical Provider, MD  insulin aspart (NOVOLOG) 100 UNIT/ML injection Inject 0-20 Units into the skin every 4 (four) hours. Patient taking differently: Inject 0-10 Units into the skin 4 (four) times daily -  before meals and at bedtime. Per sliding scale: BGL 0-150 = 0 units; 151-200 = 2 units; 201-250 = 4 units; 251-300 = 6 units; 301-350 = 8 units; 351-400 = 10 units; >401 = NOTIFY THE DOCTOR 04/27/16  Yes [name], MD  Insulin Glargine (LANTUS SOLOSTAR) 100 UNIT/ML Solostar Pen Inject 16 Units into the skin every morning.   Yes Historical Provider, MD  Multiple Vitamin (MULTIVITAMIN) LIQD Take 10 mLs by mouth daily.   Yes Historical Provider, MD  ondansetron (ZOFRAN) 4 MG tablet Take 4 mg by mouth See admin  instructions. Every six to eight hours as needed for nausea or vomiting   Yes Historical Provider, MD  pantoprazole (PROTONIX) 40 MG tablet Take 40 mg by mouth 2 (two) times daily.   Yes Historical Provider, MD  promethazine 25 mg in sodium chloride 0.9 % 1,000 mL Inject 12.5 mg into the vein every 6 (six) hours as needed (for nausea).   Yes Historical Provider, MD  ampicillin-sulbactam 1.5 g in sodium chloride 0.9 % 50 mL Inject 1.5 g into the vein every 6 (six) hours. 04/27/16 05/14/17  [name], MD  antiseptic oral rinse (CPC / CETYLPYRIDINIUM CHLORIDE 0.05%) 0.05 % LIQD solution 7 mLs by Mouth Rinse route 2 times daily at 12 noon and 4 pm. 04/27/16   [name], MD  chlorhexidine (PERIDEX) 0.12 % solution 15 mLs by Mouth Rinse route 2 (two) times daily. 04/27/16   [name], MD  erythromycin ophthalmic ointment Place into both eyes at bedtime. 04/27/16   [name], MD  fluconazole (DIFLUCAN) 200-0.9 MG/100ML-% IVPB Inject 100 mLs (200 mg total) into the vein daily. 04/27/16 04/17/17  [name], MD  ondansetron (ZOFRAN) 4 MG/2ML SOLN injection Inject 2 mLs (4 mg total) into the vein every 6 (six) hours as needed for nausea. 04/27/16   [name], MD  pantoprazole (PROTONIX) 40 MG injection Inject 40 mg into the vein every 12 (twelve) hours. 04/27/16   [name], MD  Potassium Chloride in NaCl (0.9 % NACL WITH KCL 20 MEQ / L) 20-0.9 MEQ/L-% Inject 30 mL/hr into the vein continuous. 04/27/16   [name], MD  sodium chloride flush (NS) 0.9 % SOLN 10-40 mLs by Intracatheter route as needed (flush). 04/27/16   [name], MD  sodium chloride flush (NS) 0.9 % SOLN Inject 3 mLs into the vein every 12 (twelve) hours. 04/27/16   [name], MD   Allergies  Allergen Reactions  . Ace Inhibitors Cough  . Prednisone Other (See Comments)    LETHARGY  . Sitagliptin Other (See Comments)    Noted on MAR   Review of Systems  Constitutional: Positive for  activity change, appetite change, fatigue and fever.  HENT: Negative.   Eyes:  Negative.   Respiratory: Positive for shortness of breath.   Cardiovascular: Negative.   Gastrointestinal: Positive for blood in stool, nausea and vomiting.  Endocrine: Negative.   Genitourinary: Negative.   Musculoskeletal: Positive for back pain.  Skin: Negative.   Allergic/Immunologic: Negative.   Neurological: Positive for weakness.  Hematological: Negative.   Psychiatric/Behavioral: Positive for decreased concentration. The patient is nervous/anxious.     Dry mouth.   Physical Exam  Constitutional: She is oriented to person, place, and time. She appears well-developed and well-nourished.  Frail pale elderly female  HENT:  Head: Normocephalic and atraumatic.  Cardiovascular: Normal rate.   Musculoskeletal: Normal range of motion.  Trace pedal edema  Neurological: She is alert and oriented to person, place, and time.  Skin: Skin is warm and dry.  pale  Psychiatric:  Affect constricted  Nursing note and vitals reviewed.  General: Tired-appearing female, in NAD HENT: MM very tacky and lips peeling.  Respiratory: No increased work of breathing.   Vital Signs: BP (!) 146/55 (BP Location: Left Arm)   Pulse 90   Temp 97.4 F (36.3 C) (Oral)   Resp 18   Ht 5\' 2"  (1.575 m)   Wt 79.5 kg (175 lb 4.3 oz)   SpO2 95%   BMI 32.06 kg/m  Pain Assessment: 0-10 POSS *See Group Information*: 2-Acceptable,Slightly drowsy, easily aroused Pain Score: 0-No pain   SpO2: SpO2: 95 % O2 Device:SpO2: 95 % O2 Flow Rate: .O2 Flow Rate (L/min): 2.5 L/min  IO: Intake/output summary:   Intake/Output Summary (Last 24 hours) at 06/08/16 1730 Last data filed at 06/08/16 1416  Gross per 24 hour  Intake             1140 ml  Output              600 ml  Net              540 ml    LBM: Last BM Date: 06/03/16 Baseline Weight: Weight: 77.2 kg (170 lb 3.2 oz) Most recent weight: Weight: 79.5 kg (175 lb 4.3 oz)       Palliative Assessment/Data:   Flowsheet Rows   Flowsheet Row Most Recent Value  Intake Tab  Referral Department  Hospitalist  Unit at Time of Referral  Med/Surg Unit  Palliative Care Primary Diagnosis  Cancer  Palliative Care Type  New Palliative care  Reason for referral  Clarify Goals of Care  Clinical Assessment  Palliative Performance Scale Score  40%  Psychosocial & Spiritual Assessment  Palliative Care Outcomes  Patient/Family meeting held?  Yes [Plan for family meeting with daughter 9/23.]  Who was at the meeting?  patient  Palliative Care Outcomes  Improved non-pain symptom therapy      Time In: 11:20 Time Out: 12:30 Time Total: 70 minutes Greater than 50%  of this time was spent counseling and coordinating care related to the above assessment and plan.  Signed by: Darci Needle, MD Family Medicine, PGY-2 Seen with NP Romona Curls.    Please contact Palliative Medicine Team phone at [phone] for questions and concerns.  For individual provider: See Shea Evans

## 2016-06-08 NOTE — Progress Notes (Signed)
Physical Therapy Treatment Patient Details Name: [name] [name] MRN: [id number] DOB: [birth year] Today's Date: 06/08/2016    History of Present Illness 81 y.o.femalewith history of degenerative disc disease, DM, diverticulosis, GERD, hiatal hernia, HLD, HTN, and chronic hyponatremia who was admitted 04/14/2016 >04/27/2016 for treatment of acute GI bleed with gastric outlet obstruction due to necrotic esophagitis with superficial fungal infection and ulceration with subsequent development of possible right-sided pneumonitis and aspiration pneumonia. She returned to the ED c/o shortness of breath, fevers, and confusion. +sepsis (?due to PICC)    PT Comments    Pt beginning to improve with mobility.  Follow Up Recommendations  SNF     Equipment Recommendations  None recommended by PT    Recommendations for Other Services       Precautions / Restrictions Precautions Precautions: Fall Precaution Comments: TPN Restrictions Weight Bearing Restrictions: No    Mobility  Bed Mobility Overal bed mobility: Needs Assistance Bed Mobility: Sit to Supine;Supine to Sit     Supine to sit: Min assist;HOB elevated Sit to supine: Min assist   General bed mobility comments: Assist to elevate trunk into sitting and to bring legs back up into bed  Transfers Overall transfer level: Needs assistance Equipment used: Rolling walker (2 wheeled);None Transfers: Sit to/from [employer] to Stand: Min assist Stand pivot transfers: Min assist       General transfer comment: Bed to bsc with stand pivot without assistive device. Assist to bring hips up and for balance.  Ambulation/Gait Ambulation/Gait assistance: Min assist Ambulation Distance (Feet): 4 Feet Assistive device: Rolling walker (2 wheeled) Gait Pattern/deviations: Step-through pattern;Decreased step length - right;Decreased step length - left;Shuffle Gait velocity: decreased Gait velocity interpretation:  Below normal speed for age/gender General Gait Details: Assist for balance and support   Stairs            Wheelchair Mobility    Modified Rankin (Stroke Patients Only)       Balance Overall balance assessment: Needs assistance Sitting-balance support: No upper extremity supported;Feet supported Sitting balance-Leahy Scale: Fair     Standing balance support: Bilateral upper extremity supported Standing balance-Leahy Scale: Poor Standing balance comment: BUE and min guard for static standing                    Cognition Arousal/Alertness: Awake/alert Behavior During Therapy: WFL for tasks assessed/performed Overall Cognitive Status: Within Functional Limits for tasks assessed                      Exercises      General Comments        Pertinent Vitals/Pain Pain Assessment: No/denies pain    Home Living                      Prior Function            PT Goals (current goals can now be found in the care plan section) Progress towards PT goals: Progressing toward goals    Frequency    Min 2X/week      PT Plan Current plan remains appropriate    Co-evaluation             End of Session Equipment Utilized During Treatment: Gait belt Activity Tolerance: Patient tolerated treatment well Patient left: with call bell/phone within reach;in bed;with bed alarm set;with family/visitor present     Time: [id number] PT Time Calculation (min) (ACUTE ONLY): 14 min  Charges:  $Gait  Training: 8-22 mins                    G Codes:      [name] [name] 07-05-2016, 4:13 PM [place]

## 2016-06-09 LAB — BASIC METABOLIC PANEL
ANION GAP: 5 (ref 5–15)
BUN: 32 mg/dL — AB (ref 6–20)
CALCIUM: 8.5 mg/dL — AB (ref 8.9–10.3)
CO2: 34 mmol/L — AB (ref 22–32)
Chloride: 104 mmol/L (ref 101–111)
Creatinine, Ser: 0.77 mg/dL (ref 0.44–1.00)
GFR calc Af Amer: >60 mL/min (ref >60)
GFR calc non Af Amer: >60 mL/min (ref >60)
GLUCOSE: 113 mg/dL — AB (ref 65–99)
Potassium: 4.1 mmol/L (ref 3.5–5.1)
Sodium: 143 mmol/L (ref 135–145)

## 2016-06-09 LAB — GLUCOSE, CAPILLARY
GLUCOSE-CAPILLARY: 115 mg/dL — AB (ref 65–99)
GLUCOSE-CAPILLARY: 123 mg/dL — AB (ref 65–99)
GLUCOSE-CAPILLARY: 168 mg/dL — AB (ref 65–99)
GLUCOSE-CAPILLARY: 183 mg/dL — AB (ref 65–99)
Glucose-Capillary: 141 mg/dL — ABNORMAL HIGH (ref 65–99)
Glucose-Capillary: 154 mg/dL — ABNORMAL HIGH (ref 65–99)
Glucose-Capillary: 162 mg/dL — ABNORMAL HIGH (ref 65–99)

## 2016-06-09 LAB — CULTURE, BLOOD (ROUTINE X 2)
CULTURE: NO GROWTH
Culture: NO GROWTH

## 2016-06-09 LAB — STREP PNEUMONIAE URINARY ANTIGEN: STREP PNEUMO URINARY ANTIGEN: NEGATIVE

## 2016-06-09 MED ORDER — SODIUM CHLORIDE 0.9 % IV BOLUS (SEPSIS)
500.0000 mL | Freq: Once | INTRAVENOUS | Status: AC
  Administered 2016-06-09: 500 mL via INTRAVENOUS

## 2016-06-09 MED ORDER — ONDANSETRON HCL 4 MG/2ML IJ SOLN
4.0000 mg | Freq: Three times a day (TID) | INTRAMUSCULAR | Status: DC
  Administered 2016-06-09 – 2016-06-13 (×12): 4 mg via INTRAVENOUS
  Filled 2016-06-09 (×12): qty 2

## 2016-06-09 MED ORDER — TRACE MINERALS CR-CU-MN-SE-ZN 10-1000-500-60 MCG/ML IV SOLN
INTRAVENOUS | Status: AC
  Administered 2016-06-09 (×2): via INTRAVENOUS
  Filled 2016-06-09: qty 1556

## 2016-06-09 MED ORDER — FAT EMULSION 20 % IV EMUL
234.0000 mL | INTRAVENOUS | Status: AC
  Administered 2016-06-09: 234 mL via INTRAVENOUS
  Filled 2016-06-09 (×2): qty 250

## 2016-06-09 MED ORDER — MORPHINE SULFATE (PF) 2 MG/ML IV SOLN
1.0000 mg | INTRAVENOUS | Status: DC | PRN
  Administered 2016-06-10 – 2016-06-11 (×3): 1 mg via INTRAVENOUS
  Filled 2016-06-09 (×3): qty 1

## 2016-06-09 NOTE — Progress Notes (Signed)
PROGRESS NOTE    [name] [name]  [id number] DOB: 03-Jul-1935 DOA: 05/03/2016 PCP: [name], MD   Brief Narrative:  81 y.o.WF PMHx degenerative disc disease, DM Type 2 controlled with complications, Diverticulosis, GERD, Hiatal Hernia, HLD, HTN, and Chronic Hyponatremia   who was admitted 04/14/2016 > 04/27/2016 for treatment of acute GI bleed with gastric outlet obstruction due to necrotic esophagitis with superficial fungal infection and ulceration with subsequent development of possible right-sided pneumonitis and aspiration pneumonia. Patient was discharged on Unasyn and Diflucan and completed her course on 05/14/2016.  A Left arm PICC was placed for home Unasyn and TPN. She returned to the ED c/o shortness of breath and fevers. In the ED she was noted to be confused.     Subjective: Awaiting meeting with palliative care specialist. Pt has had emesis this morning   Assessment & Plan:   Principal Problem:   Fungemia Active Problems:   DM2 (diabetes mellitus, type 2) ([place])   Gastric outlet obstruction   Esophageal necrosis   Sepsis ([place])   Acute respiratory failure ([place])   Acute encephalopathy   On total parenteral nutrition (TPN)   Normocytic anemia   Essential hypertension   Erosive esophagitis   Candida glabrata infection   Gastric mass   Gastric adenocarcinoma (H[facility])   Escherichia coli infection   Bacteremia due to Klebsiella pneumoniae   Acute respiratory failure with hypoxia ([facility])   Disorientation   Hyponatremia   Controlled type 2 diabetes mellitus without complication ([place])   Palliative care encounter   Sepsis unspecified organism /Fungemia (positive Candida glabrata) -still febrile - Completed 14 day course of Anidulafungin per ID -remains on TPN, pre-albumin 6  positive Escherichia coli UTI/ Klebsiella+ Enterobacteriaceae species Bacteremia - Last 24 hours afebrile  - continue empiric IV Ceftriaxone. Sensitive to ceftriaxone - Switch to PO  Ceftin for 7 day total treatment once afebrile > 24 hours.  Acute hypoxic respiratory failure / AMS -Resolved  Acute GI bleed / Esophageal necrosis / Gastric outlet obstruction with bowel ulceration -EGD: Gastric tumor, esophagitis  -8/31 biopsy: See results below   Gastric tumor Stomach/pyloric channel mass- SUSPICIOUS FOR ADENOCARCINOMA -8/31 biopsy tumor pending -CCS recommendations: Not surgical candidate due to nutritional status -After speaking with [facility] today another EGD would not change plan. Patient needs resection of affected area. -Continue TPN. NG tube NOT PLACED Postpyloric by IR  -9/21 Spoke with PA Hilbert Odor [facility] and currently not candidate for surgical intervention. Unless nutritional status significantly improves. Will continue to follow along  Chronic Hyponatremia -Appears stable   Recent Labs Lab 06/04/16 0500 06/06/16 0520 06/07/16 0500 06/08/16 0415 06/09/16 0415  NA 141 141 141 138 143   Hypokalemia -Resolved  Hypomagnesemia - resolved after replacement.  Normocytic anemia(Baseline Hg ~9.0)  Recent Labs Lab 06/03/16 0430 06/04/16 1250 06/06/16 0520  HGB 8.7* 8.5* 8.9*   HTN -Hydralazine PRN -Echocardiogram: Essentially normal see results below   DM type 2 controlled without complication -[id number] Hemoglobin A1c = 6.6 -Moderate SSI   DVT prophylaxis: SCD Code Status: DO NOT RESUSCITATE Family Communication: None Disposition Plan: Awaiting discussion with palliative care team.    Consultants:  Dr.Kavitha V [facility] Dr. Ralene Ok [facility]  Procedures/Significant Events:  8/31 EGD:- Severe reflux esophagitis. Biopsied.- Gastric tumor at the pylorus and in the prepyloric region of the stomach.  Biopsied.- Gastric outlet obstruction 8/31 Biopsy Stomach/pyloric channel mass- SUSPICIOUS FOR ADENOCARCINOMA. - Esophagus, biopsy:- EXUDATE WITH FOCAL NECROSIS C/W ULCER. Negative malignancy 9/1 Echocardiogram:-  Left ventricle:  mild focal basal hypertrophy of the septum. -LVEF =60% to 65%. 9/5 placement postpyloric NG tube                         Cultures 8/29 blood left hand /right AC positive Candida glabrata 8/29 urine negative 8/29 MRSA by PCR negative 8/31 blood left/right hand negative Final 9/18 blood positive Klebsiella pneumoniae/Enterobacteriaceae species/Candida glabrata 9/18 blood left arm 2 NGTD 9/19 urine positive Escherichia coli   Antimicrobials: Anidulafungin 8/31>>9/13 Ceftriaxone 9/19>>  Devices  LINES / TUBES:  PICC line 9/7>>  Continuous Infusions: . sodium chloride 10 mL/hr at 06/08/16 1535  . Marland KitchenTPN (CLINIMIX-E) Adult     And  . fat emulsion 234 mL (06/08/16 1754)  . Marland KitchenTPN (CLINIMIX-E) Adult     And  . fat emulsion       Objective: Vitals:   06/08/16 2119 06/09/16 0554 06/09/16 0728 06/09/16 1021  BP: (!) 136/47 (!) 172/64    Pulse: 83 (!) 107    Resp: 18 18 20    Temp: 99.1 F (37.3 C) 97.8 F (36.6 C) (!) 101.7 F (38.7 C) 100.1 F (37.8 C)  TempSrc: Oral Oral Axillary Axillary  SpO2: 98% 90% 96%   Weight:  81.2 kg (179 lb 0.2 oz)    Height:        Intake/Output Summary (Last 24 hours) at 06/09/16 1216 Last data filed at 06/09/16 0815  Gross per 24 hour  Intake           383.97 ml  Output              400 ml  Net           -16.03 ml   Filed Weights   06/07/16 0719 06/08/16 0520 06/09/16 0554  Weight: 76.6 kg (168 lb 12.8 oz) 79.5 kg (175 lb 4.3 oz) 81.2 kg (179 lb 0.2 oz)    Examination:  General: Alert and awake, Follows commands, No acute respiratory distress Eyes: negative scleral hemorrhage, negative anisocoria, negative icterus ENT: Negative Runny nose, negative gingival bleeding, Neck:  Negative scars, masses, torticollis, lymphadenopathy, JVD Lungs: Clear to auscultation bilaterally without wheezes or crackles Cardiovascular: Regular rate and rhythm without murmur gallop or rub normal S1 and S2 Abdomen: negative abdominal pain, nondistended,  positive soft, bowel sounds, no rebound, no ascites, no appreciable mass Extremities: No significant cyanosis, clubbing, or edema bilateral lower extremities Skin: Negative rashes, lesions, ulcers Psychiatric:  Negative depression, negative anxiety, negative fatigue, negative mania  Central nervous system:  No facial asymmetry, tongue/uvula midline, all extremities muscle strength 5/5, sensation intact throughout,negative dysarthria, negative expressive aphasia, negative receptive aphasia.   CBC:  Recent Labs Lab 06/03/16 0430 06/04/16 1250 06/06/16 0520  WBC 8.8 8.1 7.4  NEUTROABS  --  6.2  --   HGB 8.7* 8.5* 8.9*  HCT 27.1* 27.5* 28.5*  MCV 82.1 83.1 82.4  PLT 95* 73* [id number]*   Basic Metabolic Panel:  Recent Labs Lab 06/03/16 1106 06/04/16 0500 06/06/16 0520 06/07/16 0500 06/08/16 0415 06/09/16 0415  NA  --  141 141 141 138 143  K  --  3.7 2.8* 3.5 3.8 4.1  CL  --  106 103 104 101 104  CO2  --  25 29 31  32 34*  GLUCOSE  --  286* 124* 133* 151* 113*  BUN  --  28* 31* 33* 30* 32*  CREATININE  --  0.83 0.85 0.78 0.68 0.77  CALCIUM  --  8.4* 8.0* 7.9* 8.0* 8.5*  MG 1.5* 1.7  --  1.6* 2.4  --   PHOS 2.5 2.9  --  3.9  --   --    GFR: Estimated Creatinine Clearance: 57.2 mL/min (by C-G formula based on SCr of 0.77 mg/dL). Liver Function Tests:  Recent Labs Lab 06/04/16 0500 06/07/16 0500  AST 58* 51*  ALT 53 36  ALKPHOS 212* 146*  BILITOT 0.4 0.4  PROT 5.9* 5.0*  ALBUMIN 2.0* 1.7*   No results for input(s): LIPASE, AMYLASE in the last 168 hours. No results for input(s): AMMONIA in the last 168 hours. Coagulation Profile: No results for input(s): INR, PROTIME in the last 168 hours. Cardiac Enzymes: No results for input(s): CKTOTAL, CKMB, CKMBINDEX, TROPONINI in the last 168 hours. BNP (last 3 results) No results for input(s): PROBNP in the last 8760 hours. HbA1C: No results for input(s): HGBA1C in the last 72 hours. CBG:  Recent Labs Lab 06/08/16 1952  06/09/16 0010 06/09/16 0346 06/09/16 0833 06/09/16 1151  GLUCAP 159* 154* 115* 123* 168*   Lipid Profile: No results for input(s): CHOL, HDL, LDLCALC, TRIG, CHOLHDL, LDLDIRECT in the last 72 hours. Thyroid Function Tests: No results for input(s): TSH, T4TOTAL, FREET4, T3FREE, THYROIDAB in the last 72 hours. Anemia Panel: No results for input(s): VITAMINB12, FOLATE, FERRITIN, TIBC, IRON, RETICCTPCT in the last 72 hours. Urine analysis:    Component Value Date/Time   COLORURINE YELLOW 06/04/2016 1200   APPEARANCEUR CLOUDY (A) 06/04/2016 1200   LABSPEC 1.023 06/04/2016 1200   PHURINE 6.0 06/04/2016 1200   GLUCOSEU NEGATIVE 06/04/2016 1200   HGBUR NEGATIVE 06/04/2016 1200   BILIRUBINUR SMALL (A) 06/04/2016 1200   KETONESUR NEGATIVE 06/04/2016 1200   PROTEINUR 100 (A) 06/04/2016 1200   NITRITE POSITIVE (A) 06/04/2016 1200   LEUKOCYTESUR MODERATE (A) 06/04/2016 1200   Sepsis Labs: @LABRCNTIP (procalcitonin:4,lacticidven:4)  ) Recent Results (from the past 240 hour(s))  Culture, blood (routine x 2)     Status: Abnormal   Collection Time: 06/04/16 10:10 AM  Result Value Ref Range Status   Specimen Description BLOOD SOURCE UNKNOWN  Final   Special Requests BOTTLES DRAWN AEROBIC AND ANAEROBIC 5CC  Final   Culture  Setup Time   Final    GRAM NEGATIVE RODS IN BOTH AEROBIC AND ANAEROBIC BOTTLES CRITICAL RESULT CALLED TO, READ BACK BY AND VERIFIED WITH: CARON AMEND,PHARMD @0325  06/05/16 MKELLY    Culture KLEBSIELLA PNEUMONIAE (A)  Final   Report Status 06/07/2016 FINAL  Final   Organism ID, Bacteria KLEBSIELLA PNEUMONIAE  Final      Susceptibility   Klebsiella pneumoniae - MIC*    AMPICILLIN >=32 RESISTANT Resistant     CEFAZOLIN <=4 SENSITIVE Sensitive     CEFEPIME <=1 SENSITIVE Sensitive     CEFTAZIDIME <=1 SENSITIVE Sensitive     CEFTRIAXONE <=1 SENSITIVE Sensitive     CIPROFLOXACIN <=0.25 SENSITIVE Sensitive     GENTAMICIN <=1 SENSITIVE Sensitive     IMIPENEM <=0.25  SENSITIVE Sensitive     TRIMETH/SULFA <=20 SENSITIVE Sensitive     AMPICILLIN/SULBACTAM 16 INTERMEDIATE Intermediate     PIP/TAZO <=4 SENSITIVE Sensitive     Extended ESBL NEGATIVE Sensitive     * KLEBSIELLA PNEUMONIAE  Blood Culture ID Panel (Reflexed)     Status: Abnormal   Collection Time: 06/04/16 10:10 AM  Result Value Ref Range Status   Enterococcus species NOT DETECTED NOT DETECTED Final   Listeria monocytogenes NOT DETECTED NOT DETECTED Final   Staphylococcus species NOT DETECTED  NOT DETECTED Final   Staphylococcus aureus NOT DETECTED NOT DETECTED Final   Streptococcus species NOT DETECTED NOT DETECTED Final   Streptococcus agalactiae NOT DETECTED NOT DETECTED Final   Streptococcus pneumoniae NOT DETECTED NOT DETECTED Final   Streptococcus pyogenes NOT DETECTED NOT DETECTED Final   Acinetobacter baumannii NOT DETECTED NOT DETECTED Final   Enterobacteriaceae species DETECTED (A) NOT DETECTED Final    Comment: CRITICAL RESULT CALLED TO, READ BACK BY AND VERIFIED WITH: CARON AMEND,PHARMD @0325  06/05/16 MKELLY    Enterobacter cloacae complex NOT DETECTED NOT DETECTED Final   Escherichia coli NOT DETECTED NOT DETECTED Final   Klebsiella oxytoca NOT DETECTED NOT DETECTED Final   Klebsiella pneumoniae DETECTED (A) NOT DETECTED Final    Comment: CRITICAL RESULT CALLED TO, READ BACK BY AND VERIFIED WITH: CARON AMEND,PHARMD @0325  06/05/16 MKELLY    Proteus species NOT DETECTED NOT DETECTED Final   Serratia marcescens NOT DETECTED NOT DETECTED Final   Carbapenem resistance NOT DETECTED NOT DETECTED Final   Haemophilus influenzae NOT DETECTED NOT DETECTED Final   Neisseria meningitidis NOT DETECTED NOT DETECTED Final   Pseudomonas aeruginosa NOT DETECTED NOT DETECTED Final   Candida albicans NOT DETECTED NOT DETECTED Final   Candida glabrata NOT DETECTED NOT DETECTED Final   Candida krusei NOT DETECTED NOT DETECTED Final   Candida parapsilosis NOT DETECTED NOT DETECTED Final    Candida tropicalis NOT DETECTED NOT DETECTED Final  Culture, blood (routine x 2)     Status: None (Preliminary result)   Collection Time: 06/04/16 11:05 AM  Result Value Ref Range Status   Specimen Description BLOOD LEFT ARM  Final   Special Requests BOTTLES DRAWN AEROBIC ONLY 2CC  Final   Culture NO GROWTH 4 DAYS  Final   Report Status PENDING  Incomplete  Culture, blood (routine x 2)     Status: None (Preliminary result)   Collection Time: 06/04/16 11:13 AM  Result Value Ref Range Status   Specimen Description BLOOD LEFT ARM  Final   Special Requests BOTTLES DRAWN AEROBIC ONLY 2CC  Final   Culture NO GROWTH 4 DAYS  Final   Report Status PENDING  Incomplete  Culture, Urine     Status: Abnormal   Collection Time: 06/05/16  2:09 PM  Result Value Ref Range Status   Specimen Description URINE, RANDOM  Final   Special Requests NONE  Final   Culture >=100,000 COLONIES/mL ESCHERICHIA COLI (A)  Final   Report Status 06/07/2016 FINAL  Final   Organism ID, Bacteria ESCHERICHIA COLI (A)  Final      Susceptibility   Escherichia coli - MIC*    AMPICILLIN 8 SENSITIVE Sensitive     CEFAZOLIN <=4 SENSITIVE Sensitive     CEFTRIAXONE <=1 SENSITIVE Sensitive     CIPROFLOXACIN <=0.25 SENSITIVE Sensitive     GENTAMICIN <=1 SENSITIVE Sensitive     IMIPENEM <=0.25 SENSITIVE Sensitive     NITROFURANTOIN <=16 SENSITIVE Sensitive     TRIMETH/SULFA <=20 SENSITIVE Sensitive     AMPICILLIN/SULBACTAM 4 SENSITIVE Sensitive     PIP/TAZO <=4 SENSITIVE Sensitive     Extended ESBL NEGATIVE Sensitive     * >=100,000 COLONIES/mL ESCHERICHIA COLI         Radiology Studies: No results found.      Scheduled Meds: . antiseptic oral rinse  7 mL Mouth Rinse q12n4p  . cefTRIAXone (ROCEPHIN)  IV  2 g Intravenous Q24H  . chlorhexidine  15 mL Mouth Rinse BID  . insulin aspart  0-15 Units  Subcutaneous Q4H  . phenol  1 spray Mouth/Throat BID  . sodium chloride flush  10-40 mL Intracatheter Q12H  . sodium  chloride flush  3 mL Intravenous Q12H   Continuous Infusions: . sodium chloride 10 mL/hr at 06/08/16 1535  . Marland KitchenTPN (CLINIMIX-E) Adult     And  . fat emulsion 234 mL (06/08/16 1754)  . Marland KitchenTPN (CLINIMIX-E) Adult     And  . fat emulsion       LOS: 25 days    Time spent: 35 minutes   Velvet Bathe, MD [facility] Pager [phone]   If 7PM-7AM, please contact night-coverage [email or web address] Password [facility] 06/09/2016, 12:16 PM

## 2016-06-09 NOTE — Progress Notes (Signed)
At the shift 0705, pt shivering, temp 98.8, at 0710 temp 101.7, HR in 130s Tylenol 650 rectal given. Patient had 1 occurrence of brown, liquid emesis. MD notified.

## 2016-06-09 NOTE — Progress Notes (Addendum)
Daily Progress Note   Patient Name: [name] [name]       Date: 06/09/2016 DOB: 1935/03/30  Age: 81 y.o. MRN#: [id number] Attending Physician: Velvet Bathe, MD Primary Care Physician: [name], MD Admit Date: 04/20/2016  Reason for Consultation/Follow-up: Establishing goals of care, Non pain symptom management, Pain control and Psychosocial/spiritual support  Subjective: Patient began vomiting yesterday, and has vomited once today. Zofran was ineffective on an as-needed basis and patient was given Phenergan 12.5 mg. She became quite sedated and rapid response team was called. She did subsequently improve. I met with patient and her daughter [name] and her stepdaughter Caryl Asp, Joy's husband Kerry Dory, and granddaughter Lindell Noe today to discuss clinical condition, options as well as goals of care. Patient was quite somnolent today. She is very weak. She is unable to assist with turning in the bed or even leaning forward slightly to reposition her pillow.  Had a lengthy conversation with her family outside of the room where CODE STATUS terms were explained. Did reiterate the patient is now a full code. I did share my concerns about her overall clinical condition and her ability to withstand chest compressions, defibrillation, and especially intubation with these extensiveness of her necrotic esophagitis. Daughter and stepdaughter were able to verbalize that they did not see her being able to withstand this measures and would be in favor of a DO NOT RESUSCITATE; they do want to ask the pt what her wishes are. Later in the room we did attempt this but she was too somnolent to participate.  We also discussed the tumor, more in depth explanations regarding her weakness, her protein calorie malnutrition and how  this impacts her ability to withstand surgery and inability to heal any type of abdominal wound. Her stepdaughter Caryl Asp is medically well-informed and asks very good questions. Her daughter states in retrospect she is able to see a functional decline going back to December 2016. She is able to verbalize more understanding of the tumor and why she is not an operative candidate in terms of  something major as a full resection. We also discussed the biopsy results and that my opinion was that she could except that this is adenocarcinoma at this point. We also discussed more in length that further evaluation from a biopsy standpoint would be geared towards ascertaining specific cell  type to drive treatment in terms of chemotherapy therapy. We also discussed radiation therapy and how patient is too debilitated at this point to withstand these aggressive measures. Family readily concurred with that assessment. On 06/08/2016, I did speak with surgical services about the possibility of a venting PEG / G-tube for symptom management/palliative purposes only. This does seem to be a viable option for patient from this initial discussion. This would provide symptomatic relief from  consistent vomiting she has been dealing with secondary to her gastric outlet obstruction. She would also be able  to drink for comfort. They do understand that she is not absorbing any nutrition from this procedure and it is not designed to improve her protein calorie malnutrition. We did talk again that despite TPN since 04/18/2016 her albumin is still at 1.7. I feel as though by the end of this discussion the family is slowly reaching understanding of how sick their loved one is. I did reference it was my opinion that she is trending towards end-of-life. Family is tearful but I do believe they recognized this as well.  Length of Stay: 25  Current Medications: Scheduled Meds:  . antiseptic oral rinse  7 mL Mouth Rinse q12n4p  . cefTRIAXone  (ROCEPHIN)  IV  2 g Intravenous Q24H  . chlorhexidine  15 mL Mouth Rinse BID  . insulin aspart  0-15 Units Subcutaneous Q4H  . ondansetron (ZOFRAN) IV  4 mg Intravenous Q8H  . phenol  1 spray Mouth/Throat BID  . sodium chloride  500 mL Intravenous Once  . sodium chloride flush  10-40 mL Intracatheter Q12H  . sodium chloride flush  3 mL Intravenous Q12H    Continuous Infusions: . sodium chloride 10 mL/hr at 06/08/16 1535  . Marland KitchenTPN (CLINIMIX-E) Adult Stopped (06/09/16 1241)   And  . fat emulsion Stopped (06/09/16 1241)  . Marland KitchenTPN (CLINIMIX-E) Adult     And  . fat emulsion      PRN Meds: acetaminophen, hydrALAZINE, ipratropium-albuterol, menthol-cetylpyridinium, morphine injection, ondansetron, phenol, sodium chloride flush, sodium chloride flush  Physical Exam          Vital Signs: BP (!) 172/64 (BP Location: Left Arm)   Pulse (!) 107   Temp 100.1 F (37.8 C) (Axillary)   Resp 20   Ht '5\' 2"'$  (1.575 m)   Wt 81.2 kg (179 lb 0.2 oz)   SpO2 96%   BMI 32.74 kg/m  SpO2: SpO2: 96 % O2 Device: O2 Device: Nasal Cannula O2 Flow Rate: O2 Flow Rate (L/min): 3 L/min  Intake/output summary:  Intake/Output Summary (Last 24 hours) at 06/09/16 1341 Last data filed at 06/09/16 0815  Gross per 24 hour  Intake           383.97 ml  Output              400 ml  Net           -16.03 ml   LBM: Last BM Date: 06/06/16 Baseline Weight: Weight: 77.2 kg (170 lb 3.2 oz) Most recent weight: Weight: 81.2 kg (179 lb 0.2 oz)       Palliative Assessment/Data:    Flowsheet Rows   Flowsheet Row Most Recent Value  Intake Tab  Referral Department  Hospitalist  Unit at Time of Referral  Med/Surg Unit  Palliative Care Primary Diagnosis  Cancer  Palliative Care Type  New Palliative care  Reason for referral  Clarify Goals of Care  Clinical Assessment  Palliative Performance Scale Score  40%  Psychosocial & Spiritual Assessment  Palliative Care Outcomes  Patient/Family meeting held?  Yes [Plan for  family meeting with daughter 9/23.]  Who was at the meeting?  patient  Palliative Care Outcomes  Improved non-pain symptom therapy      Patient Active Problem List   Diagnosis Date Noted  . Palliative care encounter   . Escherichia coli infection   . Bacteremia due to Klebsiella pneumoniae   . Acute respiratory failure with hypoxia ([place])   . Disorientation   . Hyponatremia   . Controlled type 2 diabetes mellitus without complication ([place])   . Gastric mass   . Gastric adenocarcinoma ([place])   . Fungemia 05/12/2016  . Erosive esophagitis   . Candida glabrata infection   . Sepsis ([place]) 05/12/2016  . Acute respiratory failure ([place]) 05/05/2016  . Acute encephalopathy 04/27/2016  . On total parenteral nutrition (TPN) 04/22/2016  . Normocytic anemia 05/13/2016  . Essential hypertension 04/21/2016  . 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 GERD with stricture 04/25/2016  . Upper GI bleed   . Gastric tumor   . Aspiration into airway   . Gastric outlet obstruction   . Melena 04/15/2016  . Fall at home 04/15/2016  . Intramural leiomyoma of uterus 08/25/2015  . Urinary incontinence 06/02/2013  . Nocturia 06/02/2013  . Rectocele 06/02/2013  . Vaginal atrophy 06/02/2013  . Sebaceous cyst of labia 05/01/2013  . DM2 (diabetes mellitus, type 2) ([place])   . Reflux   . Sleep apnea     Palliative Care Assessment & Plan   Patient Profile: 82 y.o.WF PMHx degenerative disc disease, DM Type 2 controlled with complications, Diverticulosis, GERD, Hiatal Hernia, HLD, HTN, and Chronic Hyponatremia  who was admitted 04/14/2016 >04/27/2016 for treatment of acute GI bleed with gastric outlet obstruction due to necrotic esophagitis with superficial fungal infection and ulceration with subsequent development of possible right-sided pneumonitis and aspiration pneumonia. Patient was discharged on Unasyn and Diflucan and completed her course on 05/14/2016. A Left arm PICC was placedfor home Unasyn and TPN.  She returned to the ED c/o shortness of breath and fevers. In the ED she was noted to be confused.  She has continued to decline, very weak, vomiting.   Assessment: Patient is very somnolent, complaining of nausea, pale and extremely weak. She is unable to assist with any care including turning in the bed leaning forward. She began vomiting yesterday afternoon and has vomited once so far today. Her right upper extremity is markedly more edematous than her left Patient states movement makes her vomit as well as causes for abdominal pain which she rates as an 8 on a scale of 1-10 with movement. She is quite stoic and I am concerned that she is having more pain than she articulates.  Recommendations/Plan:  Nausea: Overall plan is to consult with surgery hopefully on Monday to see if they can offer an option such as a fainting PEG or J-tube. We'll schedule Zofran 4 mg every 6 hours and continue as needed dosing. Per family this was not very helpful on 06/08/2016 but perhaps on a scheduled basis we can stay ahead of this nausea. Also since movement is making her vomit we'll insert a Foley catheter to minimize toileting movement  Right upper extremity swelling: Concerned that patient as she has cancer is developing coagulopathy. Updated Dr. Wendee Beavers who has agreed to order study to rule out DVT  Pain: Patient is verbalizing pain with movement as an 8 on a scale of 1-10. We'll add low-dose  morphine, 1 mg every 3 hours on an as-needed basis. Patient is drug nave and easily sedated.  Surgical consult: After discussion with surgical services on Friday about palliative interventions we will attempt to arrange a meeting on Monday to facilitate this discussion. Her stepdaughter generally and her husband Kerry Dory can be present to provide bedside assistant to patient during this conversation and daughter can be available via speaker phone. Palliative medicine to call surgical services on Monday morning and attempt to  arrange this time. We will call her daughters to arrange a meeting  Goals of Care and Additional Recommendations:  Limitations on Scope of Treatment: Full Scope Treatment  Code Status:    Code Status Orders        Start     Ordered   05/31/16 1536  Full code  Continuous     05/31/16 1536    Code Status History    Date Active Date Inactive Code Status Order ID Comments User Context   04/26/2016  4:53 PM 05/31/2016  3:36 PM DNR [id number]  Waldemar Dickens, MD ED   04/15/2016  5:28 PM 04/27/2016  6:08 PM Full Code [id number]  Thurnell Lose, MD Inpatient       Prognosis:  To be determined, but I would not be surprised if patient had a very limited prognosis in the setting of inoperable gastric cancer with gastric outlet obstruction, albumin 1.7 in the setting of TPN since 04/18/2016, fungemia, asp pna, now bedbound markedly weaker  Discharge Planning:  To Be Determined  Care plan was discussed with Dr. Wendee Beavers  Thank you for allowing the Palliative Medicine Team to assist in the care of this patient.   Time In: 1000 Time Out: 1200 Total Time 120 min Prolonged Time Billed  yes       Greater than 50%  of this time was spent counseling and coordinating care related to the above assessment and plan.  Dory Horn, NP  Please contact Palliative Medicine Team phone at [phone] for questions and concerns.

## 2016-06-09 NOTE — Progress Notes (Signed)
PARENTERAL NUTRITION CONSULT NOTE - FOLLOW UP  Pharmacy Consult for TPN Indication: Gastric outlet obstruction  Allergies  Allergen Reactions  . Ace Inhibitors Cough  . Prednisone Other (See Comments)    LETHARGY  . Sitagliptin Other (See Comments)    Noted on [facility]    Patient Measurements: Height: '5\' 2"'$  (157.5 cm) Weight: 179 lb 0.2 oz (81.2 kg) IBW/kg (Calculated) : 50.1 Adjusted Body Weight: 75 Usual Weight: 80  Vital Signs: Temp: 101.7 F (38.7 C) (09/23 0728) Temp Source: Oral (09/23 0554) BP: 172/64 (09/23 0554) Pulse Rate: 107 (09/23 0554) Intake/Output from previous day: 09/22 0701 - 09/23 0700 In: 384 [I.V.:334; IV Piggyback:50] Out: 300 [Urine:300] Intake/Output from this shift: No intake/output data recorded.  Labs: No results for input(s): WBC, HGB, HCT, PLT, APTT, INR in the last 72 hours.   Recent Labs  06/07/16 0500 06/08/16 0415 06/09/16 0415  NA 141 138 143  K 3.5 3.8 4.1  CL 104 101 104  CO2 31 32 34*  GLUCOSE 133* 151* 113*  BUN 33* 30* 32*  CREATININE 0.78 0.68 0.77  CALCIUM 7.9* 8.0* 8.5*  MG 1.6* 2.4  --   PHOS 3.9  --   --   PROT 5.0*  --   --   ALBUMIN 1.7*  --   --   AST 51*  --   --   ALT 36  --   --   ALKPHOS 146*  --   --   BILITOT 0.4  --   --    Estimated Creatinine Clearance: 57.2 mL/min (by C-G formula based on SCr of 0.77 mg/dL).    Recent Labs  06/08/16 1952 06/09/16 0010 06/09/16 0346  GLUCAP 159* 154* 115*    Medications:  Scheduled:  . antiseptic oral rinse  7 mL Mouth Rinse q12n4p  . cefTRIAXone (ROCEPHIN)  IV  2 g Intravenous Q24H  . chlorhexidine  15 mL Mouth Rinse BID  . insulin aspart  0-15 Units Subcutaneous Q4H  . phenol  1 spray Mouth/Throat BID  . sodium chloride flush  10-40 mL Intracatheter Q12H  . sodium chloride flush  3 mL Intravenous Q12H    Insulin Requirements in the past 24 hours:  10units moderate SSI + 40 units regular insulin in TPN  Assessment: 51 YOF recently admitted to  [facility] with gastric outlet obstruction due to necrotic esophagitis and discharged on home Unasyn and TPN to [facility]. Admitted to [facility] 05/09/2016 with shortness of breath, fever, and confusion. PICC line was removed on admission due to concern for line infection - found to have fungemia. She is to continue nutrition support with TPN.  GI: Pepcid in TPN. IR unable to place postpyloric tube d/t obstruction. Bx suspicious for adenocarcinoma with plan for resection and pyloroplasty vs GJ once nutritionalstatus improves, but may pursue palliative approach.  GOC- considering a palliative vented GT to allow pt to drink.  (+)emesis this AM.  Prealbumin 3.5 >6.1, slow rise d/t inflammation. Endo: hx DM - CBGs currently controlled (115-159), have been quite variable and required frequent insulin adjustment in TPN(insulin in TPN range: 20-50 units) Lytes: hyponatremia prior to TPN initiation. Na added to TPN 9/10 per [facility] request - Na 141 x 4 days with NaCL added to TPN, down to 138 this AM. K+ 4.1, others WNL Renal: SCr stable, BUN 31, net+16L since admit.  UOP 0.25m/kg/hr Cards: BP soft-high and hr wnl-tachy- Lasix d/t hypervolemia- stopped 9/20. Hepatobil: alk phos elevated, AST mildly elevated, tbili /  TG WNL. Expect LFTs to rise given no gut stimulation. Pulm: up to [place]-3L, sats 90-100s ID: Zosyn>CTXfor Kleb bacteremia. S/p 14d [name] for C.glabrata bacteremia - WBC trending down (9/20).  Tc 101.7 with shivering this AM. Neuro:  Rapid response 9/22 afternoon d/t change in LOC  Best Practices: SCDs TPN Access: PICC replaced 05/24/16 TPN start date: on TPN PTA (since 04/19/16 per Surgery)  TPN at [facility]: (information obtained on 8/30 from [place] - pharmacist at [place]) Clinimix E 5/15 1440 ml + 20% lipids 192 ml 35 units of regular insulin ordered for each bag - per discussion there is question if this was actually being added to each bag at the facility  Current  Nutrition: TPN  Nutritional Goals: 1500-1700 kCal and 70-80 grams of protein per day  Plan: - Continue cyclic Clinimix E 8/89, infuse 1536m over 18 hrs: 50 ml/hr x 1 hr, then 91 ml/hr x 16 hrs, then 50 ml/hr x 1 hr - Lipids 13 ml/hr x 18 hrs - TPN provides approximately 1588 kCal and 78 gm of protein per day, meeting 100% of patient's needs. - Daily multivitamin and trace elementsin TPN - Continue moderateSSI Q4H + 40 units of regular insulin in TPN - Pepcid '40mg'$  in TPN - Continue to add Na 653m NaCL to make Clinimix 1/2NS concentration (sodium content 77 mEq/L) - F/U volume status and CBGs. Consider another trial of cyclicTPN over 16 hours. - BMet in AM - TPN labs qMon/Thurs   KeGracy BruinsPharmD Clinical Pharmacist [facility]

## 2016-06-10 ENCOUNTER — Inpatient Hospital Stay (HOSPITAL_COMMUNITY): Payer: Medicare Other

## 2016-06-10 DIAGNOSIS — M7989 Other specified soft tissue disorders: Secondary | ICD-10-CM

## 2016-06-10 LAB — GLUCOSE, CAPILLARY
GLUCOSE-CAPILLARY: 122 mg/dL — AB (ref 65–99)
GLUCOSE-CAPILLARY: 147 mg/dL — AB (ref 65–99)
GLUCOSE-CAPILLARY: 166 mg/dL — AB (ref 65–99)
Glucose-Capillary: 136 mg/dL — ABNORMAL HIGH (ref 65–99)
Glucose-Capillary: 113 mg/dL — ABNORMAL HIGH (ref 65–99)
Glucose-Capillary: 169 mg/dL — ABNORMAL HIGH (ref 65–99)

## 2016-06-10 MED ORDER — FAT EMULSION 20 % IV EMUL
234.0000 mL | INTRAVENOUS | Status: AC
  Administered 2016-06-10: 234 mL via INTRAVENOUS
  Filled 2016-06-10 (×2): qty 250

## 2016-06-10 MED ORDER — TRACE MINERALS CR-CU-MN-SE-ZN 10-1000-500-60 MCG/ML IV SOLN
INTRAVENOUS | Status: AC
  Administered 2016-06-10: 19:00:00 via INTRAVENOUS
  Filled 2016-06-10: qty 1556

## 2016-06-10 NOTE — Progress Notes (Signed)
PARENTERAL NUTRITION CONSULT NOTE - FOLLOW UP  Pharmacy Consult for TPN Indication: Gastric outlet obstruction  Allergies  Allergen Reactions  . Ace Inhibitors Cough  . Prednisone Other (See Comments)    LETHARGY  . Sitagliptin Other (See Comments)    Noted on [facility]    Patient Measurements: Height: _0  (157.5 cm) Weight: 179 lb 0.2 oz (81.2 kg) IBW/kg (Calculated) : 50.1 Adjusted Body Weight: 75 Usual Weight: 80  Vital Signs: Temp: 97.7 F (36.5 C) (09/24 0500) Temp Source: Oral (09/24 0500) BP: 142/52 (09/24 0500) Pulse Rate: 86 (09/24 0500) Intake/Output from previous day: 09/23 0701 - 09/24 0700 In: 1402.5 [I.V.:852.5; IV Piggyback:550] Out: 9147 [Urine:975; Emesis/NG output:300] Intake/Output from this shift: No intake/output data recorded.  Labs: No results for input(s): WBC, HGB, HCT, PLT, APTT, INR in the last 72 hours.   Recent Labs  06/08/16 0415 06/09/16 0415  NA 138 143  K 3.8 4.1  CL 101 104  CO2 32 34*  GLUCOSE 151* 113*  BUN 30* 32*  CREATININE 0.68 0.77  CALCIUM 8.0* 8.5*  MG 2.4  --    Estimated Creatinine Clearance: 57.2 mL/min (by C-G formula based on SCr of 0.77 mg/dL).    Recent Labs  06/09/16 2333 06/10/16 0353 06/10/16 0754  GLUCAP 162* 136* 122*    Medications:  Scheduled:  . antiseptic oral rinse  7 mL Mouth Rinse q12n4p  . cefTRIAXone (ROCEPHIN)  IV  2 g Intravenous Q24H  . chlorhexidine  15 mL Mouth Rinse BID  . insulin aspart  0-15 Units Subcutaneous Q4H  . ondansetron (ZOFRAN) IV  4 mg Intravenous Q8H  . phenol  1 spray Mouth/Throat BID  . sodium chloride flush  10-40 mL Intracatheter Q12H  . sodium chloride flush  3 mL Intravenous Q12H    Insulin Requirements in the past 24 hours:  15units moderate SSI + 40 units regular insulin in TPN  Assessment: 47 YOF recently admitted to [facility] with gastric outlet obstruction due to necrotic esophagitis and discharged on home Unasyn and TPN to [facility]. Admitted  to [facility] 05/04/2016 with shortness of breath, fever, and confusion. PICC line was removed on admission due to concern for line infection - found to have fungemia. She is to continue nutrition support with TPN.  GI: Pepcid in TPN. IR unable to place postpyloric tube d/t obstruction. Bx suspicious for adenocarcinoma with plan for resection and pyloroplasty vs GJ once nutritionalstatus improves, but may pursue palliative approach.  GOC- considering a palliative vented GT to allow pt to drink.  (+)emesis this AM.  Prealbumin 3.5 >6.1, slow rise d/t inflammation.  Zofran q8 d/t increased emesis. Endo: hx DM - CBGs have been controlled but she required inc SSI yest compared to last several days; on TPN- 122-183 & off TPN 141.  CBGs have been quite variable and required frequent insulin adjustment in TPN(insulin in TPN range: 20-50 units).  Watch Lytes: hyponatremia prior to TPN initiation. Na added to TPN 9/10 per [facility] request - Na 144. K+ 4.1, others WNL.  No labs today Renal: SCr stable, BUN 31, net+16L since admit. UOP 0.21m/kg/hr Cards: BP soft-high and hr wnl-tachy- Lasix d/t hypervolemia- stopped 9/20. Hepatobil: alk phos elevated, AST mildly elevated, tbili / TG WNL. Expect LFTs to rise given no gut stimulation. Pulm: up to [place]-3L, sats 90-100s ID: [id number]>CTXfor Kleb bacteremia. S/p 14d [name] for C.glabrata bacteremia - WBC trending down (9/20).  Tm 101.7, currently AFeb. Neuro:  Rapid response 9/22 afternoon d/t  change in LOC after Promethazine  Best Practices: SCDs TPN Access: PICC replaced 05/24/16 TPN start date: on TPN PTA (since 04/19/16 per Surgery)  TPN at [facility]: (information obtained on 8/30 from [facility] - pharmacist at [place]) Clinimix E 5/15 1440 ml + 20% lipids 192 ml 35 units of regular insulin ordered for each bag - per discussion there is question if this was actually being added to each bag at the facility  Current Nutrition: TPN  Nutritional  Goals: 1500-1700 kCal and 70-80 grams of protein per day  Plan: - Continue cyclic Clinimix E 4/07, infuse 1559m over 18 hrs: 50 ml/hr x 1 hr, then 91 ml/hr x 16 hrs, then 50 ml/hr x 1 hr - Lipids 13 ml/hr x 18 hrs - TPN provides approximately 1588 kCal and 78 gm of protein per day, meeting 100% of patient's needs. - Daily multivitamin and trace elementsin TPN - Continue moderateSSI Q4H + 40 units of regular insulin in TPN - Pepcid 467min TPN - Continue to add Na 6636mNaCL to make Clinimix 1/2NS concentration (sodium content 18m54m) - F/U volume status and CBGs. Consider another trial of cyclicTPN over 16 hours. - TPN labs qMon/Thurs   KendGracy BruinsarmD Clinical Pharmacist [facility]

## 2016-06-10 NOTE — Progress Notes (Signed)
Pt vomited 300 cc of brown emesis, Dr. Wendee Beavers notified at [phone]. Pt resting at this time.

## 2016-06-10 NOTE — Clinical Social Work Note (Signed)
No change in DC plan at this time. Awaiting stability per MD for return to [facility]  CSW services will continue to monitor and provide support as needed.  Lorie Phenix. Pauline Good, [place] (weekend coverage)

## 2016-06-10 NOTE — Progress Notes (Signed)
Patient ID: [name] [name], female   DOB: Mar 22, 1935, 81 y.o.   MRN: [id number]                                                                PROGRESS NOTE                                                                                                                                                                                                             Patient Demographics:    [name] [name], is a 81 y.o. female, DOB - [birth year], [id number]  Admit date - 04/18/2016   Admitting Physician Waldemar Dickens, MD  Outpatient Primary MD for the patient is [name], MD  LOS - 26  Outpatient Specialists:  Chief Complaint  Patient presents with  . Code Sepsis       Brief Narrative  81 y.o.WF PMHx degenerative disc disease, DM Type 2 controlled with complications, Diverticulosis, GERD, Hiatal Hernia, HLD, HTN, and Chronic Hyponatremia    who was admitted 04/14/2016 >04/27/2016 for treatment of acute GI bleed with gastric outlet obstruction due to necrotic esophagitis with superficial fungal infection and ulceration with subsequent development of possible right-sided pneumonitis and aspiration pneumonia. Patient was discharged on Unasyn and Diflucan and completed her course on 05/14/2016. A Left arm PICC was placedfor home Unasyn and TPN. She returned to the ED c/o shortness of breath and fevers. In the ED she was noted to be confused.     Subjective:    Herb Grays today has, still some nausea, and vomiting yesterday.  Denies fever, chills, abd pain, diarrhea, brbpr, black stool.   No headache, No chest pain,  No Nausea, No new weakness tingling or numbness, No Cough - SOB.    Assessment  & Plan :    Principal Problem:   Fungemia Active Problems:   DM2 (diabetes mellitus, type 2) ([place])   Gastric outlet obstruction   Esophageal necrosis   Sepsis ([place])   Acute respiratory failure ([place])   Acute encephalopathy   On total parenteral nutrition (TPN)  Normocytic anemia   Essential hypertension   Erosive esophagitis   Candida glabrata infection   Gastric mass   Gastric adenocarcinoma ([facility])   Escherichia coli infection   Bacteremia due to Klebsiella pneumoniae   Acute respiratory failure  with hypoxia ([place])   Disorientation   Hyponatremia   Controlled type 2 diabetes mellitus without complication ([place])   Palliative care encounter  Acute GI bleed / Esophageal necrosis / Gastric outlet obstruction with bowel ulceration -EGD: Gastric tumor, esophagitis  -8/31 biopsy: See results below   Gastric tumor Stomach/pyloric channel mass- SUSPICIOUS FOR ADENOCARCINOMA -8/31 biopsy tumor -CCS recommendations: Not surgical candidate due to nutritional status -After speaking with [facility] today another EGD would not change plan. Patient needs resection of affected area. -Continue TPN. NG tube NOT PLACED Postpyloric by IR  -9/21 per PA Hilbert Odor [facility]CS and currently not candidate for surgical intervention. Unless nutritional status significantly improves. Will continue to follow along Oncology consulted , appreciate input.   Nausea and vomitting Cont zofran Cont pepcid in tpn  Sepsis unspecified organism /Fungemia (positive Candida glabrata) -still febrile - Completed 14 day course of Anidulafungin per ID -remains onTPN, pre-albumin 6  positive Escherichia coli UTI/Klebsiella+ Enterobacteriaceae species Bacteremia - Last 24 hours afebrile  - cont rocephin rather than switch to keflex due to continued GI upset  Acute hypoxic respiratory failure / AMS -Resolved  Chronic Hyponatremia -resolved  Hypokalemia -Resolved  Hypomagnesemia - resolved after replacement.  Normocytic anemia(Baseline Hg ~9.0)  HTN -Hydralazine PRN -Echocardiogram: Essentially normal see results below   DM type 2 controlled without complication -[id number] Hemoglobin A1c = 6.6 -Moderate SSI   DVT prophylaxis: SCD Code Status: DO NOT  RESUSCITATE Family Communication: None Disposition Plan: Awaiting discussion with palliative care team.    Consultants:  Dr.Kavitha V [facility] Dr. Ralene Ok [facility]  Procedures/Significant Events:  8/31 EGD:- Severe reflux esophagitis. Biopsied.-Gastric tumor at the pylorus and in the prepyloric region of the stomach. Biopsied.- Gastric outlet obstruction 8/31 Biopsy Stomach/pyloric channel mass- SUSPICIOUS FOR ADENOCARCINOMA. - Esophagus, biopsy:- EXUDATE WITH FOCAL NECROSIS C/W ULCER. Negative malignancy 9/1 Echocardiogram:- Left ventricle: mild focalbasal hypertrophy of the septum. -LVEF =60% to 65%. 9/5 placement postpyloric NG tube    Cultures 8/29 blood left hand /right AC positive Candida glabrata 8/29 urine negative 8/29 MRSA by PCR negative 8/31 blood left/right hand negative Final 9/18 blood positive Klebsiella pneumoniae/Enterobacteriaceae species/Candida glabrata 9/18 blood left arm 2 NGTD 9/19 urine positive Escherichia coli   Antimicrobials: Anidulafungin 8/31>>9/13 Ceftriaxone 9/19>>  Devices  LINES / TUBES:  PICC line 9/7>>    Lab Results  Component Value Date   PLT 111 (L) 06/06/2016      Anti-infectives    Start     Dose/Rate Route Frequency Ordered Stop   06/05/16 1030  cefTRIAXone (ROCEPHIN) 2 g in dextrose 5 % 50 mL IVPB     2 g 100 mL/hr over 30 Minutes Intravenous Every 24 hours 06/05/16 1005     06/04/16 1400  piperacillin-tazobactam (ZOSYN) IVPB 3.375 g  Status:  Discontinued     3.375 g 12.5 mL/hr over 240 Minutes Intravenous Every 8 hours 06/04/16 1235 06/05/16 1005   05/18/16 1700  anidulafungin ([name]) 100 mg in sodium chloride 0.9 % 100 mL IVPB     100 mg over 90 Minutes Intravenous Every 24 hours 05/11/2016 1627 05/30/16 1858   04/29/2016 1700  anidulafungin ([name]) 200 mg in sodium chloride 0.9 % 200 mL IVPB     200 mg over 180 Minutes Intravenous  Once 04/17/2016 1627 04/23/2016 2003   05/16/16 0200   vancomycin (VANCOCIN) IVPB 750 mg/150 ml premix  Status:  Discontinued     750 mg 150 mL/hr over 60 Minutes Intravenous Every 12 hours  04/21/2016 1409 05/16/16 1045   05/16/2016 1400  fluconazole (DIFLUCAN) IVPB 100 mg  Status:  Discontinued     100 mg 50 mL/hr over 60 Minutes Intravenous Every 24 hours 05/13/2016 1317 04/23/2016 1653   05/16/2016 1400  vancomycin (VANCOCIN) IVPB 1000 mg/200 mL premix  Status:  Discontinued     1,000 mg 200 mL/hr over 60 Minutes Intravenous  Once 05/07/2016 1353 05/17/2016 1407   04/21/2016 1330  vancomycin (VANCOCIN) IVPB 1000 mg/200 mL premix     1,000 mg 200 mL/hr over 60 Minutes Intravenous  Once 05/14/2016 1325 05/08/2016 1525   05/03/2016 1300  ceFEPIme (MAXIPIME) 2 g in dextrose 5 % 50 mL IVPB  Status:  Discontinued     2 g 100 mL/hr over 30 Minutes Intravenous Every 12 hours 04/22/2016 1257 05/16/16 1043        Objective:   Vitals:   06/09/16 1344 06/09/16 1411 06/09/16 2017 06/10/16 0500  BP: (!) 121/51  (!) 133/51 (!) 142/52  Pulse: 93  87 86  Resp: 18  18 18   Temp: 97.9 F (36.6 C) 98.7 F (37.1 C) 97.8 F (36.6 C) 97.7 F (36.5 C)  TempSrc: Oral Axillary Oral Oral  SpO2: 92%  96% 96%  Weight:    81.2 kg (179 lb 0.2 oz)  Height:        Wt Readings from Last 3 Encounters:  06/10/16 81.2 kg (179 lb 0.2 oz)  04/27/16 81 kg (178 lb 9.2 oz)  01/20/16 78.5 kg (173 lb)     Intake/Output Summary (Last 24 hours) at 06/10/16 [id number] Last data filed at 06/10/16 [id number]  Gross per 24 hour  Intake          1402.51 ml  Output             1175 ml  Net           227.51 ml     Physical Exam  Awake Alert, Oriented X 3, No new F.N deficits, Normal affect [place].AT,PERRAL Supple Neck,No JVD, No cervical lymphadenopathy appriciated.  Symmetrical Chest wall movement, Good air movement bilaterally, CTAB RRR,No Gallops,Rubs or new Murmurs, No Parasternal Heave +ve B.Sounds, Abd Soft, No tenderness, No organomegaly appriciated, No rebound - guarding or rigidity. No  Cyanosis, Clubbing or edema, No new Rash or bruise   PICC line in place (right antecub) Foley in place    Data Review:    CBC  Recent Labs Lab 06/04/16 1250 06/06/16 0520  WBC 8.1 7.4  HGB 8.5* 8.9*  HCT 27.5* 28.5*  PLT 73* 111*  MCV 83.1 82.4  MCH 25.7* 25.7*  MCHC 30.9 31.2  RDW 15.1 15.0  LYMPHSABS 1.2  --   MONOABS 0.7  --   EOSABS 0.0  --   BASOSABS 0.0  --     Chemistries   Recent Labs Lab 06/03/16 1106 06/04/16 0500 06/06/16 0520 06/07/16 0500 06/08/16 0415 06/09/16 0415  NA  --  141 141 141 138 143  K  --  3.7 2.8* 3.5 3.8 4.1  CL  --  106 103 104 101 104  CO2  --  25 29 31  32 34*  GLUCOSE  --  286* 124* 133* 151* 113*  BUN  --  28* 31* 33* 30* 32*  CREATININE  --  0.83 0.85 0.78 0.68 0.77  CALCIUM  --  8.4* 8.0* 7.9* 8.0* 8.5*  MG 1.5* 1.7  --  1.6* 2.4  --   AST  --  58*  --  51*  --   --   ALT  --  53  --  36  --   --   ALKPHOS  --  212*  --  146*  --   --   BILITOT  --  0.4  --  0.4  --   --    ------------------------------------------------------------------------------------------------------------------ No results for input(s): CHOL, HDL, LDLCALC, TRIG, CHOLHDL, LDLDIRECT in the last 72 hours.  Lab Results  Component Value Date   HGBA1C 6.6 (H) 04/15/2016   ------------------------------------------------------------------------------------------------------------------ No results for input(s): TSH, T4TOTAL, T3FREE, THYROIDAB in the last 72 hours.  Invalid input(s): FREET3 ------------------------------------------------------------------------------------------------------------------ No results for input(s): VITAMINB12, FOLATE, FERRITIN, TIBC, IRON, RETICCTPCT in the last 72 hours.  Coagulation profile No results for input(s): INR, PROTIME in the last 168 hours.  No results for input(s): DDIMER in the last 72 hours.  Cardiac Enzymes No results for input(s): CKMB, TROPONINI, MYOGLOBIN in the last 168 hours.  Invalid input(s):  CK ------------------------------------------------------------------------------------------------------------------ No results found for: BNP  Inpatient Medications  Scheduled Meds: . antiseptic oral rinse  7 mL Mouth Rinse q12n4p  . cefTRIAXone (ROCEPHIN)  IV  2 g Intravenous Q24H  . chlorhexidine  15 mL Mouth Rinse BID  . insulin aspart  0-15 Units Subcutaneous Q4H  . ondansetron (ZOFRAN) IV  4 mg Intravenous Q8H  . phenol  1 spray Mouth/Throat BID  . sodium chloride flush  10-40 mL Intracatheter Q12H  . sodium chloride flush  3 mL Intravenous Q12H   Continuous Infusions: . sodium chloride 10 mL/hr at 06/08/16 1535  . Marland KitchenTPN (CLINIMIX-E) Adult 91 mL/hr at 06/09/16 2255   And  . fat emulsion 234 mL (06/09/16 1809)   PRN Meds:.acetaminophen, hydrALAZINE, ipratropium-albuterol, menthol-cetylpyridinium, morphine injection, ondansetron, phenol, sodium chloride flush, sodium chloride flush  Micro Results Recent Results (from the past 240 hour(s))  Culture, blood (routine x 2)     Status: Abnormal   Collection Time: 06/04/16 10:10 AM  Result Value Ref Range Status   Specimen Description BLOOD SOURCE UNKNOWN  Final   Special Requests BOTTLES DRAWN AEROBIC AND ANAEROBIC 5CC  Final   Culture  Setup Time   Final    GRAM NEGATIVE RODS IN BOTH AEROBIC AND ANAEROBIC BOTTLES CRITICAL RESULT CALLED TO, READ BACK BY AND VERIFIED WITH: CARON AMEND,PHARMD @0325  06/05/16 MKELLY    Culture KLEBSIELLA PNEUMONIAE (A)  Final   Report Status 06/07/2016 FINAL  Final   Organism ID, Bacteria KLEBSIELLA PNEUMONIAE  Final      Susceptibility   Klebsiella pneumoniae - MIC*    AMPICILLIN >=32 RESISTANT Resistant     CEFAZOLIN <=4 SENSITIVE Sensitive     CEFEPIME <=1 SENSITIVE Sensitive     CEFTAZIDIME <=1 SENSITIVE Sensitive     CEFTRIAXONE <=1 SENSITIVE Sensitive     CIPROFLOXACIN <=0.25 SENSITIVE Sensitive     GENTAMICIN <=1 SENSITIVE Sensitive     IMIPENEM <=0.25 SENSITIVE Sensitive      TRIMETH/SULFA <=20 SENSITIVE Sensitive     AMPICILLIN/SULBACTAM 16 INTERMEDIATE Intermediate     PIP/TAZO <=4 SENSITIVE Sensitive     Extended ESBL NEGATIVE Sensitive     * KLEBSIELLA PNEUMONIAE  Blood Culture ID Panel (Reflexed)     Status: Abnormal   Collection Time: 06/04/16 10:10 AM  Result Value Ref Range Status   Enterococcus species NOT DETECTED NOT DETECTED Final   Listeria monocytogenes NOT DETECTED NOT DETECTED Final   Staphylococcus species NOT DETECTED NOT DETECTED Final   Staphylococcus aureus NOT DETECTED NOT DETECTED Final  Streptococcus species NOT DETECTED NOT DETECTED Final   Streptococcus agalactiae NOT DETECTED NOT DETECTED Final   Streptococcus pneumoniae NOT DETECTED NOT DETECTED Final   Streptococcus pyogenes NOT DETECTED NOT DETECTED Final   Acinetobacter baumannii NOT DETECTED NOT DETECTED Final   Enterobacteriaceae species DETECTED (A) NOT DETECTED Final    Comment: CRITICAL RESULT CALLED TO, READ BACK BY AND VERIFIED WITH: CARON AMEND,PHARMD @0325  06/05/16 MKELLY    Enterobacter cloacae complex NOT DETECTED NOT DETECTED Final   Escherichia coli NOT DETECTED NOT DETECTED Final   Klebsiella oxytoca NOT DETECTED NOT DETECTED Final   Klebsiella pneumoniae DETECTED (A) NOT DETECTED Final    Comment: CRITICAL RESULT CALLED TO, READ BACK BY AND VERIFIED WITH: CARON AMEND,PHARMD @0325  06/05/16 MKELLY    Proteus species NOT DETECTED NOT DETECTED Final   Serratia marcescens NOT DETECTED NOT DETECTED Final   Carbapenem resistance NOT DETECTED NOT DETECTED Final   Haemophilus influenzae NOT DETECTED NOT DETECTED Final   Neisseria meningitidis NOT DETECTED NOT DETECTED Final   Pseudomonas aeruginosa NOT DETECTED NOT DETECTED Final   Candida albicans NOT DETECTED NOT DETECTED Final   Candida glabrata NOT DETECTED NOT DETECTED Final   Candida krusei NOT DETECTED NOT DETECTED Final   Candida parapsilosis NOT DETECTED NOT DETECTED Final   Candida tropicalis NOT  DETECTED NOT DETECTED Final  Culture, blood (routine x 2)     Status: None   Collection Time: 06/04/16 11:05 AM  Result Value Ref Range Status   Specimen Description BLOOD LEFT ARM  Final   Special Requests BOTTLES DRAWN AEROBIC ONLY 2CC  Final   Culture NO GROWTH 5 DAYS  Final   Report Status 06/09/2016 FINAL  Final  Culture, blood (routine x 2)     Status: None   Collection Time: 06/04/16 11:13 AM  Result Value Ref Range Status   Specimen Description BLOOD LEFT ARM  Final   Special Requests BOTTLES DRAWN AEROBIC ONLY 2CC  Final   Culture NO GROWTH 5 DAYS  Final   Report Status 06/09/2016 FINAL  Final  Culture, Urine     Status: Abnormal   Collection Time: 06/05/16  2:09 PM  Result Value Ref Range Status   Specimen Description URINE, RANDOM  Final   Special Requests NONE  Final   Culture >=100,000 COLONIES/mL ESCHERICHIA COLI (A)  Final   Report Status 06/07/2016 FINAL  Final   Organism ID, Bacteria ESCHERICHIA COLI (A)  Final      Susceptibility   Escherichia coli - MIC*    AMPICILLIN 8 SENSITIVE Sensitive     CEFAZOLIN <=4 SENSITIVE Sensitive     CEFTRIAXONE <=1 SENSITIVE Sensitive     CIPROFLOXACIN <=0.25 SENSITIVE Sensitive     GENTAMICIN <=1 SENSITIVE Sensitive     IMIPENEM <=0.25 SENSITIVE Sensitive     NITROFURANTOIN <=16 SENSITIVE Sensitive     TRIMETH/SULFA <=20 SENSITIVE Sensitive     AMPICILLIN/SULBACTAM 4 SENSITIVE Sensitive     PIP/TAZO <=4 SENSITIVE Sensitive     Extended ESBL NEGATIVE Sensitive     * >=100,000 COLONIES/mL ESCHERICHIA COLI    Radiology Reports Dg Chest 1 View  Result Date: 06/04/2016 CLINICAL DATA:  Fever. EXAM: CHEST 1 VIEW COMPARISON:  06/01/2016 FINDINGS: Interval progression of bilateral airspace disease, right greater than left. The cardio pericardial silhouette is enlarged. Previously seen left pleural effusion seems decreased in the interval. Right PICC line remains in place. Telemetry leads overlie the chest. IMPRESSION: Interval  progression of vascular congestion and bilateral asymmetric airspace  disease. Electronically Signed   By: Misty Stanley M.D.   On: 06/04/2016 12:43   Dg Chest 2 View  Result Date: 06/01/2016 CLINICAL DATA:  Follow-up bilateral pleural effusions. EXAM: CHEST  2 VIEW COMPARISON:  05/30/2016, 05/24/2016 and earlier, including CT chest 04/17/2016. FINDINGS: Suboptimal inspiration. Cardiac silhouette mildly enlarged, unchanged. Interval development of mild diffuse interstitial and airspace pulmonary edema. Bilateral pleural effusions, left greater than right, unchanged. Associated consolidation in the lower lobes, unchanged. Right arm PICC tip projects over the lower SVC, unchanged. IMPRESSION: 1. Interval development of mild CHF and/or fluid overload, with mild interstitial and airspace pulmonary edema. 2. Stable bilateral pleural effusions, left greater than right, and associated passive atelectasis and/or pneumonia in the lower lobes. Electronically Signed   By: Evangeline Dakin M.D.   On: 06/01/2016 13:33   Dg Chest 2 View  Result Date: 05/30/2016 CLINICAL DATA:  Shortness of breath. EXAM: CHEST  2 VIEW COMPARISON:  One-view chest x-ray 05/24/2016 FINDINGS: The heart is enlarged. Atherosclerotic changes are present at the aortic arch. Previously noted lines and tubes have been removed. A right-sided PICC line is in place. The tip is in the distal SVC. New left lower lobe airspace disease is present. Bilateral effusions are noted, left greater than right. There is no significant edema or focal airspace disease otherwise. The visualized soft tissues and bony thorax are otherwise unremarkable. IMPRESSION: 1. Left lower lobe airspace disease is concerning for aspiration or pneumonia. 2. Left greater right pleural effusion. 3. Cardiomegaly without edema. 4. Atherosclerosis of the thoracic aorta. 5. Right-sided PICC line. Electronically Signed   By: San Morelle M.D.   On: 05/30/2016 08:33   Dg Chest Port  1 View  Result Date: 05/24/2016 CLINICAL DATA:  Central line placement.  Initial encounter. EXAM: PORTABLE CHEST 1 VIEW COMPARISON:  Chest radiograph performed 05/12/2016 FINDINGS: Enteric tubes are noted extending below the diaphragm. A left IJ line is noted ending overlying the left brachiocephalic vein. Small bilateral pleural effusions noted, right greater than left. Bibasilar airspace opacities raise concern for pulmonary edema. No pneumothorax is seen. The cardiomediastinal silhouette is mildly enlarged. No acute osseous abnormalities are seen. IMPRESSION: 1. Left IJ line noted ending overlying the left brachiocephalic vein. 2. Small bilateral pleural effusions, right greater than left. Bibasilar airspace opacities raise concern for pulmonary edema. Mild cardiomegaly. Electronically Signed   By: Garald Balding M.D.   On: 05/24/2016 01:54   Dg Chest Port 1 View  Result Date: 05/10/2016 CLINICAL DATA:  Pneumonia. EXAM: PORTABLE CHEST 1 VIEW COMPARISON:  05/16/2016. FINDINGS: Interim placement of NG tube, its tip is below left hemidiaphragm. Left IJ line stable position. Cardiomegaly with diffuse bilateral from interstitial prominence and bilateral pleural effusions consistent congestive heart failure. Slight interim improvement from prior exam. Low lung volumes with bibasilar atelectasis. No pneumothorax. IMPRESSION: 1. Interim placement NG tube. Tip below left hemidiaphragm. Left IJ line stable position. 2. Cardiomegaly with diffuse bilateral from interstitial prominence and bilateral pleural effusions consistent congestive heart failure. Slight interim improvement. 3. Low lung volumes with mild bibasilar atelectasis . Electronically Signed   By: Marcello Moores  Register   On: 04/20/2016 06:36   Dg Chest Port 1 View  Result Date: 05/16/2016 CLINICAL DATA:  81 year old female status post central line placement. EXAM: PORTABLE CHEST 1 VIEW COMPARISON:  Chest x-ray 06/06/2016. FINDINGS: Previously noted left  upper extremity PICC is been removed. New left internal jugular central venous catheter with tip terminating in the mid to distal superior vena  cava. Lung volumes are low. No definite pneumothorax. There is cephalization of the pulmonary vasculature, indistinctness of the interstitial markings, and patchy airspace disease throughout the lungs bilaterally suggestive of moderate pulmonary edema. Small bilateral pleural effusions (left greater than right). Cardiomegaly. Upper mediastinal contours are within normal limits allowing for patient's rotation to the right. Aortic atherosclerosis. IMPRESSION: 1. Support apparatus, as above. 2. No pneumothorax or other complicating features following central line placement. 3. The appearance of the chest suggests developing congestive heart failure, as above. 4. Aortic atherosclerosis. Electronically Signed   By: Vinnie Langton M.D.   On: 05/16/2016 12:26   Dg Chest Port 1 View  Result Date: 05/11/2016 CLINICAL DATA:  Code sepsis EXAM: PORTABLE CHEST 1 VIEW COMPARISON:  04/25/2016 FINDINGS: Mild improvement in aeration in the lung bases. Persistent bibasilar atelectasis/ infiltrate and small pleural effusions. Negative for edema Left arm PICC tip pulled back slightly with the tip in the left innominate/SVC junction. IMPRESSION: Improved aeration in the lung bases. Persistent bibasilar airspace disease and small bilateral effusions. Electronically Signed   By: Franchot Gallo M.D.   On: 04/19/2016 11:12   Dg Addison Bailey G Tube Plc W/fl W/rad  Result Date: 05/22/2016 CLINICAL DATA:  Replacement of NG tube and placement of a feeding tube. EXAM: NASO G TUBE PLACEMENT WITH FL AND WITH RAD CONTRAST:  20 cc Gastrografin FLUOROSCOPY TIME:  Fluoroscopy Time:  20 minutes Radiation Exposure Index (if provided by the fluoroscopic device): 5341.6 mGy Number of Acquired Spot Images: 0 COMPARISON:  CT scan 04/17/2016 FINDINGS: The radiology technologist Baxter Flattery Dingus). Remove the NG tube  which was coronal back on itself in the esophagus and replace and NG tube into the stomach. She also advanced a feeding tube into the stomach but could not advance it into the duodenum. I could not advance it into the duodenum either. IMPRESSION: NG tube and feeding tubes in the stomach. The feeding tube could not be advanced into the duodenum. Electronically Signed   By: Marijo Sanes M.D.   On: 05/22/2016 11:39    Time Spent in minutes  30   Jani Gravel M.D on 06/10/2016 at 8:22 AM  Between 7am to 7pm - Pager - [phone]  After 7pm go to [email or web address] - password [facility]  [facility] -  Office  [phone]

## 2016-06-10 NOTE — Progress Notes (Signed)
Daily Progress Note   Patient Name: [name] [name]       Date: 06/10/2016 DOB: Jul 08, 1935  Age: 81 y.o. MRN#: [id number] Attending Physician: Jani Gravel, MD Primary Care Physician: [name], MD Admit Date: 05/13/2016  Reason for Consultation/Follow-up: Establishing goals of care, Non pain symptom management and Psychosocial/spiritual support  Subjective: Patient has had less vomiting however she does still vomit unprovoked. Some of this decrease in vomiting may be secondary to placement of a Foley catheter which is limiting how much movement she has to go through which has been the precipitating cause as well as scheduled Zofran. Emesis seen in suction container looks brown questionable coffee-ground appearance. Right upper extremity is markedly more swollen than the left. Patient has gone down to ultrasound this morning.  Length of Stay: 26  Current Medications: Scheduled Meds:  . antiseptic oral rinse  7 mL Mouth Rinse q12n4p  . cefTRIAXone (ROCEPHIN)  IV  2 g Intravenous Q24H  . chlorhexidine  15 mL Mouth Rinse BID  . insulin aspart  0-15 Units Subcutaneous Q4H  . ondansetron (ZOFRAN) IV  4 mg Intravenous Q8H  . phenol  1 spray Mouth/Throat BID  . sodium chloride flush  10-40 mL Intracatheter Q12H  . sodium chloride flush  3 mL Intravenous Q12H    Continuous Infusions: . sodium chloride 10 mL/hr at 06/08/16 1535  . Marland KitchenTPN (CLINIMIX-E) Adult 91 mL/hr at 06/09/16 2255   And  . fat emulsion 234 mL (06/09/16 1809)  . Marland KitchenTPN (CLINIMIX-E) Adult     And  . fat emulsion      PRN Meds: acetaminophen, hydrALAZINE, ipratropium-albuterol, menthol-cetylpyridinium, morphine injection, ondansetron, phenol, sodium chloride flush, sodium chloride flush  Physical Exam  Constitutional:  She appears well-developed and well-nourished.  Pale, very weak  HENT:  Head: Atraumatic.  Neck: Normal range of motion.  Cardiovascular: Normal rate.   RUE markedly more swollen than left  Pulmonary/Chest: Effort normal.  Abdominal: She exhibits distension. There is tenderness.  Genitourinary:  Genitourinary Comments: foley  Musculoskeletal:  Very weak. Unable to lift her legs off the bed   Neurological: She is alert.  Oriented x 3 . Somnolent   Skin: Skin is warm and dry.  Psychiatric: Her behavior is normal. Thought content normal.  Nursing note and vitals reviewed.  Vital Signs: BP (!) 142/52 (BP Location: Left Arm)   Pulse 86   Temp 97.7 F (36.5 C) (Oral)   Resp 18   Ht 5\' 2"  (1.575 m)   Wt 81.2 kg (179 lb 0.2 oz)   SpO2 96%   BMI 32.74 kg/m  SpO2: SpO2: 96 % O2 Device: O2 Device: Nasal Cannula O2 Flow Rate: O2 Flow Rate (L/min): 2 L/min  Intake/output summary:  Intake/Output Summary (Last 24 hours) at 06/10/16 1127 Last data filed at 06/10/16 [id number]  Gross per 24 hour  Intake          1402.51 ml  Output             1175 ml  Net           227.51 ml   LBM: Last BM Date: 06/06/16 Baseline Weight: Weight: 77.2 kg (170 lb 3.2 oz) Most recent weight: Weight: 81.2 kg (179 lb 0.2 oz)       Palliative Assessment/Data:    Flowsheet Rows   Flowsheet Row Most Recent Value  Intake Tab  Referral Department  Hospitalist  Unit at Time of Referral  Med/Surg Unit  Palliative Care Primary Diagnosis  Cancer  Palliative Care Type  New Palliative care  Reason for referral  Clarify Goals of Care  Clinical Assessment  Palliative Performance Scale Score  40%  Psychosocial & Spiritual Assessment  Palliative Care Outcomes  Patient/Family meeting held?  Yes [Plan for family meeting with daughter 9/23.]  Who was at the meeting?  patient  Palliative Care Outcomes  Improved non-pain symptom therapy      Patient Active Problem List   Diagnosis Date Noted  .  Palliative care encounter   . Escherichia coli infection   . Bacteremia due to Klebsiella pneumoniae   . Acute respiratory failure with hypoxia ([place])   . Disorientation   . Hyponatremia   . Controlled type 2 diabetes mellitus without complication ([place])   . Gastric mass   . Gastric adenocarcinoma ([place])   . Fungemia 05/01/2016  . Erosive esophagitis   . Candida glabrata infection   . Sepsis ([place]) 05/14/2016  . Acute respiratory failure ([place]) 04/30/2016  . Acute encephalopathy 04/28/2016  . On total parenteral nutrition (TPN) 05/13/2016  . Normocytic anemia 04/26/2016  . Essential hypertension 04/28/2016  . 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 GERD with stricture 04/25/2016  . Upper GI bleed   . Gastric tumor   . Aspiration into airway   . Gastric outlet obstruction   . Melena 04/15/2016  . Fall at home 04/15/2016  . Intramural leiomyoma of uterus 08/25/2015  . Urinary incontinence 06/02/2013  . Nocturia 06/02/2013  . Rectocele 06/02/2013  . Vaginal atrophy 06/02/2013  . Sebaceous cyst of labia 05/01/2013  . DM2 (diabetes mellitus, type 2) ([place])   . Reflux   . Sleep apnea     Palliative Care Assessment & Plan   Patient Profile: 81 y.o.WF PMHx degenerative disc disease, DM Type 2 controlled with complications, Diverticulosis, GERD, Hiatal Hernia, HLD, HTN, and Chronic Hyponatremia,who was admitted 04/14/2016 >04/27/2016 for treatment of acute GI bleed with gastric outlet obstruction due to necrotic esophagitis with superficial fungal infection and ulceration with subsequent development of possible right-sided pneumonitis and aspiration pneumonia. Patient was discharged on Unasyn and Diflucan and completed her course on 05/14/2016. A Left arm PICC was placedfor home Unasyn and TPN. She returned to the ED c/o shortness of breath and fevers. In the ED she was noted to be confused.  She is more alert now. She still is questioning why she cannot have surgery. She continues to have  vomiting without nausea secondary to gastric outlet obstruction. She has been on TPN since 04/18/2016 and has been nothing by mouth since then   Assessment: Patient is alert and oriented 3; she's quite pale and weak. She is struggling with how quickly her health is declined and is still questioning surgery, further workup from oncology. Patient like family is struggling with how rapid decline that she has had  Recommendations/Plan:  Primary attending, Dr. Maudie Mercury, has ordered an oncology consult. I think that's an excellent idea to help this family cope with what they're likely dealing with which is adenocarcinoma. Patient is a poor surgical candidate and her daughter's recognize this but patient is still struggling with "why cannot get better",   Had spoken with surgery about palliative options such as a venting PEG G-tube. We'll hold on this for now until oncology ways in. This additional information will help patients I believe reach further end-of-life decisions such as CODE STATUS. Palliative medicine to stay involved and communicate with family regarding the outcome of this consultation as well as decisions moving forward such as further surgery CODE STATUS symptom management  Nausea: Continue with scheduled Zofran and supportive measures. Patient remains nothing by mouth  Right upper extremity swelling: Doppler results pending  Goals of Care and Additional Recommendations:  Limitations on Scope of Treatment: Full Scope Treatment  Code Status:    Code Status Orders        Start     Ordered   05/31/16 1536  Full code  Continuous     05/31/16 1536    Code Status History    Date Active Date Inactive Code Status Order ID Comments User Context   04/18/2016  4:53 PM 05/31/2016  3:36 PM DNR [id number]  Waldemar Dickens, MD ED   04/15/2016  5:28 PM 04/27/2016  6:08 PM Full Code [id number]  Thurnell Lose, MD Inpatient       Prognosis:   Unable to determine but given the patient's  functional decline, albumin of 1.7, new gastric mass with associated vomiting and gastric outlet obstruction I am concerned that patient could have a prognosis of only weeks. We are awaiting oncology consult for further information which may help [place] in prognostication  Discharge Planning:  To Be Determined. Patient had been living independently at home but she is so debilitated that this point she would need a skilled nursing facility unless she declines further and qualifies for hospice inpatient  Care plan was discussed with Dr. Maudie Mercury  Thank you for allowing the Palliative Medicine Team to assist in the care of this patient.   Time In: 1000 Time Out: 1040 Total Time 40 min Prolonged Time Billed  no       Greater than 50%  of this time was spent counseling and coordinating care related to the above assessment and plan.  Dory Horn, NP  Please contact Palliative Medicine Team phone at [phone] for questions and concerns.

## 2016-06-10 NOTE — Progress Notes (Signed)
Oncology Progress note  Oncology consultation received -- Patient will be seen tomorrow. Case discussed with Dr Maudie Mercury.  Sullivan Lone MD MS

## 2016-06-10 NOTE — Progress Notes (Signed)
*  PRELIMINARY RESULTS* Vascular Ultrasound Right upper extremity venous duplex has been completed.  Preliminary findings: No evidence of DVT or superficial thrombosis.  Landry Mellow, RDMS, RVT  06/10/2016, 10:18 AM

## 2016-06-11 LAB — CBC
HCT: 29.5 % — ABNORMAL LOW (ref 36.0–46.0)
HEMOGLOBIN: 9 g/dL — AB (ref 12.0–15.0)
MCH: 25.9 pg — AB (ref 26.0–34.0)
MCHC: 30.5 g/dL (ref 30.0–36.0)
MCV: 84.8 fL (ref 78.0–100.0)
PLATELETS: 197 10*3/uL (ref 150–400)
RBC: 3.48 MIL/uL — ABNORMAL LOW (ref 3.87–5.11)
RDW: 16.2 % — AB (ref 11.5–15.5)
WBC: 16.4 10*3/uL — ABNORMAL HIGH (ref 4.0–10.5)

## 2016-06-11 LAB — COMPREHENSIVE METABOLIC PANEL
ALT: 27 U/L (ref 14–54)
AST: 36 U/L (ref 15–41)
Albumin: 1.8 g/dL — ABNORMAL LOW (ref 3.5–5.0)
Alkaline Phosphatase: 157 U/L — ABNORMAL HIGH (ref 38–126)
Anion gap: 4 — ABNORMAL LOW (ref 5–15)
BUN: 30 mg/dL — ABNORMAL HIGH (ref 6–20)
CALCIUM: 8.2 mg/dL — AB (ref 8.9–10.3)
CHLORIDE: 103 mmol/L (ref 101–111)
CO2: 31 mmol/L (ref 22–32)
Creatinine, Ser: 0.7 mg/dL (ref 0.44–1.00)
Glucose, Bld: 163 mg/dL — ABNORMAL HIGH (ref 65–99)
Potassium: 3.8 mmol/L (ref 3.5–5.1)
Sodium: 138 mmol/L (ref 135–145)
Total Bilirubin: 0.5 mg/dL (ref 0.3–1.2)
Total Protein: 5.7 g/dL — ABNORMAL LOW (ref 6.5–8.1)

## 2016-06-11 LAB — DIFFERENTIAL
BASOS PCT: 0 %
Basophils Absolute: 0 10*3/uL (ref 0.0–0.1)
EOS ABS: 0.1 10*3/uL (ref 0.0–0.7)
EOS PCT: 0 %
LYMPHS ABS: 1 10*3/uL (ref 0.7–4.0)
Lymphocytes Relative: 6 %
Monocytes Absolute: 0.8 10*3/uL (ref 0.1–1.0)
Monocytes Relative: 5 %
NEUTROS PCT: 89 %
Neutro Abs: 14.5 10*3/uL — ABNORMAL HIGH (ref 1.7–7.7)

## 2016-06-11 LAB — PREALBUMIN: PREALBUMIN: 7.9 mg/dL — AB (ref 18–38)

## 2016-06-11 LAB — TRIGLYCERIDES: Triglycerides: 73 mg/dL (ref <150)

## 2016-06-11 LAB — GLUCOSE, CAPILLARY
GLUCOSE-CAPILLARY: 153 mg/dL — AB (ref 65–99)
GLUCOSE-CAPILLARY: 169 mg/dL — AB (ref 65–99)
GLUCOSE-CAPILLARY: 212 mg/dL — AB (ref 65–99)
Glucose-Capillary: 155 mg/dL — ABNORMAL HIGH (ref 65–99)
Glucose-Capillary: 125 mg/dL — ABNORMAL HIGH (ref 65–99)

## 2016-06-11 LAB — MAGNESIUM: Magnesium: 1.9 mg/dL (ref 1.7–2.4)

## 2016-06-11 LAB — PHOSPHORUS: Phosphorus: 3.4 mg/dL (ref 2.5–4.6)

## 2016-06-11 LAB — LEGIONELLA PNEUMOPHILA SEROGP 1 UR AG: L. pneumophila Serogp 1 Ur Ag: NEGATIVE

## 2016-06-11 MED ORDER — FAT EMULSION 20 % IV EMUL
234.0000 mL | INTRAVENOUS | Status: AC
  Administered 2016-06-11: 234 mL via INTRAVENOUS
  Filled 2016-06-11 (×2): qty 250

## 2016-06-11 MED ORDER — FUROSEMIDE 10 MG/ML IJ SOLN
20.0000 mg | Freq: Every day | INTRAMUSCULAR | Status: DC
  Administered 2016-06-11: 20 mg via INTRAVENOUS
  Filled 2016-06-11: qty 2

## 2016-06-11 MED ORDER — TRACE MINERALS CR-CU-MN-SE-ZN 10-1000-500-60 MCG/ML IV SOLN
INTRAVENOUS | Status: AC
  Administered 2016-06-11: 17:00:00 via INTRAVENOUS
  Filled 2016-06-11 (×2): qty 1556

## 2016-06-11 NOTE — Progress Notes (Addendum)
[facility] Progress Note  Patient: [name] [name] [id number]   PCP: [name], MD DOB: 1934-12-23   DOA: 04/22/2016   DOS: 06/11/2016   Brief hospital course:  30 y/of with type II DM, GERD, Diverticulosis, HTN; admitted initially 04/15/16 with dark stools-work-up showed acute esophageal ncrosis + partial Gastric Outlet obs + pyloric mass She had a rpt EGD on 04/22/16 which confirmed no malignant cellson 05/12/2016,  Re-admitted 05/16/15 sepsis and AMS-Tmax 104.5 Rpt EGD 8/31 showed severe reflux , gastric tumor at pylorus causing gastric outlet obstruction  plan was to have Surgery operate on her when nutritional status better. Path from 8/31 highly suspicious for gastric adeno CA. Also found to have candida fungemia, treated with antifungals Then became Febrile again 9/18 and blood Cx positive for Klebsiella bacteremia and Ecoli UTI,  -Then [facility] on 9/20 recommended Palliative consult Remains on TNA for weeks and prealbumin was 6 from last week and now 7.9 Palliative consulting now and Oncology consulted over the weekend, seen by Dr.Kale 9/26: recommended Best Supportive care vs Hospice [facility] re consulted by Palliative for Venting PEG/G tube -also has third spacing from low albumin and volume from TNA, on IV lasix   Assessment and Plan:  Gastric tumor /pyloric channel mass- SUSPICIOUS FOR ADENOCARCINOMA -[facility] initially recommended gastrojejunal bypass  -Attending MDs d/w GI, s/p multiple EGDs, per Dr.Perry she has obstructing mass at the level of the pylorus, he said that she will need definitive surgery, repeat EGD with biopsies won't change management at this time. -Gastric biopsy: within an area of inflammation and ulceration are atypical features that may represent adenoCa.  To confirm the diagnosis, Dr  Patrick(pathology) feels he needs a deeper biopsy.   GI recommended that surgery to obtain a wedge biopsy of the patient gastric mass. There is no plans for further EGDs. -  Failed attempt at placement for Postpyloric tube by IR due to obstruction -[facility]S was following and was considering bypass surgery when nutritional status improved, then on 9/20 [facility] recommended -Palliative consult due to poor nutritional status and prealbumin remaining low despite TNA for 3weeks -Palliative consulted and [facility] re-consulted to explore option of venting PEG/g-tube for palliative purpose  -Oncology was consulted 9/24 due to family request, seen by Dr.Kale today he recommended Best supportive care through Hospice and called daughter this am.  Sepsis Secondary to Fungemia (positive Candida glabrata) -currently afebrile and responding to therapy -completed 14 days of Anidulafungin per ID notes as of 05/30/16 -continue supportive care  Ecoli UTI and klebsiella bacteremia-9/18 -CXR with CHF/effusions and compressive atelectasis -urine Cx with Ecoli, Blood Cx with Klebsiella-both pansensitive, Klebsiella could be bacterial translocation from tumor site -continue IV ceftraixone for 14days total until 10/3, will repeat Blood Cx  Severe Protein calorie malnutrition. -In the setting of gastric cancer causing prolonged decrease oral intake and gastric obstruction -Currently on TPN per pharmacy -last prealbumin 6->7.9  Pleural effusions/VOlume overload -without any distress at this time, received lasix IV 9/15 -was on daily lasix until 4days ago,  resumed lasix and increased dose to BID -having third spacing from TNA and low albumin  Hypokalemia -continue repletion as needed  -improved  Hypomagnesemia Continue repletion as needed , might need to replace in TPN  Normocytic anemia(Baseline Hg ~9.0) -stable, continue to monitor  HTN -stable, continue Hydralazine PRN -Echocardiogram: EF [id number], diastolic dysfunction  DM type 2 controlled without complication -0000000 Hemoglobin A1c = 6.6 -continue SSI , sugars 110-160 range -TPN with insulin  Full COde  DVT  proph: lovenox No  family at bedside, called and d/w daughter Clint Guy 9/20 Dispo: depending on Palliative consult   Consultants: Gen. surgery, gastroenterology, Palliative medicine and Oncology Procedures: TPN  Antibiotics: Anti-infectives    Start     Dose/Rate Route Frequency Ordered Stop   06/05/16 1030  cefTRIAXone (ROCEPHIN) 2 g in dextrose 5 % 50 mL IVPB     2 g 100 mL/hr over 30 Minutes Intravenous Every 24 hours 06/05/16 1005     06/04/16 1400  piperacillin-tazobactam (ZOSYN) IVPB 3.375 g  Status:  Discontinued     3.375 g 12.5 mL/hr over 240 Minutes Intravenous Every 8 hours 06/04/16 1235 06/05/16 1005   05/18/16 1700  anidulafungin ([name]) 100 mg in sodium chloride 0.9 % 100 mL IVPB     100 mg over 90 Minutes Intravenous Every 24 hours 04/20/2016 1627 05/30/16 1858   05/16/2016 1700  anidulafungin ([name]) 200 mg in sodium chloride 0.9 % 200 mL IVPB     200 mg over 180 Minutes Intravenous  Once 04/30/2016 1627 05/10/2016 2003   05/16/16 0200  vancomycin (VANCOCIN) IVPB 750 mg/150 ml premix  Status:  Discontinued     750 mg 150 mL/hr over 60 Minutes Intravenous Every 12 hours 05/07/2016 1409 05/16/16 1045   05/14/2016 1400  fluconazole (DIFLUCAN) IVPB 100 mg  Status:  Discontinued     100 mg 50 mL/hr over 60 Minutes Intravenous Every 24 hours 05/07/2016 1317 05/07/2016 1653   04/20/2016 1400  vancomycin (VANCOCIN) IVPB 1000 mg/200 mL premix  Status:  Discontinued     1,000 mg 200 mL/hr over 60 Minutes Intravenous  Once 04/22/2016 1353 04/20/2016 1407   05/16/2016 1330  vancomycin (VANCOCIN) IVPB 1000 mg/200 mL premix     1,000 mg 200 mL/hr over 60 Minutes Intravenous  Once 05/10/2016 1325 04/25/2016 1525   04/30/2016 1300  ceFEPIme (MAXIPIME) 2 g in dextrose 5 % 50 mL IVPB  Status:  Discontinued     2 g 100 mL/hr over 30 Minutes Intravenous Every 12 hours 05/05/2016 1257 05/16/16 1043        Intake/Output Summary (Last 24 hours) at 06/11/16 1031 Last data filed at 06/11/16 0939  Gross per 24 hour  Intake           2942.81 ml  Output                0 ml  Net          2942.81 ml   Filed Weights   06/09/16 0554 06/10/16 0500 06/11/16 0555  Weight: 81.2 kg (179 lb 0.2 oz) 81.2 kg (179 lb 0.2 oz) 80.4 kg (177 lb 4 oz)    Subjective: feels ok, no complaints, denies dyspnea  Objective: Physical Exam: Vitals:   06/10/16 0500 06/10/16 1521 06/10/16 2150 06/11/16 0555  BP: (!) 142/52 (!) 142/57 (!) 147/55 (!) 138/54  Pulse: 86 (!) 104 94 (!) 109  Resp: 18 19 20 20   Temp: 97.7 F (36.5 C) 98.7 F (37.1 C) 98 F (36.7 C) 98 F (36.7 C)  TempSrc: Oral Oral    SpO2: 96% 91% 94% (!) 85%  Weight: 81.2 kg (179 lb 0.2 oz)   80.4 kg (177 lb 4 oz)  Height:        General: Alert, Awake and Oriented x2 , debilitated, frail laying in bed Eyes: PERRL, + pallor ENT: Oral Mucosa clear moist; no thrush  Neck: no bruits, no lumps. Unable to properly assess for JVD's due to body  habitus Cardiovascular: S1 and S2 Present, positive systolic Murmur, Respiratory: decreased BS at bases, few scattered ronchi Abdomen: Bowel Sound present, Soft and no tenderness Skin: no redness, no Rash  Extremities: 1plus  edema, no calf tenderness Neurologic: Grossly no focal neuro deficit.   Data Reviewed: CBC:  Recent Labs Lab 06/04/16 1250 06/06/16 0520 06/11/16 0515  WBC 8.1 7.4 16.4*  NEUTROABS 6.2  --  14.5*  HGB 8.5* 8.9* 9.0*  HCT 27.5* 28.5* 29.5*  MCV 83.1 82.4 84.8  PLT 73* 111* [id number]   Basic Metabolic Panel:  Recent Labs Lab 06/06/16 0520 06/07/16 0500 06/08/16 0415 06/09/16 0415 06/11/16 0515  NA 141 141 138 143 138  K 2.8* 3.5 3.8 4.1 3.8  CL 103 104 101 104 103  CO2 29 31 32 34* 31  GLUCOSE 124* 133* 151* 113* 163*  BUN 31* 33* 30* 32* 30*  CREATININE 0.85 0.78 0.68 0.77 0.70  CALCIUM 8.0* 7.9* 8.0* 8.5* 8.2*  MG  --  1.6* 2.4  --  1.9  PHOS  --  3.9  --   --  3.4    Liver Function Tests:  Recent Labs Lab 06/07/16 0500 06/11/16 0515  AST 51* 36  ALT 36 27  ALKPHOS 146* 157*   BILITOT 0.4 0.5  PROT 5.0* 5.7*  ALBUMIN 1.7* 1.8*   CBG:  Recent Labs Lab 06/10/16 1723 06/10/16 2004 06/10/16 2356 06/11/16 0359 06/11/16 0810  GLUCAP 169* 166* 147* 153* 169*    Studies: No results found.   Scheduled Meds: . antiseptic oral rinse  7 mL Mouth Rinse q12n4p  . cefTRIAXone (ROCEPHIN)  IV  2 g Intravenous Q24H  . chlorhexidine  15 mL Mouth Rinse BID  . furosemide  20 mg Intravenous Daily  . insulin aspart  0-15 Units Subcutaneous Q4H  . ondansetron (ZOFRAN) IV  4 mg Intravenous Q8H  . phenol  1 spray Mouth/Throat BID  . sodium chloride flush  10-40 mL Intracatheter Q12H  . sodium chloride flush  3 mL Intravenous Q12H   Continuous Infusions: . sodium chloride 10 mL/hr at 06/10/16 1008  . Marland KitchenTPN (CLINIMIX-E) Adult     And  . fat emulsion 234 mL (06/10/16 1839)  . Marland KitchenTPN (CLINIMIX-E) Adult     And  . fat emulsion     PRN Meds: acetaminophen, hydrALAZINE, ipratropium-albuterol, menthol-cetylpyridinium, morphine injection, ondansetron, phenol, sodium chloride flush, sodium chloride flush  Time spent: 30 minutes  Domenic Polite, MD [facility] (P) [phone]  If 7PM-7AM, please contact night-coverage at [email or web address], password [facility]

## 2016-06-11 NOTE — Progress Notes (Signed)
[place] NOTE  Pharmacy Consult for TPN Indication: Gastric outlet obstruction  Allergies  Allergen Reactions  . Ace Inhibitors Cough  . Prednisone Other (See Comments)    LETHARGY  . Sitagliptin Other (See Comments)    Noted on [facility]    Patient Measurements: Height: 5' 2" (157.5 cm) Weight: 177 lb 4 oz (80.4 kg) IBW/kg (Calculated) : 50.1 Adjusted Body Weight: 75 Usual Weight: 80  Vital Signs: Temp: 98 F (36.7 C) (09/25 0555) BP: 138/54 (09/25 0555) Pulse Rate: 109 (09/25 0555) Intake/Output from previous day: 09/24 0701 - 09/25 0700 In: 2942.8 [I.V.:2892.8; IV Piggyback:50] Out: -  Intake/Output from this shift: No intake/output data recorded.  Labs:  Recent Labs  06/11/16 0515  WBC 16.4*  HGB 9.0*  HCT 29.5*  PLT 197     Recent Labs  06/09/16 0415 06/11/16 0515 06/11/16 0516  NA 143 138  --   K 4.1 3.8  --   CL 104 103  --   CO2 34* 31  --   GLUCOSE 113* 163*  --   BUN 32* 30*  --   CREATININE 0.77 0.70  --   CALCIUM 8.5* 8.2*  --   MG  --  1.9  --   PHOS  --  3.4  --   PROT  --  5.7*  --   ALBUMIN  --  1.8*  --   AST  --  36  --   ALT  --  27  --   ALKPHOS  --  157*  --   BILITOT  --  0.5  --   PREALBUMIN  --  7.9*  --   TRIG  --   --  73   Estimated Creatinine Clearance: 56.9 mL/min (by C-G formula based on SCr of 0.7 mg/dL).    Recent Labs  06/10/16 2004 06/10/16 2356 06/11/16 0359  GLUCAP 166* 147* 153*    Medications:  Scheduled:  . antiseptic oral rinse  7 mL Mouth Rinse q12n4p  . cefTRIAXone (ROCEPHIN)  IV  2 g Intravenous Q24H  . chlorhexidine  15 mL Mouth Rinse BID  . furosemide  20 mg Intravenous Daily  . insulin aspart  0-15 Units Subcutaneous Q4H  . ondansetron (ZOFRAN) IV  4 mg Intravenous Q8H  . phenol  1 spray Mouth/Throat BID  . sodium chloride flush  10-40 mL Intracatheter Q12H  . sodium chloride flush  3 mL Intravenous Q12H    Insulin Requirements in the past 24 hours:  13units  moderate SSI + 40 units regular insulin in TPN  Assessment: 5 YOF recently admitted to [facility] with gastric outlet obstruction due to necrotic esophagitis and discharged on home Unasyn and TPN to [facility]. Admitted to [facility] 04/25/2016 with shortness of breath, fever, and confusion. PICC line was removed on admission due to concern for line infection - found to have fungemia. She is to continue nutrition support with TPN.  GI: Pepcid in TPN. IR unable to place postpyloric tube d/t obstruction. Biopsy suspicious for adenocarcinoma with plan for resection and pyloroplasty vs GJ once nutritionalstatus improves, but may end up pursuing palliative approach.  GOC- considering a palliative vented GT to allow pt to drink.  (+) uncontrolled emesis .  Prealbumin 3.5 >7.9, slow rise d/t inflammation.  Zofran q8 d/t increased emesis. Endo: hx DM - cbgs currently controlled but they have been quite variable and required frequent insulin adjustment in TPN. Lytes: hyponatremia prior to TPN initiation. Na added  to TPN since 9/10 per MD request - Na 144, CoCa 9.9, Mg 1.9, Ca x Phos = 34. Other lytes WNL.  Renal: SCr stable, BUN 30, net+12L since admit. UOP 0.5 ml/kg/hr Cards: BP soft-high and hr wnl-tachy- Lasix Hepatobil: alk phos elevated, LFTs/Tbli wnl, TG 73  Pulm: up to [place]-3L, sats 90-100s ID: Ceftriaxone (day#8 of abx) for Kleb bacteremia. S/p 14d [name] for C.glabrata bacteremia - WBC trending down (9/20).  Tm 101.7, currently AFeb. Neuro:  Rapid response 9/22 afternoon d/t change in LOC after Promethazine  Best Practices: SCDs TPN Access: PICC replaced 05/24/16 TPN start date: on TPN PTA (since 04/19/16)  TPN at [facility]: (information obtained on 8/30 from [facility] - pharmacist at [place]) Clinimix E 5/15 1440 ml + 20% lipids 192 ml 35 units of regular insulin ordered for each bag - per discussion there is question if this was actually being added to each bag at the  facility  Current Nutrition: TPN  Nutritional Goals: 1500-1700 kCal/day 70-80 g protein per day Per RD 9/20  Plan: - Continue cyclic Clinimix E 7/70, infuse 1555m over 18 hrs: 50 ml/hr x 1 hr, then 91 ml/hr x 16 hrs, then 50 ml/hr x 1 hr - Lipids 13 ml/hr x 18 hrs - TPN provides approximately 1588 kCal and 78 gm of protein per day, meeting 100% of patient's needs. - Daily multivitamin and trace elementsin TPN - Continue moderateSSI Q4H + 40 units of regular insulin in TPN - Pepcid 473min TPN - Continue to add Na 6621mNaCL to make Clinimix 1/2NS concentration (sodium content 38m39m) - Consider another trial of cyclicTPN over 16 hours -- will hold for now due to emesis - Monitor cbgs - TPN labs qMon/Thurs     AlisHughes BetterarmD, BCPS Clinical Pharmacist 06/11/2016 8:10 AM

## 2016-06-11 NOTE — Care Management Important Message (Signed)
Important Message  Patient Details  Name: [name] [name] MRN: [id number] Date of Birth: 04/19/1935   Medicare Important Message Given:  Yes    Latroya Ng Abena 06/11/2016, 11:25 AM

## 2016-06-12 DIAGNOSIS — I10 Essential (primary) hypertension: Secondary | ICD-10-CM

## 2016-06-12 DIAGNOSIS — B9689 Other specified bacterial agents as the cause of diseases classified elsewhere: Secondary | ICD-10-CM

## 2016-06-12 DIAGNOSIS — Z7189 Other specified counseling: Secondary | ICD-10-CM

## 2016-06-12 DIAGNOSIS — E785 Hyperlipidemia, unspecified: Secondary | ICD-10-CM

## 2016-06-12 LAB — GLUCOSE, CAPILLARY
GLUCOSE-CAPILLARY: 155 mg/dL — AB (ref 65–99)
GLUCOSE-CAPILLARY: 131 mg/dL — AB (ref 65–99)
GLUCOSE-CAPILLARY: 179 mg/dL — AB (ref 65–99)
Glucose-Capillary: 167 mg/dL — ABNORMAL HIGH (ref 65–99)
Glucose-Capillary: 186 mg/dL — ABNORMAL HIGH (ref 65–99)
Glucose-Capillary: 187 mg/dL — ABNORMAL HIGH (ref 65–99)

## 2016-06-12 LAB — PROTIME-INR
INR: 1.38
PROTHROMBIN TIME: 17.1 s — AB (ref 11.4–15.2)

## 2016-06-12 LAB — APTT: APTT: 39 s — AB (ref 24–36)

## 2016-06-12 MED ORDER — TRACE MINERALS CR-CU-MN-SE-ZN 10-1000-500-60 MCG/ML IV SOLN
INTRAVENOUS | Status: DC
  Administered 2016-06-12: 17:00:00 via INTRAVENOUS
  Filled 2016-06-12 (×2): qty 1556

## 2016-06-12 MED ORDER — FUROSEMIDE 10 MG/ML IJ SOLN
20.0000 mg | Freq: Two times a day (BID) | INTRAMUSCULAR | Status: DC
  Administered 2016-06-12 (×2): 20 mg via INTRAVENOUS
  Filled 2016-06-12 (×2): qty 2

## 2016-06-12 MED ORDER — MORPHINE SULFATE (PF) 2 MG/ML IV SOLN
1.0000 mg | INTRAVENOUS | Status: DC | PRN
Start: 2016-06-12 — End: 2016-06-13
  Administered 2016-06-12 – 2016-06-13 (×3): 2 mg via INTRAVENOUS
  Filled 2016-06-12 (×3): qty 1

## 2016-06-12 MED ORDER — LORAZEPAM 2 MG/ML IJ SOLN: 0.5000 mg | Freq: Four times a day (QID) | INTRAMUSCULAR | Status: DC | PRN

## 2016-06-12 MED ORDER — ENOXAPARIN SODIUM 40 MG/0.4ML ~~LOC~~ SOLN
40.0000 mg | SUBCUTANEOUS | Status: DC
  Administered 2016-06-12: 40 mg via SUBCUTANEOUS
  Filled 2016-06-12: qty 0.4

## 2016-06-12 MED ORDER — FAT EMULSION 20 % IV EMUL
234.0000 mL | INTRAVENOUS | Status: DC
  Administered 2016-06-12: 234 mL via INTRAVENOUS
  Filled 2016-06-12 (×2): qty 250

## 2016-06-12 NOTE — Progress Notes (Signed)
OT Cancellation Note  Patient Details Name: [name] [name] MRN: [id number] DOB: [birth year]   Cancelled Treatment:    Reason Eval/Treat Not Completed: Patient at procedure or test/ unavailable  Britt Bottom 06/12/2016, 1:02 PM

## 2016-06-12 NOTE — Progress Notes (Signed)
Nutrition Follow-up  DOCUMENTATION CODES:   Obesity unspecified  INTERVENTION:   -TPN management per pharmacy  NUTRITION DIAGNOSIS:   Inadequate oral intake related to inability to eat as evidenced by NPO status.  Ongoing  GOAL:   Patient will meet greater than or equal to 90% of their needs  Met with TPN  MONITOR:   Diet advancement, Labs, Weight trends, Skin, I & O's  REASON FOR ASSESSMENT:   Consult New TPN/TNA  ASSESSMENT:    81 y.o. Female wiht PMH of degenerative disc disease, diabetes, diverticulosis, GERD, hiatal hernia, HLD, HTN, chronic hyponatremia recently admitted from 04/14/2016 until 04/27/2016 for treatment of acute GI bleed with gastric outlet obstruction, necrotic esophagitis with superficial fungal infection and ulceration with subsequent development of possible right-sided pneumonitis and aspiration pneumonia. Patient was discharged on Unasyn and completed her course on 05/14/2016.[name] Patient with left arm PICC in place since time of discharge for Unasyn and TPN administration. Patient's only complaint at this point time is shortness of breath and fevers. Denies worsening lower extremity edema, chest pain, palpitations, dysuria. Patient unable to provide further history.  TF was d/c on 05/25/16 and feeding tube removed due to inability to place tube past obstruction. Pt remains NPO, on TPN.   Reviewed pharmacy note; pt transitioned to cyclic TPN on 12/07/53. Plan to continue cyclic TPN E 7/32, infuse 1550m over 18 hrs: 50 ml/hr x 1 hr, then 91 ml/hr x 16 hrs, then 50 ml/hr x 1 hr(will continue 18 hour cycle today and watch CBGs closely). Lipids 13 ml/hr x 18 hrs. TPN provides approximately 1588 kcalsand 78 gramsof protein per day, meeting 100% of patient's needs.  Surgical service and palliative care team following. Pt and family opting for palliative approach. Per palliative note, pt with poor prognosis and will likely require hospice at discharge. Plan  for venting g-tube tomorrow (910/01/2016 per surgical service.   Reviewed CS[facility] note. [facility]   Labs reviewed: CBGS: 155-167.   Diet Order:  Diet NPO time specified ADULT TPN TPN (CLINIMIX-E) Adult TPN (CLINIMIX-E) Adult  Skin:  Reviewed, no issues  Last BM:  06/06/16  Height:   Ht Readings from Last 1 Encounters:  04/28/2016 5' 2" (1.575 m)    Weight:   Wt Readings from Last 1 Encounters:  06/12/16 169 lb 5 oz (76.8 kg)    Ideal Body Weight:  50 kg  BMI:  Body mass index is 30.97 kg/m.  Estimated Nutritional Needs:   Kcal:  1500-1700  Protein:  70-80 gm  Fluid:  1.5-1.7 L  EDUCATION NEEDS:   No education needs identified at this time  Angello Chien A. WJimmye Norman RD, LDN, CDE Pager: [phone] hours Pager: [phone]

## 2016-06-12 NOTE — Progress Notes (Addendum)
No charge note.  Had detailed discussion with Mrs. Norsworthy and her daughter [name].  She understands her cancer is inoperable and that she likely only has weeks to live.  She would like to move forward with a venting gastrostomy tube at surgery's earliest convenience.  She will likely need residential hospice at discharge.  She is now a DNR.  Full note to follow.  Imogene Burn, [facility] Pager: [phone]

## 2016-06-12 NOTE — Progress Notes (Signed)
Physical Therapy Treatment Patient Details Name: [name] [name] MRN: [id number] DOB: 07-26-1935 Today's Date: 06/12/2016    History of Present Illness 81 y.o.femalewith history of degenerative disc disease, DM, diverticulosis, GERD, hiatal hernia, HLD, HTN, and chronic hyponatremia who was admitted 04/14/2016 >04/27/2016 for treatment of acute GI bleed with gastric outlet obstruction due to necrotic esophagitis with superficial fungal infection and ulceration with subsequent development of possible right-sided pneumonitis and aspiration pneumonia. She returned to the ED c/o shortness of breath, fevers, and confusion. +sepsis (?due to PICC)    PT Comments    Patient limited by decreased activity tolerance and weakness. Tolerated OOB transfers this session. Continue to progress as tolerated with anticipated d/c to SNF for further skilled PT services.    Follow Up Recommendations  SNF     Equipment Recommendations  None recommended by PT    Recommendations for Other Services OT consult     Precautions / Restrictions Precautions Precautions: Fall Restrictions Weight Bearing Restrictions: No    Mobility  Bed Mobility Overal bed mobility: Needs Assistance Bed Mobility: Supine to Sit     Supine to sit: Min assist;HOB elevated     General bed mobility comments: assist to elevate trunk into sitting; cues for technique; use of rails and HOB elevated  Transfers Overall transfer level: Needs assistance Equipment used: Rolling walker (2 wheeled);None Transfers: Sit to/from [employer] to Stand: Min assist;+2 physical assistance Stand pivot transfers: Min assist;+2 physical assistance       General transfer comment: sit to stands X2 from EOB and SPT X1; cues for hand placement and sequencing for safe descent to recliner; assist to power up into standing, maintain balance in standing, and to manage RW  Ambulation/Gait                 Stairs             Wheelchair Mobility    Modified Rankin (Stroke Patients Only)       Balance                                    Cognition Arousal/Alertness: Awake/alert Behavior During Therapy: WFL for tasks assessed/performed Overall Cognitive Status: Within Functional Limits for tasks assessed                      Exercises      General Comments General comments (skin integrity, edema, etc.): pt very soft spoken and anxious about OOB mobility      Pertinent Vitals/Pain Pain Assessment: No/denies pain    Home Living                      Prior Function            PT Goals (current goals can now be found in the care plan section) Progress towards PT goals: Progressing toward goals    Frequency    Min 2X/week      PT Plan Current plan remains appropriate    Co-evaluation             End of Session Equipment Utilized During Treatment: Gait belt;Oxygen Activity Tolerance: Patient tolerated treatment well Patient left: in chair;with call bell/phone within reach;with chair alarm set     Time: [id number] PT Time Calculation (min) (ACUTE ONLY): 43 min  Charges:  $Therapeutic Activity: 38-52 mins  G Codes:      Salina April, PTA Pager: [phone]   06/12/2016, 3:50 PM

## 2016-06-12 NOTE — Progress Notes (Signed)
[facility] NOTE  Pharmacy Consult for TPN Indication: Gastric outlet obstruction  Allergies  Allergen Reactions  . Ace Inhibitors Cough  . Prednisone Other (See Comments)    LETHARGY  . Sitagliptin Other (See Comments)    Noted on [facility]    Patient Measurements: Height: _0  (157.5 cm) Weight: 169 lb 5 oz (76.8 kg) IBW/kg (Calculated) : 50.1 Adjusted Body Weight: 75 Usual Weight: 80  Vital Signs: Temp: 98.7 F (37.1 C) (09/26 0506) Temp Source: Oral (09/25 2133) BP: 146/55 (09/26 0506) Pulse Rate: 98 (09/26 0506) Intake/Output from previous day: 09/25 0701 - 09/26 0700 In: 1821.3 [I.V.:1271.3] Out: 2450 [Urine:2450] Intake/Output from this shift: No intake/output data recorded.  Labs:  Recent Labs  06/11/16 0515  WBC 16.4*  HGB 9.0*  HCT 29.5*  PLT 197     Recent Labs  06/11/16 0515 06/11/16 0516  NA 138  --   K 3.8  --   CL 103  --   CO2 31  --   GLUCOSE 163*  --   BUN 30*  --   CREATININE 0.70  --   CALCIUM 8.2*  --   MG 1.9  --   PHOS 3.4  --   PROT 5.7*  --   ALBUMIN 1.8*  --   AST 36  --   ALT 27  --   ALKPHOS 157*  --   BILITOT 0.5  --   PREALBUMIN 7.9*  --   TRIG  --  73   Estimated Creatinine Clearance: 55.6 mL/min (by C-G formula based on SCr of 0.7 mg/dL).    Recent Labs  06/12/16 0046 06/12/16 0401 06/12/16 0821  GLUCAP 187* 186* 167*    Medications:  Scheduled:  . antiseptic oral rinse  7 mL Mouth Rinse q12n4p  . cefTRIAXone (ROCEPHIN)  IV  2 g Intravenous Q24H  . chlorhexidine  15 mL Mouth Rinse BID  . furosemide  20 mg Intravenous Q12H  . insulin aspart  0-15 Units Subcutaneous Q4H  . ondansetron (ZOFRAN) IV  4 mg Intravenous Q8H  . phenol  1 spray Mouth/Throat BID  . sodium chloride flush  10-40 mL Intracatheter Q12H  . sodium chloride flush  3 mL Intravenous Q12H    Insulin Requirements in the past 24 hours:  19units moderate SSI + 40 units regular insulin in TPN  Assessment: 39 YOF  recently admitted to [facility] with gastric outlet obstruction due to necrotic esophagitis and discharged on home Unasyn and TPN to [facility]. Admitted to [facility] 04/18/2016 with shortness of breath, fever, and confusion. PICC line was removed on admission due to concern for line infection - found to have fungemia. She is to continue nutrition support with TPN.  GI: Pepcid in TPN. IR unable to place postpyloric tube d/t obstruction. Biopsy suspicious for adenocarcinoma with plan for resection and pyloroplasty vs GJ once nutritionalstatus improves, but may end up pursuing palliative approach.  GOC- considering a palliative vented GT to allow pt to drink.  (+) uncontrolled emesis .  Prealbumin 3.5 >7.9, slow rise d/t inflammation.  Zofran q8 d/t increased emesis. Endo: hx DM - cbgs 125-212, have been quite variable and required frequent insulin adjustment in TPN. Lytes: hyponatremia prior to TPN initiation. Na added to TPN since 9/10 per MD request - Na 138, CoCa 9.9, Mg 1.9, Ca x Phos = 34. Other lytes WNL.  Renal: SCr stable, BUN 30, net+12L since admit. UOP 0.5 ml/kg/hr Cards: BP soft-high and hr wnl-tachy-  Lasix Hepatobil: alk phos elevated, LFTs/Tbli wnl, TG 73  Pulm: up to [place]-3L, sats 90-100s ID: Ceftriaxone (day#9 of abx) for Kleb bacteremia. S/p 14d [name] for C.glabrata bacteremia - WBC trending down (9/20).  Tm 101.7, currently AFeb. Neuro:  Rapid response 9/22 afternoon d/t change in LOC after Promethazine  Best Practices: SCDs TPN Access: PICC replaced 05/24/16 TPN start date: on TPN PTA (since 04/19/16)  TPN at [facility]: (information obtained on 8/30 from [facility] - pharmacist at [place]) Clinimix E 5/15 1440 ml + 20% lipids 192 ml+ 35 units of regular insulin   Current Nutrition: TPN  Nutritional Goals: 1500-1700 kCal/day 70-80 g protein per day Per RD 9/20  Plan: - Continue cyclic Clinimix E 8/85, infuse 1535m over 18 hrs: 50 ml/hr x 1 hr,  then 91 ml/hr x 16 hrs, then 50 ml/hr x 1 hr - Lipids 13 ml/hr x 18 hrs - TPN provides approximately 1588 kCal and 78 gm of protein per day, meeting 100% of patient's needs. - Daily multivitamin and trace elementsin TPN - Continue moderateSSI Q4H + 40 units of regular insulin in TPN - Pepcid 43min TPN - Continue to add Na 6682mNaCL to make Clinimix 1/2NS concentration (sodium content 92m92m) - Consider another trial of cyclicTPN over 16 hours -- will keep at 18 hours for now due to pt having emesis  - Monitor cbg trend before increasing insulin - TPN labs qMon/Thurs     AlisHughes BetterarmD, BCPS Clinical Pharmacist 06/12/2016 9:10 AM

## 2016-06-12 NOTE — Consult Note (Signed)
[name]    HEMATOLOGY/ONCOLOGY CONSULTATION NOTE  Date of Service: 06/12/2016  Patient Care Team: [name], MD as PCP - General (Family Medicine)  CHIEF COMPLAINTS/PURPOSE OF CONSULTATION:  Likely gastric adenocarcinoma with pyloric outlet obstruction  HISTORY OF PRESENTING ILLNESS:   [name] [name] is a wonderful 81 y.o. female who has been referred to [place] by Dr Domenic Polite, MDfor evaluation and management of likely gastric adenocarcinma with pyloric outlet obstruction.  Patient has history of hypertension, diabetes, dyslipidemia, adenomatous colonic polyps who initially presented on 04/15/2016 with melanotic stools with endoscopy showing acute esophageal necrosis with partial gastric outlet obstruction due to suspected pyloric mass and multiple small bowel ulcerations.  Patient was apparently treated with IV fluids broad-spectrum antibiotics and antifungal and IV PPI. Biopsy of the pylorus and esophagus did not show any overt malignancy at the time. She was started on TPN support. Patient was discharged on 04/27/2016/  She unfortunately got readmitted on 04/28/2016 with sepsis and altered mental status. She was noted to have candidal fungemia. Repeat EGD from 8/71/2017 with biopsy very suspicious for gastric adenocarcinoma. Patient was evaluated by his [facility] [place] [facility] with a plan to consider surgery if the patient was medically stable and had a reasonable nutritional status. Patient's course has also been complicated by Klebsiella bacteremia and Escherichia coli UTI. Her overall medical condition has continued to deteriorate and her nutritional status has not really improved with TPN. She continues to have poor nutritional status and significantly catabolic state with anasarca in the setting of significant hypoalbuminemia. Palliative care has been seeing the patient from 06/06/2016. We were consulted to weigh In on treatment options at this time. GI was reconsulted for better  biopsy since the initial pathology was not definitive. They have decided to hold off on this with an understanding that the patient would likely need surgery for gastric outlet obstruction if she is a candidate anyways.  Patient was seen today and is limited historian. Mouth is quite dry and crusted. Patient reports that her daughter [name] [name] [phone] is her healthcare proxy. She has been essentially bed bound in the last several weeks and has been on TPN support since 04/18/2016. Patient was apparently living independently until earlier this year when she had a fall and had to go to a skilled nursing facility.  Notes no uncontrolled pain at this time. Notes some abdominal distention.  MEDICAL HISTORY:  Past Medical History:  Diagnosis Date  . DDD (degenerative disc disease), lumbar   . Diabetes mellitus   . Diverticulosis   . Endocervical polyp   . Endometrial polyp   . GERD (gastroesophageal reflux disease)   . Hiatal hernia   . Hx of adenomatous colonic polyps   . Hyperlipidemia   . Hypertension   . Hyponatremia     SURGICAL HISTORY: Past Surgical History:  Procedure Laterality Date  . CHOLECYSTECTOMY    . DILATION AND CURETTAGE OF UTERUS    . ESOPHAGOGASTRODUODENOSCOPY (EGD) WITH PROPOFOL N/A 04/17/2016   Procedure: ESOPHAGOGASTRODUODENOSCOPY (EGD) WITH PROPOFOL;  Surgeon: Manus Gunning, MD;  Location: [facility];  Service: Gastroenterology;  Laterality: N/A;  . ESOPHAGOGASTRODUODENOSCOPY (EGD) WITH PROPOFOL N/A 04/30/2016   Procedure: ESOPHAGOGASTRODUODENOSCOPY (EGD) WITH PROPOFOL;  Surgeon: Mauri Pole, MD;  Location: [facility];  Service: Endoscopy;  Laterality: N/A;  . HYSTEROSCOPY    . TONSILLECTOMY      SOCIAL HISTORY: Social History   Social History  . Marital status: Widowed    Spouse name: N/A  . Number of  children: 1  . Years of education: N/A   Occupational History  . retired Retired   Social History Main Topics  . Smoking status: Never  Smoker  . Smokeless tobacco: Never Used  . Alcohol use No  . Drug use: No  . Sexual activity: No   Other Topics Concern  . Not on file   Social History Narrative  . No narrative on file    FAMILY HISTORY: Family History  Problem Relation Age of Onset  . Hypertension Mother   . Stroke Mother   . Diabetes Sister     x 2  . Lung cancer Brother     mets  . Alcohol abuse Father   . Breast cancer Cousin     Mat. 1st cousin-Age 53    ALLERGIES:  is allergic to ace inhibitors; prednisone; and sitagliptin.  MEDICATIONS:  Current Facility-Administered Medications  Medication Dose Route Frequency Provider Last Rate Last Dose  . 0.9 %  sodium chloride infusion   Intravenous Continuous Nita Sells, MD 10 mL/hr at 06/10/16 1008    . acetaminophen (TYLENOL) suppository 650 mg  650 mg Rectal Q4H PRN Cherene Altes, MD   650 mg at 06/09/16 0731  . antiseptic oral rinse (CPC / CETYLPYRIDINIUM CHLORIDE 0.05%) solution 7 mL  7 mL Mouth Rinse q12n4p Waldemar Dickens, MD   7 mL at 06/11/16 1611  . cefTRIAXone (ROCEPHIN) 2 g in dextrose 5 % 50 mL IVPB  2 g Intravenous Q24H Domenic Polite, MD   2 g at 06/11/16 1048  . chlorhexidine (PERIDEX) 0.12 % solution 15 mL  15 mL Mouth Rinse BID Waldemar Dickens, MD   15 mL at 06/11/16 2134  . TPN (CLINIMIX-E) Adult   Intravenous Cyclic-See Admin Instructions Jake Church Masters, RPH 50 mL/hr at 06/11/16 1727     And  . fat emulsion 20 % infusion 234 mL  234 mL Intravenous Continuous TPN Jake Church Masters, RPH 13 mL/hr at 06/11/16 1726 234 mL at 06/11/16 1726  . furosemide (LASIX) injection 20 mg  20 mg Intravenous Daily Domenic Polite, MD   20 mg at 06/11/16 1049  . hydrALAZINE (APRESOLINE) injection 5-10 mg  5-10 mg Intravenous Q4H PRN Waldemar Dickens, MD   10 mg at 05/29/16 0921  . insulin aspart (novoLOG) injection 0-15 Units  0-15 Units Subcutaneous Q4H Norva Riffle, RPH   3 Units at 06/12/16 0506  . ipratropium-albuterol (DUONEB) 0.5-2.5 (3)  MG/3ML nebulizer solution 3 mL  3 mL Nebulization Q6H PRN Barton Dubois, MD      . menthol-cetylpyridinium (CEPACOL) lozenge 3 mg  1 lozenge Oral PRN Lavina Hamman, MD      . morphine 2 MG/ML injection 1 mg  1 mg Intravenous Q3H PRN Dory Horn, NP   1 mg at 06/11/16 2134  . ondansetron (ZOFRAN) injection 4 mg  4 mg Intravenous Q6H PRN Waldemar Dickens, MD   4 mg at 06/11/16 0359  . ondansetron (ZOFRAN) injection 4 mg  4 mg Intravenous Q8H Dory Horn, NP   4 mg at 06/12/16 0506  . phenol (CHLORASEPTIC) mouth spray 1 spray  1 spray Mouth/Throat PRN Cherene Altes, MD   1 spray at 06/01/16 [phone]  . phenol (CHLORASEPTIC) mouth spray 1 spray  1 spray Mouth/Throat BID Lavina Hamman, MD   1 spray at 06/11/16 2134  . sodium chloride flush (NS) 0.9 % injection 10-40 mL  10-40 mL Intracatheter PRN Allie Bossier,  MD   10 mL at 05/30/16 0403  . sodium chloride flush (NS) 0.9 % injection 10-40 mL  10-40 mL Intracatheter Q12H Allie Bossier, MD   10 mL at 06/09/16 0924  . sodium chloride flush (NS) 0.9 % injection 10-40 mL  10-40 mL Intracatheter PRN Allie Bossier, MD   10 mL at 06/11/16 0444  . sodium chloride flush (NS) 0.9 % injection 3 mL  3 mL Intravenous Q12H Waldemar Dickens, MD   3 mL at 06/11/16 2139    REVIEW OF SYSTEMS:    10 Point review of Systems was done is negative except as noted above.  PHYSICAL EXAMINATION: ECOG PERFORMANCE STATUS:3-4 - Bedbound  . Vitals:   06/12/16 0346 06/12/16 0506  BP:  (!) 146/55  Pulse: 100 98  Resp:  18  Temp:  98.7 F (37.1 C)   Filed Weights   06/10/16 0500 06/11/16 0555 06/12/16 0513  Weight: 179 lb 0.2 oz (81.2 kg) 177 lb 4 oz (80.4 kg) 169 lb 5 oz (76.8 kg)   .Body mass index is 30.97 kg/m.  GENERAL:alert, Elderly Caucasian female in no acute distress SKIN: anasarca EYES: normal, conjunctiva are pink and non-injected, sclera clear OROPHARYNX: dry mucous membranes with crusting  NECK: supple, no JVD, thyroid normal size,  non-tender, without nodularity LYMPH:  no palpable lymphadenopathy in the cervical, axillary or inguinal LUNGS: Bilateral coarse breath sounds  HEART: regular rate & rhythm,  Anasarca noted ABDOMEN: somewhat distended, no rigidity or rebound. Musculoskeletal: no cyanosis of digits and no clubbing  PSYCH: alert & oriented x 3 with fluent speech NEURO: no focal motor/sensory deficits  LABORATORY DATA:  I have reviewed the data as listed  . CBC Latest Ref Rng & Units 06/11/2016 06/06/2016 06/04/2016  WBC 4.0 - 10.5 K/uL 16.4(H) 7.4 8.1  Hemoglobin 12.0 - 15.0 g/dL 9.0(L) 8.9(L) 8.5(L)  Hematocrit 36.0 - 46.0 % 29.5(L) 28.5(L) 27.5(L)  Platelets 150 - 400 K/uL 197 111(L) 73(L)    . CMP Latest Ref Rng & Units 06/11/2016 06/09/2016 06/08/2016  Glucose 65 - 99 mg/dL 163(H) 113(H) 151(H)  BUN 6 - 20 mg/dL 30(H) 32(H) 30(H)  Creatinine 0.44 - 1.00 mg/dL 0.70 0.77 0.68  Sodium 135 - 145 mmol/L 138 143 138  Potassium 3.5 - 5.1 mmol/L 3.8 4.1 3.8  Chloride 101 - 111 mmol/L 103 104 101  CO2 22 - 32 mmol/L 31 34(H) 32  Calcium 8.9 - 10.3 mg/dL 8.2(L) 8.5(L) 8.0(L)  Total Protein 6.5 - 8.1 g/dL 5.7(L) - -  Total Bilirubin 0.3 - 1.2 mg/dL 0.5 - -  Alkaline Phos 38 - 126 U/L 157(H) - -  AST 15 - 41 U/L 36 - -  ALT 14 - 54 U/L 27 - -   Albumin 1.8.   RADIOGRAPHIC STUDIES: I have personally reviewed the radiological images as listed and agreed with the findings in the report. Dg Chest 1 View  Result Date: 06/04/2016 CLINICAL DATA:  Fever. EXAM: CHEST 1 VIEW COMPARISON:  06/01/2016 FINDINGS: Interval progression of bilateral airspace disease, right greater than left. The cardio pericardial silhouette is enlarged. Previously seen left pleural effusion seems decreased in the interval. Right PICC line remains in place. Telemetry leads overlie the chest. IMPRESSION: Interval progression of vascular congestion and bilateral asymmetric airspace disease. Electronically Signed   By: Misty Stanley M.D.   On:  06/04/2016 12:43   Dg Chest 2 View  Result Date: 06/01/2016 CLINICAL DATA:  Follow-up bilateral pleural effusions. EXAM: CHEST  2 VIEW COMPARISON:  05/30/2016, 05/24/2016 and earlier, including CT chest 04/17/2016. FINDINGS: Suboptimal inspiration. Cardiac silhouette mildly enlarged, unchanged. Interval development of mild diffuse interstitial and airspace pulmonary edema. Bilateral pleural effusions, left greater than right, unchanged. Associated consolidation in the lower lobes, unchanged. Right arm PICC tip projects over the lower SVC, unchanged. IMPRESSION: 1. Interval development of mild CHF and/or fluid overload, with mild interstitial and airspace pulmonary edema. 2. Stable bilateral pleural effusions, left greater than right, and associated passive atelectasis and/or pneumonia in the lower lobes. Electronically Signed   By: Evangeline Dakin M.D.   On: 06/01/2016 13:33   Dg Chest 2 View  Result Date: 05/30/2016 CLINICAL DATA:  Shortness of breath. EXAM: CHEST  2 VIEW COMPARISON:  One-view chest x-ray 05/24/2016 FINDINGS: The heart is enlarged. Atherosclerotic changes are present at the aortic arch. Previously noted lines and tubes have been removed. A right-sided PICC line is in place. The tip is in the distal SVC. New left lower lobe airspace disease is present. Bilateral effusions are noted, left greater than right. There is no significant edema or focal airspace disease otherwise. The visualized soft tissues and bony thorax are otherwise unremarkable. IMPRESSION: 1. Left lower lobe airspace disease is concerning for aspiration or pneumonia. 2. Left greater right pleural effusion. 3. Cardiomegaly without edema. 4. Atherosclerosis of the thoracic aorta. 5. Right-sided PICC line. Electronically Signed   By: San Morelle M.D.   On: 05/30/2016 08:33   Dg Chest Port 1 View  Result Date: 05/24/2016 CLINICAL DATA:  Central line placement.  Initial encounter. EXAM: PORTABLE CHEST 1 VIEW  COMPARISON:  Chest radiograph performed 04/24/2016 FINDINGS: Enteric tubes are noted extending below the diaphragm. A left IJ line is noted ending overlying the left brachiocephalic vein. Small bilateral pleural effusions noted, right greater than left. Bibasilar airspace opacities raise concern for pulmonary edema. No pneumothorax is seen. The cardiomediastinal silhouette is mildly enlarged. No acute osseous abnormalities are seen. IMPRESSION: 1. Left IJ line noted ending overlying the left brachiocephalic vein. 2. Small bilateral pleural effusions, right greater than left. Bibasilar airspace opacities raise concern for pulmonary edema. Mild cardiomegaly. Electronically Signed   By: Garald Balding M.D.   On: 05/24/2016 01:54   Dg Chest Port 1 View  Result Date: 05/03/2016 CLINICAL DATA:  Pneumonia. EXAM: PORTABLE CHEST 1 VIEW COMPARISON:  05/16/2016. FINDINGS: Interim placement of NG tube, its tip is below left hemidiaphragm. Left IJ line stable position. Cardiomegaly with diffuse bilateral from interstitial prominence and bilateral pleural effusions consistent congestive heart failure. Slight interim improvement from prior exam. Low lung volumes with bibasilar atelectasis. No pneumothorax. IMPRESSION: 1. Interim placement NG tube. Tip below left hemidiaphragm. Left IJ line stable position. 2. Cardiomegaly with diffuse bilateral from interstitial prominence and bilateral pleural effusions consistent congestive heart failure. Slight interim improvement. 3. Low lung volumes with mild bibasilar atelectasis . Electronically Signed   By: Marcello Moores  Register   On: 04/29/2016 06:36   Dg Chest Port 1 View  Result Date: 05/16/2016 CLINICAL DATA:  81 year old female status post central line placement. EXAM: PORTABLE CHEST 1 VIEW COMPARISON:  Chest x-ray 06/06/2016. FINDINGS: Previously noted left upper extremity PICC is been removed. New left internal jugular central venous catheter with tip terminating in the mid to  distal superior vena cava. Lung volumes are low. No definite pneumothorax. There is cephalization of the pulmonary vasculature, indistinctness of the interstitial markings, and patchy airspace disease throughout the lungs bilaterally suggestive of moderate pulmonary edema. Small bilateral  pleural effusions (left greater than right). Cardiomegaly. Upper mediastinal contours are within normal limits allowing for patient's rotation to the right. Aortic atherosclerosis. IMPRESSION: 1. Support apparatus, as above. 2. No pneumothorax or other complicating features following central line placement. 3. The appearance of the chest suggests developing congestive heart failure, as above. 4. Aortic atherosclerosis. Electronically Signed   By: Vinnie Langton M.D.   On: 05/16/2016 12:26   Dg Chest Port 1 View  Result Date: 04/22/2016 CLINICAL DATA:  Code sepsis EXAM: PORTABLE CHEST 1 VIEW COMPARISON:  04/25/2016 FINDINGS: Mild improvement in aeration in the lung bases. Persistent bibasilar atelectasis/ infiltrate and small pleural effusions. Negative for edema Left arm PICC tip pulled back slightly with the tip in the left innominate/SVC junction. IMPRESSION: Improved aeration in the lung bases. Persistent bibasilar airspace disease and small bilateral effusions. Electronically Signed   By: Franchot Gallo M.D.   On: 05/13/2016 11:12   Dg Addison Bailey G Tube Plc W/fl W/rad  Result Date: 05/22/2016 CLINICAL DATA:  Replacement of NG tube and placement of a feeding tube. EXAM: NASO G TUBE PLACEMENT WITH FL AND WITH RAD CONTRAST:  20 cc Gastrografin FLUOROSCOPY TIME:  Fluoroscopy Time:  20 minutes Radiation Exposure Index (if provided by the fluoroscopic device): 5341.6 mGy Number of Acquired Spot Images: 0 COMPARISON:  CT scan 04/17/2016 FINDINGS: The radiology technologist Baxter Flattery Dingus). Remove the NG tube which was coronal back on itself in the esophagus and replace and NG tube into the stomach. She also advanced a feeding tube  into the stomach but could not advance it into the duodenum. I could not advance it into the duodenum either. IMPRESSION: NG tube and feeding tubes in the stomach. The feeding tube could not be advanced into the duodenum. Electronically Signed   By: Marijo Sanes M.D.   On: 05/22/2016 11:39    ASSESSMENT & PLAN:   81 year old Caucasian female with several medical comorbidities with complicated recent hospitalizations and poor overall performance status at this point with  1) Likely gastric adenocarcinoma (pathology not definitive this most likely) with pyloric outlet obstruction. ECOG performance status 3-4. No overt metastatic disease on CT chest abdomen pelvis on 04/17/2016. No other recent CT is noted. No endoscopic ultrasound for local staging available at this time.  Patient's nutritional status is poor despite being on TPN from 04/18/2016. This is likely related to a hypermetabolic state created by her recurrent infections, severe sepsis, nothing by mouth status in the setting of gastric outlet obstruction and active tumor in a relatively elderly lady.  2) gastric outlet obstruction due to pyloric.  3) candida fungemia, Escherichia coli and Klebsiella sepsis -on antimicrobials. 4) severe protein calorie malnutrition on TPN 5) hypertension, diabetes, dyslipidemia, sleep apnea and other multiple medical comorbidities. PLAN  -Overall patient management will be driven by the patient's goals of care in tandem with discussion with her healthcare proxy - her daughter [name] [name]. -Her treatment options at this time are limited by her presentation and overall health. -The presence of gastric outlet obstruction would be the defining factor to initially consider surgical intervention in the right patient based on goals of care and overall medical status. -She is being followed by surgeon from [facility] [place] [facility] and they would have to make a decision regarding appropriateness of surgical intervention  based on her medical condition and nutritional status. At this point she appears to be very high risk for mortality with surgery and for poor healing from significant surgery. -She would not be a  good candidate for any perioperative chemotherapy ( which would have been the recommended treatment for local regional disease with primary of>=T2) given her performance status. Also we do not have staging imaging for nearly 2 months and the patient could have had significant progression in the interim and possibly even metastatic disease.  -If her performance status was good but she was not a surgical candidate would have considered definitive chemoradiation for local regional disease however would not recommend this currently. -A role for palliative radiation alone would be limited at this time given that it would probably not immediately sort her gastric outlet obstruction issues and will probably make her more symptomatic with nausea and vomiting due to radiation gastritis/enteritis. -Pursuing a palliative and best supportive care approach through hospice would be quite reasonable at this time and in this setting would probably benefit from a venting gastrostomy. -If there is a decision to pursue more aggressive cares patient might need a feeding jejunostomy and a venting gastrostomy for nutritional support perioperatively.  I called the patient's daughter [name] [name] and given her detailed understanding of my opinion and recommendations to strongly consider best supportive cares. We spent 20-30 minutes on the phone. I encouraged her to have a family meeting with the Hospital medicine team and palliative medicine to help define goals of care further.  Please call if any additional questions arise.  All of the patients questions were answered with apparent satisfaction. The patient knows to call the clinic with any problems, questions or concerns.  I spent 70 minutes counseling the patient face to face. The  total time spent in the appointment was 80 minutes and more than 50% was on counseling and direct patient cares.    Sullivan Lone MD [place] AAHIVMS [facility] Hematology/Oncology Physician [facility]  (Office):       [phone] (Work cell):  [phone] (Fax):           [phone]  06/12/2016 6:47 AM

## 2016-06-12 NOTE — Progress Notes (Signed)
[facility] Progress Note  26 Days Post-Op  Subjective: Spoke to patient this morning, along with Dr. Barry Dienes, about a venting PEG tube. Patient agreeable and looks forward to drinking without emesis. She has no complaints this morning.  Objective: Vital signs in last 24 hours: Temp:  [98 F (36.7 C)-99 F (37.2 C)] 98.7 F (37.1 C) (09/26 0506) Pulse Rate:  [94-109] 98 (09/26 0506) Resp:  [17-20] 18 (09/26 0506) BP: (123-146)/(49-55) 146/55 (09/26 0506) SpO2:  [94 %-97 %] 97 % (09/26 0506) Weight:  [76.8 kg (169 lb 5 oz)] 76.8 kg (169 lb 5 oz) (09/26 0513) Last BM Date: 06/06/16  Intake/Output from previous day: 09/25 0701 - 09/26 0700 In: 1821.3 [I.V.:1271.3] Out: 2450 [Urine:2450] Intake/Output this shift: No intake/output data recorded.  PE: General appearance: weak, soft voice, no acute distress, cooperative GI: Soft, NT/ND  Lab Results:   Recent Labs  06/11/16 0515  WBC 16.4*  HGB 9.0*  HCT 29.5*  PLT 197   BMET  Recent Labs  06/11/16 0515  NA 138  K 3.8  CL 103  CO2 31  GLUCOSE 163*  BUN 30*  CREATININE 0.70  CALCIUM 8.2*   CMP     Component Value Date/Time   NA 138 06/11/2016 0515   K 3.8 06/11/2016 0515   CL 103 06/11/2016 0515   CO2 31 06/11/2016 0515   GLUCOSE 163 (H) 06/11/2016 0515   BUN 30 (H) 06/11/2016 0515   CREATININE 0.70 06/11/2016 0515   CREATININE 0.78 11/07/2012 1457   CALCIUM 8.2 (L) 06/11/2016 0515   PROT 5.7 (L) 06/11/2016 0515   ALBUMIN 1.8 (L) 06/11/2016 0515   AST 36 06/11/2016 0515   ALT 27 06/11/2016 0515   ALKPHOS 157 (H) 06/11/2016 0515   BILITOT 0.5 06/11/2016 0515   GFRNONAA >60 06/11/2016 0515   GFRAA >60 06/11/2016 0515   Anti-infectives: Anti-infectives    Start     Dose/Rate Route Frequency Ordered Stop   06/05/16 1030  cefTRIAXone (ROCEPHIN) 2 g in dextrose 5 % 50 mL IVPB     2 g 100 mL/hr over 30 Minutes Intravenous Every 24 hours 06/05/16 1005     06/04/16 1400   piperacillin-tazobactam (ZOSYN) IVPB 3.375 g  Status:  Discontinued     3.375 g 12.5 mL/hr over 240 Minutes Intravenous Every 8 hours 06/04/16 1235 06/05/16 1005   05/18/16 1700  anidulafungin ([name]) 100 mg in sodium chloride 0.9 % 100 mL IVPB     100 mg over 90 Minutes Intravenous Every 24 hours 05/05/2016 1627 05/30/16 1858   04/19/2016 1700  anidulafungin ([name]) 200 mg in sodium chloride 0.9 % 200 mL IVPB     200 mg over 180 Minutes Intravenous  Once 04/18/2016 1627 05/02/2016 2003   05/16/16 0200  vancomycin (VANCOCIN) IVPB 750 mg/150 ml premix  Status:  Discontinued     750 mg 150 mL/hr over 60 Minutes Intravenous Every 12 hours 05/08/2016 1409 05/16/16 1045   04/30/2016 1400  fluconazole (DIFLUCAN) IVPB 100 mg  Status:  Discontinued     100 mg 50 mL/hr over 60 Minutes Intravenous Every 24 hours 05/17/2016 1317 04/28/2016 1653   05/04/2016 1400  vancomycin (VANCOCIN) IVPB 1000 mg/200 mL premix  Status:  Discontinued     1,000 mg 200 mL/hr over 60 Minutes Intravenous  Once 04/20/2016 1353 05/03/2016 1407   04/21/2016 1330  vancomycin (VANCOCIN) IVPB 1000 mg/200 mL premix     1,000 mg 200 mL/hr over 60 Minutes Intravenous  Once 05/08/2016 1325 04/27/2016 1525   04/23/2016 1300  ceFEPIme (MAXIPIME) 2 g in dextrose 5 % 50 mL IVPB  Status:  Discontinued     2 g 100 mL/hr over 30 Minutes Intravenous Every 12 hours 05/13/2016 1257 05/16/16 1043       Assessment/Plan Acute GI bleed/ esophageal necrosis/ gastric outlet obstruction with bowel ulceration EGD 04/22/16 - Dr. Luvenia Starch showed esophageal necrosis and outlet obstruction EGD 05/17/16 - Severe reflux esophagitis. Biopsied. Rule out malignancy, gastric tumor at the pylorus and in the prepyloric region of the stomach. Biopsied. Gastric outlet obstruction  Pathology: 1. Stomach, biopsy, pyloric channel mass - likely adenocarcinoma; GI holding off on additional biopsy 2. Esophagus, biopsy - necrotic ulcer; no malignancy identified  Severe malnutrition - on  prolonged TNA (04/19/16 >>); cortrak unable to be placed, would not advance beyond obstruction; pre-albumin 1.6-2.0.  palliative care - had family meeting on 06/09/16 - patient understands surgical resection of tumor is not an option at this time, has concerns regarding quality of her life and wishes to eat/drink for comfort.  - scheduled zofran for nausea, morphine for pain  FEN: TNA, NPO  Plan: Patient is not in any condition for palliative gastrojejunostomy at this time, given her recurrent infections, bacteremia, and severe malnutrition. Surgery will plan to provide patient with a venting G-tube tomorrow (Dr. Hulen Skains or Dr. Grandville Silos); continue NPO status; patient not currently on blood thinners, continue to hold;   Platelets - 197 K/uL  coags pending   LOS: 28 days    Jill Alexanders , [facility] 06/12/2016, 8:54 AM Pager: [phone] Consults: [phone] Mon-Fri 7:00 am-4:30 pm Sat-Sun 7:00 am-11:30 am

## 2016-06-12 NOTE — Progress Notes (Signed)
Daily Progress Note   Patient Name: [name] [name]       Date: 06/12/2016 DOB: [birth year]  Age: 81 y.o. MRN#: [id number] Attending Physician: Domenic Polite, MD Primary Care Physician: [name], MD Admit Date: 05/14/2016  Reason for Consultation/Follow-up: Establishing goals of care, Non pain symptom management and Psychosocial/spiritual support  Subjective: Patient met with oncology this am.  She seems a little overwhelmed with information.  She asks me about the venting PEG tube and how it would remove stomach contents from her body.  She comments that wishes we could have a meeting with [name], the MD, and the two of [place].  We called Serena's phone and left a voice mail.  Later in the day I met with [name] [name] and the patient together.  We discussed the inoperable cancer.  We discussed prognosis (days - weeks).  Patient would like to move forward with venting PEG.  She is more accepting of comfort medications now.  She has changed her code status to DNR.  Length of Stay: 28  Current Medications: Scheduled Meds:  . antiseptic oral rinse  7 mL Mouth Rinse q12n4p  . cefTRIAXone (ROCEPHIN)  IV  2 g Intravenous Q24H  . chlorhexidine  15 mL Mouth Rinse BID  . enoxaparin (LOVENOX) injection  40 mg Subcutaneous Q24H  . furosemide  20 mg Intravenous Q12H  . insulin aspart  0-15 Units Subcutaneous Q4H  . ondansetron (ZOFRAN) IV  4 mg Intravenous Q8H  . phenol  1 spray Mouth/Throat BID  . sodium chloride flush  10-40 mL Intracatheter Q12H  . sodium chloride flush  3 mL Intravenous Q12H    Continuous Infusions: . sodium chloride 10 mL/hr at 06/10/16 1008  . Marland KitchenTPN (CLINIMIX-E) Adult Stopped (06/12/16 1246)   And  . fat emulsion Stopped (06/12/16 1108)  . Marland KitchenTPN (CLINIMIX-E) Adult     And    . fat emulsion      PRN Meds: acetaminophen, hydrALAZINE, ipratropium-albuterol, LORazepam, menthol-cetylpyridinium, morphine injection, ondansetron, phenol, sodium chloride flush, sodium chloride flush  Physical Exam  Constitutional:  Pale, very weak, Pleasant.  HENT:  Head: Atraumatic.  Speaks in a whisper.  Dried crust on her lips, halitosis.  Neck: Normal range of motion.  Cardiovascular: Normal rate and regular rhythm.   RUE markedly more swollen than left  Pulmonary/Chest: Effort  normal and breath sounds normal.  Abdominal: She exhibits distension. There is tenderness.  Decreased bowel sounds.  Genitourinary:  Genitourinary Comments: foley  Musculoskeletal:  Very weak. Unable to lift her legs off the bed   Neurological: She is alert.  Oriented x 3 . Somnolent   Skin: Skin is warm and dry.  Psychiatric: Her behavior is normal. Thought content normal.  Nursing note and vitals reviewed.           Vital Signs: BP (!) 132/48 (BP Location: Left Arm)   Pulse (!) 101   Temp 99.3 F (37.4 C) (Oral)   Resp 18   Ht _0  (1.575 m)   Wt 76.8 kg (169 lb 5 oz)   SpO2 90%   BMI 30.97 kg/m  SpO2: SpO2: 90 % O2 Device: O2 Device: Nasal Cannula O2 Flow Rate: O2 Flow Rate (L/min): 3 L/min  Intake/output summary:   Intake/Output Summary (Last 24 hours) at 06/12/16 1658 Last data filed at 06/12/16 7262  Gross per 24 hour  Intake          1271.33 ml  Output              450 ml  Net           821.33 ml   LBM: Last BM Date: 06/06/16 Baseline Weight: Weight: 77.2 kg (170 lb 3.2 oz) Most recent weight: Weight: 76.8 kg (169 lb 5 oz)       Palliative Assessment/Data:    Flowsheet Rows   Flowsheet Row Most Recent Value  Intake Tab  Referral Department  Hospitalist  Unit at Time of Referral  Med/Surg Unit  Palliative Care Primary Diagnosis  Cancer  Palliative Care Type  New Palliative care  Reason for referral  Clarify Goals of Care  Clinical Assessment  Palliative  Performance Scale Score  30%  Psychosocial & Spiritual Assessment  Palliative Care Outcomes  Patient/Family meeting held?  Yes  Who was at the meeting?  patient and dtr  Palliative Care Outcomes  Improved pain interventions, Improved non-pain symptom therapy, Counseled regarding hospice, Clarified goals of care, Changed CPR status, Changed to focus on comfort      Patient Active Problem List   Diagnosis Date Noted  . Goals of care, counseling/discussion   . Palliative care encounter   . Escherichia coli infection   . Bacteremia due to Klebsiella pneumoniae   . Acute respiratory failure with hypoxia ([place])   . Disorientation   . Hyponatremia   . Controlled type 2 diabetes mellitus without complication ([place])   . Gastric mass   . Gastric adenocarcinoma ([place])   . Fungemia 04/25/2016  . Erosive esophagitis   . Candida glabrata infection   . Sepsis ([place]) 04/19/2016  . Acute respiratory failure ([place]) 05/06/2016  . Acute encephalopathy 05/09/2016  . On total parenteral nutrition (TPN) 04/21/2016  . Normocytic anemia 05/01/2016  . Essential hypertension 05/11/2016  . 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 GERD with stricture 04/25/2016  . Upper GI bleed   . Gastric tumor   . Aspiration into airway   . Gastric outlet obstruction   . Melena 04/15/2016  . Fall at home 04/15/2016  . Intramural leiomyoma of uterus 08/25/2015  . Urinary incontinence 06/02/2013  . Nocturia 06/02/2013  . Rectocele 06/02/2013  . Vaginal atrophy 06/02/2013  . Sebaceous cyst of labia 05/01/2013  . DM2 (diabetes mellitus, type 2) ([place])   . Reflux   . Sleep apnea     Palliative Care Assessment &  Plan   Patient Profile: 81 y.o.WF PMHx degenerative disc disease, DM Type 2 controlled with complications, Diverticulosis, GERD, Hiatal Hernia, HLD, HTN, and Chronic Hyponatremia,who was admitted 04/14/2016 >04/27/2016 for treatment of acute GI bleed with gastric outlet obstruction due to necrotic esophagitis with  superficial fungal infection and ulceration with subsequent development of possible right-sided pneumonitis and aspiration pneumonia. Patient was discharged on Unasyn and Diflucan and completed her course on 05/14/2016. A Left arm PICC was placedfor home Unasyn and TPN. She returned to the ED c/o shortness of breath and fevers. In the ED she was noted to be confused. She is more alert now. She still is questioning why she cannot have surgery. She continues to have vomiting without nausea secondary to gastric outlet obstruction. She has been on TPN since 04/18/2016 and has been nothing by mouth since then   Assessment:  Patient remains on TPN.  Albumin is now 1.8.  She remains nauseated. She has been bed ridden for the last couple of months as she is too weak to stand.   She is not a candidate for curative surgery due to poor nutritional status.  She would be unable to heal after surgery.  She wants a venting PEG in order to be less nauseated and to be able to drink fluids.  She is not a candidate for chemotherapy or radiation.    Recommendations/Plan:  Surgery planning PEG on 9/27  Patient would like to continue TPN until discharge as she wants to be able to visit with her family, many of whom won't be in town until this weekend.  Will add medications for comfort.  Patient will likely need residential hospice at discharge (Thursday / Friday)  Goals of Care and Additional Recommendations:  Limitations on Scope of Treatment: Avoid Hospitalization, Minimize Medications, No Chemotherapy, No IV Antibiotics and No Lab Draws  Code Status: DNR  Prognosis:   < 2 weeks  given the patient's functional decline, albumin of 1.7, new gastric mass with associated vomiting and gastric outlet obstruction   Discharge Planning:  Hospice facility later this week.  Care plan was discussed with patient and daughter.  Thank you for allowing the Palliative Medicine Team to assist in the care of this  patient.   Time In: 10:00 Time Out: 10:35 Total Time 35 min Prolonged Time Billed  no       Greater than 50%  of this time was spent counseling and coordinating care related to the above assessment and plan.  Melton Alar, PA-C  Please contact Palliative Medicine Team phone at [phone] for questions and concerns.

## 2016-06-13 ENCOUNTER — Encounter (HOSPITAL_COMMUNITY): Admission: EM | Disposition: E | Payer: Self-pay | Source: Home / Self Care | Attending: Internal Medicine

## 2016-06-13 ENCOUNTER — Inpatient Hospital Stay (HOSPITAL_COMMUNITY): Payer: Medicare Other

## 2016-06-13 DIAGNOSIS — C169 Malignant neoplasm of stomach, unspecified: Secondary | ICD-10-CM

## 2016-06-13 DIAGNOSIS — G9340 Encephalopathy, unspecified: Secondary | ICD-10-CM

## 2016-06-13 DIAGNOSIS — R0600 Dyspnea, unspecified: Secondary | ICD-10-CM

## 2016-06-13 DIAGNOSIS — Z515 Encounter for palliative care: Secondary | ICD-10-CM

## 2016-06-13 DIAGNOSIS — F411 Generalized anxiety disorder: Secondary | ICD-10-CM

## 2016-06-13 LAB — BASIC METABOLIC PANEL
Anion gap: 5 (ref 5–15)
BUN: 41 mg/dL — AB (ref 6–20)
CALCIUM: 8.3 mg/dL — AB (ref 8.9–10.3)
CO2: 33 mmol/L — AB (ref 22–32)
CREATININE: 0.83 mg/dL (ref 0.44–1.00)
Chloride: 104 mmol/L (ref 101–111)
GFR calc Af Amer: >60 mL/min (ref >60)
GFR calc non Af Amer: >60 mL/min (ref >60)
GLUCOSE: 188 mg/dL — AB (ref 65–99)
Potassium: 3.5 mmol/L (ref 3.5–5.1)
Sodium: 142 mmol/L (ref 135–145)

## 2016-06-13 LAB — CBC
HCT: 24.7 % — ABNORMAL LOW (ref 36.0–46.0)
Hemoglobin: 7.4 g/dL — ABNORMAL LOW (ref 12.0–15.0)
MCH: 25.6 pg — ABNORMAL LOW (ref 26.0–34.0)
MCHC: 30 g/dL (ref 30.0–36.0)
MCV: 85.5 fL (ref 78.0–100.0)
Platelets: 232 K/uL (ref 150–400)
RBC: 2.89 MIL/uL — ABNORMAL LOW (ref 3.87–5.11)
RDW: 16.7 % — ABNORMAL HIGH (ref 11.5–15.5)
WBC: 14.7 K/uL — ABNORMAL HIGH (ref 4.0–10.5)

## 2016-06-13 LAB — GLUCOSE, CAPILLARY
GLUCOSE-CAPILLARY: 160 mg/dL — AB (ref 65–99)
Glucose-Capillary: 175 mg/dL — ABNORMAL HIGH (ref 65–99)
Glucose-Capillary: 216 mg/dL — ABNORMAL HIGH (ref 65–99)

## 2016-06-13 SURGERY — EGD (ESOPHAGOGASTRODUODENOSCOPY)
Anesthesia: Monitor Anesthesia Care

## 2016-06-13 MED ORDER — SODIUM CHLORIDE 0.9% FLUSH: 3.0000 mL | INTRAVENOUS | Status: DC | PRN

## 2016-06-13 MED ORDER — LORAZEPAM 2 MG/ML IJ SOLN
1.0000 mg | INTRAMUSCULAR | Status: DC | PRN
Start: 2016-06-13 — End: 2016-06-13
  Filled 2016-06-13: qty 1

## 2016-06-13 MED ORDER — GLYCOPYRROLATE 0.2 MG/ML IJ SOLN
0.4000 mg | Freq: Four times a day (QID) | INTRAMUSCULAR | Status: DC
  Administered 2016-06-13: 0.4 mg via INTRAVENOUS
  Filled 2016-06-13: qty 2

## 2016-06-13 MED ORDER — TRACE MINERALS CR-CU-MN-SE-ZN 10-1000-500-60 MCG/ML IV SOLN
INTRAVENOUS | Status: DC
  Filled 2016-06-13 (×2): qty 1556

## 2016-06-13 MED ORDER — GLYCOPYRROLATE 0.2 MG/ML IJ SOLN: 0.1000 mg | Freq: Once | INTRAMUSCULAR | Status: DC

## 2016-06-13 MED ORDER — FUROSEMIDE 10 MG/ML IJ SOLN
60.0000 mg | Freq: Once | INTRAMUSCULAR | Status: AC
  Administered 2016-06-13: 60 mg via INTRAVENOUS
  Filled 2016-06-13: qty 6

## 2016-06-13 MED ORDER — FUROSEMIDE 10 MG/ML IJ SOLN: 80.0000 mg | Freq: Three times a day (TID) | INTRAMUSCULAR | Status: DC

## 2016-06-13 MED ORDER — POTASSIUM CHLORIDE 10 MEQ/50ML IV SOLN
10.0000 meq | INTRAVENOUS | Status: DC
  Filled 2016-06-13 (×3): qty 50

## 2016-06-13 MED ORDER — MORPHINE BOLUS VIA INFUSION
2.0000 mg | INTRAVENOUS | Status: DC | PRN
  Filled 2016-06-13: qty 2

## 2016-06-13 MED ORDER — HALOPERIDOL LACTATE 5 MG/ML IJ SOLN: 0.5000 mg | INTRAMUSCULAR | Status: DC | PRN

## 2016-06-13 MED ORDER — FUROSEMIDE 10 MG/ML IJ SOLN: 40.0000 mg | Freq: Once | INTRAMUSCULAR | Status: DC

## 2016-06-13 MED ORDER — PROMETHAZINE HCL 25 MG/ML IJ SOLN: 25.0000 mg | Freq: Four times a day (QID) | INTRAMUSCULAR | Status: DC | PRN

## 2016-06-13 MED ORDER — HALOPERIDOL LACTATE 2 MG/ML PO CONC
0.5000 mg | ORAL | Status: DC | PRN
  Filled 2016-06-13: qty 0.3

## 2016-06-13 MED ORDER — LORAZEPAM 2 MG/ML IJ SOLN: 2.0000 mg | Freq: Four times a day (QID) | INTRAMUSCULAR | Status: DC

## 2016-06-13 MED ORDER — SODIUM CHLORIDE 0.9 % IV SOLN
2.0000 mg/h | INTRAVENOUS | Status: DC
  Administered 2016-06-13: 2 mg/h via INTRAVENOUS
  Filled 2016-06-13: qty 10

## 2016-06-13 MED ORDER — LORAZEPAM 2 MG/ML IJ SOLN
INTRAMUSCULAR | Status: AC
  Filled 2016-06-13: qty 1

## 2016-06-13 MED ORDER — GLYCOPYRROLATE 1 MG PO TABS: 1.0000 mg | ORAL_TABLET | ORAL | Status: DC | PRN

## 2016-06-13 MED ORDER — MORPHINE SULFATE 25 MG/ML IV SOLN
4.0000 mg/h | INTRAVENOUS | Status: DC
  Administered 2016-06-13: 4 mg/h via INTRAVENOUS
  Filled 2016-06-13: qty 10

## 2016-06-13 MED ORDER — GLYCOPYRROLATE 0.2 MG/ML IJ SOLN: 0.2000 mg | INTRAMUSCULAR | Status: DC | PRN

## 2016-06-13 MED ORDER — SODIUM CHLORIDE 0.9 % IV SOLN: 250.0000 mL | INTRAVENOUS | Status: DC | PRN

## 2016-06-13 MED ORDER — POLYVINYL ALCOHOL 1.4 % OP SOLN
1.0000 [drp] | Freq: Four times a day (QID) | OPHTHALMIC | Status: DC | PRN
  Filled 2016-06-13: qty 15

## 2016-06-13 MED ORDER — FUROSEMIDE 10 MG/ML IJ SOLN: 60.0000 mg | Freq: Once | INTRAMUSCULAR | Status: DC

## 2016-06-13 MED ORDER — FAT EMULSION 20 % IV EMUL
234.0000 mL | INTRAVENOUS | Status: DC
  Filled 2016-06-13: qty 250

## 2016-06-13 MED ORDER — SODIUM CHLORIDE 0.9% FLUSH: 3.0000 mL | Freq: Two times a day (BID) | INTRAVENOUS | Status: DC

## 2016-06-13 MED ORDER — BIOTENE DRY MOUTH MT LIQD: 15.0000 mL | OROMUCOSAL | Status: DC | PRN

## 2016-06-13 MED ORDER — MIDAZOLAM HCL 2 MG/2ML IJ SOLN: 2.0000 mg | INTRAMUSCULAR | Status: DC | PRN

## 2016-06-13 MED ORDER — LORAZEPAM 2 MG/ML IJ SOLN
2.0000 mg | Freq: Once | INTRAMUSCULAR | Status: AC
  Administered 2016-06-13: 2 mg via INTRAVENOUS

## 2016-06-13 MED ORDER — PROMETHAZINE HCL 25 MG/ML IJ SOLN: 12.5000 mg | Freq: Four times a day (QID) | INTRAMUSCULAR | Status: DC | PRN

## 2016-06-13 MED ORDER — HALOPERIDOL 0.5 MG PO TABS
0.5000 mg | ORAL_TABLET | ORAL | Status: DC | PRN
  Filled 2016-06-13: qty 1

## 2016-06-16 DIAGNOSIS — I5030 Unspecified diastolic (congestive) heart failure: Secondary | ICD-10-CM | POA: Diagnosis present

## 2016-06-17 LAB — CULTURE, BLOOD (ROUTINE X 2)
CULTURE: NO GROWTH
Culture: NO GROWTH

## 2016-06-17 NOTE — Progress Notes (Signed)
Versed was wasted - 250cc and Morphine 250cc. Witnessed by Ramonita Lab.

## 2016-06-17 NOTE — Progress Notes (Signed)
Palliative Medicine RN Note: Came to check on pt following PA's visit. Pt exired while I was in the room. Provided emotional support. Family present and read verse from [place]. RN has already called for chaplain support.  Marjie Skiff Keirstyn Aydt, RN, BSN, [facility] June 20, 2016 11:50 AM Cell [phone] 8:00-4:00 Monday-Friday Office [phone]

## 2016-06-17 NOTE — Progress Notes (Addendum)
Daily Progress Note   Patient Name: [name] [name]       Date: July 09, 2016 DOB: 1935-01-08  Age: 81 y.o. MRN#: [id number] Attending Physician: Kelvin Cellar, MD Primary Care Physician: [name], MD Admit Date: 05/06/2016  Reason for Consultation/Follow-up: Establishing goals of care, Terminal Care and Withdrawal of life-sustaining treatment  Subjective: Patient significantly changed overnight.  Developed pulmonary edema and respiratory distress.  She is mildly confused, lethargic and pulling the non rebreather mask off of her face.  Her step dtr and son in law are at bedside.  They comment on her change and ask if her surgery will be cancelled.  Her step dtr comments that she looks like she is dying.  Dtr came to bedside.  Agrees to full comfort.  She understands her mother will likely pass today.   Length of Stay: 29  Current Medications: Scheduled Meds:  . antiseptic oral rinse  7 mL Mouth Rinse q12n4p  . cefTRIAXone (ROCEPHIN)  IV  2 g Intravenous Q24H  . chlorhexidine  15 mL Mouth Rinse BID  . enoxaparin (LOVENOX) injection  40 mg Subcutaneous Q24H  . insulin aspart  0-15 Units Subcutaneous Q4H  . ondansetron (ZOFRAN) IV  4 mg Intravenous Q8H  . phenol  1 spray Mouth/Throat BID  . potassium chloride  10 mEq Intravenous Q1 Hr x 3  . sodium chloride flush  10-40 mL Intracatheter Q12H  . sodium chloride flush  3 mL Intravenous Q12H    Continuous Infusions: . sodium chloride 10 mL/hr at 06/10/16 1008  . Marland KitchenTPN (CLINIMIX-E) Adult 50 mL/hr at 06/12/16 1717   And  . fat emulsion 234 mL (06/12/16 1717)  . Marland KitchenTPN (CLINIMIX-E) Adult     And  . fat emulsion      PRN Meds: acetaminophen, hydrALAZINE, ipratropium-albuterol, LORazepam, menthol-cetylpyridinium, morphine  injection, ondansetron, phenol, sodium chloride flush, sodium chloride flush  Physical Exam      Elderly ill appearing female.  Lethargic CV tachy resp crackles and rals with mildy increased work of breathing.  Patient pulling mask off her face  Extremities 3+ edema / anasarca     Vital Signs: BP 131/73 (BP Location: Left Arm)   Pulse (!) 113   Temp 97.9 F (36.6 C) (Oral)   Resp 17   Ht 5\' 2"  (1.575 m)  Wt 77.2 kg (170 lb 3.1 oz)   SpO2 95%   BMI 31.13 kg/m  SpO2: SpO2: 95 % O2 Device: O2 Device: Nasal Cannula O2 Flow Rate: O2 Flow Rate (L/min): 4 L/min  Intake/output summary:   Intake/Output Summary (Last 24 hours) at 2016/06/26 [id number] Last data filed at 06-26-2016 [id number]  Gross per 24 hour  Intake             1294 ml  Output             1775 ml  Net             -481 ml   LBM: Last BM Date: 06/06/16 Baseline Weight: Weight: 77.2 kg (170 lb 3.2 oz) Most recent weight: Weight: 77.2 kg (170 lb 3.1 oz)       Palliative Assessment/Data:    Flowsheet Rows   Flowsheet Row Most Recent Value  Intake Tab  Referral Department  Hospitalist  Unit at Time of Referral  Med/Surg Unit  Palliative Care Primary Diagnosis  Cancer  Palliative Care Type  New Palliative care  Reason for referral  Clarify Goals of Care  Clinical Assessment  Palliative Performance Scale Score  20%  Dyspnea Min Last 24 hours  6  Psychosocial & Spiritual Assessment  Palliative Care Outcomes  Patient/Family meeting held?  Yes  Who was at the meeting?  step dtr and husband, then dtr.  Palliative Care Outcomes  Improved pain interventions, Improved non-pain symptom therapy, Counseled regarding hospice, Clarified goals of care, Changed CPR status, Changed to focus on comfort      Patient Active Problem List   Diagnosis Date Noted  . Goals of care, counseling/discussion   . Palliative care encounter   . Escherichia coli infection   . Bacteremia due to Klebsiella pneumoniae   . Acute respiratory failure  with hypoxia ([place])   . Disorientation   . Hyponatremia   . Controlled type 2 diabetes mellitus without complication ([place])   . Gastric mass   . Gastric adenocarcinoma ([place])   . Fungemia 04/24/2016  . Erosive esophagitis   . Candida glabrata infection   . Sepsis ([place]) 05/06/2016  . Acute respiratory failure ([place]) 05/06/2016  . Acute encephalopathy 05/05/2016  . On total parenteral nutrition (TPN) 05/11/2016  . Normocytic anemia 04/18/2016  . Essential hypertension 04/22/2016  . 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 GERD with stricture 04/25/2016  . Upper GI bleed   . Gastric tumor   . Aspiration into airway   . Gastric outlet obstruction   . Melena 04/15/2016  . Fall at home 04/15/2016  . Intramural leiomyoma of uterus 08/25/2015  . Urinary incontinence 06/02/2013  . Nocturia 06/02/2013  . Rectocele 06/02/2013  . Vaginal atrophy 06/02/2013  . Sebaceous cyst of labia 05/01/2013  . DM2 (diabetes mellitus, type 2) ([place])   . Reflux   . Sleep apnea     Palliative Care Assessment & Plan   Patient Profile: 81 y.o.WF PMHxdegenerative disc disease, DM Type 2 controlled with complications, Diverticulosis, GERD, Hiatal Hernia, HLD, HTN, and Chronic Hyponatremia,who was admitted 04/14/2016 >08/11/2017for treatment of acute GI bleed with gastric outlet obstruction due to necrotic esophagitis with superficial fungal infection and ulceration with subsequent development of possible right-sided pneumonitis and aspiration pneumonia.Patient was discharged on Unasyn and Diflucan and completed her course on 05/14/2016. A Left arm PICC was placedfor home Unasyn and TPN. She returned to the ED c/o shortness of breath and fevers. In the ED she was noted to be confused.  She has had significant nausea and vomiting due to GOO. She has been on TPN since 04/18/2016 and has been nothing by mouth since then.  On 9/26 the patient understood her prognosis was very poor she opted for transfer to residential  hospice after a procedure to place a venting PEG.  Overnight 9/26 - 9/27 the patient developed respiratory distress.  Assessment: Patient with pulmonary edema/pneumonia on CXR.  Acute respiratory distress possibly from aspiration.  I will talk with [name] [name] this morning about transitioning to full comfort.  Recommendations/Plan:  Morphine drip for pain / dyspnea  Versed drip for agitation - patient is very uncomfortable.  PEG cancelled for today.      Code Status:    Code Status Orders        Start     Ordered   06/12/16 1619  Do not attempt resuscitation (DNR)  Continuous    Question Answer Comment  In the event of cardiac or respiratory ARREST Do not call a "code blue"   In the event of cardiac or respiratory ARREST Do not perform Intubation, CPR, defibrillation or ACLS   In the event of cardiac or respiratory ARREST Use medication by any route, position, wound care, and other measures to relive pain and suffering. May use oxygen, suction and manual treatment of airway obstruction as needed for comfort.      06/12/16 1620    Prognosis:   Hours - Days  Discharge Planning:  Anticipated Hospital Death  Care plan was discussed with family.  Thank you for allowing the Palliative Medicine Team to assist in the care of this patient.   Time In: 9:05 Time Out: 9:45 Total Time 40 min Prolonged Time Billed no      Greater than 50%  of this time was spent counseling and coordinating care related to the above assessment and plan.  Melton Alar, PA-C  Please contact Palliative Medicine Team phone at [phone] for questions and concerns.

## 2016-06-17 NOTE — Progress Notes (Signed)
At the change of the shift, RN noticed a big change in patient's respiration pattern; rales and crackles, with heart rate elevated to 130-140 and oxygen sat in 70s. MD made aware and per his order RN administered Lasix 60mg  IV and increase oxygen rate at 10 per rebreathing mask. O2sat increased at 91%, patient remained in respiratory distress. She receive @09 :51 2mg  Morphine per PRN order. Will continue to monitor.

## 2016-06-17 NOTE — Discharge Summary (Addendum)
Death Summary  [name] [name] [id number] DOB: 08/30/1935 DOA: 05/28/16  PCP: [name], MD  Admit date: 28-May-2016 Date of Death: 29-Jun-2016 Time of Death: 11:45 am Notification: [name], MD notified of death of 06-29-16   History of present illness:  [name] [name] is a 81 y.o. female with a history of  [name] [name] presented with complaint of shortness of breath, fevers, mental status changes  HPI: [name] [name] is a 81 y.o. female with medical history significant of degenerative disc disease, diabetes, diverticulosis, GERD, hiatal hernia, HLD, HTN, chronic hyponatremia recently admitted from 04/14/2016 until 04/27/2016 for treatment of acute GI bleed with gastric outlet obstruction, necrotic esophagitis with superficial fungal infection and ulceration with subsequent development of possible right-sided pneumonitis and aspiration pneumonia. Patient was discharged on Unasyn and completed her course on 05/14/2016.[name] Patient with left arm PICC in place since time of discharge for Unasyn and TPN administration. Patient's only complaint at this point time is shortness of breath and fevers. Denies worsening lower extremity edema, chest pain, palpitations, dysuria. Patient unable to provide further history.  Of note patient presenting in acute encephalopathic state and unable to provide reliable history. History provided by patient to a limited basis, limited nursing home notes, EMS and EDP reports.  Brief summary [name] [name] is a 81 year old female with a history of hypertension, type 2 diabetes mellitus, admitted initially on 04/15/2016 presented with dark stools, GI workup showed acute esophageal necrosis with partial gastric outlet obstruction associated with pyloric mass. Initial biopsy of pylorus and esophagus did not reveal malignancy. She was readmitted on 05/28/2016 presenting with sepsis and mental status changes found to have candidal fungemia. She had a  repeat EGD on 04/23/2016 with repeat biopsy suspicious for gastric adenocarcinoma. She bed been evaluated by general surgery for consideration of palliative gastrojejunostomy. She was also evaluated by Dr. Irene Limbo of medical oncology during this hospitalization. She was not found to be candidate for surgical intervention due to poor functional status and nutritional status, neither was she a candidate for chemotherapy. Medical oncology recommended pursuing palliative approach and possible transition to hospice. Palliative medicine was consulted. Medical goals of care were discussed, patient and family members made decision for DNR code status. The plan had been for PEG tube placement on 06/26/16. However on the morning of June 26, 2016 she had a rapid decline, becoming lethargic, minimally responsive, going into acute respiratory distress. Family members were called and updated on patient's status. Palliative care discussed [name] [name]'s prognosis and the likelihood of passing away in the next 24 hours. She was made full comfort. She past week at 11:45 AM on 06-26-16.    Likely has metabolic encephalopathy  The results of significant diagnostics from this hospitalization (including imaging, microbiology, ancillary and laboratory) are listed below for reference.    Significant Diagnostic Studies: Dg Chest 1 View  Result Date: 06/04/2016 CLINICAL DATA:  Fever. EXAM: CHEST 1 VIEW COMPARISON:  06/01/2016 FINDINGS: Interval progression of bilateral airspace disease, right greater than left. The cardio pericardial silhouette is enlarged. Previously seen left pleural effusion seems decreased in the interval. Right PICC line remains in place. Telemetry leads overlie the chest. IMPRESSION: Interval progression of vascular congestion and bilateral asymmetric airspace disease. Electronically Signed   By: Misty Stanley M.D.   On: 06/04/2016 12:43   Dg Chest 2 View  Result Date: 06/01/2016 CLINICAL DATA:  Follow-up  bilateral pleural effusions. EXAM: CHEST  2 VIEW COMPARISON:  05/30/2016, 05/24/2016 and earlier, including CT chest 04/17/2016.  FINDINGS: Suboptimal inspiration. Cardiac silhouette mildly enlarged, unchanged. Interval development of mild diffuse interstitial and airspace pulmonary edema. Bilateral pleural effusions, left greater than right, unchanged. Associated consolidation in the lower lobes, unchanged. Right arm PICC tip projects over the lower SVC, unchanged. IMPRESSION: 1. Interval development of mild CHF and/or fluid overload, with mild interstitial and airspace pulmonary edema. 2. Stable bilateral pleural effusions, left greater than right, and associated passive atelectasis and/or pneumonia in the lower lobes. Electronically Signed   By: Evangeline Dakin M.D.   On: 06/01/2016 13:33   Dg Chest 2 View  Result Date: 05/30/2016 CLINICAL DATA:  Shortness of breath. EXAM: CHEST  2 VIEW COMPARISON:  One-view chest x-ray 05/24/2016 FINDINGS: The heart is enlarged. Atherosclerotic changes are present at the aortic arch. Previously noted lines and tubes have been removed. A right-sided PICC line is in place. The tip is in the distal SVC. New left lower lobe airspace disease is present. Bilateral effusions are noted, left greater than right. There is no significant edema or focal airspace disease otherwise. The visualized soft tissues and bony thorax are otherwise unremarkable. IMPRESSION: 1. Left lower lobe airspace disease is concerning for aspiration or pneumonia. 2. Left greater right pleural effusion. 3. Cardiomegaly without edema. 4. Atherosclerosis of the thoracic aorta. 5. Right-sided PICC line. Electronically Signed   By: San Morelle M.D.   On: 05/30/2016 08:33   Dg Chest Port 1 View  Result Date: 07-07-2016 CLINICAL DATA:  Hypoxemia, shortness of breath EXAM: PORTABLE CHEST 1 VIEW COMPARISON:  06/04/2016 FINDINGS: Right-sided PICC line with the tip projecting over the upper SVC. Bilateral  interstitial and alveolar airspace opacities. Bilateral small pleural effusions. No pneumothorax. Stable cardiomegaly. Thoracic aortic atherosclerosis. No acute osseous abnormality. IMPRESSION: Bilateral interstitial and alveolar airspace opacities which may reflect pulmonary edema versus multilobar pneumonia. Electronically Signed   By: Kathreen Devoid   On: 2016/07/07 09:11   Dg Chest Port 1 View  Result Date: 05/24/2016 CLINICAL DATA:  Central line placement.  Initial encounter. EXAM: PORTABLE CHEST 1 VIEW COMPARISON:  Chest radiograph performed 04/26/2016 FINDINGS: Enteric tubes are noted extending below the diaphragm. A left IJ line is noted ending overlying the left brachiocephalic vein. Small bilateral pleural effusions noted, right greater than left. Bibasilar airspace opacities raise concern for pulmonary edema. No pneumothorax is seen. The cardiomediastinal silhouette is mildly enlarged. No acute osseous abnormalities are seen. IMPRESSION: 1. Left IJ line noted ending overlying the left brachiocephalic vein. 2. Small bilateral pleural effusions, right greater than left. Bibasilar airspace opacities raise concern for pulmonary edema. Mild cardiomegaly. Electronically Signed   By: Garald Balding M.D.   On: 05/24/2016 01:54   Dg Addison Bailey G Tube Plc W/fl W/rad  Result Date: 05/22/2016 CLINICAL DATA:  Replacement of NG tube and placement of a feeding tube. EXAM: NASO G TUBE PLACEMENT WITH FL AND WITH RAD CONTRAST:  20 cc Gastrografin FLUOROSCOPY TIME:  Fluoroscopy Time:  20 minutes Radiation Exposure Index (if provided by the fluoroscopic device): 5341.6 mGy Number of Acquired Spot Images: 0 COMPARISON:  CT scan 04/17/2016 FINDINGS: The radiology technologist Baxter Flattery Dingus). Remove the NG tube which was coronal back on itself in the esophagus and replace and NG tube into the stomach. She also advanced a feeding tube into the stomach but could not advance it into the duodenum. I could not advance it into the  duodenum either. IMPRESSION: NG tube and feeding tubes in the stomach. The feeding tube could not be advanced into the duodenum.  Electronically Signed   By: Marijo Sanes M.D.   On: 05/22/2016 11:39    Microbiology: Recent Results (from the past 240 hour(s))  Culture, blood (routine x 2)     Status: None (Preliminary result)   Collection Time: 06/12/16 12:30 PM  Result Value Ref Range Status   Specimen Description BLOOD RIGHT HAND  Final   Special Requests IN PEDIATRIC BOTTLE 2CC  Final   Culture NO GROWTH 4 DAYS  Final   Report Status PENDING  Incomplete  Culture, blood (routine x 2)     Status: None (Preliminary result)   Collection Time: 06/12/16 12:51 PM  Result Value Ref Range Status   Specimen Description BLOOD LEFT THUMB  Final   Special Requests IN PEDIATRIC BOTTLE 2CC  Final   Culture NO GROWTH 4 DAYS  Final   Report Status PENDING  Incomplete     Labs: Basic Metabolic Panel:  Recent Labs Lab 06/11/16 0515 06/21/16 0513  NA 138 142  K 3.8 3.5  CL 103 104  CO2 31 33*  GLUCOSE 163* 188*  BUN 30* 41*  CREATININE 0.70 0.83  CALCIUM 8.2* 8.3*  MG 1.9  --   PHOS 3.4  --    Liver Function Tests:  Recent Labs Lab 06/11/16 0515  AST 36  ALT 27  ALKPHOS 157*  BILITOT 0.5  PROT 5.7*  ALBUMIN 1.8*   No results for input(s): LIPASE, AMYLASE in the last 168 hours. No results for input(s): AMMONIA in the last 168 hours. CBC:  Recent Labs Lab 06/11/16 0515 2016-06-21 0513  WBC 16.4* 14.7*  NEUTROABS 14.5*  --   HGB 9.0* 7.4*  HCT 29.5* 24.7*  MCV 84.8 85.5  PLT 197 232   Cardiac Enzymes: No results for input(s): CKTOTAL, CKMB, CKMBINDEX, TROPONINI in the last 168 hours. D-Dimer No results for input(s): DDIMER in the last 72 hours. BNP: Invalid input(s): POCBNP CBG:  Recent Labs Lab 06/12/16 1702 06/12/16 2005 2016/06/21 0004 06-21-2016 0506 June 21, 2016 0810  GLUCAP 131* 179* 216* 175* 160*   Anemia work up No results for input(s): VITAMINB12,  FOLATE, FERRITIN, TIBC, IRON, RETICCTPCT in the last 72 hours. Urinalysis    Component Value Date/Time   COLORURINE YELLOW 06/04/2016 1200   APPEARANCEUR CLOUDY (A) 06/04/2016 1200   LABSPEC 1.023 06/04/2016 1200   PHURINE 6.0 06/04/2016 1200   GLUCOSEU NEGATIVE 06/04/2016 1200   HGBUR NEGATIVE 06/04/2016 1200   BILIRUBINUR SMALL (A) 06/04/2016 1200   KETONESUR NEGATIVE 06/04/2016 1200   PROTEINUR 100 (A) 06/04/2016 1200   NITRITE POSITIVE (A) 06/04/2016 1200   LEUKOCYTESUR MODERATE (A) 06/04/2016 1200   Sepsis Labs Invalid input(s): PROCALCITONIN,  WBC,  LACTICIDVEN     SIGNED:  Kelvin Cellar, MD  [facility] 06/16/2016, 5:28 PM Pager   If 7PM-7AM, please contact night-coverage [email or web address] Password [email or web address]

## 2016-06-17 NOTE — Progress Notes (Signed)
[place] NOTE  Pharmacy Consult for TPN Indication: Gastric outlet obstruction  Allergies  Allergen Reactions  . Ace Inhibitors Cough  . Prednisone Other (See Comments)    LETHARGY  . Sitagliptin Other (See Comments)    Noted on [facility]    Patient Measurements: Height: '5\' 2"'$  (157.5 cm) Weight: 170 lb 3.1 oz (77.2 kg) IBW/kg (Calculated) : 50.1 Adjusted Body Weight: 75 Usual Weight: 80  Vital Signs: Temp: 97.9 F (36.6 C) (09/27 0451) Temp Source: Oral (09/27 0451) BP: 131/73 (09/27 0451) Pulse Rate: 113 (09/27 0516) Intake/Output from previous day: 09/26 0701 - 09/27 0700 In: 1294 [I.V.:1244; IV Piggyback:50] Out: 5329 [Urine:1725] Intake/Output from this shift: Total I/O In: -  Out: 50 [Urine:50]  Labs:  Recent Labs  06/11/16 0515 06/12/16 1053 06/17/16 0513  WBC 16.4*  --  14.7*  HGB 9.0*  --  7.4*  HCT 29.5*  --  24.7*  PLT 197  --  232  APTT  --  39*  --   INR  --  1.38  --      Recent Labs  06/11/16 0515 06/11/16 0516 06/17/16 0513  NA 138  --  142  K 3.8  --  3.5  CL 103  --  104  CO2 31  --  33*  GLUCOSE 163*  --  188*  BUN 30*  --  41*  CREATININE 0.70  --  0.83  CALCIUM 8.2*  --  8.3*  MG 1.9  --   --   PHOS 3.4  --   --   PROT 5.7*  --   --   ALBUMIN 1.8*  --   --   AST 36  --   --   ALT 27  --   --   ALKPHOS 157*  --   --   BILITOT 0.5  --   --   PREALBUMIN 7.9*  --   --   TRIG  --  73  --    Estimated Creatinine Clearance: 53.7 mL/min (by C-G formula based on SCr of 0.83 mg/dL).    Recent Labs  Jun 17, 2016 0004 06/17/2016 0506 06-17-2016 0810  GLUCAP 216* 175* 160*    Medications:  Scheduled:  . antiseptic oral rinse  7 mL Mouth Rinse q12n4p  . cefTRIAXone (ROCEPHIN)  IV  2 g Intravenous Q24H  . chlorhexidine  15 mL Mouth Rinse BID  . enoxaparin (LOVENOX) injection  40 mg Subcutaneous Q24H  . insulin aspart  0-15 Units Subcutaneous Q4H  . ondansetron (ZOFRAN) IV  4 mg Intravenous Q8H  . phenol  1 spray  Mouth/Throat BID  . sodium chloride flush  10-40 mL Intracatheter Q12H  . sodium chloride flush  3 mL Intravenous Q12H    Insulin Requirements in the past 24 hours:  19units moderate SSI + 40 units regular insulin in TPN  Assessment: 19 YOF recently admitted to [facility] with gastric outlet obstruction due to necrotic esophagitis and discharged on home Unasyn and TPN to [facility]. Admitted to [facility] 04/25/2016 with shortness of breath, fever, and confusion. PICC line was removed on admission due to concern for line infection - found to have fungemia. She is to continue nutrition support with TPN.  GI: Pepcid in TPN. IR unable to place postpyloric tube d/t obstruction. Biopsy inconclusive but likely adenocarcinoma. Pt expected to get a vented Gtube to allow pt to drink. Pt has been having emesis. Prealbumin 3.5 >7.9, slow rise d/t inflammation.  Zofran q8  d/t increased emesis. Endo: hx DM - cbgs 130-212, have been quite variable and required frequent insulin adjustment in TPN. Lytes: hyponatremia prior to TPN initiation. Na added to TPN since 9/10 per MD request - Na 142, CoCa 10, Mg 1.9, Ca x Phos = 34. Other lytes WNL.  Renal: SCr stable, BUN 30, net+12L since admit. UOP 0.9 ml/kg/hr Cards: BP soft-high and hr wnl-tachy- Lasix Hepatobil: alk phos elevated, LFTs/Tbli wnl, TG 73  Pulm: up to [place]-3L, sats 90-100s ID: Ceftriaxone (day#10 of abx) for Kleb bacteremia. S/p 14d [name] for C.glabrata fungemia- WBC 14.7, currently AFeb Neuro:  Rapid response 9/22 afternoon d/t change in LOC after Promethazine  Best Practices: SCDs TPN Access: PICC replaced 05/24/16 TPN start date: on TPN PTA (since 04/19/16)  TPN at [facility]: (information obtained on 8/30 from [facility] - pharmacist at [place]) Clinimix E 5/15 1440 ml + 20% lipids 192 ml+ 35 units of regular insulin   Current Nutrition: TPN  Nutritional Goals: 1500-1700 kCal/day 70-80 g protein per day Per RD  9/26  Plan: - Continue cyclic Clinimix E 0/86, infuse 1514m over 18 hrs: 50 ml/hr x 1 hr, then 91 ml/hr x 16 hrs, then 50 ml/hr x 1 hr - Lipids 13 ml/hr x 18 hrs - TPN provides approximately 1588 kCal and 78 gm of protein per day, meeting 100% of patient's needs. - Daily multivitamin and trace elementsin TPN - Continue moderateSSI Q4H + 40 units of regular insulin in TPN - Pepcid '40mg'$  in TPN - Continue to add Na 635m NaCL to make Clinimix 1/2NS concentration (sodium content 7751mL) - Initially was trying to cycle tpn over 12-16 hours; however due to condition of patient + emesis, will leave over 18 hours for now - TPN labs qMon/Thurs - KCl 10 mEq x 3    AliHughes BetterharmD, BCPS Clinical Pharmacist 9/210/07/[id number] AM

## 2016-06-17 NOTE — Progress Notes (Signed)
Per Palliative NP nurse order, patient received 2mg  Ativan IV and 0.4mg  Robinol IV @10 :53. Will continue to monitor.

## 2016-06-17 NOTE — Progress Notes (Signed)
Per MD order, staff started to administer Versed IV 2mg /hr@11 :35 and Morphine IV 4mg /hr@11 :36. After 6 minutes, patient passed away 11:42. IV was stopped and 250cc Versed and 250cc Morphine were wasted by Ramonita Lab charge nurse and Alphonsus Sias, RN in the sink. Family at bedside in this moment, Palliative Care nurse and MD was made aware.

## 2016-06-17 NOTE — Progress Notes (Signed)
Patient's body was transported to the morgue by nurse tech. Bed placement was informed.

## 2016-06-17 NOTE — Progress Notes (Signed)
At 10:53 NP ordered 1mg  Ativan IV but she changed the order to 2mg  Ativan IV. RN wasted already 1 mg Ativan in the sink (witnessed by Ginger Gleason, RN). The dose of Ativan 1mg  which was ready to be administered to the patient was wasted also in the sink (witnessed by Ginger Gleason).

## 2016-06-17 DEATH — deceased

## 2017-01-20 IMAGING — CT CT ABD-PELV W/ CM
2 of 5 series · 14 of 36 positions shown, 17 images · IV contrast (iopamidol)
Comparison: Chest radiographs yesterday. No prior chest CT. CT
abdomen/pelvis 08/04/2012

CLINICAL DATA: Black esophagus on EGD today.  Rule out necrosis.

EXAM:
CT CHEST, ABDOMEN, AND PELVIS WITH CONTRAST
TECHNIQUE: Multidetector CT imaging of the chest, abdomen and pelvis was
performed following the standard protocol during bolus
administration of intravenous contrast.
CONTRAST:  100mL J1POJ5-0LL IOPAMIDOL (J1POJ5-0LL) INJECTION 61%

[Series 2: chest with st · axial · 0.76mm/px · z∈[-286,+236]mm · 11 of 297 slices shown, 14 images]
[im 18/297  mediastinal]
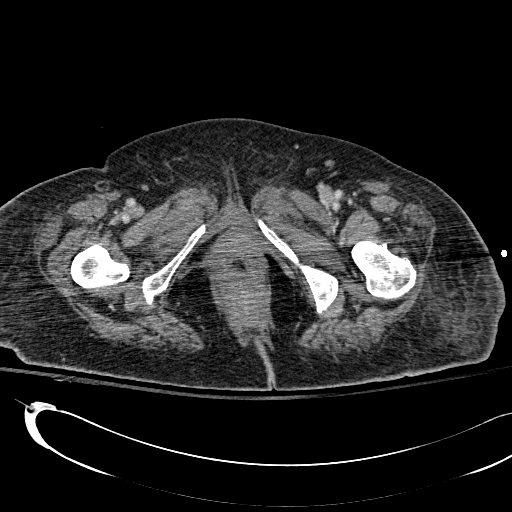
[im 18/297  lung]
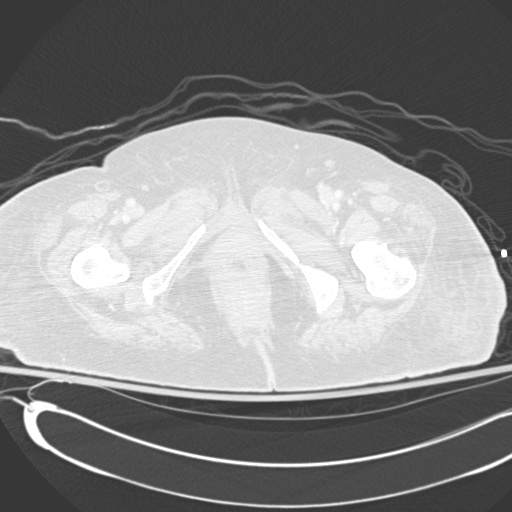
[im 53/297  lung]
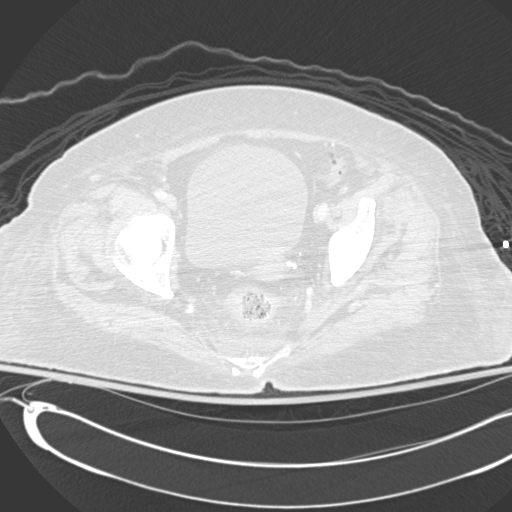
[im 70/297  lung]
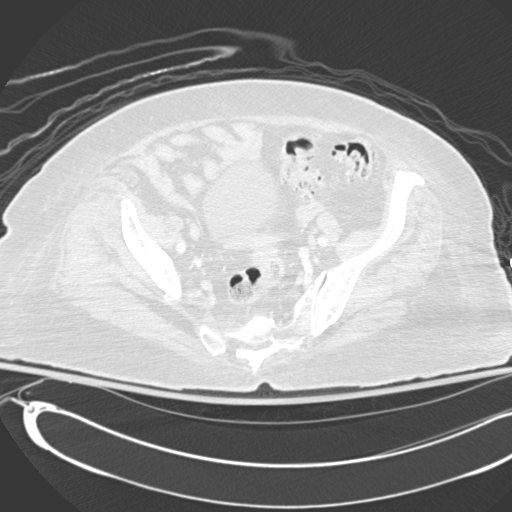
[im 105/297  lung]
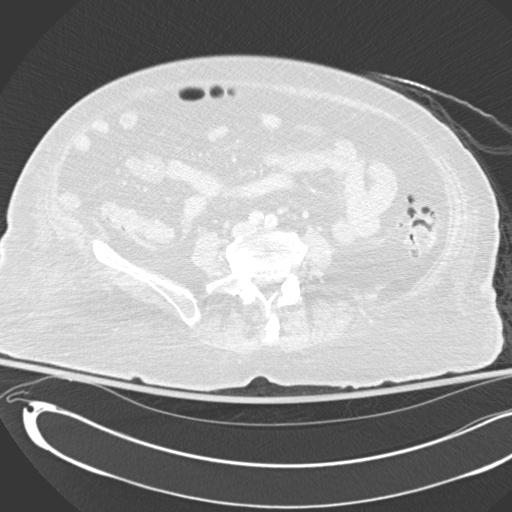
[im 122/297  mediastinal]
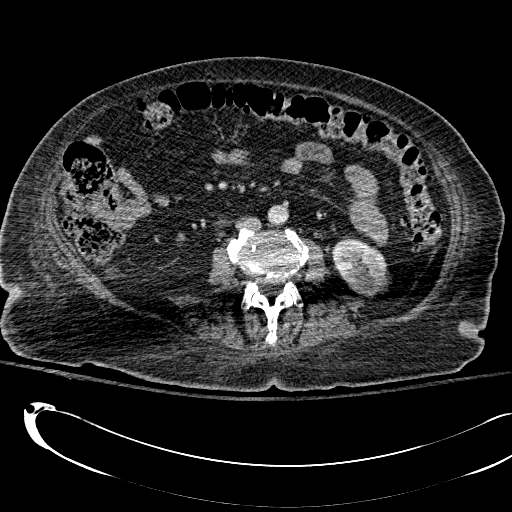
[im 122/297  lung]
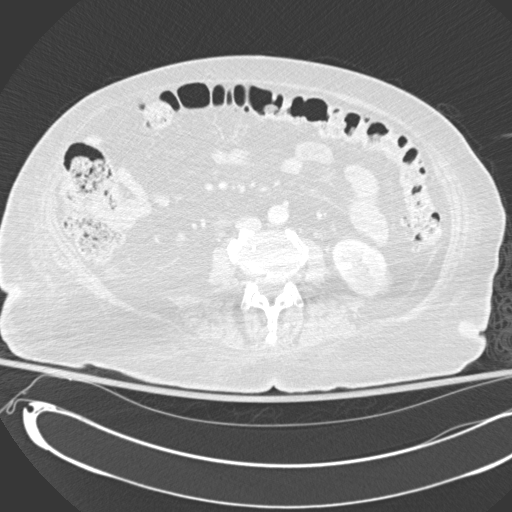
[im 157/297  lung]
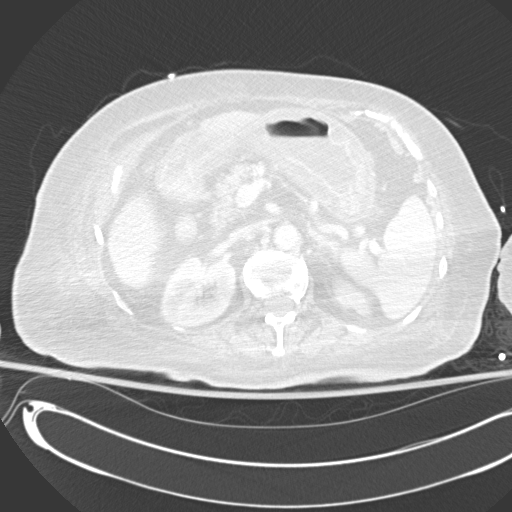
[im 175/297  lung]
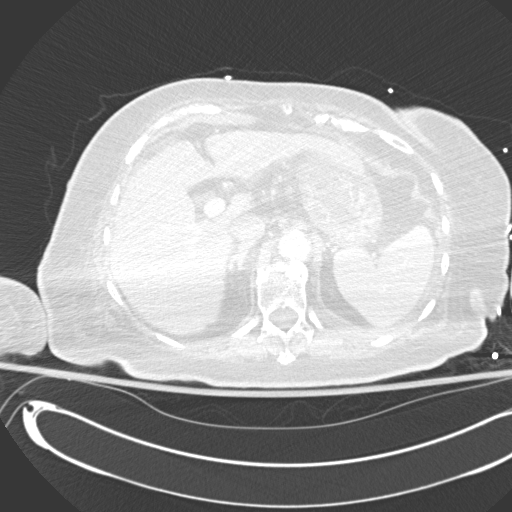
[im 192/297  lung]
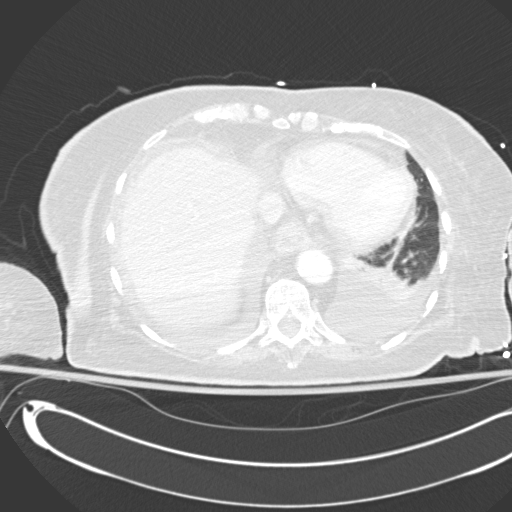
[im 227/297  mediastinal]
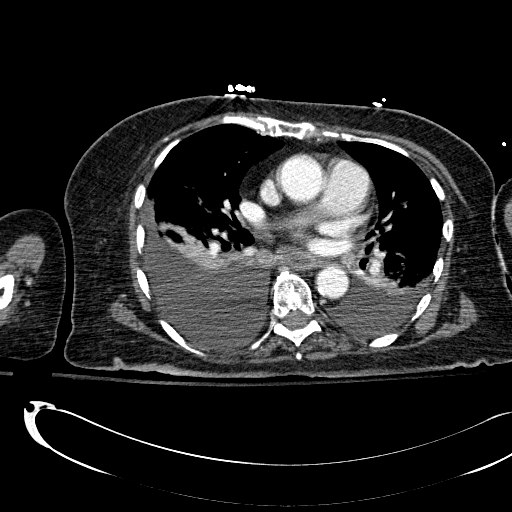
[im 227/297  lung]
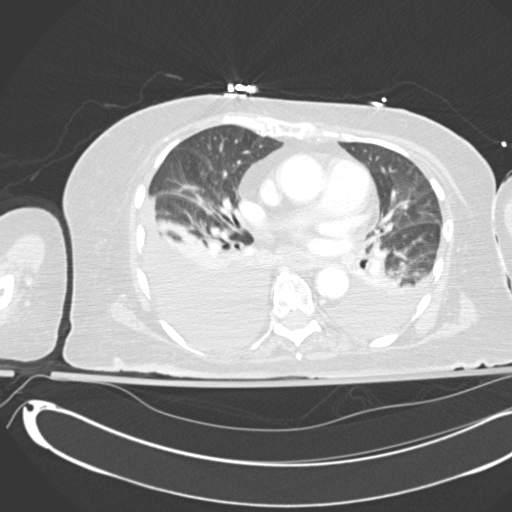
[im 244/297  lung]
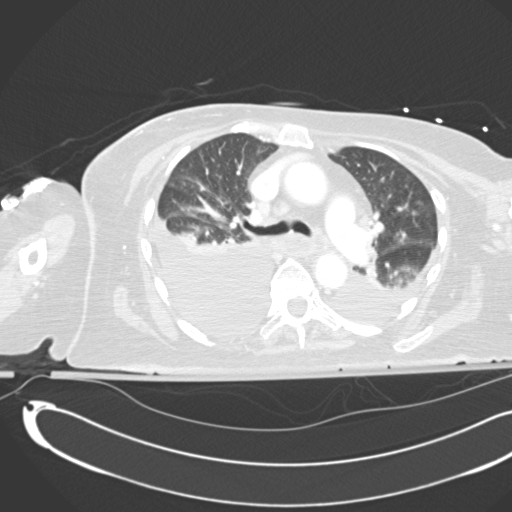
[im 279/297  lung]
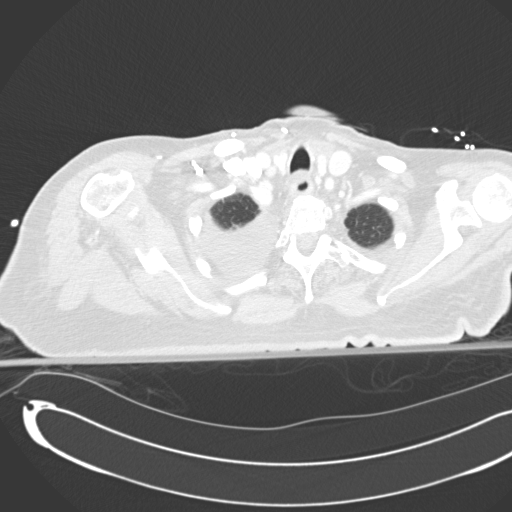

[Series 602: <mpr thick range> · coronal · 1.16mm/px · 3 of 125 slices shown]
[im 25/125  lung]
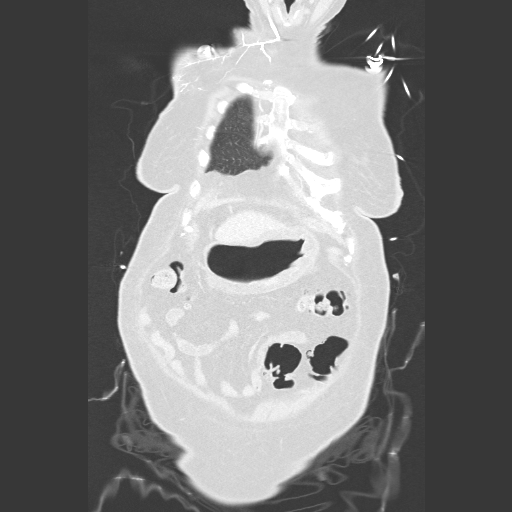
[im 50/125  lung]
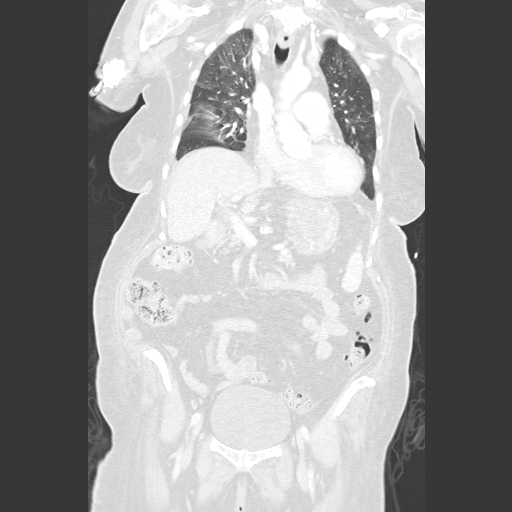
[im 75/125  lung]
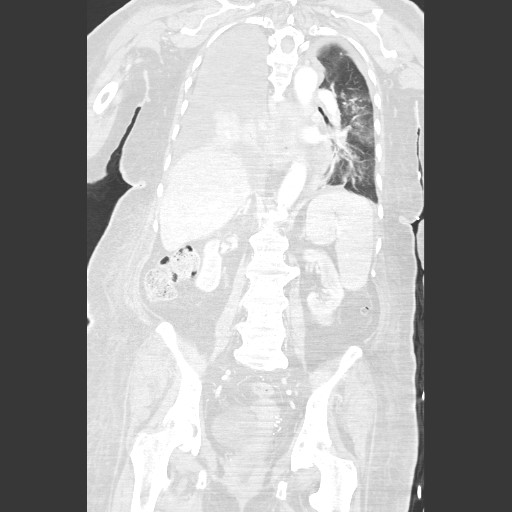

[14 of 36 positions shown; findings below may reference images not displayed]

FINDINGS: CT CHEST FINDINGS

Cardiovascular: Tortuosity of the thoracic aorta without aneurysm.
There is moderate atherosclerosis. Conventional branching pattern
from the aortic arch. No aortic dissection. Scattered coronary
artery calcifications. Heart is normal in size.

Mediastinum/Nodes: There is diffuse esophageal wall thickening from
the thoracic inlet through the diaphragmatic hiatus. Intraluminal
esophageal fluid. No evidence of pneumatosis. No pneumomediastinum.
Small amount of paraesophageal fluid distally may be contiguous with
right pleural effusion. Prominent right upper paratracheal node
measures 10 mm short axis. Small lower paratracheal nodes not
enlarged by size criteria. No evidence of hilar adenopathy. No
axillary adenopathy. No pericardial effusion.

Lungs/Pleura: Moderate bilateral pleural effusions, right greater
than left. There is adjacent compressive atelectasis in both lower
lobes. Mild patchy opacities in the anterior right upper lobe, left
upper lobe to a lesser extent and superior segment left lower lobe.
There is minimal smooth septal thickening. Trachea and mainstem
bronchi are patent.

Musculoskeletal: There are no acute or suspicious osseous
abnormalities.

CT ABDOMEN PELVIS FINDINGS

Hepatobiliary: No focal hepatic lesion. Streak artifact from arms
down positioning. Question of nodular hepatic contours.
Postcholecystectomy. No biliary dilatation.

Pancreas: Atrophic parenchyma. No ductal dilatation or inflammation.

Spleen: Unremarkable.

Adrenals/Urinary Tract: No adrenal nodule. Symmetric renal
enhancement. No hydronephrosis. Motion artifact through the lower
left kidney. Contour deformity is likely secondary to lobular
contours and motion, less likely focal renal lesion. Urinary bladder
is physiologically distended.

Stomach/Bowel: Diffuse wall thickening and edematous appearance.
Small volume of intraluminal gastric fluid. No pneumatosis. Small
bowel is decompressed. No definite small bowel thickening. Extensive
colonic diverticulosis of the descending and sigmoid colon without
acute diverticulitis. Scattered colonic diverticular throughout the
remainder the colon. Appendix tentatively identified and normal.
There is mild wall thickening at the rectosigmoid junction with
adjacent perirectal fat stranding.

Vascular/Lymphatic: Abdominal aortic atherosclerosis. No aneurysm.
Circumaortic left renal vein. No retroperitoneal adenopathy.

Reproductive: Uterus is atrophic, normal for age.  No adnexal mass.

Other: Small fat containing upper ventral abdominal wall hernia.
Small amount of ascites adjacent to the liver in the spleen. There
is no free air. No intra-abdominal abscess. There is whole body wall
edema.

Musculoskeletal: Degenerative change throughout spine. There are no
acute or suspicious osseous abnormalities.
IMPRESSION: 1. Esophagus is diffusely edematous with diffuse wall thickening and
intraluminal fluid. No pneumatosis or pneumomediastinum. No CT
findings to suggest necrosis. Similar edematous appearance of the
stomach.
2. Moderate bilateral pleural effusions with adjacent compressive
atelectasis. Minimal smooth septal thickening may reflect pulmonary
edema.
3. Contour deformity of the lower left kidney likely related to
lobular contours and motion, difficult to exclude exophytic lesion.
Nonemergent renal ultrasound could be considered when patient is
able.
4. Rather extensive colonic diverticulosis, no diverticulitis.
Rectus sigmoid colonic wall thickening with adjacent edema,
suggesting chronic constipation.
5. Question of nodular hepatic contours raising concern for
cirrhosis.
6. Whole body wall edema.  Minimal abdominal ascites.
7. Thoracoabdominal aortic atherosclerosis. Coronary artery
calcifications.

## 2017-03-18 IMAGING — CR DG CHEST 1V PORT
1 series · 1 of 1 positions shown · non-contrast
Comparison: 06/04/2016

CLINICAL DATA: Hypoxemia, shortness of breath

EXAM:
PORTABLE CHEST 1 VIEW

[AP]
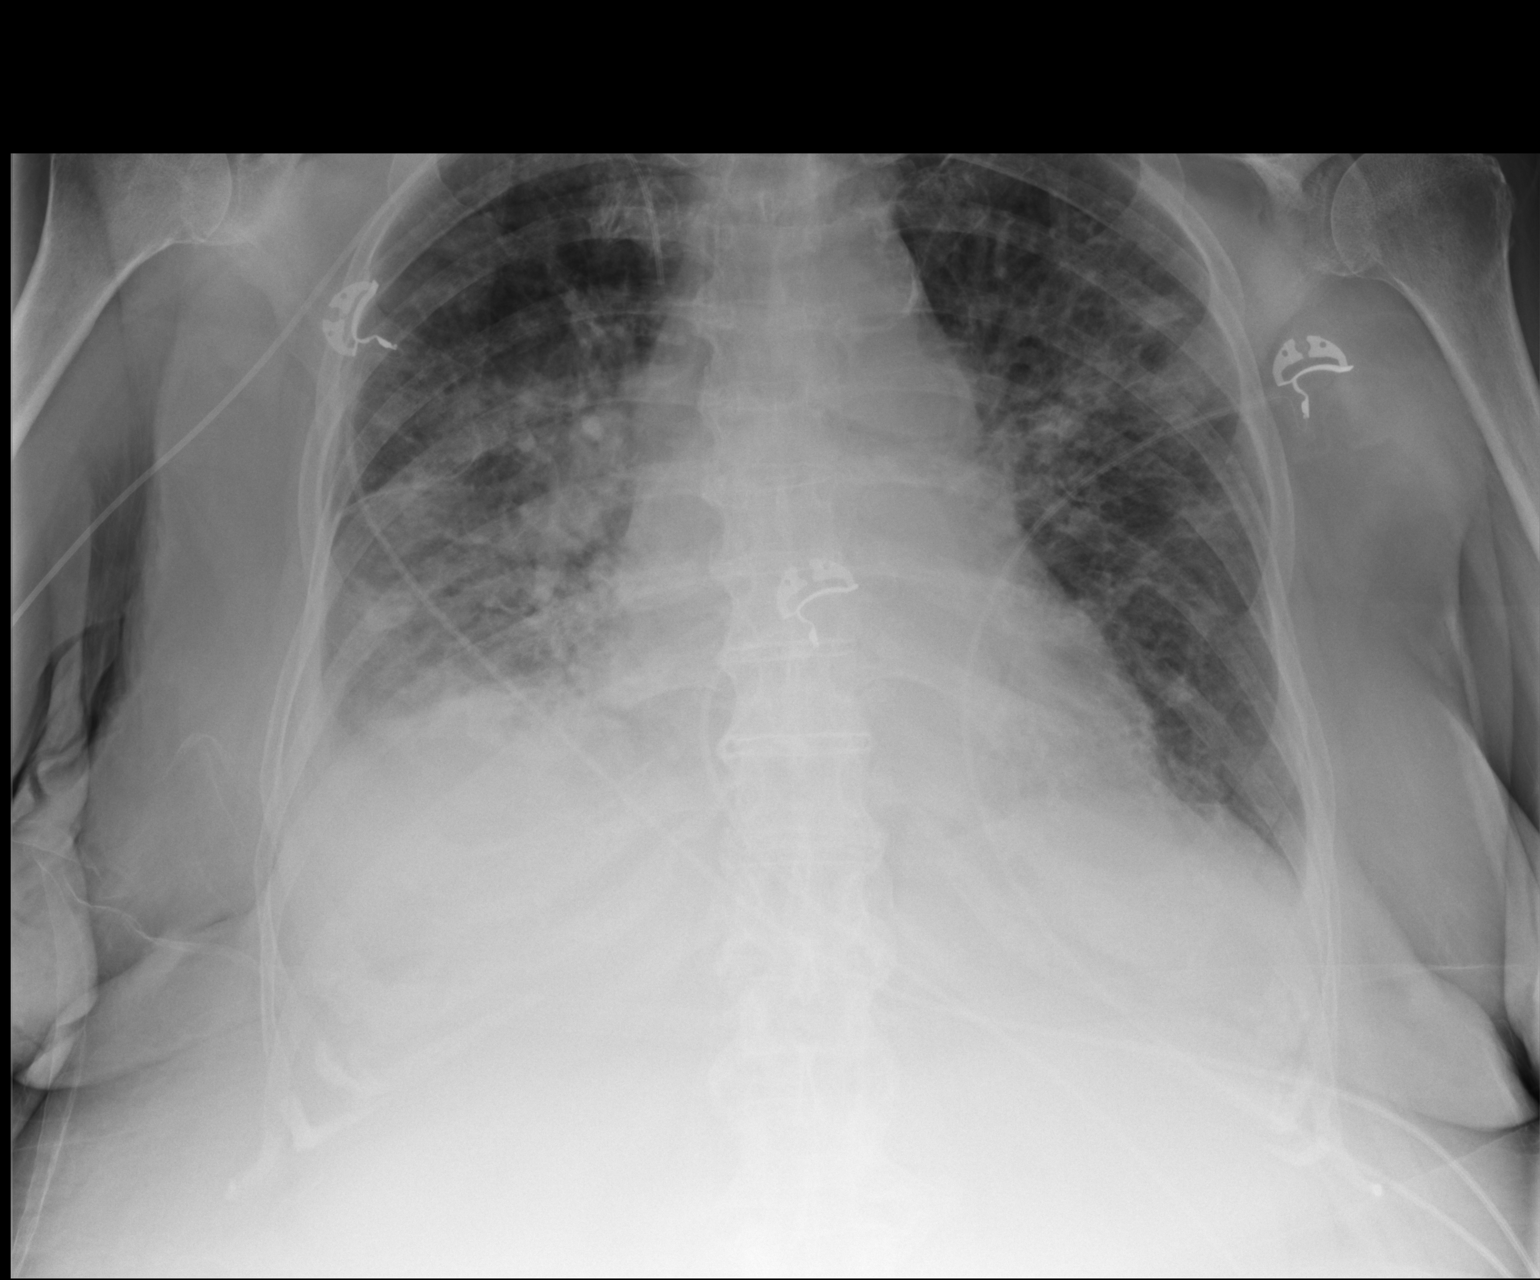

[1 of 1 positions shown; findings below may reference images not displayed]

FINDINGS: Right-sided PICC line with the tip projecting over the upper SVC.

Bilateral interstitial and alveolar airspace opacities. Bilateral
small pleural effusions. No pneumothorax. Stable cardiomegaly.
Thoracic aortic atherosclerosis.

No acute osseous abnormality.
IMPRESSION: Bilateral interstitial and alveolar airspace opacities which may
reflect pulmonary edema versus multilobar pneumonia.
